# Patient Record
Sex: Female | Born: 1945
Health system: Southern US, Community
[De-identification: ages and names within clinical notes are randomized; demographics above are authoritative.]

## PROBLEM LIST (undated history)

## (undated) DIAGNOSIS — R011 Cardiac murmur, unspecified: Secondary | ICD-10-CM

## (undated) DIAGNOSIS — I739 Peripheral vascular disease, unspecified: Secondary | ICD-10-CM

## (undated) DIAGNOSIS — R32 Unspecified urinary incontinence: Secondary | ICD-10-CM

## (undated) DIAGNOSIS — D013 Carcinoma in situ of anus and anal canal: Secondary | ICD-10-CM

## (undated) DIAGNOSIS — J449 Chronic obstructive pulmonary disease, unspecified: Secondary | ICD-10-CM

## (undated) DIAGNOSIS — K529 Noninfective gastroenteritis and colitis, unspecified: Secondary | ICD-10-CM

## (undated) DIAGNOSIS — H47012 Ischemic optic neuropathy, left eye: Secondary | ICD-10-CM

## (undated) DIAGNOSIS — K222 Esophageal obstruction: Secondary | ICD-10-CM

## (undated) DIAGNOSIS — I700 Atherosclerosis of aorta: Secondary | ICD-10-CM

## (undated) DIAGNOSIS — M1990 Unspecified osteoarthritis, unspecified site: Secondary | ICD-10-CM

## (undated) DIAGNOSIS — K5792 Diverticulitis of intestine, part unspecified, without perforation or abscess without bleeding: Secondary | ICD-10-CM

## (undated) DIAGNOSIS — I10 Essential (primary) hypertension: Secondary | ICD-10-CM

## (undated) DIAGNOSIS — K5790 Diverticulosis of intestine, part unspecified, without perforation or abscess without bleeding: Secondary | ICD-10-CM

## (undated) DIAGNOSIS — R739 Hyperglycemia, unspecified: Secondary | ICD-10-CM

## (undated) DIAGNOSIS — K649 Unspecified hemorrhoids: Secondary | ICD-10-CM

## (undated) DIAGNOSIS — J189 Pneumonia, unspecified organism: Secondary | ICD-10-CM

## (undated) HISTORY — DX: Cardiac murmur, unspecified: R01.1

## (undated) HISTORY — DX: Noninfective gastroenteritis and colitis, unspecified: K52.9

## (undated) HISTORY — DX: Esophageal obstruction: K22.2

## (undated) HISTORY — DX: Unspecified urinary incontinence: R32

## (undated) HISTORY — DX: Diverticulitis of intestine, part unspecified, without perforation or abscess without bleeding: K57.92

## (undated) HISTORY — DX: Hyperglycemia, unspecified: R73.9

## (undated) HISTORY — DX: Diverticulosis of intestine, part unspecified, without perforation or abscess without bleeding: K57.90

## (undated) HISTORY — DX: Chronic obstructive pulmonary disease, unspecified: J44.9

## (undated) HISTORY — PX: ABDOMINAL HYSTERECTOMY: SHX81

## (undated) HISTORY — DX: Carcinoma in situ of anus and anal canal: D01.3

## (undated) HISTORY — DX: Peripheral vascular disease, unspecified: I73.9

## (undated) HISTORY — DX: Unspecified osteoarthritis, unspecified site: M19.90

## (undated) HISTORY — DX: Atherosclerosis of aorta: I70.0

---
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
## 2000-08-23 ENCOUNTER — Emergency Department (HOSPITAL_COMMUNITY): Admission: EM | Admit: 2000-08-23 | Discharge: 2000-08-23 | Payer: Self-pay | Admitting: Emergency Medicine

## 2005-12-14 ENCOUNTER — Ambulatory Visit: Payer: Self-pay | Admitting: Oncology

## 2006-01-29 ENCOUNTER — Ambulatory Visit: Payer: Self-pay | Admitting: Oncology

## 2006-02-03 ENCOUNTER — Ambulatory Visit (HOSPITAL_COMMUNITY): Admission: RE | Admit: 2006-02-03 | Discharge: 2006-02-03 | Payer: Self-pay | Admitting: Oncology

## 2006-02-03 IMAGING — CR DG CHEST 2V
2 series · 2 of 2 positions shown · non-contrast
Comparison: none

CLINICAL DATA: Elevated blood count.
 PA AND LATERAL CHEST ? 2 VIEW:

[view not recorded (1 of 2)]
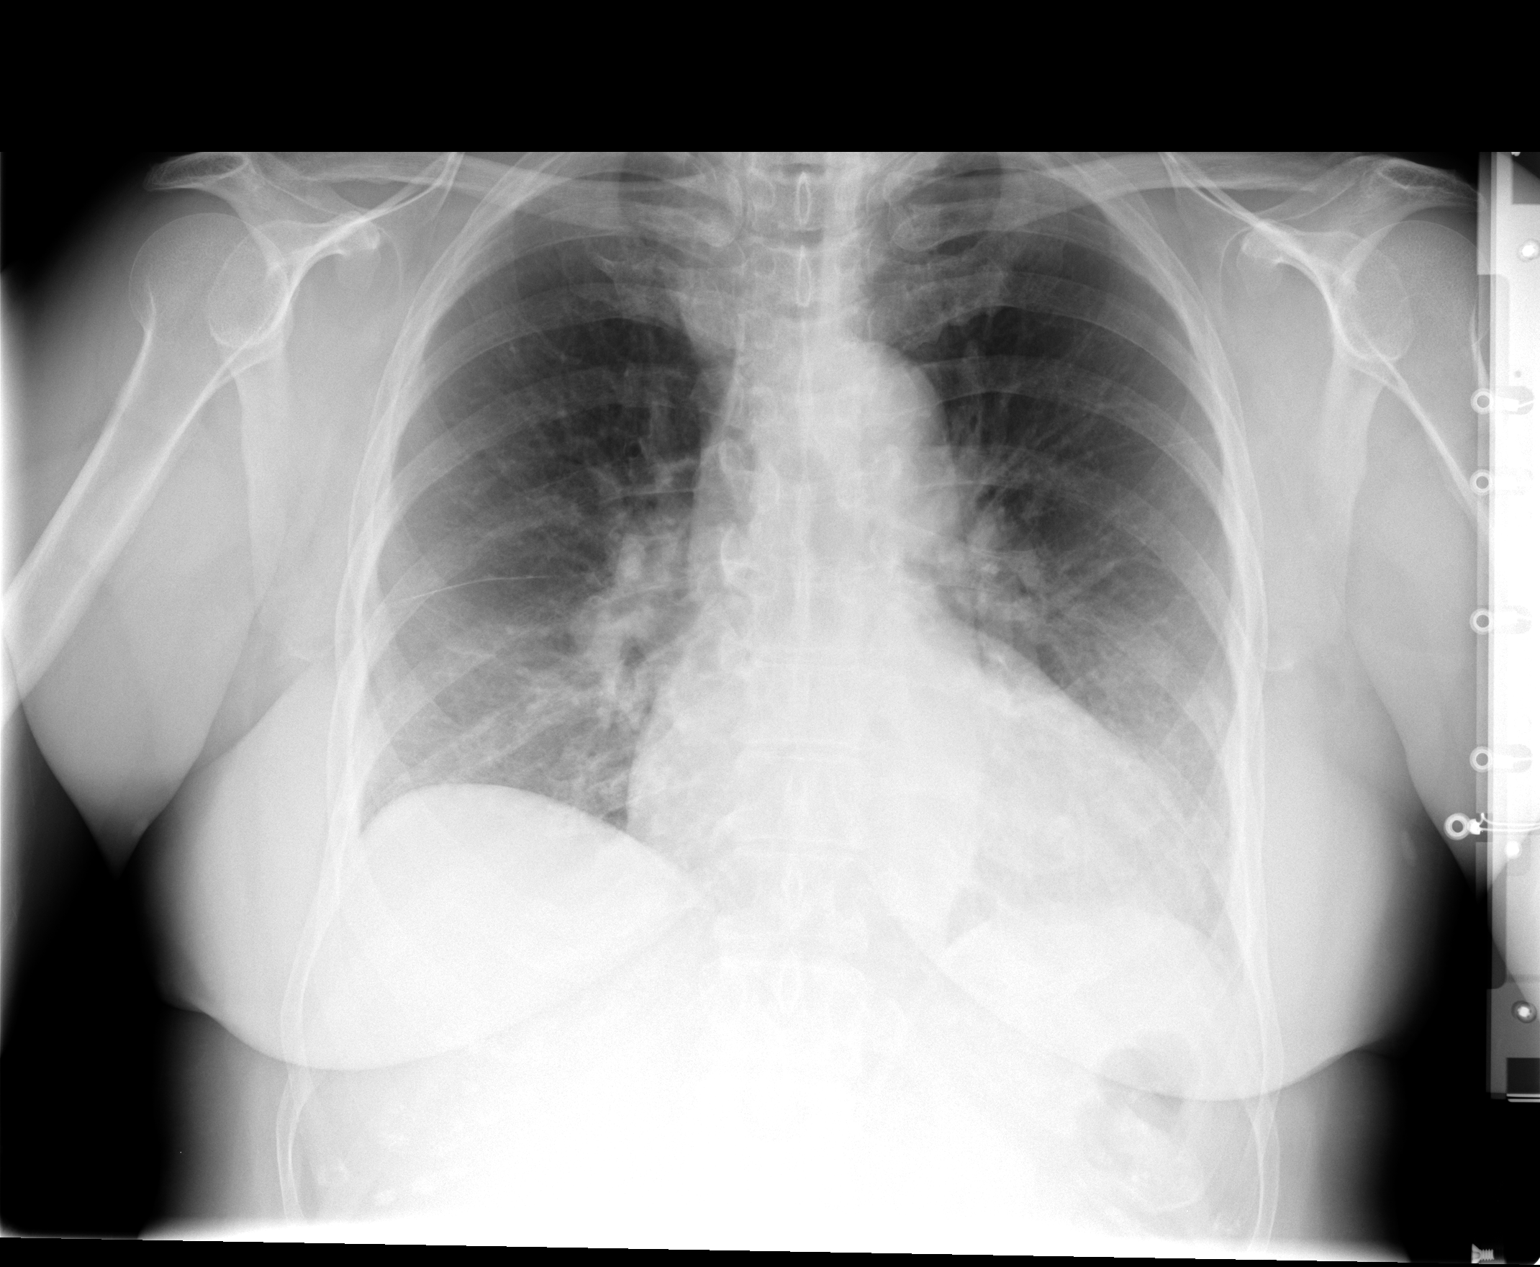

[view not recorded (2 of 2)]
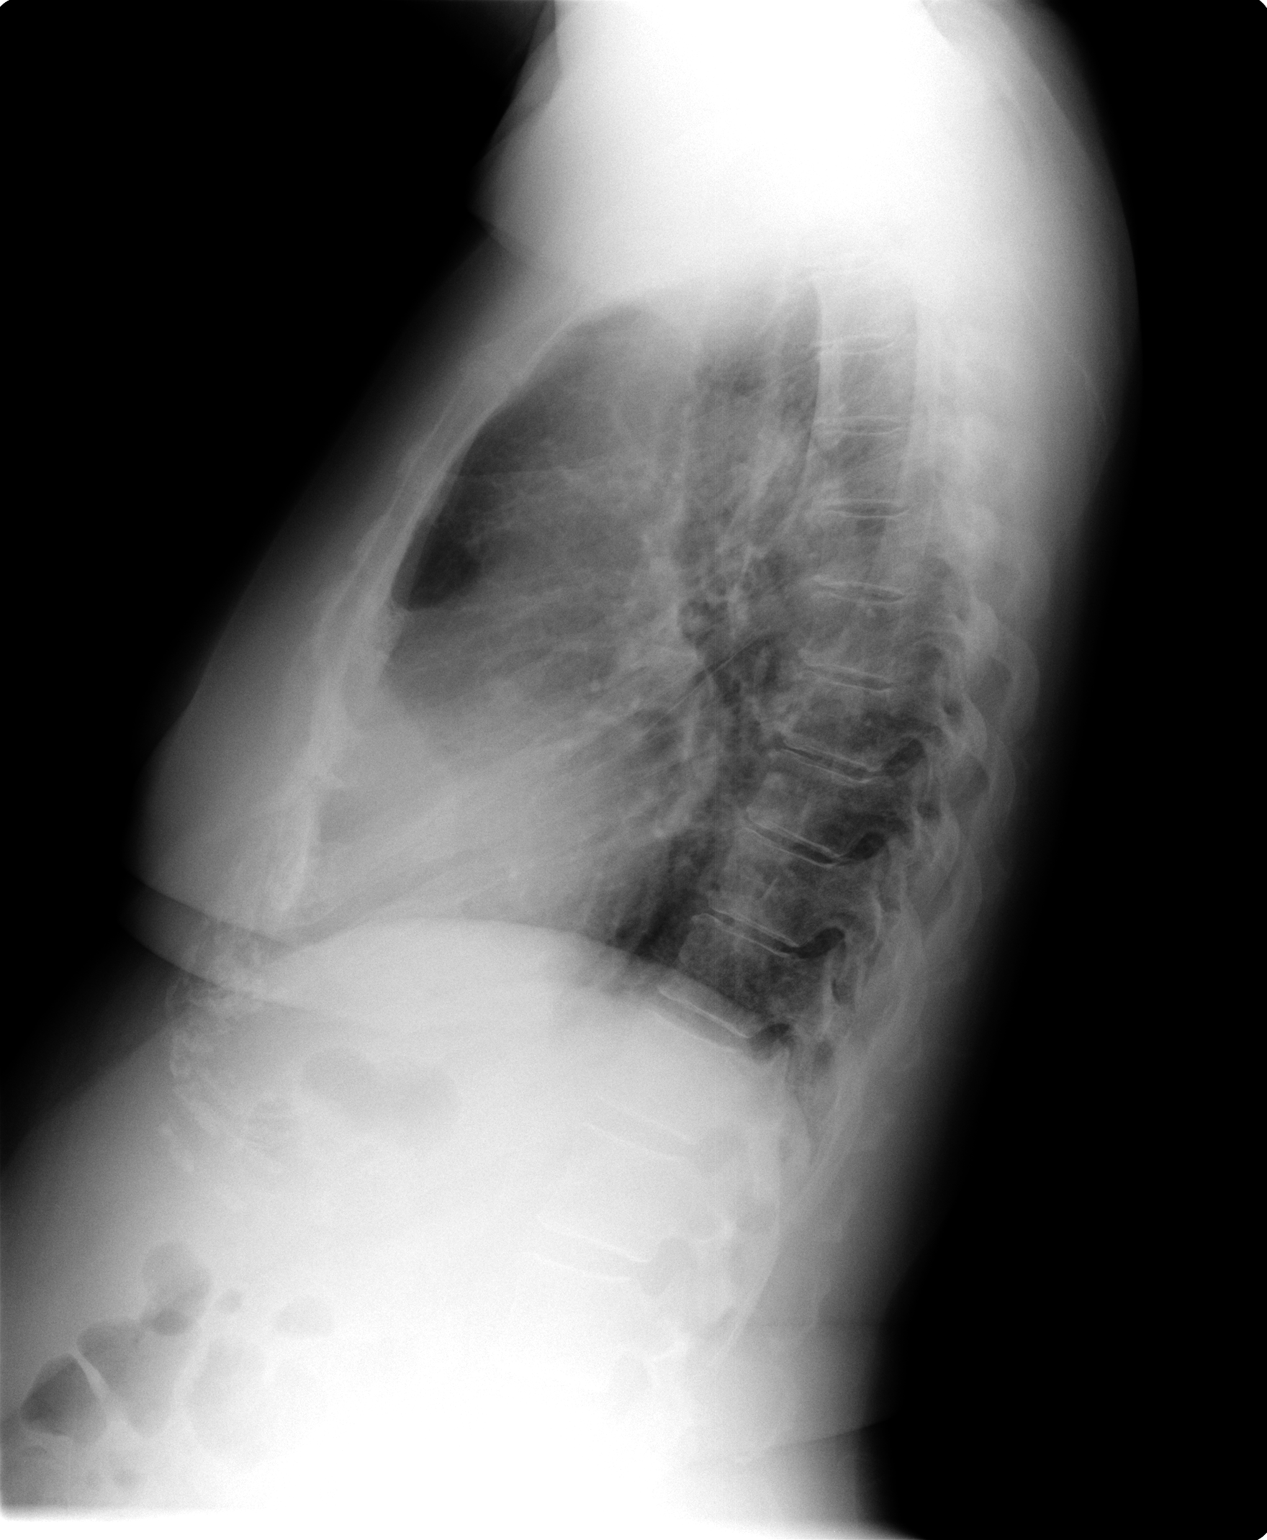

[2 of 2 positions shown; findings below may reference images not displayed]

FINDINGS: There is cardiomegaly with tortuosity of the thoracic aorta.  Pulmonary vascularity is at the upper limits of normal.  There is some accentuation of the interstitial markings at both lung bases.  No discrete consolidative infiltrates or effusions.  I suspect this is some mild chronic interstitial lung disease.  No significant bony abnormality.
IMPRESSION: Cardiomegaly with probable mild chronic interstitial disease at the lung bases.

## 2006-02-03 IMAGING — US US ABDOMEN COMPLETE
1 series · 14 of 25 positions shown · non-contrast
Comparison: none

CLINICAL DATA: Elevated white count.  
 ABDOMEN ULTRASOUND:
TECHNIQUE: Complete abdominal ultrasound examination was performed including evaluation of the liver, gallbladder, bile ducts, pancreas, kidneys, spleen, IVC, and abdominal aorta.

[Series 1: unknown · 0.33mm/px · 14 of 95 slices shown]
[im 1/95]
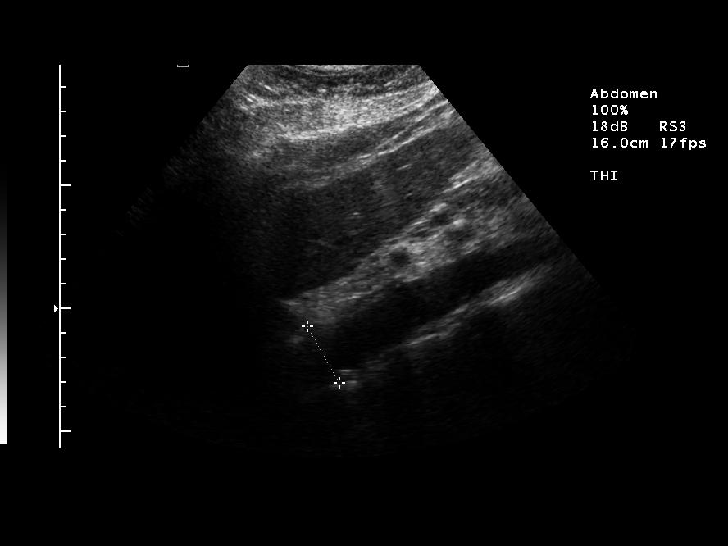
[im 8/95]
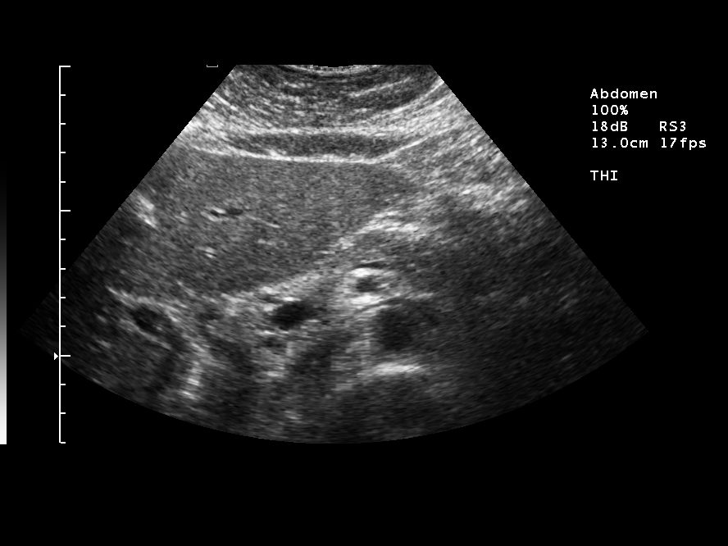
[im 16/95]
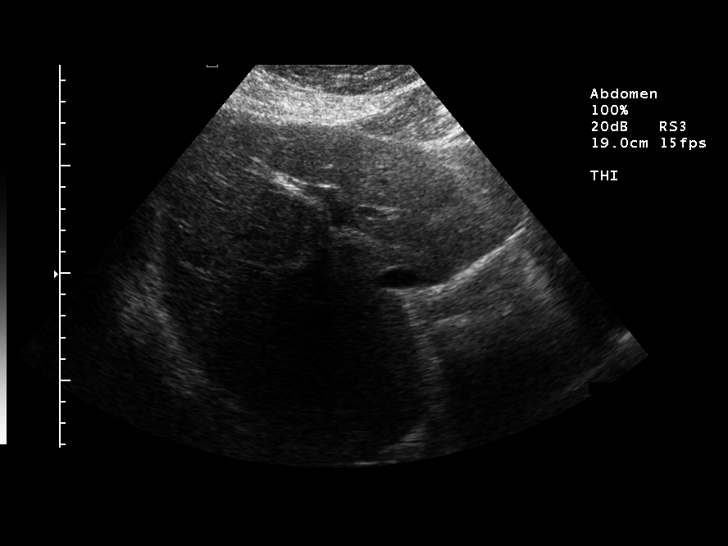
[im 24/95]
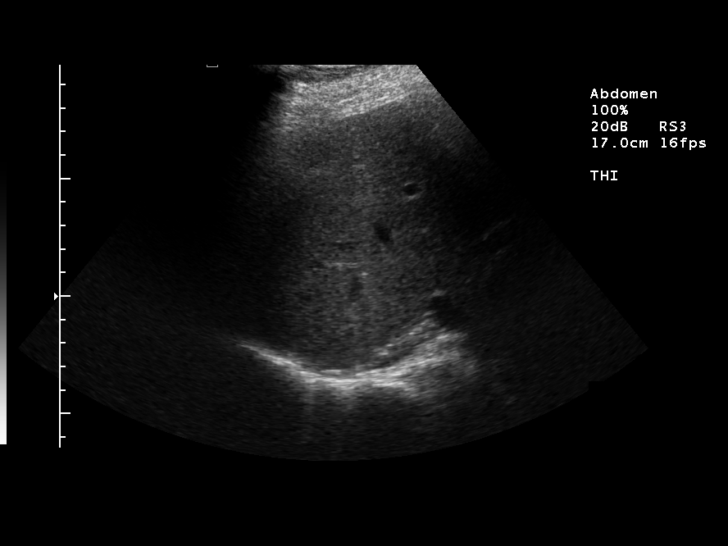
[im 32/95]
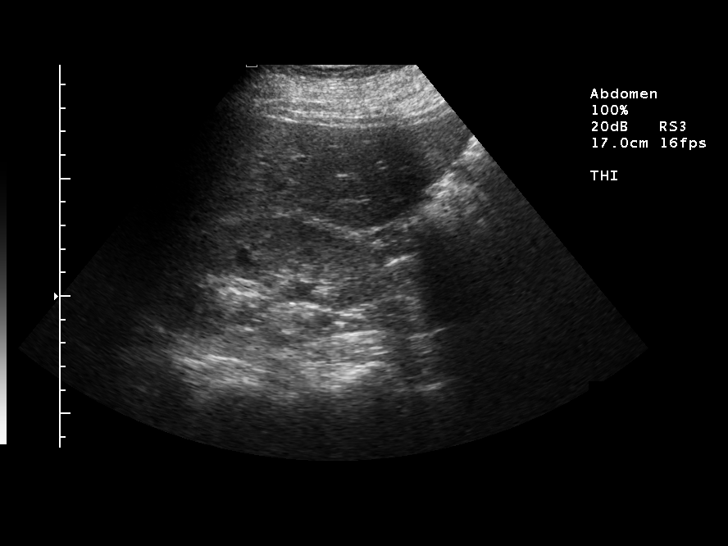
[im 36/95]
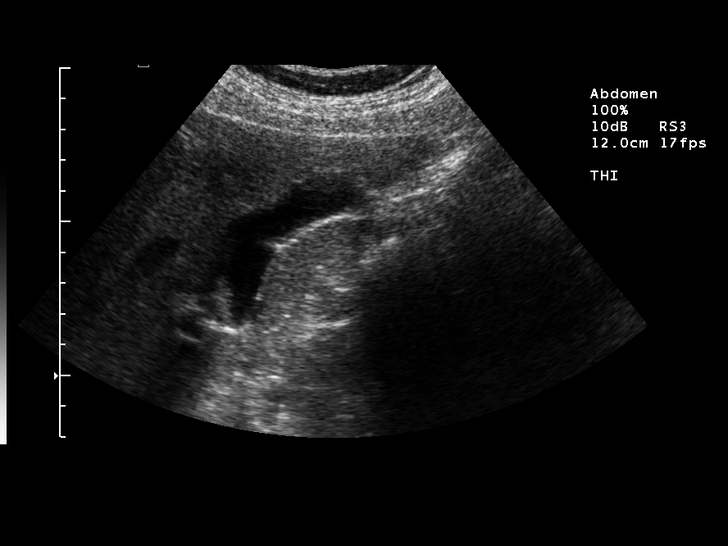
[im 44/95]
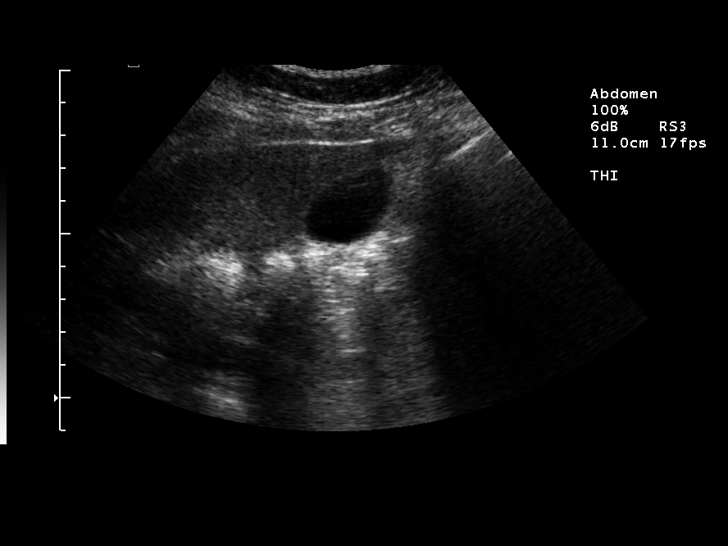
[im 51/95]
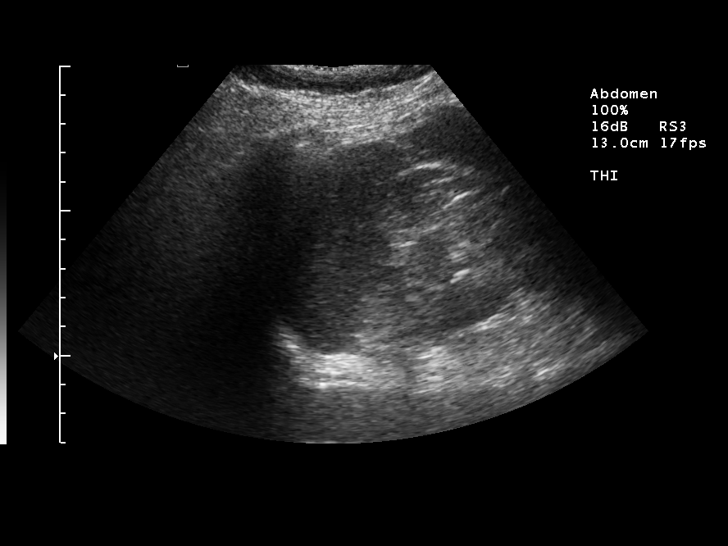
[im 59/95]
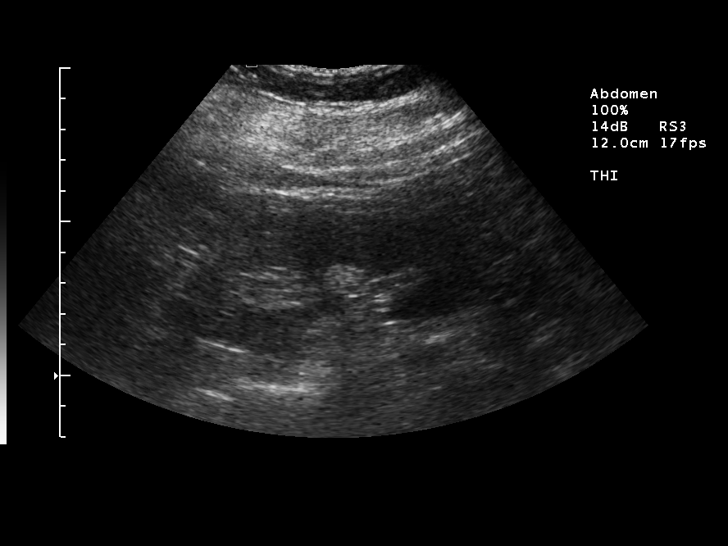
[im 63/95]
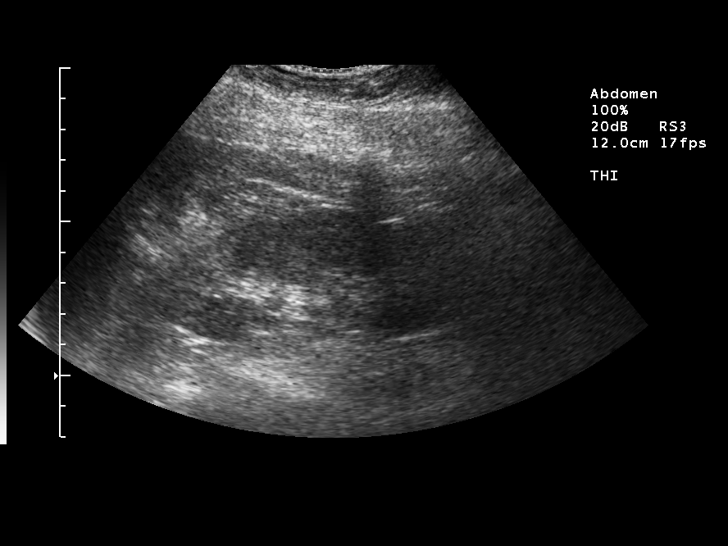
[im 71/95]
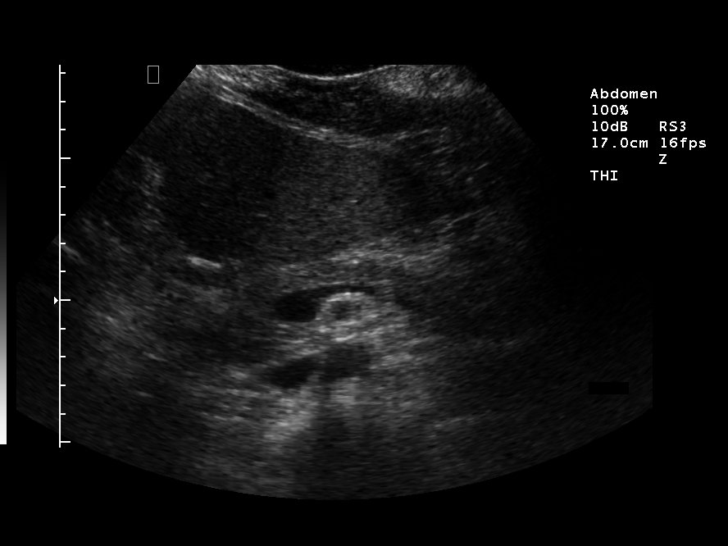
[im 79/95]
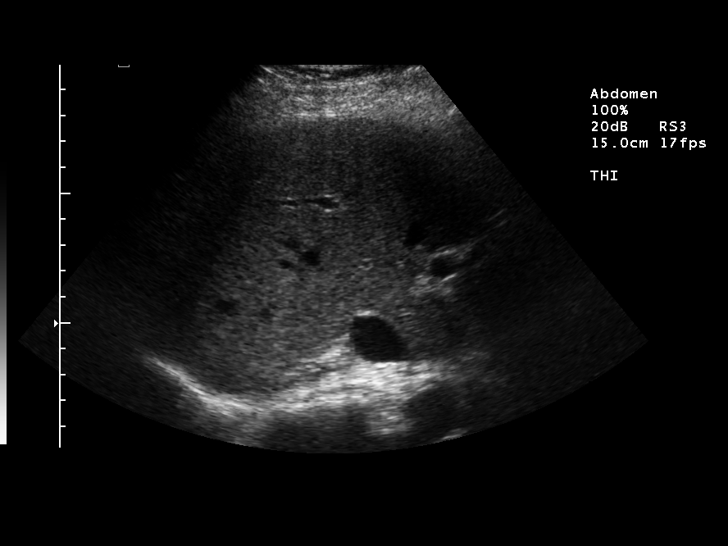
[im 87/95]
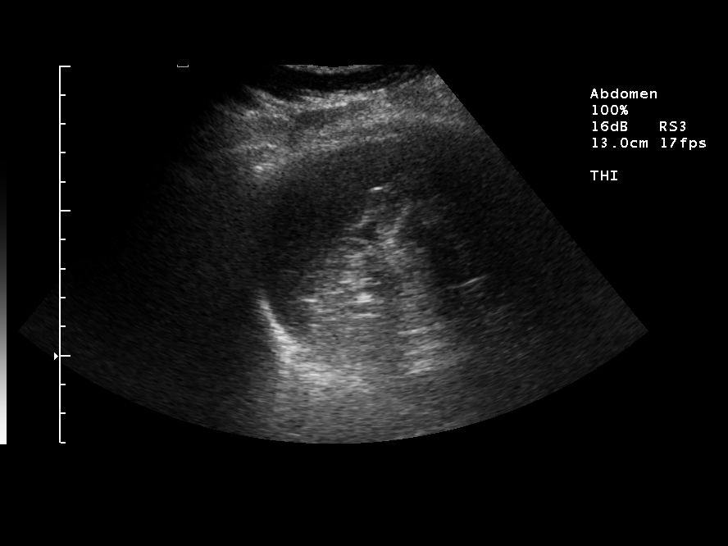
[im 95/95]
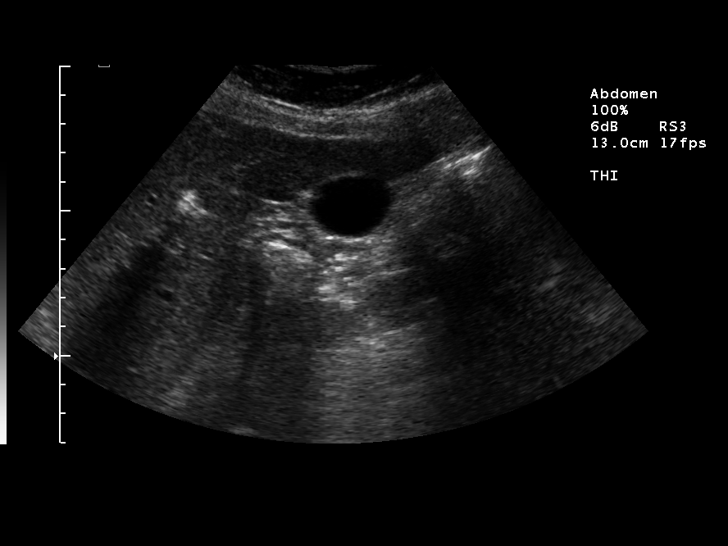

[14 of 25 positions shown; findings below may reference images not displayed]

FINDINGS: There is no evidence of gallstones or biliary ductal dilatation.  The liver is within normal limits in echogenicity, and no focal liver lesions are seen.  The visualized portions of the IVC and pancreas are unremarkable.
 There is no evidence of splenomegaly.  The kidneys are unremarkable, and there is no evidence of hydronephrosis.  The abdominal aorta is non-dilated.
IMPRESSION: Negative abdominal ultrasound.

## 2006-04-29 ENCOUNTER — Ambulatory Visit: Payer: Self-pay | Admitting: Oncology

## 2008-05-10 ENCOUNTER — Encounter: Admission: RE | Admit: 2008-05-10 | Discharge: 2008-05-10 | Payer: Self-pay | Admitting: Orthopedic Surgery

## 2011-01-18 ENCOUNTER — Encounter (HOSPITAL_COMMUNITY): Payer: Self-pay | Admitting: Oncology

## 2011-01-18 ENCOUNTER — Encounter: Payer: Self-pay | Admitting: Orthopedic Surgery

## 2012-04-25 ENCOUNTER — Emergency Department (HOSPITAL_COMMUNITY)
Admission: EM | Admit: 2012-04-25 | Discharge: 2012-04-25 | Disposition: A | Discharge status: Home / Self Care | Payer: Medicare Other | Source: Home / Self Care | Attending: Emergency Medicine | Admitting: Emergency Medicine

## 2012-04-25 ENCOUNTER — Encounter (HOSPITAL_COMMUNITY): Payer: Self-pay

## 2012-04-25 DIAGNOSIS — F17200 Nicotine dependence, unspecified, uncomplicated: Secondary | ICD-10-CM

## 2012-04-25 DIAGNOSIS — J40 Bronchitis, not specified as acute or chronic: Secondary | ICD-10-CM

## 2012-04-25 DIAGNOSIS — Z760 Encounter for issue of repeat prescription: Secondary | ICD-10-CM

## 2012-04-25 HISTORY — DX: Essential (primary) hypertension: I10

## 2012-04-25 MED ORDER — LOSARTAN POTASSIUM-HCTZ 100-12.5 MG PO TABS: 1.0000 | ORAL_TABLET | Freq: Every day | ORAL | Status: DC

## 2012-04-25 MED ORDER — FLUTICASONE PROPIONATE 50 MCG/ACT NA SUSP: 2.0000 | Freq: Every day | NASAL | Status: DC

## 2012-04-25 MED ORDER — DEXAMETHASONE 4 MG PO TABS: 16.0000 mg | ORAL_TABLET | Freq: Every day | ORAL | Status: AC

## 2012-04-25 MED ORDER — ALBUTEROL SULFATE HFA 108 (90 BASE) MCG/ACT IN AERS: 1.0000 | INHALATION_SPRAY | Freq: Four times a day (QID) | RESPIRATORY_TRACT | Status: DC | PRN

## 2012-04-25 MED ORDER — AZITHROMYCIN 250 MG PO TABS: 250.0000 mg | ORAL_TABLET | Freq: Every day | ORAL | Status: AC

## 2012-04-25 MED ORDER — DEXAMETHASONE 4 MG PO TABS: 4.0000 mg | ORAL_TABLET | Freq: Every day | ORAL | Status: DC

## 2012-04-25 MED ORDER — NIFEDIPINE ER OSMOTIC RELEASE 90 MG PO TB24: 90.0000 mg | ORAL_TABLET | Freq: Every day | ORAL | Status: DC

## 2012-04-25 MED ORDER — DEXAMETHASONE 4 MG PO TABS: 8.0000 mg | ORAL_TABLET | Freq: Every day | ORAL | Status: DC

## 2012-04-25 NOTE — ED Provider Notes (Signed)
History     CSN: [id number]  Arrival date & time 04/25/12  1033   First MD Initiated Contact with Patient 04/25/12 1117      Chief Complaint  Patient presents with  . URI    (Consider location/radiation/quality/duration/timing/severity/associated sxs/prior treatment) HPI Comments: Patient with rhinorrhea, nasal congestion, nonproductive cough, wheezing at night for the past week. Patient reports headaches from coughing. States she is unable asleep at night secondary to all the coughing. No other headaches, fevers, sinus pain/pressure, purulent nasal discharge, ear pain, nausea, vomiting, chest pain, DOE, abdominal pain. No aggravating or alleviating factors. She's been taking Mucinex DM and Robitussin without relief. Patient is a long-term smoker.  ROS as noted in HPI. All other ROS negative.   Patient is a 66 y.o. female presenting with cough. The history is provided by the patient. No language interpreter was used.  Cough This is a new problem. The current episode started more than 2 days ago. The problem occurs constantly. The problem has not changed since onset.The cough is non-productive. There has been no fever. Associated symptoms include headaches, rhinorrhea and wheezing. Pertinent negatives include no chest pain, no chills, no sweats, no weight loss, no sore throat, no myalgias and no shortness of breath. She has tried decongestants for the symptoms. The treatment provided mild relief. She is a smoker. Her past medical history does not include bronchitis, pneumonia, COPD, emphysema or asthma.    Past Medical History  Diagnosis Date  . Hypertension     Past Surgical History  Procedure Date  . Abdominal hysterectomy     History reviewed. No pertinent family history.  History  Substance Use Topics  . Smoking status: Current Everyday Smoker -- 1.5 packs/day  . Smokeless tobacco: Not on file  . Alcohol Use: Yes    OB History    Grav Para Term Preterm Abortions TAB  SAB Ect Mult Living                  Review of Systems  Constitutional: Negative for chills and weight loss.  HENT: Positive for rhinorrhea. Negative for sore throat.   Respiratory: Positive for cough and wheezing. Negative for shortness of breath.   Cardiovascular: Negative for chest pain.  Musculoskeletal: Negative for myalgias.  Neurological: Positive for headaches.    Allergies  Review of patient's allergies indicates no known allergies.  Home Medications   Current Outpatient Rx  Name Route Sig Dispense Refill  . DM-GUAIFENESIN ER 30-600 MG PO TB12 Oral Take 1 tablet by mouth every 12 (twelve) hours.    . ALBUTEROL SULFATE HFA 108 (90 BASE) MCG/ACT IN AERS Inhalation Inhale 1-2 puffs into the lungs every 6 (six) hours as needed for wheezing. 1 Inhaler 0  . AZITHROMYCIN 250 MG PO TABS Oral Take 1 tablet (250 mg total) by mouth daily. 2 tabs po on day one, then one tablet po once daily on days 2-5. 6 tablet 0  . DEXAMETHASONE 4 MG PO TABS Oral Take 4 tablets (16 mg total) by mouth daily. 4 tablets po at once on day 1 and 4 tabs po at once on day 2 8 tablet 0  . FLUTICASONE PROPIONATE 50 MCG/ACT NA SUSP Nasal Place 2 sprays into the nose daily. 16 g 2  . LOSARTAN POTASSIUM-HCTZ 100-12.5 MG PO TABS Oral Take 1 tablet by mouth daily. 30 tablet 0  . NIFEDIPINE ER OSMOTIC 90 MG PO TB24 Oral Take 1 tablet (90 mg total) by mouth daily. 30 tablet 0  BP 148/73  Pulse 72  Temp(Src) 98.7 F (37.1 C) (Oral)  Resp 16  SpO2 97%  Physical Exam  Nursing note and vitals reviewed. Constitutional: She is oriented to person, place, and time. She appears well-developed and well-nourished.  HENT:  Head: Normocephalic and atraumatic.  Right Ear: Tympanic membrane and ear canal normal.  Left Ear: Tympanic membrane and ear canal normal.  Nose: Mucosal edema and rhinorrhea present. No epistaxis.  Mouth/Throat: Uvula is midline and mucous membranes are normal. Posterior oropharyngeal erythema  present. No oropharyngeal exudate.       (-) frontal, maxillary sinus tenderness  Eyes: Conjunctivae and EOM are normal.  Neck: Normal range of motion. Neck supple.  Cardiovascular: Normal rate, regular rhythm, normal heart sounds and intact distal pulses.   Pulmonary/Chest: Effort normal. No respiratory distress. She exhibits no tenderness.       Scattered Rhonchi that clear with coughing  Abdominal: Soft. Bowel sounds are normal. She exhibits no distension.  Musculoskeletal: Normal range of motion.  Lymphadenopathy:    She has no cervical adenopathy.  Neurological: She is alert and oriented to person, place, and time.  Skin: Skin is warm and dry. No rash noted.  Psychiatric: She has a normal mood and affect. Her behavior is normal. Judgment and thought content normal.    ED Course  Procedures (including critical care time)  Labs Reviewed - No data to display No results found.   1. Bronchitis   2. Tobacco dependence   3. Medication refill       MDM  Patient has rhonchi upper lobes. Afebrile here, no history of fever. Deferring x-ray. Suspect bronchitis, especially with a long history smoking. Sending home with antibiotics, bronchodilators, short course of steroids. Referring her primary care resources. Patient also requesting a medication refill of the Hyzaar and Procardia XL. She is about to run out in several days. Blood pressure acceptable here. Will give her a month's worth. Counseled patient about the importance of smoking cessation. We'll refer her to smoking cessation specialist. Patient agrees with plan.     Luiz Blare, MD 04/25/12 1440

## 2012-04-25 NOTE — ED Notes (Addendum)
Cough, congestion, HA for past week

## 2012-04-25 NOTE — Discharge Instructions (Signed)
Take two puffs from your albuterol inhaler every 4 hours. Finish the steroids unless your doctor tells you to stop. Finish the antibiotics, even if you feel better. You may take tylenol 1 gram up to 4 times a day as needed for pain. This with 600 mg of motrin is an effective combination for pain and fever. Make sure you drink extra fluids. Return if you get worse, have a fever >100.4, or any other concerns.   Go to www.goodrx.com to look up your medications. This will give you a list of where you can find your prescriptions at the most affordable prices.

## 2012-08-15 ENCOUNTER — Encounter: Payer: Self-pay | Admitting: Internal Medicine

## 2012-08-30 ENCOUNTER — Other Ambulatory Visit: Payer: Self-pay | Admitting: Family Medicine

## 2012-09-02 ENCOUNTER — Encounter: Payer: Self-pay | Admitting: Internal Medicine

## 2012-09-02 DIAGNOSIS — I10 Essential (primary) hypertension: Secondary | ICD-10-CM | POA: Insufficient documentation

## 2012-09-02 DIAGNOSIS — Z0001 Encounter for general adult medical examination with abnormal findings: Secondary | ICD-10-CM | POA: Insufficient documentation

## 2012-09-02 DIAGNOSIS — I739 Peripheral vascular disease, unspecified: Secondary | ICD-10-CM | POA: Insufficient documentation

## 2012-09-08 ENCOUNTER — Ambulatory Visit (INDEPENDENT_AMBULATORY_CARE_PROVIDER_SITE_OTHER): Payer: Medicare Other | Admitting: Internal Medicine

## 2012-09-08 ENCOUNTER — Other Ambulatory Visit (INDEPENDENT_AMBULATORY_CARE_PROVIDER_SITE_OTHER): Payer: Medicare Other

## 2012-09-08 ENCOUNTER — Encounter: Payer: Self-pay | Admitting: Internal Medicine

## 2012-09-08 VITALS — BP 138/64 | HR 78 | Temp 98.4°F | Ht 62.0 in | Wt 178.8 lb

## 2012-09-08 DIAGNOSIS — I10 Essential (primary) hypertension: Secondary | ICD-10-CM

## 2012-09-08 DIAGNOSIS — G4700 Insomnia, unspecified: Secondary | ICD-10-CM

## 2012-09-08 DIAGNOSIS — E785 Hyperlipidemia, unspecified: Secondary | ICD-10-CM | POA: Insufficient documentation

## 2012-09-08 DIAGNOSIS — Z79899 Other long term (current) drug therapy: Secondary | ICD-10-CM

## 2012-09-08 DIAGNOSIS — I359 Nonrheumatic aortic valve disorder, unspecified: Secondary | ICD-10-CM

## 2012-09-08 DIAGNOSIS — F17200 Nicotine dependence, unspecified, uncomplicated: Secondary | ICD-10-CM

## 2012-09-08 DIAGNOSIS — M1990 Unspecified osteoarthritis, unspecified site: Secondary | ICD-10-CM | POA: Insufficient documentation

## 2012-09-08 DIAGNOSIS — R0989 Other specified symptoms and signs involving the circulatory and respiratory systems: Secondary | ICD-10-CM | POA: Insufficient documentation

## 2012-09-08 DIAGNOSIS — F17210 Nicotine dependence, cigarettes, uncomplicated: Secondary | ICD-10-CM | POA: Insufficient documentation

## 2012-09-08 DIAGNOSIS — I358 Other nonrheumatic aortic valve disorders: Secondary | ICD-10-CM | POA: Insufficient documentation

## 2012-09-08 DIAGNOSIS — Z0000 Encounter for general adult medical examination without abnormal findings: Secondary | ICD-10-CM

## 2012-09-08 DIAGNOSIS — Z136 Encounter for screening for cardiovascular disorders: Secondary | ICD-10-CM

## 2012-09-08 DIAGNOSIS — M169 Osteoarthritis of hip, unspecified: Secondary | ICD-10-CM | POA: Insufficient documentation

## 2012-09-08 DIAGNOSIS — I739 Peripheral vascular disease, unspecified: Secondary | ICD-10-CM

## 2012-09-08 LAB — CBC WITH DIFFERENTIAL/PLATELET
Basophils Absolute: 0.1 10*3/uL (ref 0.0–0.1)
Basophils Relative: 0.6 % (ref 0.0–3.0)
Eosinophils Absolute: 0.4 10*3/uL (ref 0.0–0.7)
Eosinophils Relative: 2.7 % (ref 0.0–5.0)
HCT: 39 % (ref 36.0–46.0)
Hemoglobin: 12.2 g/dL (ref 12.0–15.0)
Lymphocytes Relative: 18.3 % (ref 12.0–46.0)
Lymphs Abs: 2.4 10*3/uL (ref 0.7–4.0)
MCHC: 31.2 g/dL (ref 30.0–36.0)
MCV: 75.5 fl — ABNORMAL LOW (ref 78.0–100.0)
Monocytes Absolute: 0.7 10*3/uL (ref 0.1–1.0)
Monocytes Relative: 4.9 % (ref 3.0–12.0)
Neutro Abs: 9.8 10*3/uL — ABNORMAL HIGH (ref 1.4–7.7)
Neutrophils Relative %: 73.5 % (ref 43.0–77.0)
Platelets: 472 10*3/uL — ABNORMAL HIGH (ref 150.0–400.0)
RBC: 5.16 Mil/uL — ABNORMAL HIGH (ref 3.87–5.11)
RDW: 17.4 % — ABNORMAL HIGH (ref 11.5–14.6)
WBC: 13.4 10*3/uL — ABNORMAL HIGH (ref 4.5–10.5)

## 2012-09-08 LAB — BASIC METABOLIC PANEL
BUN: 26 mg/dL — ABNORMAL HIGH (ref 6–23)
CO2: 25 mEq/L (ref 19–32)
Calcium: 9.5 mg/dL (ref 8.4–10.5)
Chloride: 106 mEq/L (ref 96–112)
Creatinine, Ser: 1.3 mg/dL — ABNORMAL HIGH (ref 0.4–1.2)
GFR: 54.72 mL/min — ABNORMAL LOW (ref >60.00)
Glucose, Bld: 97 mg/dL (ref 70–99)
Potassium: 4.1 mEq/L (ref 3.5–5.1)
Sodium: 141 mEq/L (ref 135–145)

## 2012-09-08 LAB — URINALYSIS, ROUTINE W REFLEX MICROSCOPIC
Bilirubin Urine: NEGATIVE
Hgb urine dipstick: NEGATIVE
Ketones, ur: NEGATIVE
Leukocytes, UA: NEGATIVE
Nitrite: NEGATIVE
Specific Gravity, Urine: 1.02 (ref 1.000–1.030)
Urine Glucose: NEGATIVE
Urobilinogen, UA: 0.2 (ref 0.0–1.0)
pH: 6 (ref 5.0–8.0)

## 2012-09-08 LAB — LIPID PANEL
Cholesterol: 175 mg/dL (ref 0–200)
HDL: 38.8 mg/dL — ABNORMAL LOW (ref >39.00)
LDL Cholesterol: 108 mg/dL — ABNORMAL HIGH (ref 0–99)
Total CHOL/HDL Ratio: 5
Triglycerides: 143 mg/dL (ref 0.0–149.0)
VLDL: 28.6 mg/dL (ref 0.0–40.0)

## 2012-09-08 LAB — HEPATIC FUNCTION PANEL
ALT: 15 U/L (ref 0–35)
AST: 15 U/L (ref 0–37)
Albumin: 3.9 g/dL (ref 3.5–5.2)
Alkaline Phosphatase: 71 U/L (ref 39–117)
Bilirubin, Direct: 0.1 mg/dL (ref 0.0–0.3)
Total Bilirubin: 0.4 mg/dL (ref 0.3–1.2)
Total Protein: 8 g/dL (ref 6.0–8.3)

## 2012-09-08 LAB — TSH: TSH: 0.99 u[IU]/mL (ref 0.35–5.50)

## 2012-09-08 MED ORDER — LOSARTAN POTASSIUM-HCTZ 50-12.5 MG PO TABS: 1.0000 | ORAL_TABLET | Freq: Every day | ORAL | Status: DC

## 2012-09-08 MED ORDER — NIFEDIPINE ER OSMOTIC RELEASE 90 MG PO TB24: 90.0000 mg | ORAL_TABLET | Freq: Every day | ORAL | Status: DC

## 2012-09-08 MED ORDER — VARENICLINE TARTRATE 0.5 MG X 11 & 1 MG X 42 PO MISC: ORAL | Status: AC

## 2012-09-08 MED ORDER — VARENICLINE TARTRATE 0.5 MG X 11 & 1 MG X 42 PO MISC: ORAL | Status: DC

## 2012-09-08 MED ORDER — VARENICLINE TARTRATE 1 MG PO TABS: 1.0000 mg | ORAL_TABLET | Freq: Two times a day (BID) | ORAL | Status: AC

## 2012-09-08 MED ORDER — ASPIRIN 81 MG PO TBEC: 81.0000 mg | DELAYED_RELEASE_TABLET | Freq: Every day | ORAL | Status: AC

## 2012-09-08 MED ORDER — TRAZODONE HCL 50 MG PO TABS: 25.0000 mg | ORAL_TABLET | Freq: Every evening | ORAL | Status: DC | PRN

## 2012-09-08 NOTE — Assessment & Plan Note (Addendum)
Suspect dry rales by exam, no symptoms, and prior cxr with bibas scarring ., will hold on repeat cxr today

## 2012-09-08 NOTE — Patient Instructions (Addendum)
Please start Aspirin at 81 mg - 1 per day, COATED only Take all new medications as prescribed - the chantix and sleep medication Continue all other medications as before, and they were refilled today Please have the pharmacy call with any refills you may need. Please remember to followup with your GYN for the yearly pap smear and/or mammogram Please return if you change your mind about the flu and other shots Please go to LAB in the Basement for the blood and/or urine tests to be done today You will be contacted by phone if any changes need to be made immediately.  Otherwise, you will receive a letter about your results with an explanation. You will be contacted regarding the referral for: Leg artery ultrasound to check on your leg circulation, as well as the Echocardiogram (heart ultrasound to check on the heart murmur) - these tests have NO pain Please keep your appointments with your specialists as you have planned - your colonoscopy with Dr Marina Goodell soon Please return in 6 months, or sooner if needed

## 2012-09-08 NOTE — Assessment & Plan Note (Signed)
Last doppers 2009, with ? Pain - for f/u dopplers today,

## 2012-09-08 NOTE — Assessment & Plan Note (Signed)
Ok for chantix  

## 2012-09-08 NOTE — Assessment & Plan Note (Signed)

## 2012-09-08 NOTE — Assessment & Plan Note (Signed)
Sounds like tx with statin about 4 yrs ago, then she stopped - for lipids today, goal ldl < 70

## 2012-09-09 ENCOUNTER — Encounter: Payer: Self-pay | Admitting: Internal Medicine

## 2012-09-09 ENCOUNTER — Other Ambulatory Visit: Payer: Self-pay | Admitting: Internal Medicine

## 2012-09-09 DIAGNOSIS — R0689 Other abnormalities of breathing: Secondary | ICD-10-CM

## 2012-09-10 NOTE — Progress Notes (Signed)
Subjective:    Patient ID: Elaine Turner, female    DOB: [birth year], 66 y.o.   MRN: [id number]  HPI  Here for wellness and f/u;  Overall doing ok;  Pt denies CP, worsening SOB, DOE, wheezing, orthopnea, PND, worsening LE edema, palpitations, dizziness or syncope.  Pt denies neurological change such as new Headache, facial or extremity weakness.  Pt denies polydipsia, polyuria, or low sugar symptoms. Pt states overall good compliance with treatment and medications, good tolerability, and trying to follow lower cholesterol diet.  Pt denies worsening depressive symptoms, suicidal ideation or panic. No fever, wt loss, night sweats, loss of appetite, or other constitutional symptoms.  Pt states good ability with ADL's, low fall risk, home safety reviewed and adequate, no significant changes in hearing or vision, and occasionally active with exercise.  Has mild swelling LE's better in the AM left > right.  Cont's to smoke, wants to quit.  Has ongoing insomnia, hard to get to sleep.  Has known heart murmur, now well characterized in the past.   Past Medical History  Diagnosis Date  . Hypertension   . PVD (peripheral vascular disease)   . Arthritis   . Heart murmur   . Urine incontinence    Past Surgical History  Procedure Date  . Abdominal hysterectomy     reports that she has been smoking.  She has never used smokeless tobacco. She reports that she drinks alcohol. She reports that she does not use illicit drugs. family history is not on file. Family history is unknown by patient. Allergies  Allergen Reactions  . Ace Inhibitors    Current Outpatient Prescriptions on File Prior to Visit  Medication Sig Dispense Refill  . NIFEdipine (PROCARDIA XL) 90 MG 24 hr tablet Take 1 tablet (90 mg total) by mouth daily.  90 tablet  3  . dextromethorphan-guaiFENesin (MUCINEX DM) 30-600 MG per 12 hr tablet Take 1 tablet by mouth every 12 (twelve) hours.      . traZODone (DESYREL) 50 MG tablet Take 0.5-1 tablets  (25-50 mg total) by mouth at bedtime as needed for sleep.  90 tablet  1   Review of Systems Review of Systems  Constitutional: Negative for diaphoresis, activity change, appetite change and unexpected weight change.  HENT: Negative for hearing loss, ear pain, facial swelling, mouth sores and neck stiffness.   Eyes: Negative for pain, redness and visual disturbance.  Respiratory: Negative for shortness of breath and wheezing.   Cardiovascular: Negative for chest pain and palpitations.  Gastrointestinal: Negative for diarrhea, blood in stool, abdominal distention and rectal pain.  Genitourinary: Negative for hematuria, flank pain and decreased urine volume.  Musculoskeletal: Negative for myalgias and joint swelling.  Skin: Negative for color change and wound.  Neurological: Negative for syncope and numbness.  Hematological: Negative for adenopathy.  Psychiatric/Behavioral: Negative for hallucinations, self-injury, decreased concentration and agitation.      Objective:   Physical Exam BP 138/64  Pulse 78  Temp 98.4 F (36.9 C) (Oral)  Ht 5\' 2"  (1.575 m)  Wt 178 lb 12 oz (81.08 kg)  BMI 32.69 kg/m2  SpO2 95% Physical Exam  VS noted Constitutional: Pt is oriented to person, place, and time. Appears well-developed and well-nourished.  HENT:  Head: Normocephalic and atraumatic.  Right Ear: External ear normal.  Left Ear: External ear normal.  Nose: Nose normal.  Mouth/Throat: Oropharynx is clear and moist.  Eyes: Conjunctivae and EOM are normal. Pupils are equal, round, and reactive to light.  Neck:  Normal range of motion. Neck supple. No JVD present. No tracheal deviation present.  Cardiovascular: Normal rate, regular rhythm, normal heart sounds and intact distal pulses.  with gr 2/6 sys Murmur LUSB Pulmonary/Chest: Effort normal and breath sounds normal.  Abdominal: Soft. Bowel sounds are normal. There is no tenderness.  Musculoskeletal: Normal range of motion. Exhibits no edema.    Lymphadenopathy:  Has no cervical adenopathy.  Neurological: Pt is alert and oriented to person, place, and time. Pt has normal reflexes. No cranial nerve deficit.  Skin: Skin is warm and dry. No rash noted.  Psychiatric:  Has  normal mood and affect. Behavior is normal.     Assessment & Plan:

## 2012-09-10 NOTE — Assessment & Plan Note (Signed)
For echo to further eval, ECG reviewed as per emr

## 2012-09-10 NOTE — Assessment & Plan Note (Signed)
Does not want ambien, ok for trazodone qhs prn

## 2012-09-10 NOTE — Assessment & Plan Note (Signed)
stable overall by hx and exam, most recent data reviewed with pt, and pt to continue medical treatment as before BP Readings from Last 3 Encounters:  09/08/12 138/64  04/25/12 148/73

## 2012-09-12 ENCOUNTER — Ambulatory Visit (INDEPENDENT_AMBULATORY_CARE_PROVIDER_SITE_OTHER)
Admission: RE | Admit: 2012-09-12 | Discharge: 2012-09-12 | Disposition: A | Discharge status: Home / Self Care | Payer: Medicare Other | Source: Ambulatory Visit | Attending: Internal Medicine | Admitting: Internal Medicine

## 2012-09-12 ENCOUNTER — Encounter: Payer: Self-pay | Admitting: Internal Medicine

## 2012-09-12 DIAGNOSIS — R0689 Other abnormalities of breathing: Secondary | ICD-10-CM

## 2012-09-12 DIAGNOSIS — R0989 Other specified symptoms and signs involving the circulatory and respiratory systems: Secondary | ICD-10-CM

## 2012-09-12 IMAGING — CR DG CHEST 2V
2 series · 2 of 2 positions shown · non-contrast
Comparison: Prior chest x-ray [DATE]

CLINICAL DATA: Bibasilar rales, elevated white blood cell count

CHEST - 2 VIEW

[view not recorded (1 of 2)]
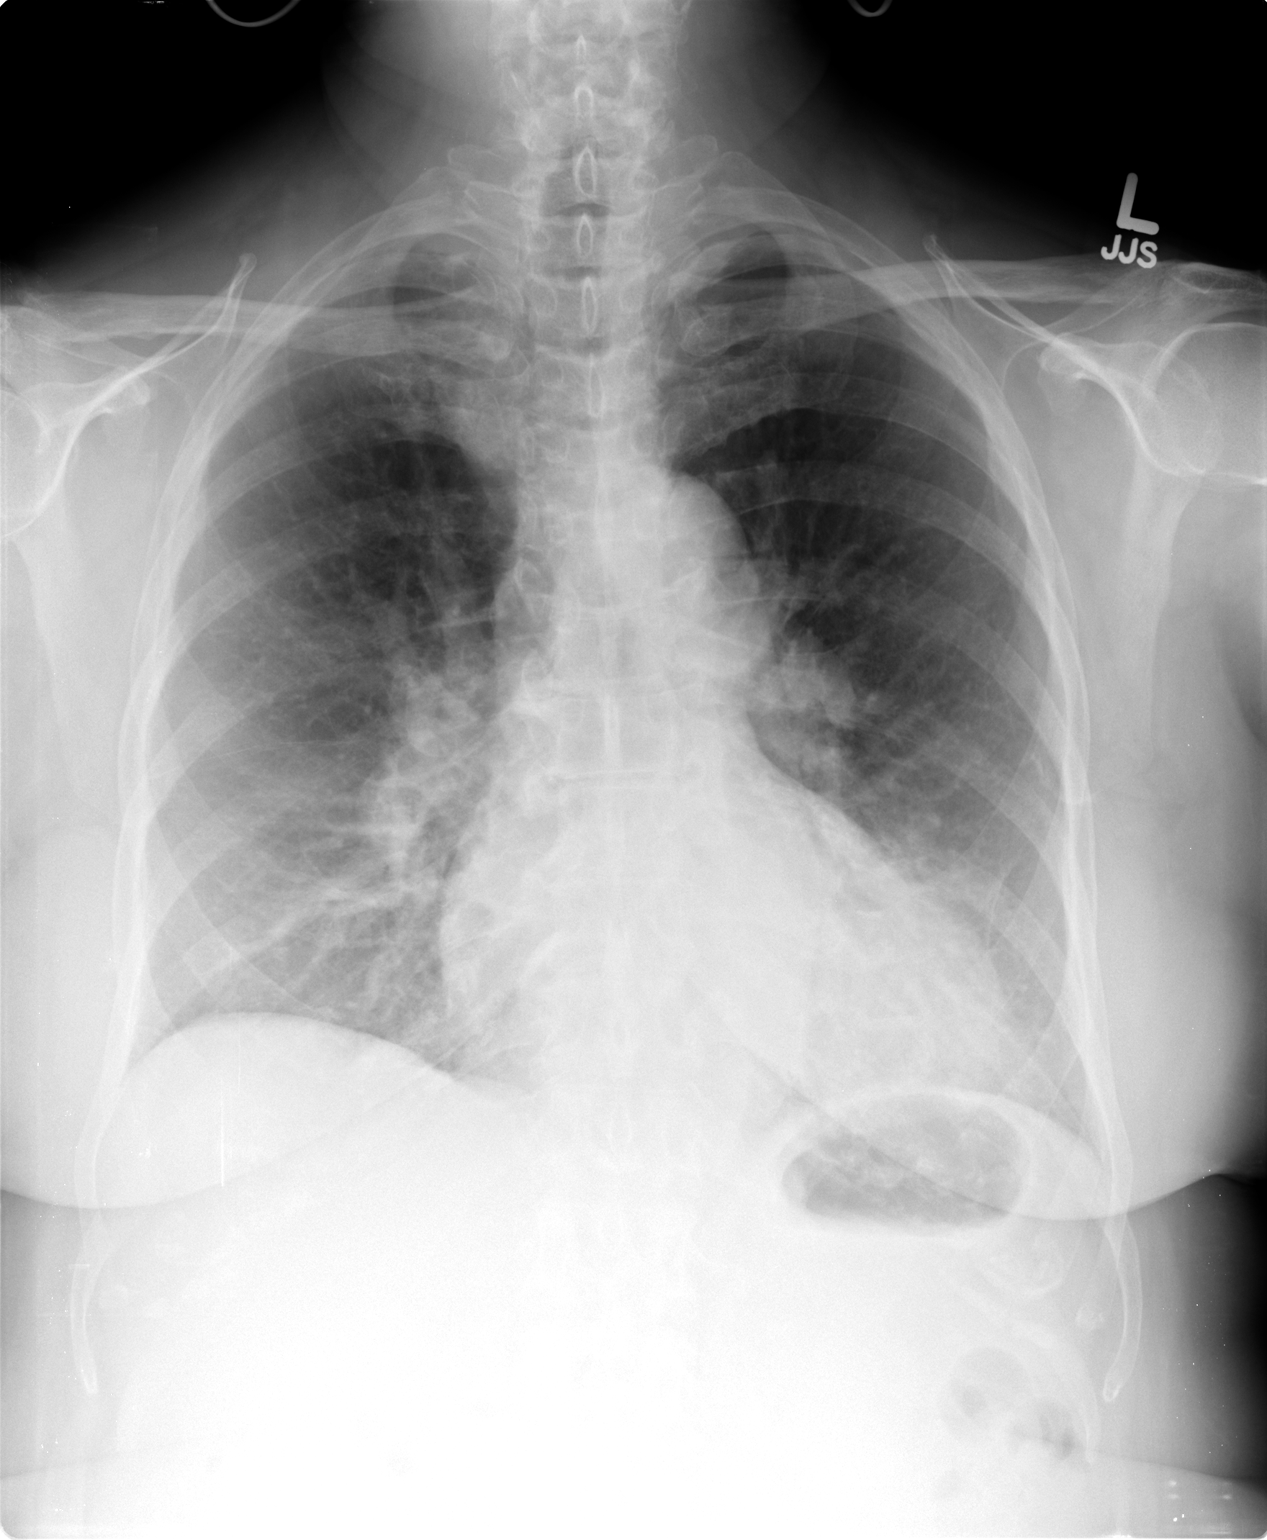

[view not recorded (2 of 2)]
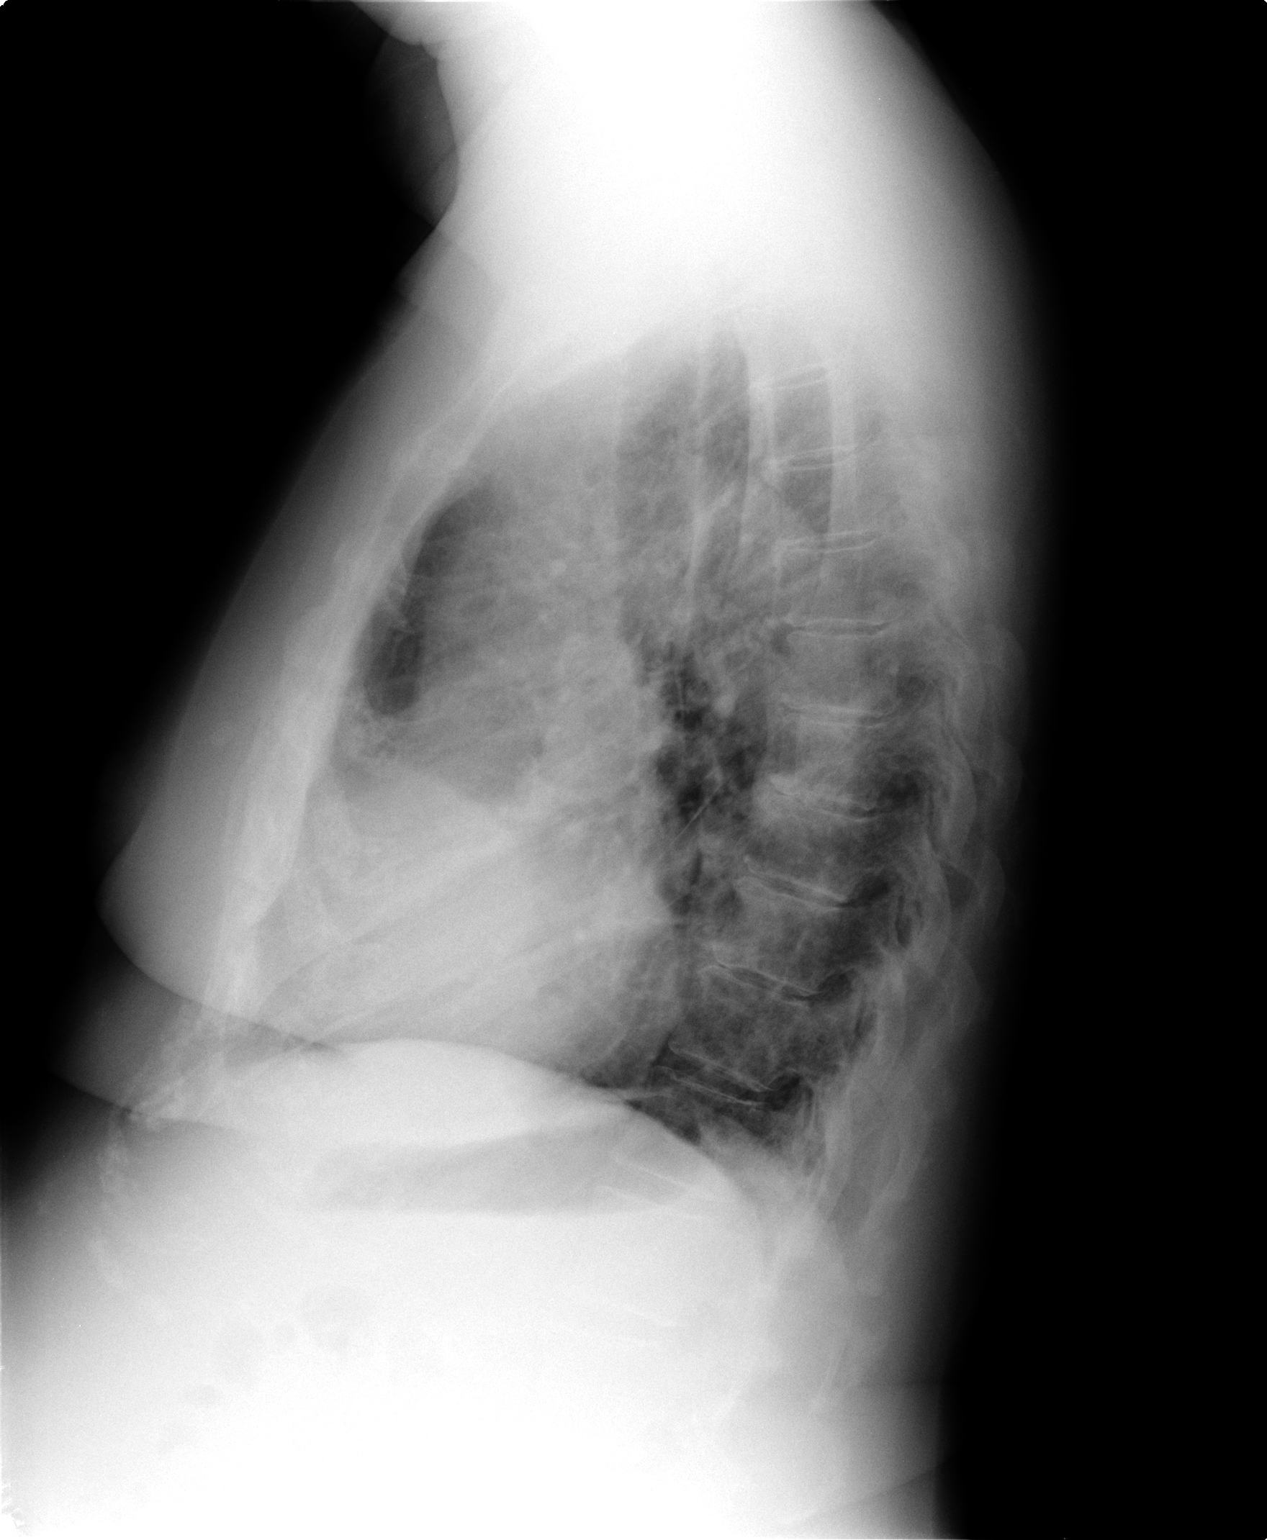

[2 of 2 positions shown; findings below may reference images not displayed]

FINDINGS: Enlargement of the main and central pulmonary arteries
and generalized pulmonary vascular congestion.  Negative for overt
pulmonary edema.  Cardiomegaly appears similar to prior.
Coarsening and prominence of the bibasilar interstitial markings is
similar to slightly progressed compared to [TK].  No acute osseous
finding.  Multilevel degenerative disc disease of the thoracic
spine.
IMPRESSION: 1.  No acute cardiopulmonary disease.
2.  Coarsening and prominence of the bibasilar interstitial
markings is similar to slightly progressed compared to [DATE].
3.  Stable mild cardiomegaly and pulmonary vascular congestion
without overt edema.
4.  Mild enlargement of the central and main pulmonary arteries as
can be seen with underlying pulmonary arterial hypertension.

## 2012-09-19 ENCOUNTER — Encounter: Payer: Self-pay | Admitting: Internal Medicine

## 2012-09-19 ENCOUNTER — Ambulatory Visit (INDEPENDENT_AMBULATORY_CARE_PROVIDER_SITE_OTHER): Payer: Medicare Other | Admitting: Internal Medicine

## 2012-09-19 VITALS — BP 124/60 | HR 72 | Ht 61.0 in | Wt 178.2 lb

## 2012-09-19 DIAGNOSIS — R141 Gas pain: Secondary | ICD-10-CM

## 2012-09-19 DIAGNOSIS — R143 Flatulence: Secondary | ICD-10-CM

## 2012-09-19 DIAGNOSIS — K648 Other hemorrhoids: Secondary | ICD-10-CM

## 2012-09-19 DIAGNOSIS — K625 Hemorrhage of anus and rectum: Secondary | ICD-10-CM

## 2012-09-19 MED ORDER — MOVIPREP 100 G PO SOLR: 1.0000 | Freq: Once | ORAL | Status: DC

## 2012-09-19 NOTE — Progress Notes (Signed)
HISTORY OF PRESENT ILLNESS:  Elaine Turner is a 66 y.o. female with the below listed medical history who presents today regarding increased intestinal gas and rectal bleeding. She tells me that she was seen in this office in years ago and has had remote colonoscopy. Unable to obtain such records at this time. She reports a several month history of intermittent rectal bleeding with the passage of gas. No significant rectal discomfort. No difficulties with bowel habits. She cannot identify any exacerbating or relieving factors with regards to gas. She denies abdominal pain or weight loss. Her bleeding is often noticed after sitting for long periods of time. No family history of colon cancer. Her chronic medical problems are stable  REVIEW OF SYSTEMS:  All non-GI ROS negative except for arthritis, heart murmur, muscle cramps, shortness of breath, ankle edema, urinary leakage  Past Medical History  Diagnosis Date  . Hypertension   . PVD (peripheral vascular disease)   . Arthritis   . Heart murmur   . Urine incontinence     Past Surgical History  Procedure Date  . Abdominal hysterectomy     Social History Elaine Turner  reports that she has been smoking.  She has never used smokeless tobacco. She reports that she drinks alcohol. She reports that she does not use illicit drugs.  family history is not on file. Family history is unknown by patient.  Allergies  Allergen Reactions  . Ace Inhibitors        PHYSICAL EXAMINATION: Vital signs: BP 124/60  Pulse 72  Ht 5\' 1"  (1.549 m)  Wt 178 lb 3.2 oz (80.831 kg)  BMI 33.67 kg/m2  Constitutional: generally well-appearing, no acute distress Psychiatric: alert and oriented x3, cooperative Eyes: extraocular movements intact, anicteric, conjunctiva pink Mouth: oral pharynx moist, no lesions Neck: supple no lymphadenopathy Cardiovascular: heart regular rate and rhythm, 2/6 systolic murmur Lungs: clear to auscultation bilaterally except for  dry crackles at the base Abdomen: soft, obese,nontender, nondistended, no obvious ascites, no peritoneal signs, normal bowel sounds, no organomegaly Rectal:prolapsed hemorrhoid which is inflamed Extremities: 1+ lower extremity edema bilaterally Skin: no lesions on visible extremities Neuro: No focal deficits.   ASSESSMENT:  #1. Rectal bleeding #2. Prolapsed hemorrhoid #3. Increased intestinal gas   PLAN:  #1. Colonoscopy to evaluate rectal bleeding and provide up to date neoplasia screening.The nature of the procedure, as well as the risks, benefits, and alternatives were carefully and thoroughly reviewed with the patient. Ample time for discussion and questions allowed. The patient understood, was satisfied, and agreed to proceed.  #2. Movi prep prescribed #3. Empiric trial of probiotic Align. Samples given #4. Literature on intestinal gas provided #5. Ongoing general medical care with Dr. Jonny Ruiz

## 2012-09-19 NOTE — Patient Instructions (Addendum)
You have been given a separate informational sheet regarding your tobacco use, the importance of quitting and local resources to help you quit.  You have been scheduled for a colonoscopy with propofol. Please follow written instructions given to you at your visit today.  Please pick up your prep kit at the pharmacy within the next 1-3 days. If you use inhalers (even only as needed), please bring them with you on the day of your procedure.  We have given you samples of Align. This puts good bacteria back into your colon. You should take 1 capsule by mouth once daily. If this works well for you, it can be purchased over the counter.  We have also given you some information on gas

## 2012-09-20 ENCOUNTER — Encounter (INDEPENDENT_AMBULATORY_CARE_PROVIDER_SITE_OTHER): Payer: Medicare Other

## 2012-09-20 DIAGNOSIS — I70219 Atherosclerosis of native arteries of extremities with intermittent claudication, unspecified extremity: Secondary | ICD-10-CM

## 2012-09-20 DIAGNOSIS — I739 Peripheral vascular disease, unspecified: Secondary | ICD-10-CM

## 2012-09-21 ENCOUNTER — Encounter: Payer: Self-pay | Admitting: Internal Medicine

## 2012-09-21 ENCOUNTER — Other Ambulatory Visit: Payer: Self-pay | Admitting: Cardiology

## 2012-09-21 ENCOUNTER — Ambulatory Visit (HOSPITAL_COMMUNITY): Payer: Medicare Other | Attending: Cardiology | Admitting: Radiology

## 2012-09-21 DIAGNOSIS — I739 Peripheral vascular disease, unspecified: Secondary | ICD-10-CM

## 2012-09-21 DIAGNOSIS — F17200 Nicotine dependence, unspecified, uncomplicated: Secondary | ICD-10-CM | POA: Insufficient documentation

## 2012-09-21 DIAGNOSIS — I10 Essential (primary) hypertension: Secondary | ICD-10-CM | POA: Insufficient documentation

## 2012-09-21 DIAGNOSIS — I358 Other nonrheumatic aortic valve disorders: Secondary | ICD-10-CM

## 2012-09-21 DIAGNOSIS — R011 Cardiac murmur, unspecified: Secondary | ICD-10-CM

## 2012-09-21 NOTE — Progress Notes (Signed)
Echocardiogram performed.  

## 2012-09-23 ENCOUNTER — Other Ambulatory Visit: Payer: Self-pay | Admitting: Internal Medicine

## 2012-09-23 ENCOUNTER — Encounter: Payer: Self-pay | Admitting: Internal Medicine

## 2012-09-23 DIAGNOSIS — I739 Peripheral vascular disease, unspecified: Secondary | ICD-10-CM

## 2012-10-13 ENCOUNTER — Encounter: Payer: Self-pay | Admitting: Vascular Surgery

## 2012-10-14 ENCOUNTER — Encounter: Payer: Self-pay | Admitting: Vascular Surgery

## 2012-10-14 ENCOUNTER — Ambulatory Visit (INDEPENDENT_AMBULATORY_CARE_PROVIDER_SITE_OTHER): Payer: Medicare Other | Admitting: Vascular Surgery

## 2012-10-14 VITALS — BP 129/70 | HR 72 | Resp 16 | Ht 61.0 in | Wt 180.0 lb

## 2012-10-14 DIAGNOSIS — I70219 Atherosclerosis of native arteries of extremities with intermittent claudication, unspecified extremity: Secondary | ICD-10-CM | POA: Insufficient documentation

## 2012-10-14 NOTE — Progress Notes (Signed)
[facility] [place]  Referred by:  Corwin Levins, MD [address] [facility] [address] [place], [place] [address]  Reason for referral: B foot pain (R>L)  History of Present Illness  Elaine Turner is a 66 y.o. (1946/12/22) female who presents with chief complaint: B leg pain.  Onset of symptom occurred years ago, without any obvious trigger.  Pain is described as cramping right foot, severity 3-6/10, and associated with ambulation <50 yards.  Patient has attempted to treat this pain with rest.  The patient has no rest pain symptoms also and no leg wounds/ulcers.  The patient does not work currently and notes her activity level has decreased since retiring.  Atherosclerotic risk factors include: HTN, smoking.  Past Medical History  Diagnosis Date  . Hypertension   . PVD (peripheral vascular disease)   . Arthritis   . Heart murmur   . Urine incontinence     Past Surgical History  Procedure Date  . Abdominal hysterectomy     History   Social History  . Marital Status: Married    Spouse Name: N/A    Number of Children: N/A  . Years of Education: 12   Occupational History  . retired    Social History Main Topics  . Smoking status: Current Every Day Smoker -- 1.0 packs/day for 20 years  . Smokeless tobacco: Never Used   Comment: pt states she is going to try chantix  . Alcohol Use: 0.6 oz/week    1 Glasses of wine per week  . Drug Use: No  . Sexually Active: Not on file   Other Topics Concern  . Not on file   Social History Narrative  . No narrative on file    History reviewed. No pertinent family history.  Current Outpatient Prescriptions on File Prior to Visit  Medication Sig Dispense Refill  . aspirin 81 MG EC tablet Take 1 tablet (81 mg total) by mouth daily. Swallow whole.  30 tablet  12  . losartan-hydrochlorothiazide (HYZAAR) 50-12.5 MG per tablet Take 1 tablet by mouth daily.  90 tablet  3  . NIFEdipine (PROCARDIA XL) 90 MG 24 hr  tablet Take 1 tablet (90 mg total) by mouth daily.  90 tablet  3  . dextromethorphan-guaiFENesin (MUCINEX DM) 30-600 MG per 12 hr tablet Take 1 tablet by mouth every 12 (twelve) hours.      Marland Kitchen MOVIPREP 100 G SOLR Take 1 kit (100 g total) by mouth once.  1 kit  0  . traZODone (DESYREL) 50 MG tablet Take 0.5-1 tablets (25-50 mg total) by mouth at bedtime as needed for sleep.  90 tablet  1  . varenicline (CHANTIX CONTINUING MONTH PAK) 1 MG tablet Take 1 tablet (1 mg total) by mouth 2 (two) times daily.  60 tablet  1  . varenicline (CHANTIX STARTING MONTH PAK) 0.5 MG X 11 & 1 MG X 42 tablet Take one 0.5 mg tablet by mouth once daily for 3 days, then increase to one 0.5 mg tablet twice daily for 4 days, then increase to one 1 mg tablet twice daily.  53 tablet  0    Allergies  Allergen Reactions  . Ace Inhibitors      REVIEW OF SYSTEMS:  (Positives checked otherwise negative)  CARDIOVASCULAR:  [ ]  chest pain, [ ]  chest pressure, [ ]  palpitations, [ ]  shortness of breath when laying flat, [ ]  shortness of breath with exertion,   [ ]   pain in feet when walking, [ ]  pain in feet when laying flat, [ ]  history of blood clot in veins (DVT), [ ]  history of phlebitis, [ ]  swelling in legs, [ ]  varicose veins  PULMONARY:  [ ]  productive cough, [ ]  asthma, [ ]  wheezing  NEUROLOGIC:  [ ]  weakness in arms or legs, [ ]  numbness in arms or legs, [ ]  difficulty speaking or slurred speech, [ ]  temporary loss of vision in one eye, [ ]  dizziness  HEMATOLOGIC:  [ ]  bleeding problems, [ ]  problems with blood clotting too easily  MUSCULOSKEL:  [ ]  joint pain, [ ]  joint swelling  GASTROINTEST:  [ ]   Vomiting blood, [ ]   Blood in stool     GENITOURINARY:  [ ]   Burning with urination, [ ]   Blood in urine  PSYCHIATRIC:  [ ]  history of major depression  INTEGUMENTARY:  [ ]  rashes, [ ]  ulcers  CONSTITUTIONAL:  [ ]  fever, [ ]  chills  Physical Examination  Filed Vitals:   10/14/12 0850  BP: 129/70  Pulse: 72    Resp: 16  Height: 5\' 1"  (1.549 m)  Weight: 180 lb (81.647 kg)  SpO2: 100%   Body mass index is 34.01 kg/(m^2).  General: A&O x 3, WD ,obese, odor of cigarettes throughout  Head: [place]/AT  Ear/Nose/Throat: Hearing grossly intact, nares w/o erythema or drainage, oropharynx w/o Erythema/Exudate  Eyes: PERRLA, EOMI  Neck: Supple, no nuchal rigidity, no palpable LAD  Pulmonary: Sym exp, good air movt, CTAB, no rales, rhonchi, & wheezing  Cardiac: RRR, Nl S1, S2, no Murmurs, rubs or gallops  Vascular: Vessel Right Left  Radial Palpable Palpable  Ulnar Palpable Palpable  Brachial Palpable Palpable  Carotid Palpable, without bruit Palpable, without bruit  Aorta Not palpable due to pannus N/A  Femoral Palpable Palpable  Popliteal Not palpable Not palpable  PT Not Palpable Not Palpable  DP Not Palpable Not Palpable   Gastrointestinal: soft, NTND, -G/R, - HSM, - masses, - CVAT B  Musculoskeletal: M/S 5/5 throughout , Extremities without ischemic changes except  BLE with chronic skin changes c/w PAD  Neurologic: CN 2-12 intact , Pain and light touch intact in extremities , Motor exam as listed above  Psychiatric: Judgment intact, Mood & affect appropriatefor pt's clinical situation  Dermatologic: See M/S exam for extremity exam, no rashes otherwise noted  Lymph : No Cervical, Axillary, or Inguinal lymphadenopathy   Non-Invasive Vascular Imaging  Outside ABI (Date: 09/20/12)  RLE: 0.55, TBI 0.26  LLE: 0.85, TBI 0.56   Outside Studies/Documentation 4 pages of outside documents were reviewed including: outpatient chart and outside ABI.  Medical Decision Making  Elaine Turner is a 66 y.o. female who presents with: B intermittent claudication.   Based on my discussion with the patient, I don't think she has lifestyle limiting claudication at this point, so we agreed to trial 3 months of maximal medical management prior to considering  Angiography.  I discussed with the  patient the natural history of intermittent claudication: 75% of patients have stable or improved symptoms in a year an only 2% require amputation. Eventually 20% may require intervention in a year.  I discussed in depth with the patient the nature of atherosclerosis, and emphasized the importance of maximal medical management including strict control of blood pressure, blood glucose, and lipid levels, antiplatelet agent, obtaining regular exercise, and cessation of smoking.    I emphasized to the pt the importance of smoking cessation.  The patient  is aware that without maximal medical management the underlying atherosclerotic disease process will progress, limiting the benefit of any interventions.  I discussed in depth with the patient a walking plan and how to execute such.  The patient is not interested in starting Pletal.  The patient will follow up in 3 months for repeat ABI.  Thank you for allowing [place] to participate in this patient's care.  Leonides Sake, MD [facility] [place] Office: [phone] Pager: [phone]  10/14/2012, 9:15 AM

## 2012-10-20 ENCOUNTER — Encounter: Payer: Medicare Other | Admitting: Internal Medicine

## 2013-01-13 ENCOUNTER — Ambulatory Visit: Payer: Medicare Other | Admitting: Vascular Surgery

## 2013-01-27 ENCOUNTER — Ambulatory Visit: Payer: Medicare Other | Admitting: Vascular Surgery

## 2013-03-02 ENCOUNTER — Encounter: Payer: Self-pay | Admitting: Vascular Surgery

## 2013-03-03 ENCOUNTER — Ambulatory Visit: Payer: Medicare Other | Admitting: Vascular Surgery

## 2013-03-08 ENCOUNTER — Encounter: Payer: Self-pay | Admitting: Vascular Surgery

## 2013-03-09 ENCOUNTER — Encounter (INDEPENDENT_AMBULATORY_CARE_PROVIDER_SITE_OTHER): Payer: Medicare Other | Admitting: *Deleted

## 2013-03-09 ENCOUNTER — Ambulatory Visit (INDEPENDENT_AMBULATORY_CARE_PROVIDER_SITE_OTHER): Payer: Medicare Other | Admitting: Vascular Surgery

## 2013-03-09 ENCOUNTER — Encounter: Payer: Self-pay | Admitting: Vascular Surgery

## 2013-03-09 VITALS — BP 148/75 | HR 68 | Ht 61.0 in | Wt 177.5 lb

## 2013-03-09 DIAGNOSIS — I739 Peripheral vascular disease, unspecified: Secondary | ICD-10-CM

## 2013-03-09 DIAGNOSIS — I70219 Atherosclerosis of native arteries of extremities with intermittent claudication, unspecified extremity: Secondary | ICD-10-CM

## 2013-03-09 NOTE — Progress Notes (Signed)
[facility] [place]  Established Intermittent Claudication  History of Present Illness  Elaine Turner is a 67 y.o. (Apr 17, 1946) female who presents with chief complaint: intermittent claudication.  The patient's symptoms have not progressed.  The patient's symptoms are: intermittent claudication in right leg.   The patient's treatment regimen currently included: maximal medical management and walking plan.  The patient does not think her sx are lifestyle altering.  Past Medical History, Past Surgical History, Social History, Family History, Medications, Allergies, and Review of Systems are unchanged from previous evaluation on 10/14/13.  Physical Examination  Filed Vitals:   03/09/13 1103  BP: 148/75  Pulse: 68  Height: 5\' 1"  (1.549 m)  Weight: 177 lb 8 oz (80.513 kg)  SpO2: 98%   Body mass index is 33.56 kg/(m^2).  General: A&O x 3, WD, obese  Pulmonary: Sym exp, good air movt, CTAB, no rales, rhonchi, & wheezing  Cardiac: RRR, Nl S1, S2, no Murmurs, rubs or gallops  Vascular: Vessel Right Left  Radial Palpable Palpable  Ulnar Palpable Palpable  Brachial Palpable Palpable  Carotid Palpable, without bruit Palpable, without bruit  Aorta Not palpable N/A  Femoral Palpable Palpable  Popliteal Not palpable Not palpable  PT Faintly Palpable Palpable  DP Not Palpable Palpable   Musculoskeletal: M/S 5/5 throughout , Extremities without ischemic changes , hair loss in both lower legs  Neurologic: Pain and light touch intact in extremities , Motor exam as listed above  Non-Invasive Vascular Imaging ABI (Date: 03/09/13)  RLE: 0.62, PT and DP: monophasic  LLE: 0.84, PT and DP: triphasic  Medical Decision Making  Elaine Turner is a 67 y.o. female who presents with: R leg intermittent claudication without evidence of critical limb ischemia.  Based on the patient's vascular studies and examination, I have offered the patient: continued surveillance with  ABI q6 months.  I discussed in depth with the patient the nature of atherosclerosis, and emphasized the importance of maximal medical management including strict control of blood pressure, blood glucose, and lipid levels, antiplatelet agents, obtaining regular exercise, and cessation of smoking.  The patient is aware that without maximal medical management the underlying atherosclerotic disease process will progress, limiting the benefit of any interventions.  I reiterated the importance of smoking cessation to the patient.  Thank you for allowing [place] to participate in this patient's care.  Leonides Sake, MD [facility] [place] Office: [phone] Pager: [phone]  03/09/2013, 12:42 PM

## 2013-03-09 NOTE — Addendum Note (Signed)
Addended by: Dannielle Karvonen on: 03/09/2013 04:16 PM   Modules accepted: Orders

## 2013-03-14 ENCOUNTER — Telehealth: Payer: Self-pay

## 2013-03-14 NOTE — Telephone Encounter (Signed)
Pt called to check the status of handicap placard requested 03/17. Please advise.

## 2013-03-14 NOTE — Telephone Encounter (Signed)
Called the patient informed to pickup placard at the front desk.

## 2013-03-14 NOTE — Telephone Encounter (Signed)
First I have heard about this  Form done to robin

## 2013-03-14 NOTE — Telephone Encounter (Signed)
Called the patient no answer and no vm to leave a message. 

## 2013-05-01 ENCOUNTER — Emergency Department (HOSPITAL_COMMUNITY): Payer: Medicare Other

## 2013-05-01 ENCOUNTER — Encounter (HOSPITAL_COMMUNITY): Payer: Self-pay | Admitting: Emergency Medicine

## 2013-05-01 ENCOUNTER — Inpatient Hospital Stay (HOSPITAL_COMMUNITY)
Admission: EM | Admit: 2013-05-01 | Discharge: 2013-05-04 | DRG: 871 | Disposition: A | Discharge status: Home / Self Care | Payer: Medicare Other | Attending: Internal Medicine | Admitting: Internal Medicine

## 2013-05-01 DIAGNOSIS — R739 Hyperglycemia, unspecified: Secondary | ICD-10-CM | POA: Diagnosis present

## 2013-05-01 DIAGNOSIS — E876 Hypokalemia: Secondary | ICD-10-CM | POA: Diagnosis present

## 2013-05-01 DIAGNOSIS — I70219 Atherosclerosis of native arteries of extremities with intermittent claudication, unspecified extremity: Secondary | ICD-10-CM

## 2013-05-01 DIAGNOSIS — I358 Other nonrheumatic aortic valve disorders: Secondary | ICD-10-CM

## 2013-05-01 DIAGNOSIS — G4700 Insomnia, unspecified: Secondary | ICD-10-CM

## 2013-05-01 DIAGNOSIS — K649 Unspecified hemorrhoids: Secondary | ICD-10-CM | POA: Diagnosis present

## 2013-05-01 DIAGNOSIS — Z0000 Encounter for general adult medical examination without abnormal findings: Secondary | ICD-10-CM

## 2013-05-01 DIAGNOSIS — R7309 Other abnormal glucose: Secondary | ICD-10-CM | POA: Diagnosis present

## 2013-05-01 DIAGNOSIS — E119 Type 2 diabetes mellitus without complications: Secondary | ICD-10-CM

## 2013-05-01 DIAGNOSIS — R6520 Severe sepsis without septic shock: Secondary | ICD-10-CM | POA: Diagnosis present

## 2013-05-01 DIAGNOSIS — I10 Essential (primary) hypertension: Secondary | ICD-10-CM | POA: Diagnosis present

## 2013-05-01 DIAGNOSIS — M129 Arthropathy, unspecified: Secondary | ICD-10-CM | POA: Diagnosis present

## 2013-05-01 DIAGNOSIS — E871 Hypo-osmolality and hyponatremia: Secondary | ICD-10-CM | POA: Diagnosis present

## 2013-05-01 DIAGNOSIS — K5900 Constipation, unspecified: Secondary | ICD-10-CM

## 2013-05-01 DIAGNOSIS — I129 Hypertensive chronic kidney disease with stage 1 through stage 4 chronic kidney disease, or unspecified chronic kidney disease: Secondary | ICD-10-CM | POA: Diagnosis present

## 2013-05-01 DIAGNOSIS — R0989 Other specified symptoms and signs involving the circulatory and respiratory systems: Secondary | ICD-10-CM

## 2013-05-01 DIAGNOSIS — M1990 Unspecified osteoarthritis, unspecified site: Secondary | ICD-10-CM

## 2013-05-01 DIAGNOSIS — E861 Hypovolemia: Secondary | ICD-10-CM | POA: Diagnosis present

## 2013-05-01 DIAGNOSIS — E785 Hyperlipidemia, unspecified: Secondary | ICD-10-CM

## 2013-05-01 DIAGNOSIS — F17200 Nicotine dependence, unspecified, uncomplicated: Secondary | ICD-10-CM

## 2013-05-01 DIAGNOSIS — J189 Pneumonia, unspecified organism: Secondary | ICD-10-CM | POA: Diagnosis present

## 2013-05-01 DIAGNOSIS — A419 Sepsis, unspecified organism: Principal | ICD-10-CM | POA: Diagnosis present

## 2013-05-01 DIAGNOSIS — Z79899 Other long term (current) drug therapy: Secondary | ICD-10-CM

## 2013-05-01 DIAGNOSIS — N182 Chronic kidney disease, stage 2 (mild): Secondary | ICD-10-CM | POA: Diagnosis present

## 2013-05-01 DIAGNOSIS — I739 Peripheral vascular disease, unspecified: Secondary | ICD-10-CM

## 2013-05-01 DIAGNOSIS — N179 Acute kidney failure, unspecified: Secondary | ICD-10-CM | POA: Diagnosis present

## 2013-05-01 HISTORY — DX: Pneumonia, unspecified organism: J18.9

## 2013-05-01 HISTORY — DX: Hyperglycemia, unspecified: R73.9

## 2013-05-01 LAB — COMPREHENSIVE METABOLIC PANEL
ALT: 17 U/L (ref 0–35)
AST: 19 U/L (ref 0–37)
Albumin: 3.1 g/dL — ABNORMAL LOW (ref 3.5–5.2)
Alkaline Phosphatase: 65 U/L (ref 39–117)
BUN: 22 mg/dL (ref 6–23)
CO2: 21 mEq/L (ref 19–32)
Calcium: 8.9 mg/dL (ref 8.4–10.5)
Chloride: 92 mEq/L — ABNORMAL LOW (ref 96–112)
Creatinine, Ser: 1.61 mg/dL — ABNORMAL HIGH (ref 0.50–1.10)
GFR calc Af Amer: 37 mL/min — ABNORMAL LOW (ref >90)
GFR calc non Af Amer: 32 mL/min — ABNORMAL LOW (ref >90)
Glucose, Bld: 182 mg/dL — ABNORMAL HIGH (ref 70–99)
Potassium: 3 mEq/L — ABNORMAL LOW (ref 3.5–5.1)
Sodium: 131 mEq/L — ABNORMAL LOW (ref 135–145)
Total Bilirubin: 0.3 mg/dL (ref 0.3–1.2)
Total Protein: 8 g/dL (ref 6.0–8.3)

## 2013-05-01 LAB — CBC WITH DIFFERENTIAL/PLATELET
Basophils Absolute: 0 10*3/uL (ref 0.0–0.1)
Basophils Relative: 0 % (ref 0–1)
Eosinophils Absolute: 0 10*3/uL (ref 0.0–0.7)
Eosinophils Relative: 0 % (ref 0–5)
HCT: 31.3 % — ABNORMAL LOW (ref 36.0–46.0)
Hemoglobin: 10.6 g/dL — ABNORMAL LOW (ref 12.0–15.0)
Lymphocytes Relative: 7 % — ABNORMAL LOW (ref 12–46)
Lymphs Abs: 1.5 10*3/uL (ref 0.7–4.0)
MCH: 24.1 pg — ABNORMAL LOW (ref 26.0–34.0)
MCHC: 33.9 g/dL (ref 30.0–36.0)
MCV: 71.3 fL — ABNORMAL LOW (ref 78.0–100.0)
Monocytes Absolute: 1 10*3/uL (ref 0.1–1.0)
Monocytes Relative: 5 % (ref 3–12)
Neutro Abs: 18.4 10*3/uL — ABNORMAL HIGH (ref 1.7–7.7)
Neutrophils Relative %: 88 % — ABNORMAL HIGH (ref 43–77)
Platelets: 442 10*3/uL — ABNORMAL HIGH (ref 150–400)
RBC: 4.39 MIL/uL (ref 3.87–5.11)
RDW: 15 % (ref 11.5–15.5)
WBC: 20.8 10*3/uL — ABNORMAL HIGH (ref 4.0–10.5)

## 2013-05-01 LAB — URINALYSIS, ROUTINE W REFLEX MICROSCOPIC
Bilirubin Urine: NEGATIVE
Glucose, UA: NEGATIVE mg/dL
Ketones, ur: NEGATIVE mg/dL
Leukocytes, UA: NEGATIVE
Nitrite: NEGATIVE
Protein, ur: 100 mg/dL — AB
Specific Gravity, Urine: 1.02 (ref 1.005–1.030)
Urobilinogen, UA: 1 mg/dL (ref 0.0–1.0)
pH: 5.5 (ref 5.0–8.0)

## 2013-05-01 LAB — HEMOGLOBIN A1C
Hgb A1c MFr Bld: 5.8 % — ABNORMAL HIGH (ref <5.7)
Mean Plasma Glucose: 120 mg/dL — ABNORMAL HIGH (ref <117)

## 2013-05-01 LAB — HIV ANTIBODY (ROUTINE TESTING W REFLEX): HIV: NONREACTIVE

## 2013-05-01 LAB — EXPECTORATED SPUTUM ASSESSMENT W GRAM STAIN, RFLX TO RESP C

## 2013-05-01 LAB — STREP PNEUMONIAE URINARY ANTIGEN: Strep Pneumo Urinary Antigen: NEGATIVE

## 2013-05-01 LAB — URINE MICROSCOPIC-ADD ON

## 2013-05-01 LAB — MAGNESIUM: Magnesium: 2.1 mg/dL (ref 1.5–2.5)

## 2013-05-01 IMAGING — CR DG ABDOMEN ACUTE W/ 1V CHEST
3 series · 3 of 3 positions shown · non-contrast
Comparison: [DATE] and earlier.

CLINICAL DATA: 67-year-old female wheezing cough constipation.

ACUTE ABDOMEN SERIES (ABDOMEN 2 VIEW & CHEST 1 VIEW)

[w chest pa]
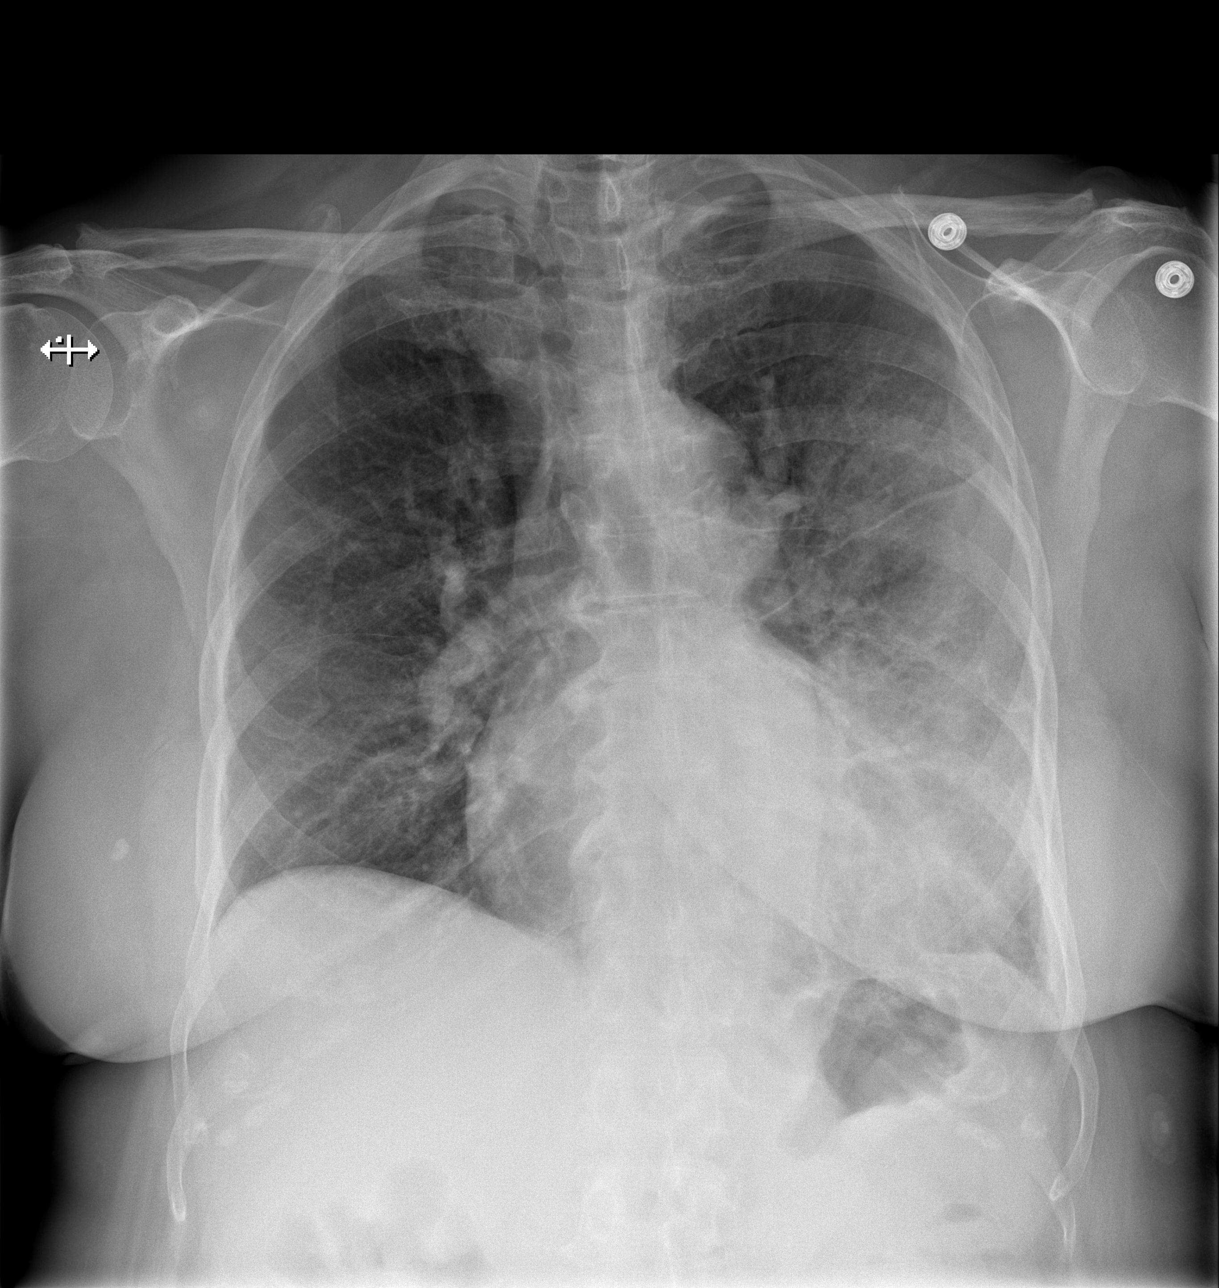

[w abdomen upright]
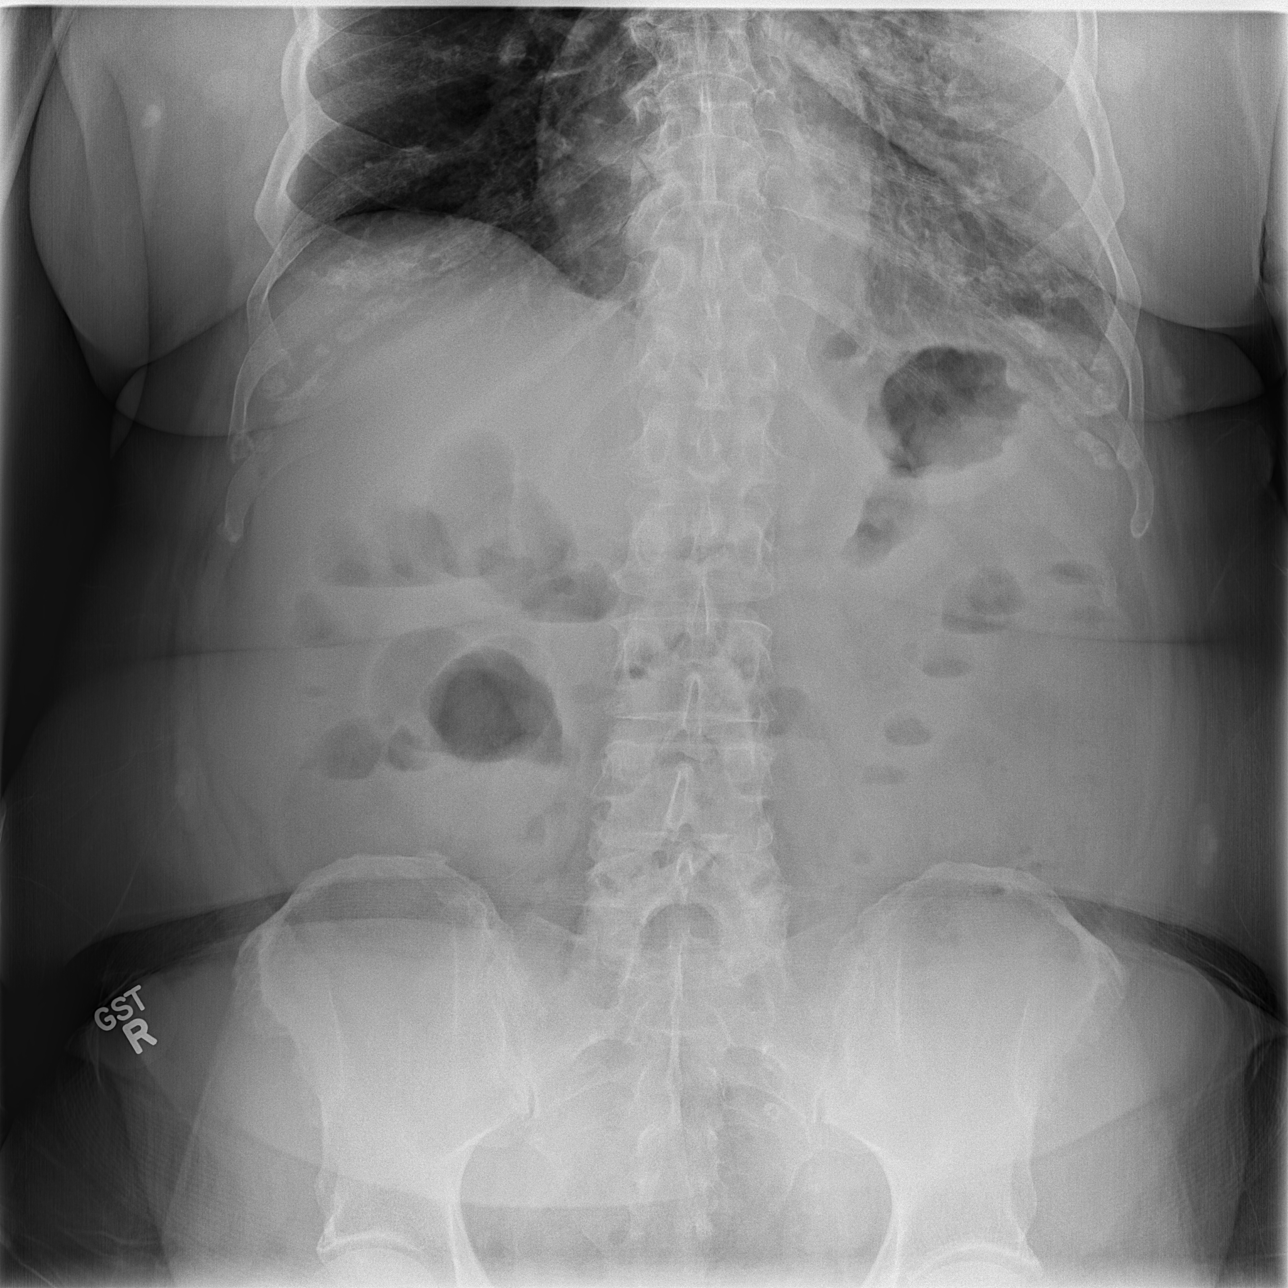

[t abdomen supine]
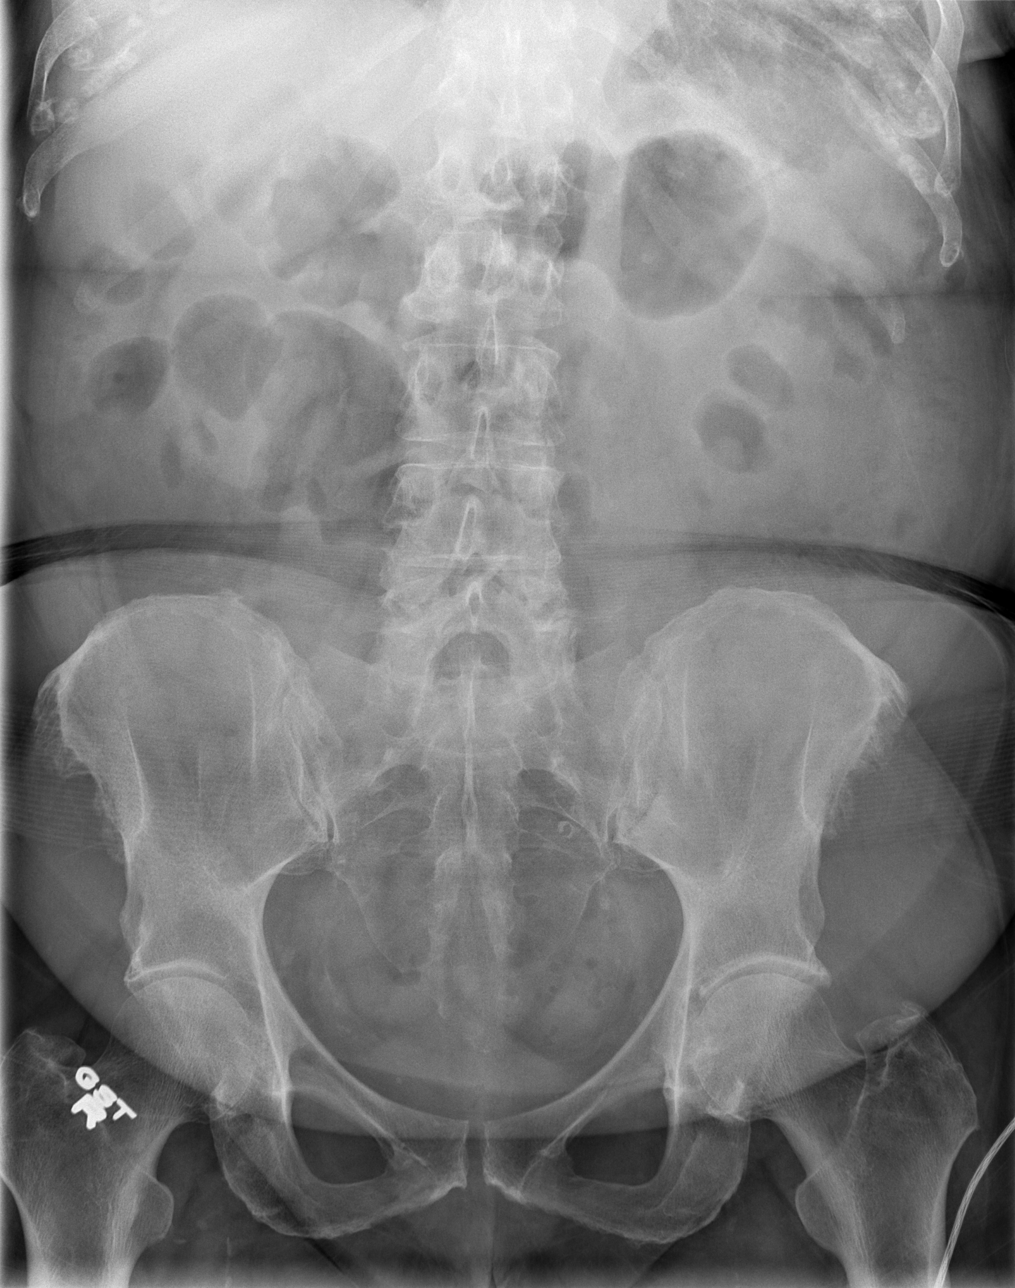

[3 of 3 positions shown; findings below may reference images not displayed]

FINDINGS: Confluent airspace disease throughout much of the left
lung.  Likely this is multilobar.

No pneumothorax or pneumoperitoneum.  Stable cardiac size and
mediastinal contours.  Right lung is stable.

Nonobstructed bowel gas pattern.  Evidence of fluid levels in the
colon.  Abdominal and pelvic visceral contours are within normal
limits.  Vascular calcifications. No acute osseous abnormality
identified.
IMPRESSION: 1.  Confluent left lung airspace disease compatible with multilobar
pneumonia.  Post-treatment radiographs recommended to document
resolution.
2. Nonobstructed bowel gas pattern, no free air.

## 2013-05-01 MED ORDER — DEXTROSE 5 % IV SOLN
1.0000 g | INTRAVENOUS | Status: DC
  Administered 2013-05-01 – 2013-05-04 (×4): 1 g via INTRAVENOUS
  Filled 2013-05-01 (×5): qty 10

## 2013-05-01 MED ORDER — ASPIRIN EC 81 MG PO TBEC
81.0000 mg | DELAYED_RELEASE_TABLET | Freq: Every day | ORAL | Status: DC
  Administered 2013-05-01 – 2013-05-04 (×4): 81 mg via ORAL
  Filled 2013-05-01 (×4): qty 1

## 2013-05-01 MED ORDER — ACETAMINOPHEN 325 MG PO TABS
650.0000 mg | ORAL_TABLET | Freq: Once | ORAL | Status: AC
  Administered 2013-05-01: 650 mg via ORAL
  Filled 2013-05-01: qty 2

## 2013-05-01 MED ORDER — DEXTROSE 5 % IV SOLN: 2.0000 g | Freq: Once | INTRAVENOUS | Status: DC

## 2013-05-01 MED ORDER — HEPARIN SODIUM (PORCINE) 5000 UNIT/ML IJ SOLN
5000.0000 [IU] | Freq: Three times a day (TID) | INTRAMUSCULAR | Status: DC
  Administered 2013-05-01 – 2013-05-03 (×5): 5000 [IU] via SUBCUTANEOUS
  Filled 2013-05-01 (×10): qty 1

## 2013-05-01 MED ORDER — MILK AND MOLASSES ENEMA
Freq: Once | RECTAL | Status: DC
  Filled 2013-05-01: qty 250

## 2013-05-01 MED ORDER — AZITHROMYCIN 500 MG PO TABS
500.0000 mg | ORAL_TABLET | ORAL | Status: DC
  Administered 2013-05-02 – 2013-05-04 (×3): 500 mg via ORAL
  Filled 2013-05-01 (×3): qty 1

## 2013-05-01 MED ORDER — POTASSIUM CHLORIDE IN NACL 20-0.9 MEQ/L-% IV SOLN
INTRAVENOUS | Status: DC
  Administered 2013-05-01 – 2013-05-03 (×4): via INTRAVENOUS
  Filled 2013-05-01 (×5): qty 1000

## 2013-05-01 MED ORDER — POTASSIUM CHLORIDE CRYS ER 20 MEQ PO TBCR
40.0000 meq | EXTENDED_RELEASE_TABLET | Freq: Two times a day (BID) | ORAL | Status: DC
  Administered 2013-05-01 – 2013-05-02 (×3): 40 meq via ORAL
  Filled 2013-05-01 (×4): qty 2

## 2013-05-01 MED ORDER — TRAZODONE HCL 50 MG PO TABS
50.0000 mg | ORAL_TABLET | Freq: Every day | ORAL | Status: DC
  Administered 2013-05-01 – 2013-05-03 (×3): 50 mg via ORAL
  Filled 2013-05-01 (×4): qty 1

## 2013-05-01 MED ORDER — SODIUM CHLORIDE 0.9 % IV BOLUS (SEPSIS)
500.0000 mL | Freq: Once | INTRAVENOUS | Status: AC
  Administered 2013-05-01: 500 mL via INTRAVENOUS

## 2013-05-01 MED ORDER — POTASSIUM CHLORIDE CRYS ER 20 MEQ PO TBCR
40.0000 meq | EXTENDED_RELEASE_TABLET | Freq: Once | ORAL | Status: AC
  Administered 2013-05-01: 40 meq via ORAL
  Filled 2013-05-01: qty 2

## 2013-05-01 MED ORDER — SODIUM CHLORIDE 0.9 % IV SOLN
INTRAVENOUS | Status: DC
  Administered 2013-05-01: 12:00:00 via INTRAVENOUS

## 2013-05-01 MED ORDER — NIFEDIPINE ER OSMOTIC RELEASE 90 MG PO TB24
90.0000 mg | ORAL_TABLET | Freq: Every day | ORAL | Status: DC
  Administered 2013-05-01 – 2013-05-04 (×4): 90 mg via ORAL
  Filled 2013-05-01 (×4): qty 1

## 2013-05-01 MED ORDER — ZOLPIDEM TARTRATE 5 MG PO TABS
5.0000 mg | ORAL_TABLET | Freq: Once | ORAL | Status: AC
  Administered 2013-05-01: 5 mg via ORAL
  Filled 2013-05-01: qty 1

## 2013-05-01 MED ORDER — DEXTROSE 5 % IV SOLN
500.0000 mg | Freq: Once | INTRAVENOUS | Status: AC
  Administered 2013-05-01: 500 mg via INTRAVENOUS
  Filled 2013-05-01: qty 500

## 2013-05-01 NOTE — Care Management Note (Signed)
    Page 1 of 1   05/04/2013     3:37:39 PM   CARE MANAGEMENT NOTE 05/04/2013  Patient:  Elaine Turner, Elaine Turner   Account Number:  [id number]  Date Initiated:  05/01/2013  Documentation initiated by:  Letha Cape  Subjective/Objective Assessment:   dx pna  admit- lives alone. pta indep.     Action/Plan:   pt eval-= no pt needs   Anticipated DC Date:  05/04/2013   Anticipated DC Plan:  HOME/SELF CARE      DC Planning Services  CM consult      Choice offered to / List presented to:             Status of service:  Completed, signed off Medicare Important Message given?   (If response is "NO", the following Medicare IM given date fields will be blank) Date Medicare IM given:   Date Additional Medicare IM given:    Discharge Disposition:  HOME/SELF CARE  Per UR Regulation:  Reviewed for med. necessity/level of care/duration of stay  If discussed at Long Length of Stay Meetings, dates discussed:    Comments:  05/04/13 15:36 Letha Cape RN, BSN [phone] patient for dc today, no needs anticipated.   05/01/13 16:03 Letha Cape RN, BSN [phone] patient lives alone, pta indep.  Patient has medicaiton coverage and her daughter will be transporting her home at time of discharge.  NCM will continue to follow for dc needs.

## 2013-05-01 NOTE — ED Notes (Signed)
Pt. States "I haven't been able to eat or drink or sleep for four days". Abdomen distended, no bowel sounds heard, denies pain. Pt. Also reports not being able to urinate or have bowel movement x4 days.

## 2013-05-01 NOTE — Progress Notes (Signed)
The patient is a 67 year old woman with a history significant for hypertension and peripheral vascular disease, who was admitted early this morning for community acquired pneumonia and sepsis. She was briefly seen and examined. Her chart, vital signs, laboratory studies were reviewed. We'll continue antibiotics as ordered. We'll decrease IV fluids a little as the patient's blood pressures have improved. Continue to supplement potassium chloride. We'll check a magnesium level to rule out deficiency.

## 2013-05-01 NOTE — ED Notes (Signed)
PT. REPORTS INSOMNIA , CONSTIPATION AND DRY MOUTH / POOR APPETITE FOR SEVERAL DAYS .

## 2013-05-01 NOTE — H&P (Signed)
[facility] History and Physical  Elaine Turner ZOX:[id number] DOB: 06-08-1946 DOA: 05/01/2013  Referring physician: ED PCP: Elaine Barre, MD  Specialists: None  Chief Complaint: Insomnia  HPI: Elaine Turner is a 67 y.o. female who presents with complaints including insomnia, constipation, as well as fever and chills, poor appetite, mild cough not very productive.  Symptoms are moderate she states, ongoing, constant, nothing seems to make them better nor worse.  In the ED patient was found to have fever of 102.3, WBC 20.8k, borderline tachycardia and borderline tachypnea, and CXR  Review of Systems: No SOB, CP, DOE, Dysuria 12 systems reviewed and otherwise negative.  Past Medical History  Diagnosis Date  . Hypertension   . PVD (peripheral vascular disease)   . Arthritis   . Heart murmur   . Urine incontinence    Past Surgical History  Procedure Laterality Date  . Abdominal hysterectomy     Social History:  reports that she has been smoking.  She has never used smokeless tobacco. She reports that she drinks about 0.6 ounces of alcohol per week. She reports that she does not use illicit drugs. No recent travel, no sick contacts.  Allergies  Allergen Reactions  . Ace Inhibitors Cough    No family history on file. No one in family has been sick the patient states.  Prior to Admission medications   Medication Sig Start Date End Date Taking? Authorizing Provider  aspirin 81 MG EC tablet Take 1 tablet (81 mg total) by mouth daily. Swallow whole. 09/08/12 09/08/13 Yes Corwin Levins, MD  losartan-hydrochlorothiazide (HYZAAR) 50-12.5 MG per tablet Take 1 tablet by mouth daily.   Yes Historical Provider, MD  NIFEdipine (PROCARDIA XL) 90 MG 24 hr tablet Take 1 tablet (90 mg total) by mouth daily. 09/08/12 09/08/13 Yes Corwin Levins, MD  traZODone (DESYREL) 50 MG tablet Take 50 mg by mouth at bedtime.   Yes Historical Provider, MD   Physical Exam: Filed Vitals:   05/01/13 0210 05/01/13  0324 05/01/13 0420  BP: 154/65 126/52 147/66  Pulse: 117 103 96  Temp: 102.3 F (39.1 C) 101.1 F (38.4 C) 100.7 F (38.2 C)  TempSrc: Oral Oral   Resp:  30 24  SpO2: 94% 93% 96%    General:  NAD, resting comfortably in bed Eyes: PEERLA EOMI ENT: mucous membranes moist Neck: supple w/o JVD Cardiovascular: RRR w/o MRG Respiratory: Decreased breathsounds in left Abdomen: soft, nt, nd, bs+ Skin: no rash nor lesion Musculoskeletal: MAE, full ROM all 4 extremities Psychiatric: normal tone and affect Neurologic: AAOx3, grossly non-focal  Labs on Admission:  Basic Metabolic Panel:  Recent Labs Lab 05/01/13 0229  NA 131*  K 3.0*  CL 92*  CO2 21  GLUCOSE 182*  BUN 22  CREATININE 1.61*  CALCIUM 8.9   Liver Function Tests:  Recent Labs Lab 05/01/13 0229  AST 19  ALT 17  ALKPHOS 65  BILITOT 0.3  PROT 8.0  ALBUMIN 3.1*   No results found for this basename: LIPASE, AMYLASE,  in the last 168 hours No results found for this basename: AMMONIA,  in the last 168 hours CBC:  Recent Labs Lab 05/01/13 0229  WBC 20.8*  NEUTROABS 18.4*  HGB 10.6*  HCT 31.3*  MCV 71.3*  PLT 442*   Cardiac Enzymes: No results found for this basename: CKTOTAL, CKMB, CKMBINDEX, TROPONINI,  in the last 168 hours  BNP (last 3 results) No results found for this basename: PROBNP,  in the last 8760  hours CBG: No results found for this basename: GLUCAP,  in the last 168 hours  Radiological Exams on Admission: Dg Abd Acute W/chest  05/01/2013  *RADIOLOGY REPORT*  Clinical Data: 67 year old female wheezing cough constipation.  ACUTE ABDOMEN SERIES (ABDOMEN 2 VIEW & CHEST 1 VIEW)  Comparison: 09/12/2012 and earlier.  Findings: Confluent airspace disease throughout much of the left lung.  Likely this is multilobar.  No pneumothorax or pneumoperitoneum.  Stable cardiac size and mediastinal contours.  Right lung is stable.  Nonobstructed bowel gas pattern.  Evidence of fluid levels in the colon.   Abdominal and pelvic visceral contours are within normal limits.  Vascular calcifications. No acute osseous abnormality identified.  IMPRESSION: 1.  Confluent left lung airspace disease compatible with multilobar pneumonia.  Post-treatment radiographs recommended to document resolution. 2. Nonobstructed bowel gas pattern, no free air.   Original Report Authenticated By: Erskine Speed, M.D.     EKG: Independently reviewed.  Assessment/Plan Principal Problem:   CAP (community acquired pneumonia) Active Problems:   Sepsis   1. CAP with sepsis - on PNA pathway, rocephin and azithromycin, patient with more of a "walking pneumonia" like presentation, so suspect something more like mycoplasma as the organism.  Admitting to tele for monitoring. 2. Renal insufficiency - Creatinine of 1.6, baseline appears to be 1.3, likely pre-renal, holding ARB-diuretic, and giving fluids.    Code Status: Full Code (must indicate code status--if unknown or must be presumed, indicate so) Family Communication: Spoke with family at bedside (indicate person spoken with, if applicable, with phone number if by telephone) Disposition Plan: Admit to inpatient (indicate anticipated LOS)  Time spent: 70 min  GARDNER, JARED M. [facility] Pager [phone]  If 7PM-7AM, please contact night-coverage [email or web address] Password [email or web address] 05/01/2013, 5:05 AM

## 2013-05-01 NOTE — ED Provider Notes (Signed)
History     CSN: [id number]  Arrival date & time 05/01/13  0205   First MD Initiated Contact with Patient 05/01/13 0320      Chief Complaint  Patient presents with  . Insomnia  . Constipation   HPI Elaine Turner is a 67 y.o. female presenting with multiple complaints including insomnia, constipation for a week, dry mouth, poor appetite and has been drinking quite as much fluid. Patient has a mild cough is not very productive. Denies any fevers, chills, chest pain. Denies any dysuria or frequency.  Symptoms are moderate to severe, ongoing, constant, no other alleviating or exacerbating factors   Past Medical History  Diagnosis Date  . Hypertension   . PVD (peripheral vascular disease)   . Arthritis   . Heart murmur   . Urine incontinence     Past Surgical History  Procedure Laterality Date  . Abdominal hysterectomy      No family history on file.  History  Substance Use Topics  . Smoking status: Current Every Day Smoker -- 1.00 packs/day for 20 years  . Smokeless tobacco: Never Used  . Alcohol Use: 0.6 oz/week    1 Glasses of wine per week    OB History   Grav Para Term Preterm Abortions TAB SAB Ect Mult Living                  Review of Systems At least 10pt or greater review of systems completed and are negative except where specified in the HPI.  Allergies  Ace inhibitors  Home Medications   Current Outpatient Rx  Name  Route  Sig  Dispense  Refill  . aspirin 81 MG EC tablet   Oral   Take 1 tablet (81 mg total) by mouth daily. Swallow whole.   30 tablet   12   . dextromethorphan-guaiFENesin (MUCINEX DM) 30-600 MG per 12 hr tablet   Oral   Take 1 tablet by mouth every 12 (twelve) hours.         . fluticasone (FLONASE) 50 MCG/ACT nasal spray               . losartan-hydrochlorothiazide (HYZAAR) 50-12.5 MG per tablet   Oral   Take 1 tablet by mouth daily.   90 tablet   3   . MOVIPREP 100 G SOLR   Oral   Take 1 kit (100 g total) by mouth  once.   1 kit   0     Dispense as written.   Marland Kitchen NIFEdipine (PROCARDIA XL) 90 MG 24 hr tablet   Oral   Take 1 tablet (90 mg total) by mouth daily.   90 tablet   3   . EXPIRED: traZODone (DESYREL) 50 MG tablet   Oral   Take 0.5-1 tablets (25-50 mg total) by mouth at bedtime as needed for sleep.   90 tablet   1     BP 126/52  Pulse 103  Temp(Src) 101.1 F (38.4 C) (Oral)  Resp 30  SpO2 93%  Physical Exam  Nursing notes reviewed.  Electronic medical record reviewed. VITAL SIGNS:   Filed Vitals:   05/01/13 0210 05/01/13 0324 05/01/13 0420  BP: 154/65 126/52 147/66  Pulse: 117 103 96  Temp: 102.3 F (39.1 C) 101.1 F (38.4 C) 100.7 F (38.2 C)  TempSrc: Oral Oral   Resp:  30 24  SpO2: 94% 93% 96%   CONSTITUTIONAL: Awake, oriented, appears non-toxic HENT: Atraumatic, normocephalic, oral mucosa pink and moist, airway  patent. Nares patent without drainage. External ears normal. EYES: Conjunctiva clear, EOMI, PERRLA NECK: Trachea midline, non-tender, supple CARDIOVASCULAR: Normal heart rate, Normal rhythm, No murmurs, rubs, gallops PULMONARY/CHEST: Decreased in the left, base, end-expiratory wheezing. Asymmetrical breath sounds. Non-tender. ABDOMINAL: Non-distended, hypertympanic, soft, non-tender - no rebound or guarding.  BS normal. NEUROLOGIC: Non-focal, moving all four extremities, no gross sensory or motor deficits. EXTREMITIES: No clubbing, cyanosis, or edema SKIN: Warm, Dry, No erythema, No rash  ED Course  Procedures (including critical care time)  Labs Reviewed  CBC WITH DIFFERENTIAL - Abnormal; Notable for the following:    WBC 20.8 (*)    Hemoglobin 10.6 (*)    HCT 31.3 (*)    MCV 71.3 (*)    MCH 24.1 (*)    Platelets 442 (*)    Neutrophils Relative 88 (*)    Neutro Abs 18.4 (*)    Lymphocytes Relative 7 (*)    All other components within normal limits  COMPREHENSIVE METABOLIC PANEL - Abnormal; Notable for the following:    Sodium 131 (*)     Potassium 3.0 (*)    Chloride 92 (*)    Glucose, Bld 182 (*)    Creatinine, Ser 1.61 (*)    Albumin 3.1 (*)    GFR calc non Af Amer 32 (*)    GFR calc Af Amer 37 (*)    All other components within normal limits  URINALYSIS, ROUTINE W REFLEX MICROSCOPIC - Abnormal; Notable for the following:    APPearance TURBID (*)    Hgb urine dipstick MODERATE (*)    Protein, ur 100 (*)    All other components within normal limits  URINE MICROSCOPIC-ADD ON - Abnormal; Notable for the following:    Squamous Epithelial / LPF MANY (*)    Bacteria, UA FEW (*)    Casts HYALINE CASTS (*)    All other components within normal limits   Dg Abd Acute W/chest  05/01/2013  *RADIOLOGY REPORT*  Clinical Data: 67 year old female wheezing cough constipation.  ACUTE ABDOMEN SERIES (ABDOMEN 2 VIEW & CHEST 1 VIEW)  Comparison: 09/12/2012 and earlier.  Findings: Confluent airspace disease throughout much of the left lung.  Likely this is multilobar.  No pneumothorax or pneumoperitoneum.  Stable cardiac size and mediastinal contours.  Right lung is stable.  Nonobstructed bowel gas pattern.  Evidence of fluid levels in the colon.  Abdominal and pelvic visceral contours are within normal limits.  Vascular calcifications. No acute osseous abnormality identified.  IMPRESSION: 1.  Confluent left lung airspace disease compatible with multilobar pneumonia.  Post-treatment radiographs recommended to document resolution. 2. Nonobstructed bowel gas pattern, no free air.   Original Report Authenticated By: Erskine Speed, M.D.      1. CAP (community acquired pneumonia)   2. Constipation   3. Diabetes   4. Sepsis       MDM  Elaine Turner is a 67 y.o. female arrives with multiple complaints, initially she is febrile, tachycardic and mildly tachypneic on my exam, she has decreased breath sounds in the left base.  X-ray shows a left-sided multilobar pneumonia, she has not had exposure to prior antibiotics will be started on treatment for  community-acquired pneumonia. Based on her past medical history patient will be admitted to telemetry for further observation and treatment. Blood cultures have been obtained.  Pt meets SIRS criteria and "sepsis" diagnoses, but has maintained BP.  Pt admitted stable.         Jones Skene, MD 05/01/13 [phone]

## 2013-05-02 DIAGNOSIS — E876 Hypokalemia: Secondary | ICD-10-CM

## 2013-05-02 DIAGNOSIS — N179 Acute kidney failure, unspecified: Secondary | ICD-10-CM

## 2013-05-02 DIAGNOSIS — J189 Pneumonia, unspecified organism: Secondary | ICD-10-CM

## 2013-05-02 DIAGNOSIS — K649 Unspecified hemorrhoids: Secondary | ICD-10-CM

## 2013-05-02 DIAGNOSIS — E871 Hypo-osmolality and hyponatremia: Secondary | ICD-10-CM

## 2013-05-02 LAB — CBC
HCT: 29.8 % — ABNORMAL LOW (ref 36.0–46.0)
Hemoglobin: 10 g/dL — ABNORMAL LOW (ref 12.0–15.0)
MCH: 23.5 pg — ABNORMAL LOW (ref 26.0–34.0)
MCHC: 33.6 g/dL (ref 30.0–36.0)
MCV: 70 fL — ABNORMAL LOW (ref 78.0–100.0)
Platelets: 459 10*3/uL — ABNORMAL HIGH (ref 150–400)
RBC: 4.26 MIL/uL (ref 3.87–5.11)
RDW: 15.4 % (ref 11.5–15.5)
WBC: 15.6 10*3/uL — ABNORMAL HIGH (ref 4.0–10.5)

## 2013-05-02 LAB — BASIC METABOLIC PANEL
BUN: 17 mg/dL (ref 6–23)
CO2: 20 mEq/L (ref 19–32)
Calcium: 8.5 mg/dL (ref 8.4–10.5)
Chloride: 99 mEq/L (ref 96–112)
Creatinine, Ser: 1.23 mg/dL — ABNORMAL HIGH (ref 0.50–1.10)
GFR calc Af Amer: 51 mL/min — ABNORMAL LOW (ref >90)
GFR calc non Af Amer: 44 mL/min — ABNORMAL LOW (ref >90)
Glucose, Bld: 127 mg/dL — ABNORMAL HIGH (ref 70–99)
Potassium: 4.2 mEq/L (ref 3.5–5.1)
Sodium: 132 mEq/L — ABNORMAL LOW (ref 135–145)

## 2013-05-02 LAB — LEGIONELLA ANTIGEN, URINE: Legionella Antigen, Urine: NEGATIVE

## 2013-05-02 MED ORDER — LEVALBUTEROL HCL 0.63 MG/3ML IN NEBU
0.6300 mg | INHALATION_SOLUTION | Freq: Four times a day (QID) | RESPIRATORY_TRACT | Status: DC | PRN
  Filled 2013-05-02: qty 3

## 2013-05-02 MED ORDER — HYDROCORTISONE ACETATE 25 MG RE SUPP
25.0000 mg | Freq: Two times a day (BID) | RECTAL | Status: AC
  Administered 2013-05-02: 25 mg via RECTAL
  Filled 2013-05-02 (×3): qty 1

## 2013-05-02 MED ORDER — BENZONATATE 100 MG PO CAPS
100.0000 mg | ORAL_CAPSULE | Freq: Three times a day (TID) | ORAL | Status: DC | PRN
  Filled 2013-05-02: qty 1

## 2013-05-02 MED ORDER — POTASSIUM CHLORIDE CRYS ER 20 MEQ PO TBCR
20.0000 meq | EXTENDED_RELEASE_TABLET | Freq: Every day | ORAL | Status: DC
  Administered 2013-05-03 – 2013-05-04 (×2): 20 meq via ORAL
  Filled 2013-05-02 (×3): qty 1

## 2013-05-02 NOTE — Progress Notes (Addendum)
Subjective: The patient was seen earlier. She says that she is feeling better. Later, the nursing staff reported some hemorrhoidal bleeding.  Objective: Vital signs in last 24 hours: Filed Vitals:   05/01/13 1635 05/01/13 2226 05/02/13 0654 05/02/13 1423  BP: 143/57 145/52 120/68 137/55  Pulse: 107 111 104 104  Temp: 100 F (37.8 C) 100.5 F (38.1 C) 99.3 F (37.4 C) 98.8 F (37.1 C)  TempSrc: Oral Oral Oral Oral  Resp: 22 28 28 20   Height:      Weight:      SpO2: 98% 92% 94% 96%    Intake/Output Summary (Last 24 hours) at 05/02/13 1759 Last data filed at 05/01/13 1800  Gross per 24 hour  Intake  928.5 ml  Output      0 ml  Net  928.5 ml    Weight change:   Physical exam: General: Pleasant [name] who is sitting up in bed, in no acute distress. She looks less ill today. Lungs: Few left-sided crackles. Breathing is nonlabored. Heart: S1, S2, with borderline tachycardia. Abdomen: Positive bowel sounds, soft, nontender, nondistended. Extremities: Trace of pedal edema bilaterally. Climb neurologic: She is alert and oriented x2. Cranial nerves II through XII are grossly intact.  Lab Results: Basic Metabolic Panel:  Recent Labs  40/98/11 0229 05/02/13 0545  NA 131* 132*  K 3.0* 4.2  CL 92* 99  CO2 21 20  GLUCOSE 182* 127*  BUN 22 17  CREATININE 1.61* 1.23*  CALCIUM 8.9 8.5  MG 2.1  --    Liver Function Tests:  Recent Labs  05/01/13 0229  AST 19  ALT 17  ALKPHOS 65  BILITOT 0.3  PROT 8.0  ALBUMIN 3.1*   No results found for this basename: LIPASE, AMYLASE,  in the last 72 hours No results found for this basename: AMMONIA,  in the last 72 hours CBC:  Recent Labs  05/01/13 0229 05/02/13 0545  WBC 20.8* 15.6*  NEUTROABS 18.4*  --   HGB 10.6* 10.0*  HCT 31.3* 29.8*  MCV 71.3* 70.0*  PLT 442* 459*   Cardiac Enzymes: No results found for this basename: CKTOTAL, CKMB, CKMBINDEX, TROPONINI,  in the last 72 hours BNP: No  results found for this basename: PROBNP,  in the last 72 hours D-Dimer: No results found for this basename: DDIMER,  in the last 72 hours CBG: No results found for this basename: GLUCAP,  in the last 72 hours Hemoglobin A1C:  Recent Labs  05/01/13 0830  HGBA1C 5.8*   Fasting Lipid Panel: No results found for this basename: CHOL, HDL, LDLCALC, TRIG, CHOLHDL, LDLDIRECT,  in the last 72 hours Thyroid Function Tests: No results found for this basename: TSH, T4TOTAL, FREET4, T3FREE, THYROIDAB,  in the last 72 hours Anemia Panel: No results found for this basename: VITAMINB12, FOLATE, FERRITIN, TIBC, IRON, RETICCTPCT,  in the last 72 hours Coagulation: No results found for this basename: LABPROT, INR,  in the last 72 hours Urine Drug Screen: Drugs of Abuse  No results found for this basename: labopia, cocainscrnur, labbenz, amphetmu, thcu, labbarb    Alcohol Level: No results found for this basename: ETH,  in the last 72 hours Urinalysis:  Recent Labs  05/01/13 0311  COLORURINE YELLOW  LABSPEC 1.020  PHURINE 5.5  GLUCOSEU NEGATIVE  HGBUR MODERATE*  BILIRUBINUR NEGATIVE  KETONESUR NEGATIVE  PROTEINUR 100*  UROBILINOGEN 1.0  NITRITE NEGATIVE  LEUKOCYTESUR NEGATIVE   Misc. Labs:   Micro: Recent Results (from the past 240  hour(s))  CULTURE, BLOOD (ROUTINE X 2)     Status: None   Collection Time    05/01/13  4:50 AM      Result Value Range Status   Specimen Description BLOOD LEFT ARM   Final   Special Requests BOTTLES DRAWN AEROBIC AND ANAEROBIC 10CC EA   Final   Culture  Setup Time 05/01/2013 08:46   Final   Culture     Final   Value:        BLOOD CULTURE RECEIVED NO GROWTH TO DATE CULTURE WILL BE HELD FOR 5 DAYS BEFORE ISSUING A FINAL NEGATIVE REPORT   Report Status PENDING   Incomplete  CULTURE, BLOOD (ROUTINE X 2)     Status: None   Collection Time    05/01/13  4:55 AM      Result Value Range Status   Specimen Description BLOOD LEFT ARM   Final   Special  Requests BOTTLES DRAWN AEROBIC AND ANAEROBIC 10CC EA   Final   Culture  Setup Time 05/01/2013 08:45   Final   Culture     Final   Value:        BLOOD CULTURE RECEIVED NO GROWTH TO DATE CULTURE WILL BE HELD FOR 5 DAYS BEFORE ISSUING A FINAL NEGATIVE REPORT   Report Status PENDING   Incomplete  CULTURE, EXPECTORATED SPUTUM-ASSESSMENT     Status: None   Collection Time    05/01/13  9:23 AM      Result Value Range Status   Specimen Description SPUTUM   Final   Special Requests NONE   Final   Sputum evaluation     Final   Value: MICROSCOPIC FINDINGS SUGGEST THAT THIS SPECIMEN IS NOT REPRESENTATIVE OF LOWER RESPIRATORY SECRETIONS. PLEASE RECOLLECT.     CALLED TO Mackie Pai 05/01/13 1050 BY K SCHULTZ   Report Status 05/01/2013 FINAL   Final    Studies/Results: Dg Abd Acute W/chest  05/01/2013  *RADIOLOGY REPORT*  Clinical Data: 67 year old female wheezing cough constipation.  ACUTE ABDOMEN SERIES (ABDOMEN 2 VIEW & CHEST 1 VIEW)  Comparison: 09/12/2012 and earlier.  Findings: Confluent airspace disease throughout much of the left lung.  Likely this is multilobar.  No pneumothorax or pneumoperitoneum.  Stable cardiac size and mediastinal contours.  Right lung is stable.  Nonobstructed bowel gas pattern.  Evidence of fluid levels in the colon.  Abdominal and pelvic visceral contours are within normal limits.  Vascular calcifications. No acute osseous abnormality identified.  IMPRESSION: 1.  Confluent left lung airspace disease compatible with multilobar pneumonia.  Post-treatment radiographs recommended to document resolution. 2. Nonobstructed bowel gas pattern, no free air.   Original Report Authenticated By: Erskine Speed, M.D.     Medications:  Scheduled: . aspirin EC  81 mg Oral Daily  . azithromycin  500 mg Oral Q24H  . cefTRIAXone (ROCEPHIN)  IV  1 g Intravenous Q24H  . heparin  5,000 Units Subcutaneous Q8H  . NIFEdipine  90 mg Oral Daily  . potassium chloride  40 mEq Oral BID  . traZODone   50 mg Oral QHS   Continuous: . 0.9 % NaCl with KCl 20 mEq / L 70 mL/hr at 05/02/13 1641   PRN:  Assessment: Principal Problem:   CAP (community acquired pneumonia) Active Problems:   Sepsis   Hypertension   Hypokalemia   Hyponatremia   CKD (chronic kidney disease), stage II   Hyperglycemia   Hemorrhoids  1. Community-acquired pneumonia with sepsis. She appears to be improving  clinically. She is less febrile. Her white blood cell count is trending downward. She will need at least a couple more days of hospitalization.  Hyperglycemia. Query history of diabetes. Her hemoglobin A1c is 5.8. We'll continue to monitor.   Hypertension. Will continue Procardia. Losartan/HCTZ is being held because of dehydration and acute renal insufficiency.  Acute renal failure superimposed on stage III to chronic kidney disease. Her creatinine is improving with hydration.  Hyponatremia, likely secondary to hypovolemia. Continue IV fluid hydration.  Hypokalemia. Repleted/supplemented. Magnesium level is within normal limits.  Reported hemorrhoidal bleeding. We'll at [facility].  Plan:  1. Continue antibiotics and supportive treatment.  2. Add Tessalon Perles as needed for cough and as needed bronchodilators.. 3. Restart losartan HCTZ if her blood pressure becomes uncontrolled. 4. Anusol-HC and stool softener. If there is evidence of significant bleeding, we'll discontinue subcutaneous heparin.   LOS: 1 day   [place] 05/02/2013, 5:59 PM

## 2013-05-02 NOTE — Progress Notes (Signed)
Pt has had frequent small semiformed stools today (5 times that I know of), and has used large amounts of toilet paper each time, with blood smears on the toilet paper from her hemorrhoids.

## 2013-05-03 LAB — BASIC METABOLIC PANEL
BUN: 11 mg/dL (ref 6–23)
CO2: 20 mEq/L (ref 19–32)
Calcium: 8.6 mg/dL (ref 8.4–10.5)
Chloride: 103 mEq/L (ref 96–112)
Creatinine, Ser: 0.9 mg/dL (ref 0.50–1.10)
GFR calc Af Amer: 74 mL/min — ABNORMAL LOW (ref >90)
GFR calc non Af Amer: 64 mL/min — ABNORMAL LOW (ref >90)
Glucose, Bld: 108 mg/dL — ABNORMAL HIGH (ref 70–99)
Potassium: 4.6 mEq/L (ref 3.5–5.1)
Sodium: 133 mEq/L — ABNORMAL LOW (ref 135–145)

## 2013-05-03 LAB — CBC
HCT: 27.4 % — ABNORMAL LOW (ref 36.0–46.0)
Hemoglobin: 9.3 g/dL — ABNORMAL LOW (ref 12.0–15.0)
MCH: 23.6 pg — ABNORMAL LOW (ref 26.0–34.0)
MCHC: 33.9 g/dL (ref 30.0–36.0)
MCV: 69.5 fL — ABNORMAL LOW (ref 78.0–100.0)
Platelets: 485 K/uL — ABNORMAL HIGH (ref 150–400)
RBC: 3.94 MIL/uL (ref 3.87–5.11)
RDW: 15.5 % (ref 11.5–15.5)
WBC: 12.2 K/uL — ABNORMAL HIGH (ref 4.0–10.5)

## 2013-05-03 MED ORDER — LEVALBUTEROL HCL 0.63 MG/3ML IN NEBU
0.6300 mg | INHALATION_SOLUTION | Freq: Four times a day (QID) | RESPIRATORY_TRACT | Status: DC
  Administered 2013-05-03 – 2013-05-04 (×3): 0.63 mg via RESPIRATORY_TRACT
  Filled 2013-05-03 (×8): qty 3

## 2013-05-03 MED ORDER — ENOXAPARIN SODIUM 40 MG/0.4ML ~~LOC~~ SOLN
40.0000 mg | SUBCUTANEOUS | Status: DC
  Administered 2013-05-03: 40 mg via SUBCUTANEOUS
  Filled 2013-05-03 (×2): qty 0.4

## 2013-05-03 NOTE — Progress Notes (Signed)
Pt has had a significant amount of blood loss throughout the night. May need to d/c heparin.

## 2013-05-03 NOTE — Progress Notes (Signed)
Patient evaluated for long-term disease management services with [facility] Management Program as a benefit of her [employer]. At bedside to explain services and consents were signed. She lives alone but reports she manages fine. Has supportive daughter. Patient will receive a post discharge transition of care call and will be evaluated for monthly home visits for assessments and for education. Confirmed contact information and left [facility] Care Management packet at bedside.  Raiford Noble, MSN-Ed, RN,BSN, [facility], [phone]

## 2013-05-03 NOTE — Progress Notes (Signed)
PATIENT DETAILS Name: Elaine Turner Age: 67 y.o. Sex: female Date of Birth: [birth year] Admit Date: 05/01/2013 Admitting Physician Hillary Bow, DO BJY:[facility]GNF Jonny Ruiz, MD  Subjective: No major complaints-feels a lot better than the past few days  Assessment/Plan: Principal Problem:   CAP (community acquired pneumonia) -clinically improved, without fever, leukocytosis is downtrending -continue with empiric Rocephin and Zithromax -Blood cultures on admission-neg to date  Active Problems:   Hypertension -BP controlled with Nifedipine -resume Hyzaar when able  Acute renal failure  -resolved-2/2 PNA and pre-renal physiology -Creatinine back to normal  Hyponatremia -likely secondary to hypovolemia.  -Na much better at 133  Peripheral Vascular Disease -see's Dr Imogene Burn as outpatient-continued surveillance with ABI q6 months.  Minimal Rectal Bleeding -suspect Hemorrhoidal -c/w Annusol   Disposition: Remain inpatient-home in 1-2 days  DVT Prophylaxis: Prophylactic Lovenox   Code Status: Full code  Family Communication None at bedside  Procedures:  None  CONSULTS:  None   MEDICATIONS: Scheduled Meds: . aspirin EC  81 mg Oral Daily  . azithromycin  500 mg Oral Q24H  . cefTRIAXone (ROCEPHIN)  IV  1 g Intravenous Q24H  . hydrocortisone  25 mg Rectal BID  . NIFEdipine  90 mg Oral Daily  . potassium chloride  20 mEq Oral Daily  . traZODone  50 mg Oral QHS   Continuous Infusions:  PRN Meds:.benzonatate, levalbuterol  Antibiotics: Anti-infectives   Start     Dose/Rate Route Frequency Ordered Stop   05/02/13 1000  azithromycin (ZITHROMAX) tablet 500 mg     500 mg Oral Every 24 hours 05/01/13 0504 05/09/13 0959   05/01/13 0800  cefTRIAXone (ROCEPHIN) 1 g in dextrose 5 % 50 mL IVPB     1 g 100 mL/hr over 30 Minutes Intravenous Every 24 hours 05/01/13 0504 05/08/13 0759   05/01/13 0445  cefTRIAXone (ROCEPHIN) 2 g in dextrose 5 % 50 mL IVPB  Status:  Discontinued      2 g 100 mL/hr over 30 Minutes Intravenous  Once 05/01/13 0434 05/01/13 0643   05/01/13 0445  azithromycin (ZITHROMAX) 500 mg in dextrose 5 % 250 mL IVPB     500 mg 250 mL/hr over 60 Minutes Intravenous  Once 05/01/13 0434 05/01/13 0636       PHYSICAL EXAM: Vital signs in last 24 hours: Filed Vitals:   05/03/13 0538 05/03/13 1011 05/03/13 1013 05/03/13 1022  BP: 126/72     Pulse: 102 91  114  Temp: 98.7 F (37.1 C)     TempSrc: Oral     Resp: 20     Height:      Weight:      SpO2: 90% 96% 93% 91%    Weight change:  Filed Weights   05/01/13 0700  Weight: 79.6 kg (175 lb 7.8 oz)   Body mass index is 32.09 kg/(m^2).   Gen Exam: Awake and alert with clear speech.   Neck: Supple, No JVD.   Chest: B/L Clear.   CVS: S1 S2 Regular, no murmurs.  Abdomen: soft, BS +, non tender, non distended.  Extremities: no edema, lower extremities warm to touch. Neurologic: Non Focal.   Skin: No Rash.   Wounds: N/A.    Intake/Output from previous day:  Intake/Output Summary (Last 24 hours) at 05/03/13 1347 Last data filed at 05/03/13 0615  Gross per 24 hour  Intake 2587.5 ml  Output      0 ml  Net 2587.5 ml     LAB RESULTS: CBC  Recent  Labs Lab 05/01/13 0229 05/02/13 0545 05/03/13 0600  WBC 20.8* 15.6* 12.2*  HGB 10.6* 10.0* 9.3*  HCT 31.3* 29.8* 27.4*  PLT 442* 459* 485*  MCV 71.3* 70.0* 69.5*  MCH 24.1* 23.5* 23.6*  MCHC 33.9 33.6 33.9  RDW 15.0 15.4 15.5  LYMPHSABS 1.5  --   --   MONOABS 1.0  --   --   EOSABS 0.0  --   --   BASOSABS 0.0  --   --     Chemistries   Recent Labs Lab 05/01/13 0229 05/02/13 0545 05/03/13 0600  NA 131* 132* 133*  K 3.0* 4.2 4.6  CL 92* 99 103  CO2 21 20 20   GLUCOSE 182* 127* 108*  BUN 22 17 11   CREATININE 1.61* 1.23* 0.90  CALCIUM 8.9 8.5 8.6  MG 2.1  --   --     CBG: No results found for this basename: GLUCAP,  in the last 168 hours  GFR Estimated Creatinine Clearance: 57.6 ml/min (by C-G formula based on Cr of  0.9).  Coagulation profile No results found for this basename: INR, PROTIME,  in the last 168 hours  Cardiac Enzymes No results found for this basename: CK, CKMB, TROPONINI, MYOGLOBIN,  in the last 168 hours  No components found with this basename: POCBNP,  No results found for this basename: DDIMER,  in the last 72 hours  Recent Labs  05/01/13 0830  HGBA1C 5.8*   No results found for this basename: CHOL, HDL, LDLCALC, TRIG, CHOLHDL, LDLDIRECT,  in the last 72 hours No results found for this basename: TSH, T4TOTAL, FREET3, T3FREE, THYROIDAB,  in the last 72 hours No results found for this basename: VITAMINB12, FOLATE, FERRITIN, TIBC, IRON, RETICCTPCT,  in the last 72 hours No results found for this basename: LIPASE, AMYLASE,  in the last 72 hours  Urine Studies No results found for this basename: UACOL, UAPR, USPG, UPH, UTP, UGL, UKET, UBIL, UHGB, UNIT, UROB, ULEU, UEPI, UWBC, URBC, UBAC, CAST, CRYS, UCOM, BILUA,  in the last 72 hours  MICROBIOLOGY: Recent Results (from the past 240 hour(s))  CULTURE, BLOOD (ROUTINE X 2)     Status: None   Collection Time    05/01/13  4:50 AM      Result Value Range Status   Specimen Description BLOOD LEFT ARM   Final   Special Requests BOTTLES DRAWN AEROBIC AND ANAEROBIC 10CC EA   Final   Culture  Setup Time 05/01/2013 08:46   Final   Culture     Final   Value:        BLOOD CULTURE RECEIVED NO GROWTH TO DATE CULTURE WILL BE HELD FOR 5 DAYS BEFORE ISSUING A FINAL NEGATIVE REPORT   Report Status PENDING   Incomplete  CULTURE, BLOOD (ROUTINE X 2)     Status: None   Collection Time    05/01/13  4:55 AM      Result Value Range Status   Specimen Description BLOOD LEFT ARM   Final   Special Requests BOTTLES DRAWN AEROBIC AND ANAEROBIC 10CC EA   Final   Culture  Setup Time 05/01/2013 08:45   Final   Culture     Final   Value:        BLOOD CULTURE RECEIVED NO GROWTH TO DATE CULTURE WILL BE HELD FOR 5 DAYS BEFORE ISSUING A FINAL NEGATIVE REPORT    Report Status PENDING   Incomplete  CULTURE, EXPECTORATED SPUTUM-ASSESSMENT     Status: None  Collection Time    05/01/13  9:23 AM      Result Value Range Status   Specimen Description SPUTUM   Final   Special Requests NONE   Final   Sputum evaluation     Final   Value: MICROSCOPIC FINDINGS SUGGEST THAT THIS SPECIMEN IS NOT REPRESENTATIVE OF LOWER RESPIRATORY SECRETIONS. PLEASE RECOLLECT.     CALLED TO Mackie Pai 05/01/13 1050 BY K SCHULTZ   Report Status 05/01/2013 FINAL   Final    RADIOLOGY STUDIES/RESULTS: Dg Abd Acute W/chest  05/01/2013  *RADIOLOGY REPORT*  Clinical Data: 67 year old female wheezing cough constipation.  ACUTE ABDOMEN SERIES (ABDOMEN 2 VIEW & CHEST 1 VIEW)  Comparison: 09/12/2012 and earlier.  Findings: Confluent airspace disease throughout much of the left lung.  Likely this is multilobar.  No pneumothorax or pneumoperitoneum.  Stable cardiac size and mediastinal contours.  Right lung is stable.  Nonobstructed bowel gas pattern.  Evidence of fluid levels in the colon.  Abdominal and pelvic visceral contours are within normal limits.  Vascular calcifications. No acute osseous abnormality identified.  IMPRESSION: 1.  Confluent left lung airspace disease compatible with multilobar pneumonia.  Post-treatment radiographs recommended to document resolution. 2. Nonobstructed bowel gas pattern, no free air.   Original Report Authenticated By: Erskine Speed, M.D.     Jeoffrey Massed, MD  [facility] Pager:[phone]  If 7PM-7AM, please contact night-coverage [email or web address] Password [email or web address] 05/03/2013, 1:47 PM   LOS: 2 days

## 2013-05-03 NOTE — Evaluation (Signed)
Physical Therapy Evaluation Patient Details Name: Elaine Turner MRN: [id number] DOB: 1946-11-04 Today's Date: 05/03/2013 Time: 1478-2956 PT Time Calculation (min): 25 min  PT Assessment / Plan / Recommendation Clinical Impression    Pt admitted with PNA. Pt currently with functional limitations due to the deficits listed below (PT Problem List). Pt will benefit from skilled PT to increase their independence and safety with mobility to allow discharge home alone.      PT Assessment  Patient needs continued PT services    Follow Up Recommendations  No PT follow up    Does the patient have the potential to tolerate intense rehabilitation      Barriers to Discharge        Equipment Recommendations  None recommended by PT    Recommendations for Other Services     Frequency Min 3X/week    Precautions / Restrictions     Pertinent Vitals/Pain See flow sheet.      Mobility  Transfers Transfers: Sit to Stand;Stand to Sit;Stand Pivot Transfers Sit to Stand: 5: Supervision;With upper extremity assist;With armrests;From chair/3-in-1 Stand to Sit: 5: Supervision;With armrests;To chair/3-in-1 Stand Pivot Transfers: 5: Supervision;With armrests Details for Transfer Assistance: supervision for lines/tubes Ambulation/Gait Ambulation/Gait Assistance: 5: Supervision Ambulation Distance (Feet): 30 Feet Assistive device: None Ambulation/Gait Assistance Details: Pt became fatigued quickly and dyspneic.  Stopped to rest against wall and then pt incontinent of urine and had to return to room. Pt had used BSC x 3 prior to amb. Gait Pattern: Step-through pattern;Decreased stride length    Exercises     PT Diagnosis: Difficulty walking  PT Problem List: Decreased activity tolerance;Decreased mobility PT Treatment Interventions: DME instruction;Gait training;Patient/family education;Functional mobility training;Therapeutic activities;Therapeutic exercise   PT Goals Acute Rehab PT Goals PT  Goal Formulation: With patient Time For Goal Achievement: 05/10/13 Potential to Achieve Goals: Good Pt will go Sit to Stand: Independently PT Goal: Sit to Stand - Progress: Goal set today Pt will go Stand to Sit: Independently PT Goal: Stand to Sit - Progress: Goal set today Pt will Ambulate: 51 - 150 feet;Independently PT Goal: Ambulate - Progress: Goal set today  Visit Information  Last PT Received On: 05/03/13 Assistance Needed: +1    Subjective Data  Subjective: Pt states she is very independent. Patient Stated Goal: Return home   Prior Functioning  Home Living Lives With: Alone Type of Home: House Home Layout: One level Bathroom Shower/Tub: Tub/shower unit Home Adaptive Equipment: None Prior Function Level of Independence: Independent Able to Take Stairs?: Yes Vocation: Retired Musician: No difficulties    Copywriter, advertising Arousal/Alertness: Awake/alert Behavior During Therapy: WFL for tasks assessed/performed Overall Cognitive Status: Within Functional Limits for tasks assessed    Extremity/Trunk Assessment Right Lower Extremity Assessment RLE ROM/Strength/Tone: [facility] for tasks assessed Left Lower Extremity Assessment LLE ROM/Strength/Tone: WFL for tasks assessed   Balance Balance Balance Assessed: Yes Static Standing Balance Static Standing - Balance Support: No upper extremity supported;During functional activity Static Standing - Level of Assistance: 5: Stand by assistance  End of Session PT - End of Session Activity Tolerance: Patient limited by fatigue Patient left: in chair;with call bell/phone within reach Nurse Communication: Mobility status  GP     [facility] 05/03/2013, 10:47 AM  [facility] PT [phone]

## 2013-05-04 DIAGNOSIS — I70219 Atherosclerosis of native arteries of extremities with intermittent claudication, unspecified extremity: Secondary | ICD-10-CM

## 2013-05-04 LAB — HEMOGLOBIN AND HEMATOCRIT, BLOOD
HCT: 27.5 % — ABNORMAL LOW (ref 36.0–46.0)
Hemoglobin: 9.4 g/dL — ABNORMAL LOW (ref 12.0–15.0)

## 2013-05-04 MED ORDER — POTASSIUM CHLORIDE CRYS ER 20 MEQ PO TBCR: 20.0000 meq | EXTENDED_RELEASE_TABLET | Freq: Every day | ORAL | Status: DC

## 2013-05-04 MED ORDER — ALBUTEROL SULFATE HFA 108 (90 BASE) MCG/ACT IN AERS
1.0000 | INHALATION_SPRAY | RESPIRATORY_TRACT | Status: DC | PRN
  Filled 2013-05-04: qty 6.7

## 2013-05-04 MED ORDER — LEVOFLOXACIN 750 MG PO TABS: 750.0000 mg | ORAL_TABLET | Freq: Every day | ORAL | Status: DC

## 2013-05-04 MED ORDER — ALBUTEROL SULFATE HFA 108 (90 BASE) MCG/ACT IN AERS: 1.0000 | INHALATION_SPRAY | RESPIRATORY_TRACT | Status: DC | PRN

## 2013-05-04 MED ORDER — BENZONATATE 100 MG PO CAPS: 100.0000 mg | ORAL_CAPSULE | Freq: Three times a day (TID) | ORAL | Status: DC | PRN

## 2013-05-04 MED ORDER — HYDROCORTISONE ACETATE 25 MG RE SUPP: 25.0000 mg | Freq: Two times a day (BID) | RECTAL | Status: DC

## 2013-05-04 NOTE — Discharge Summary (Signed)
PATIENT DETAILS Name: Elaine Turner Age: 67 y.o. Sex: female Date of Birth: 1946-10-18 MRN: [id number]. Admit Date: 05/01/2013 Admitting Physician: Hillary Bow, DO [id number] Jonny Ruiz, MD  Recommendations for Outpatient Follow-up:  1. Repeat Chest Xray in 6-8 weeks to document resolution of the consolidation 2. Needs GI follow up-have scheduled appointment with [facility] on 05/08/13-see below  PRIMARY DISCHARGE DIAGNOSIS:  Principal Problem:   CAP (community acquired pneumonia) Active Problems:   Hypertension   Sepsis   Hypokalemia   Hyponatremia   CKD (chronic kidney disease), stage II   Hyperglycemia   Hemorrhoids   Acute renal failure      PAST MEDICAL HISTORY: Past Medical History  Diagnosis Date  . Hypertension   . PVD (peripheral vascular disease)   . Arthritis   . Heart murmur   . Urine incontinence   . Pneumonia 05/01/2013    DISCHARGE MEDICATIONS:   Medication List    TAKE these medications       albuterol 108 (90 BASE) MCG/ACT inhaler  Commonly known as:  PROVENTIL HFA;VENTOLIN HFA  Inhale 1-2 puffs into the lungs every 4 (four) hours as needed for wheezing or shortness of breath.     aspirin 81 MG EC tablet  Take 1 tablet (81 mg total) by mouth daily. Swallow whole.     benzonatate 100 MG capsule  Commonly known as:  TESSALON  Take 1 capsule (100 mg total) by mouth 3 (three) times daily as needed for cough.     hydrocortisone 25 MG suppository  Commonly known as:  ANUSOL-HC  Place 1 suppository (25 mg total) rectally 2 (two) times daily.     levofloxacin 750 MG tablet  Commonly known as:  LEVAQUIN  Take 1 tablet (750 mg total) by mouth daily.     losartan-hydrochlorothiazide 50-12.5 MG per tablet  Commonly known as:  HYZAAR  Take 1 tablet by mouth daily.     NIFEdipine 90 MG 24 hr tablet  Commonly known as:  PROCARDIA XL  Take 1 tablet (90 mg total) by mouth daily.     potassium chloride SA 20 MEQ tablet  Commonly known as:   K-DUR,KLOR-CON  Take 1 tablet (20 mEq total) by mouth daily.     traZODone 50 MG tablet  Commonly known as:  DESYREL  Take 50 mg by mouth at bedtime.        ALLERGIES:   Allergies  Allergen Reactions  . Ace Inhibitors Cough    BRIEF HPI:  See H&P, Labs, Consult and Test reports for all details in brief, patient was admitted for fever and chills.In the ED patient was found to have fever of 102.3, WBC 20.8k, borderline tachycardia and borderline tachypnea, and CXR suggestive of PNA  CONSULTATIONS:   None  PERTINENT RADIOLOGIC STUDIES: Dg Abd Acute W/chest  05/01/2013  *RADIOLOGY REPORT*  Clinical Data: 68 year old female wheezing cough constipation.  ACUTE ABDOMEN SERIES (ABDOMEN 2 VIEW & CHEST 1 VIEW)  Comparison: 09/12/2012 and earlier.  Findings: Confluent airspace disease throughout much of the left lung.  Likely this is multilobar.  No pneumothorax or pneumoperitoneum.  Stable cardiac size and mediastinal contours.  Right lung is stable.  Nonobstructed bowel gas pattern.  Evidence of fluid levels in the colon.  Abdominal and pelvic visceral contours are within normal limits.  Vascular calcifications. No acute osseous abnormality identified.  IMPRESSION: 1.  Confluent left lung airspace disease compatible with multilobar pneumonia.  Post-treatment radiographs recommended to document resolution. 2. Nonobstructed bowel gas pattern,  no free air.   Original Report Authenticated By: Erskine Speed, M.D.      PERTINENT LAB RESULTS: CBC:  Recent Labs  05/02/13 0545 05/03/13 0600 05/04/13 0430  WBC 15.6* 12.2*  --   HGB 10.0* 9.3* 9.4*  HCT 29.8* 27.4* 27.5*  PLT 459* 485*  --    CMET CMP     Component Value Date/Time   NA 133* 05/03/2013 0600   K 4.6 05/03/2013 0600   CL 103 05/03/2013 0600   CO2 20 05/03/2013 0600   GLUCOSE 108* 05/03/2013 0600   BUN 11 05/03/2013 0600   CREATININE 0.90 05/03/2013 0600   CALCIUM 8.6 05/03/2013 0600   PROT 8.0 05/01/2013 0229   ALBUMIN 3.1* 05/01/2013 0229    AST 19 05/01/2013 0229   ALT 17 05/01/2013 0229   ALKPHOS 65 05/01/2013 0229   BILITOT 0.3 05/01/2013 0229   GFRNONAA 64* 05/03/2013 0600   GFRAA 74* 05/03/2013 0600    GFR Estimated Creatinine Clearance: 57.6 ml/min (by C-G formula based on Cr of 0.9). No results found for this basename: LIPASE, AMYLASE,  in the last 72 hours No results found for this basename: CKTOTAL, CKMB, CKMBINDEX, TROPONINI,  in the last 72 hours No components found with this basename: POCBNP,  No results found for this basename: DDIMER,  in the last 72 hours No results found for this basename: HGBA1C,  in the last 72 hours No results found for this basename: CHOL, HDL, LDLCALC, TRIG, CHOLHDL, LDLDIRECT,  in the last 72 hours No results found for this basename: TSH, T4TOTAL, FREET3, T3FREE, THYROIDAB,  in the last 72 hours No results found for this basename: VITAMINB12, FOLATE, FERRITIN, TIBC, IRON, RETICCTPCT,  in the last 72 hours Coags: No results found for this basename: PT, INR,  in the last 72 hours Microbiology: Recent Results (from the past 240 hour(s))  CULTURE, BLOOD (ROUTINE X 2)     Status: None   Collection Time    05/01/13  4:50 AM      Result Value Range Status   Specimen Description BLOOD LEFT ARM   Final   Special Requests BOTTLES DRAWN AEROBIC AND ANAEROBIC 10CC EA   Final   Culture  Setup Time 05/01/2013 08:46   Final   Culture     Final   Value:        BLOOD CULTURE RECEIVED NO GROWTH TO DATE CULTURE WILL BE HELD FOR 5 DAYS BEFORE ISSUING A FINAL NEGATIVE REPORT   Report Status PENDING   Incomplete  CULTURE, BLOOD (ROUTINE X 2)     Status: None   Collection Time    05/01/13  4:55 AM      Result Value Range Status   Specimen Description BLOOD LEFT ARM   Final   Special Requests BOTTLES DRAWN AEROBIC AND ANAEROBIC 10CC EA   Final   Culture  Setup Time 05/01/2013 08:45   Final   Culture     Final   Value:        BLOOD CULTURE RECEIVED NO GROWTH TO DATE CULTURE WILL BE HELD FOR 5 DAYS BEFORE  ISSUING A FINAL NEGATIVE REPORT   Report Status PENDING   Incomplete  CULTURE, EXPECTORATED SPUTUM-ASSESSMENT     Status: None   Collection Time    05/01/13  9:23 AM      Result Value Range Status   Specimen Description SPUTUM   Final   Special Requests NONE   Final   Sputum evaluation  Final   Value: MICROSCOPIC FINDINGS SUGGEST THAT THIS SPECIMEN IS NOT REPRESENTATIVE OF LOWER RESPIRATORY SECRETIONS. PLEASE RECOLLECT.     CALLED TO J WESSELINK,RN 05/01/13 1050 BY K SCHULTZ   Report Status 05/01/2013 FINAL   Final     BRIEF HOSPITAL COURSE:   Principal Problem: CAP (community acquired pneumonia) -patient was admitted, started on empiric Rocephin and Zithromax. She slowly made clinical improvement. Her fever has resolved, her leukocytosis has resolved. She is feeling much better and is anxious to be discharged today.Her O2 saturation in room air is around 94 percent, she is ambulating in the room without any difficulty. Blood cultures drawn on admission are negative to date. We will transition her to Levaquin on discharge to complete a total of 7 days of antibiotic therapy. -she will require a repeat CXR in 5-8 weeks-will defer this to her PCP.  Active Problems: Hypertension  -BP controlled with Nifedipine  -resume Hyzaar on discharge -monitor K periodically  Acute renal failure  -resolved-2/2 PNA and pre-renal physiology  -Creatinine back to normal   Hyponatremia  -likely secondary to hypovolemia.  -Na much better at 133   Peripheral Vascular Disease  -see's Dr Imogene Burn as outpatient-continued surveillance with ABI q6 months   Minimal Rectal Bleeding  -suspect Hemorrhoidal  -c/w Annusol on discharge -she was scheduled to undergo a colonoscopy by Dr Marina Goodell last year-but never got it done, I have scheduled a follow up appointment with [facility] on 5/12.Her Rectal bleeding is now just minimal-Hb is stable.Feel that further work can be done in the outpatient setting.  TODAY-DAY  OF DISCHARGE:  Subjective:   Rogena Deupree today has no headache,no chest abdominal pain,no new weakness tingling or numbness, feels much better wants to go home today.   Objective:   Blood pressure 122/67, pulse 90, temperature 98.2 F (36.8 C), temperature source Oral, resp. rate 20, height 5\' 2"  (1.575 m), weight 79.6 kg (175 lb 7.8 oz), SpO2 94.00%.  Intake/Output Summary (Last 24 hours) at 05/04/13 1036 Last data filed at 05/03/13 1300  Gross per 24 hour  Intake    240 ml  Output      0 ml  Net    240 ml   Filed Weights   05/01/13 0700  Weight: 79.6 kg (175 lb 7.8 oz)    Exam Awake Alert, Oriented *3, No new F.N deficits, Normal affect [place].AT,PERRAL Supple Neck,No JVD, No cervical lymphadenopathy appriciated.  Symmetrical Chest wall movement, Good air movement bilaterally, CTAB RRR,No Gallops,Rubs or new Murmurs, No Parasternal Heave +ve B.Sounds, Abd Soft, Non tender, No organomegaly appriciated, No rebound -guarding or rigidity. No Cyanosis, Clubbing or edema, No new Rash or bruise  DISCHARGE CONDITION: Stable  DISPOSITION: Home  DISCHARGE INSTRUCTIONS:    Activity:  As tolerated   Diet recommendation: Heart Healthy diet   Discharge Orders   Future Appointments Provider Department Dept Phone   05/08/2013 10:30 AM Amy Oswald Hillock, PA-C [facility] [phone]   09/15/2013 11:00 AM [facility] [phone]   09/15/2013 11:30 AM Fransisco Hertz, MD [facility] [phone]   Future Orders Complete By Expires     Call MD for:  difficulty breathing, headache or visual disturbances  As directed     Call MD for:  temperature >100.4  As directed     Diet - low sodium heart healthy  As directed     Increase activity slowly  As directed  Follow-up Information   Follow up with Oliver Barre, MD. Schedule an appointment as soon as possible for a visit in 1 week.    Contact information:   [address] Tomasa Blase [place] [address] [phone]       Follow up with Mike Gip, PA-C On 05/08/2013. (appointment at 10:30 am.)    Contact information:   [address] [place] [address] [phone]      Total Time spent on discharge equals 45 minutes.  SignedJeoffrey Massed 05/04/2013 10:36 AM

## 2013-05-04 NOTE — Progress Notes (Signed)
NURSING PROGRESS NOTE  Elaine Turner [id number] Discharge Data: 05/04/2013 1:20 PM Attending Provider: Maretta Bees, MD [id number] Elaine Ruiz, MD     [name] to be D/C'd Home per MD order.    All IV's discontinued with no bleeding noted.  All belongings returned to patient for patient to take home.   Last Vital Signs:  Blood pressure 122/67, pulse 90, temperature 98.2 F (36.8 C), temperature source Oral, resp. rate 20, height 5\' 2"  (1.575 m), weight 79.6 kg (175 lb 7.8 oz), SpO2 94.00%.  Discharge Medication List   Medication List    TAKE these medications       albuterol 108 (90 BASE) MCG/ACT inhaler  Commonly known as:  PROVENTIL HFA;VENTOLIN HFA  Inhale 1-2 puffs into the lungs every 4 (four) hours as needed for wheezing or shortness of breath.     aspirin 81 MG EC tablet  Take 1 tablet (81 mg total) by mouth daily. Swallow whole.     benzonatate 100 MG capsule  Commonly known as:  TESSALON  Take 1 capsule (100 mg total) by mouth 3 (three) times daily as needed for cough.     hydrocortisone 25 MG suppository  Commonly known as:  ANUSOL-HC  Place 1 suppository (25 mg total) rectally 2 (two) times daily.     levofloxacin 750 MG tablet  Commonly known as:  LEVAQUIN  Take 1 tablet (750 mg total) by mouth daily.     losartan-hydrochlorothiazide 50-12.5 MG per tablet  Commonly known as:  HYZAAR  Take 1 tablet by mouth daily.     NIFEdipine 90 MG 24 hr tablet  Commonly known as:  PROCARDIA XL  Take 1 tablet (90 mg total) by mouth daily.     potassium chloride SA 20 MEQ tablet  Commonly known as:  K-DUR,KLOR-CON  Take 1 tablet (20 mEq total) by mouth daily.     traZODone 50 MG tablet  Commonly known as:  DESYREL  Take 50 mg by mouth at bedtime.        Madelin Rear RN, MSN, [employer]

## 2013-05-07 LAB — CULTURE, BLOOD (ROUTINE X 2)
Culture: NO GROWTH
Culture: NO GROWTH

## 2013-05-08 ENCOUNTER — Encounter: Payer: Self-pay | Admitting: Internal Medicine

## 2013-05-08 ENCOUNTER — Encounter: Payer: Self-pay | Admitting: Physician Assistant

## 2013-05-08 ENCOUNTER — Other Ambulatory Visit (INDEPENDENT_AMBULATORY_CARE_PROVIDER_SITE_OTHER): Payer: Medicare Other

## 2013-05-08 ENCOUNTER — Ambulatory Visit (INDEPENDENT_AMBULATORY_CARE_PROVIDER_SITE_OTHER): Payer: Medicare Other | Admitting: Physician Assistant

## 2013-05-08 ENCOUNTER — Ambulatory Visit (INDEPENDENT_AMBULATORY_CARE_PROVIDER_SITE_OTHER): Payer: Medicare Other | Admitting: Internal Medicine

## 2013-05-08 VITALS — BP 122/58 | HR 80 | Temp 98.4°F | Ht 62.0 in | Wt 167.0 lb

## 2013-05-08 VITALS — BP 102/50 | HR 80 | Ht 62.0 in | Wt 167.0 lb

## 2013-05-08 DIAGNOSIS — G4700 Insomnia, unspecified: Secondary | ICD-10-CM

## 2013-05-08 DIAGNOSIS — D649 Anemia, unspecified: Secondary | ICD-10-CM

## 2013-05-08 DIAGNOSIS — I10 Essential (primary) hypertension: Secondary | ICD-10-CM

## 2013-05-08 DIAGNOSIS — K625 Hemorrhage of anus and rectum: Secondary | ICD-10-CM

## 2013-05-08 DIAGNOSIS — F17200 Nicotine dependence, unspecified, uncomplicated: Secondary | ICD-10-CM

## 2013-05-08 DIAGNOSIS — K644 Residual hemorrhoidal skin tags: Secondary | ICD-10-CM

## 2013-05-08 DIAGNOSIS — R197 Diarrhea, unspecified: Secondary | ICD-10-CM

## 2013-05-08 DIAGNOSIS — J189 Pneumonia, unspecified organism: Secondary | ICD-10-CM

## 2013-05-08 DIAGNOSIS — E876 Hypokalemia: Secondary | ICD-10-CM

## 2013-05-08 LAB — CBC WITH DIFFERENTIAL/PLATELET
Basophils Absolute: 0.1 10*3/uL (ref 0.0–0.1)
Basophils Relative: 0.4 % (ref 0.0–3.0)
Eosinophils Absolute: 0.3 10*3/uL (ref 0.0–0.7)
Eosinophils Relative: 2.4 % (ref 0.0–5.0)
HCT: 28.8 % — ABNORMAL LOW (ref 36.0–46.0)
Hemoglobin: 9.4 g/dL — ABNORMAL LOW (ref 12.0–15.0)
Lymphocytes Relative: 12.9 % (ref 12.0–46.0)
Lymphs Abs: 1.8 10*3/uL (ref 0.7–4.0)
MCHC: 32.7 g/dL (ref 30.0–36.0)
MCV: 73.1 fl — ABNORMAL LOW (ref 78.0–100.0)
Monocytes Absolute: 0.8 10*3/uL (ref 0.1–1.0)
Monocytes Relative: 5.6 % (ref 3.0–12.0)
Neutro Abs: 11.2 10*3/uL — ABNORMAL HIGH (ref 1.4–7.7)
Neutrophils Relative %: 78.7 % — ABNORMAL HIGH (ref 43.0–77.0)
Platelets: 928 10*3/uL — ABNORMAL HIGH (ref 150.0–400.0)
RBC: 3.94 Mil/uL (ref 3.87–5.11)
RDW: 16.3 % — ABNORMAL HIGH (ref 11.5–14.6)
WBC: 14.3 10*3/uL — ABNORMAL HIGH (ref 4.5–10.5)

## 2013-05-08 MED ORDER — NYSTATIN 100000 UNIT/ML MT SUSP: OROMUCOSAL | Status: DC

## 2013-05-08 MED ORDER — POTASSIUM CHLORIDE ER 10 MEQ PO TBCR: 20.0000 meq | EXTENDED_RELEASE_TABLET | Freq: Every day | ORAL | Status: DC

## 2013-05-08 MED ORDER — TEMAZEPAM 15 MG PO CAPS: ORAL_CAPSULE | ORAL | Status: DC

## 2013-05-08 NOTE — Patient Instructions (Addendum)
Please go to the basement level to have your labs drawn and a stool study. We have given you a sample of the colonoscopy prep. You can get Preperation H at the pharmacy or [employer].  Use a small amount 3 times daily in the rectal area for hemorrhoids.

## 2013-05-08 NOTE — Assessment & Plan Note (Signed)
Urged to quit, high risk for lung cancer

## 2013-05-08 NOTE — Assessment & Plan Note (Signed)
stable overall by history and exam, recent data reviewed with pt, and pt to continue medical treatment as before,  to f/u any worsening symptoms or concerns BP Readings from Last 3 Encounters:  05/08/13 122/58  05/08/13 102/50  05/04/13 146/71

## 2013-05-08 NOTE — Patient Instructions (Signed)
OK to finish the antibiotic as you mentioned today Please stop smoking OK to stop the trazodone Please take all new medication as prescribed - the temazepam at bedtime as needed Please keep your appointments with your specialists as you have planned - the colonscopy Please return in 1 week, or sooner if needed, as we will want to re-check.

## 2013-05-08 NOTE — Assessment & Plan Note (Signed)
Clinically improving, with mild persistent rales on exam, finishing antibx, long hx of smoking, will ask to return in 1 wk for f/u cxr and exam

## 2013-05-08 NOTE — Assessment & Plan Note (Addendum)
Ok to chagne K to [employer] con 10 pills, to take 2 per day, prob f/u labs next wk as well, given the recent mild anemia and leukocytosis

## 2013-05-08 NOTE — Progress Notes (Signed)
Agree with initial assessment and plans as outlined 

## 2013-05-08 NOTE — Progress Notes (Signed)
Subjective:    Patient ID: Elaine Turner, female    DOB: 1946/06/12, 67 y.o.   MRN: [id number]  HPI Elaine Turner is a pleasant 67 year old American female known to Dr. Marina Goodell. She had been in the office  September of 2013 and was scheduled for colonoscopy at that time which she did not pursue. She has history of hypertension, chronic kidney disease, peripheral vascular disease, hyperlipidemia, and had a recent admission 53 through 05/04/2013 with pneumonia and sepsis. She took her last dose of Levaquin yesterday and says that she is improving from the pneumonia standpoint.  Apparently during that hospitalization she was noted to have minimal rectal bleeding and is asked to followup here. Patient states that he started having loose stools when she was admitted to the hospital and that has continued through today. She's having one to 2 loose bowel movements per day and says that she seeing small amounts of bright red blood mixed in with her bowel movements. She denies any abdominal pain or cramping and again says she had had no changes in her bowel habits prior to this recent admission Hemoglobin when checked to the hospital on 05/04/2013 was 9.4 hematocrit of 27.5 hemoglobin 7 months ago was 12.2, hematocrit of 39 . She does take a baby aspirin daily is not on any other blood thinners.    Review of Systems  Constitutional: Positive for activity change and appetite change.  HENT: Positive for mouth sores.   Eyes: Negative.   Cardiovascular: Negative.   Gastrointestinal: Positive for diarrhea and blood in stool.  Endocrine: Negative.   Genitourinary: Negative.   Musculoskeletal: Negative.   Skin: Negative.   Allergic/Immunologic: Negative.   Neurological: Negative.   Hematological: Negative.   Psychiatric/Behavioral: Negative.    Outpatient Prescriptions Prior to Visit  Medication Sig Dispense Refill  . albuterol (PROVENTIL HFA;VENTOLIN HFA) 108 (90 BASE) MCG/ACT inhaler Inhale 1-2 puffs into the  lungs every 4 (four) hours as needed for wheezing or shortness of breath.  6.7 g  0  . aspirin 81 MG EC tablet Take 1 tablet (81 mg total) by mouth daily. Swallow whole.  30 tablet  12  . hydrocortisone (ANUSOL-HC) 25 MG suppository Place 1 suppository (25 mg total) rectally 2 (two) times daily.  12 suppository  0  . levofloxacin (LEVAQUIN) 750 MG tablet Take 1 tablet (750 mg total) by mouth daily.  4 tablet  0  . losartan-hydrochlorothiazide (HYZAAR) 50-12.5 MG per tablet Take 1 tablet by mouth daily.      Marland Kitchen NIFEdipine (PROCARDIA XL) 90 MG 24 hr tablet Take 1 tablet (90 mg total) by mouth daily.  90 tablet  3  . potassium chloride SA (K-DUR,KLOR-CON) 20 MEQ tablet Take 1 tablet (20 mEq total) by mouth daily.  30 tablet  0  . traZODone (DESYREL) 50 MG tablet Take 50 mg by mouth at bedtime.      . benzonatate (TESSALON) 100 MG capsule Take 1 capsule (100 mg total) by mouth 3 (three) times daily as needed for cough.  20 capsule  0   No facility-administered medications prior to visit.   Allergies  Allergen Reactions  . Ace Inhibitors Cough       Patient Active Problem List   Diagnosis Date Noted  . Hemorrhoids 05/02/2013  . Acute renal failure 05/02/2013  . CAP (community acquired pneumonia) 05/01/2013  . Sepsis 05/01/2013  . Hypokalemia 05/01/2013  . Hyponatremia 05/01/2013  . CKD (chronic kidney disease), stage II 05/01/2013  . Hyperglycemia 05/01/2013  .  Atherosclerosis of native arteries of the extremities with intermittent claudication 10/14/2012  . Osteoarthritis 09/08/2012  . Smoker 09/08/2012  . Insomnia 09/08/2012  . Hyperlipidemia 09/08/2012  . Heart murmur, aortic 09/08/2012  . Bibasilar crackles 09/08/2012  . Preventative health care 09/02/2012  . Hypertension   . PVD (peripheral vascular disease)    History  Substance Use Topics  . Smoking status: Current Every Day Smoker -- 1.00 packs/day for 20 years    Types: Cigarettes  . Smokeless tobacco: Never Used  .  Alcohol Use: 0.6 oz/week    1 Glasses of wine per week     Comment: occassional   family history is not on file. Family history is unknown by patient.  Objective:   Physical Exam well-developed older African American female in no acute distress, pleasant blood pressure 102/50 pulse 80 height 5 foot 2 weight 167. HEENT; nontraumatic normocephalic EOMI PERRLA sclera anicteric, Neck; supple no JVD, Cardiovascular ;regular rate and rhythm with S1-S2 she has a systolic murmur, Pulmonary; some decreased breath sounds bilateral bases, Abdomen; soft nontender nondistended bowel sounds are active there is no palpable mass or hepatosplenomegaly, Rectal ;exam she has a large friable external hemorrhoid which is nontender but oozing some blood on digital exam stool is loose mucoid and has a small amount of fresh heme as well,Ext;no clubbing cyanosis or edema skin warm and dry, Psych; mood and affect normal and appropriate        Assessment & Plan:  #39 67 year old female with change in bowel habits with mild diarrhea and hematochezia x1 week, her bleeding may be hemorrhoidal as she has an obvious large friable external hemorrhoid however cannot rule out other occult colon lesion. Change in bowel habits may be antibiotic related diarrhea, since persisting will rule out C. Difficile.  #2 external hemorrhoid #3 recent community acquired pneumonia #4 chronic kidney disease #5 hypertension #6 hyperlipidemia #7 peripheral vascular disease #8 probable mild oral thrush  Plan; will check CBC today Check stool for C. difficile by PCR Mycostatin oral suspension 5 cc 4 times daily x7 days Anusol-HC cream 3-4 times daily over the next 2 weeks for external hemorrhoidal symptoms  Schedule for colonoscopy with Dr. Garner Gavel was discussed in detail with the patient and she is agreeable to proceed. She was concerned last time that she could not afford the prep and was given a sample prep today.

## 2013-05-08 NOTE — Progress Notes (Signed)
Subjective:    Patient ID: Elaine Turner, female    DOB: 1946/03/29, 67 y.o.   MRN: [id number]  HPI  Here to f/u left CAP multilobar, with sepsis, now much improved stamina, and since d/c no fever, ST, cough, sob, wheezing.  Has some left post pleuritic chest pain.   Finished antibx today, saw GI earlier.  CBC already back with elev WBC, stable Hgb. Long time smoker since 67yo.  Also trazodone not working for sleep. Did not tolerate ambien very well before as well.  Wants to quit smoking, plans to try, but without chantix as did not help before  Asks for change of klorcon 20 as the pills are too large.  Pt denies new neurological symptoms such as new headache, or facial or extremity weakness or numbness   Pt denies polydipsia, polyuria. Past Medical History  Diagnosis Date  . Hypertension   . PVD (peripheral vascular disease)   . Arthritis   . Heart murmur   . Urine incontinence   . Pneumonia 05/01/2013  . Chronic diarrhea   . Hyperglycemia 05/01/2013   Past Surgical History  Procedure Laterality Date  . Abdominal hysterectomy      reports that she has been smoking Cigarettes.  She has a 20 pack-year smoking history. She has never used smokeless tobacco. She reports that she drinks about 0.6 ounces of alcohol per week. She reports that she does not use illicit drugs. family history is not on file. Allergies  Allergen Reactions  . Ace Inhibitors Cough   Current Outpatient Prescriptions on File Prior to Visit  Medication Sig Dispense Refill  . albuterol (PROVENTIL HFA;VENTOLIN HFA) 108 (90 BASE) MCG/ACT inhaler Inhale 1-2 puffs into the lungs every 4 (four) hours as needed for wheezing or shortness of breath.  6.7 g  0  . aspirin 81 MG EC tablet Take 1 tablet (81 mg total) by mouth daily. Swallow whole.  30 tablet  12  . benzonatate (TESSALON) 100 MG capsule Take 1 capsule (100 mg total) by mouth 3 (three) times daily as needed for cough.  20 capsule  0  . hydrocortisone (ANUSOL-HC) 25 MG  suppository Place 1 suppository (25 mg total) rectally 2 (two) times daily.  12 suppository  0  . levofloxacin (LEVAQUIN) 750 MG tablet Take 1 tablet (750 mg total) by mouth daily.  4 tablet  0  . losartan-hydrochlorothiazide (HYZAAR) 50-12.5 MG per tablet Take 1 tablet by mouth daily.      Marland Kitchen NIFEdipine (PROCARDIA XL) 90 MG 24 hr tablet Take 1 tablet (90 mg total) by mouth daily.  90 tablet  3  . nystatin (MYCOSTATIN) 100000 UNIT/ML suspension Take 5 cc, swish and spit 4 times daily for 7 days.  140 mL  0   No current facility-administered medications on file prior to visit.   Review of Systems  Constitutional: Negative for unexpected weight change, or unusual diaphoresis  HENT: Negative for tinnitus.   Eyes: Negative for photophobia and visual disturbance.  Respiratory: Negative for choking and stridor.   Gastrointestinal: Negative for vomiting and blood in stool.  Genitourinary: Negative for hematuria and decreased urine volume.  Musculoskeletal: Negative for acute joint swelling Skin: Negative for color change and wound.  Neurological: Negative for tremors and numbness other than noted  Psychiatric/Behavioral: Negative for decreased concentration or  hyperactivity.       Objective:   Physical Exam BP 122/58  Pulse 80  Temp(Src) 98.4 F (36.9 C) (Oral)  Ht 5\' 2"  (1.575 m)  Wt 167 lb (75.751 kg)  BMI 30.54 kg/m2  SpO2 93% VS noted,  Constitutional: Pt appears well-developed and well-nourished.  HENT: Head: NCAT.  Right Ear: External ear normal.  Left Ear: External ear normal.  Eyes: Conjunctivae and EOM are normal. Pupils are equal, round, and reactive to light.  Neck: Normal range of motion. Neck supple.  Cardiovascular: Normal rate and regular rhythm.   Pulmonary/Chest: Effort normal and breath sounds normal. except for few left basilar rales Abd:  Soft, NT, non-distended, + BS Neurological: Pt is alert. Not confused  Skin: Skin is warm. No erythema.  Psychiatric: Pt  behavior is normal. Thought content normal.     Assessment & Plan:

## 2013-05-08 NOTE — Assessment & Plan Note (Signed)
Ok to change to temazepam,  to f/u any worsening symptoms or concerns

## 2013-05-09 ENCOUNTER — Other Ambulatory Visit: Payer: Medicare Other

## 2013-05-09 DIAGNOSIS — R197 Diarrhea, unspecified: Secondary | ICD-10-CM

## 2013-05-09 DIAGNOSIS — D649 Anemia, unspecified: Secondary | ICD-10-CM

## 2013-05-09 DIAGNOSIS — K644 Residual hemorrhoidal skin tags: Secondary | ICD-10-CM

## 2013-05-09 DIAGNOSIS — K625 Hemorrhage of anus and rectum: Secondary | ICD-10-CM

## 2013-05-10 LAB — CLOSTRIDIUM DIFFICILE BY PCR: Toxigenic C. Difficile by PCR: NOT DETECTED

## 2013-05-11 ENCOUNTER — Telehealth: Payer: Self-pay | Admitting: *Deleted

## 2013-05-11 NOTE — Telephone Encounter (Signed)
Left msg on triage md was suppose to rx something to help her sleep. Called [facility] to verify tem[facility]epam rx was fax over. Per sylvia they didn't received. Gave order verbally. Notified pt [facility]...lmb

## 2013-05-12 ENCOUNTER — Encounter: Payer: Self-pay | Admitting: Internal Medicine

## 2013-05-15 ENCOUNTER — Encounter: Payer: Self-pay | Admitting: Internal Medicine

## 2013-05-15 ENCOUNTER — Telehealth: Payer: Self-pay

## 2013-05-15 ENCOUNTER — Other Ambulatory Visit (INDEPENDENT_AMBULATORY_CARE_PROVIDER_SITE_OTHER): Payer: Medicare Other

## 2013-05-15 ENCOUNTER — Ambulatory Visit (INDEPENDENT_AMBULATORY_CARE_PROVIDER_SITE_OTHER): Payer: Medicare Other | Admitting: Internal Medicine

## 2013-05-15 VITALS — BP 130/56 | HR 66 | Temp 98.2°F | Wt 168.0 lb

## 2013-05-15 DIAGNOSIS — J189 Pneumonia, unspecified organism: Secondary | ICD-10-CM

## 2013-05-15 DIAGNOSIS — D649 Anemia, unspecified: Secondary | ICD-10-CM | POA: Insufficient documentation

## 2013-05-15 DIAGNOSIS — N182 Chronic kidney disease, stage 2 (mild): Secondary | ICD-10-CM

## 2013-05-15 DIAGNOSIS — I129 Hypertensive chronic kidney disease with stage 1 through stage 4 chronic kidney disease, or unspecified chronic kidney disease: Secondary | ICD-10-CM

## 2013-05-15 DIAGNOSIS — R7309 Other abnormal glucose: Secondary | ICD-10-CM

## 2013-05-15 DIAGNOSIS — R739 Hyperglycemia, unspecified: Secondary | ICD-10-CM

## 2013-05-15 DIAGNOSIS — I10 Essential (primary) hypertension: Secondary | ICD-10-CM

## 2013-05-15 DIAGNOSIS — E876 Hypokalemia: Secondary | ICD-10-CM

## 2013-05-15 LAB — CBC WITH DIFFERENTIAL/PLATELET
Basophils Absolute: 0.1 10*3/uL (ref 0.0–0.1)
Basophils Relative: 0.4 % (ref 0.0–3.0)
Eosinophils Absolute: 0.3 10*3/uL (ref 0.0–0.7)
Eosinophils Relative: 1.7 % (ref 0.0–5.0)
HCT: 30.1 % — ABNORMAL LOW (ref 36.0–46.0)
Hemoglobin: 9.7 g/dL — ABNORMAL LOW (ref 12.0–15.0)
Lymphocytes Relative: 13.7 % (ref 12.0–46.0)
Lymphs Abs: 2.2 10*3/uL (ref 0.7–4.0)
MCHC: 32.3 g/dL (ref 30.0–36.0)
MCV: 73.6 fl — ABNORMAL LOW (ref 78.0–100.0)
Monocytes Absolute: 0.7 10*3/uL (ref 0.1–1.0)
Monocytes Relative: 4.3 % (ref 3.0–12.0)
Neutro Abs: 12.9 10*3/uL — ABNORMAL HIGH (ref 1.4–7.7)
Neutrophils Relative %: 79.9 % — ABNORMAL HIGH (ref 43.0–77.0)
Platelets: 689 10*3/uL — ABNORMAL HIGH (ref 150.0–400.0)
RBC: 4.09 Mil/uL (ref 3.87–5.11)
RDW: 16.9 % — ABNORMAL HIGH (ref 11.5–14.6)
WBC: 16.1 10*3/uL — ABNORMAL HIGH (ref 4.5–10.5)

## 2013-05-15 LAB — BASIC METABOLIC PANEL
BUN: 19 mg/dL (ref 6–23)
CO2: 28 mEq/L (ref 19–32)
Calcium: 9.3 mg/dL (ref 8.4–10.5)
Chloride: 102 mEq/L (ref 96–112)
Creatinine, Ser: 1.3 mg/dL — ABNORMAL HIGH (ref 0.4–1.2)
GFR: 50.4 mL/min — ABNORMAL LOW (ref >60.00)
Glucose, Bld: 90 mg/dL (ref 70–99)
Potassium: 4 mEq/L (ref 3.5–5.1)
Sodium: 139 mEq/L (ref 135–145)

## 2013-05-15 NOTE — Telephone Encounter (Signed)
Message copied by Noreene Larsson on Mon May 15, 2013  1:48 PM ------      Message from: Corwin Levins      Created: Mon May 15, 2013 12:45 PM       Letter sent, cont same tx except:            The test results show that your current treatment is OK, except there is still elevated white blood cells, and platelets in addition to the anemia that seems overall about the same.  It is not clear to me that this is all related to your recent illness.  We should have you repeat the test in 1 wk, but if not improved, we should consider a Hematology referral.    There is no other need for change of treatment or further evaluation based on these results which are normal or stable.  Please continue the same plan for further evaluation and treatment as discussed at your office visit.             Robin to inform pt, and ask pt to return for Lab only in 1 wk - cbc - 285.9 ------

## 2013-05-15 NOTE — Assessment & Plan Note (Signed)
With recent low K - for f/u bmet today, asked to reduce po fluids, only drink if thirsty, cont current meds

## 2013-05-15 NOTE — Telephone Encounter (Signed)
Pt notified and will return in 1 week

## 2013-05-15 NOTE — Progress Notes (Signed)
Subjective:    Patient ID: Elaine Turner, female    DOB: [birth year], 67 y.o.   MRN: [id number]  HPI  Here to f/u, could not tolerate the chantix due to n/v only with taking that particular med;  Has been having increase ankle edema since last wk, states good med compliance but drinks LOTS of fluids.  Pt denies fever, cough,wt loss, night sweats, loss of appetite, or other constitutional symptoms.  Has been sleeping in recliner for 5 yrs b/c she cannot sleep in the bedroom since her husband died.  Temazepam working well for sleep.  Pt denies new neurological symptoms such as new headache, or facial or extremity weakness or numbness   Pt denies polydipsia, polyuria. Tolerating K pills ok Past Medical History  Diagnosis Date  . Hypertension   . PVD (peripheral vascular disease)   . Arthritis   . Heart murmur   . Urine incontinence   . Pneumonia 05/01/2013  . Chronic diarrhea   . Hyperglycemia 05/01/2013   Past Surgical History  Procedure Laterality Date  . Abdominal hysterectomy      reports that she has been smoking Cigarettes.  She has a 20 pack-year smoking history. She has never used smokeless tobacco. She reports that she drinks about 0.6 ounces of alcohol per week. She reports that she does not use illicit drugs. family history is not on file. Allergies  Allergen Reactions  . Ace Inhibitors Cough  . Chantix (Varenicline) Nausea And Vomiting   Current Outpatient Prescriptions on File Prior to Visit  Medication Sig Dispense Refill  . albuterol (PROVENTIL HFA;VENTOLIN HFA) 108 (90 BASE) MCG/ACT inhaler Inhale 1-2 puffs into the lungs every 4 (four) hours as needed for wheezing or shortness of breath.  6.7 g  0  . aspirin 81 MG EC tablet Take 1 tablet (81 mg total) by mouth daily. Swallow whole.  30 tablet  12  . benzonatate (TESSALON) 100 MG capsule Take 1 capsule (100 mg total) by mouth 3 (three) times daily as needed for cough.  20 capsule  0  . hydrocortisone (ANUSOL-HC) 25 MG  suppository Place 1 suppository (25 mg total) rectally 2 (two) times daily.  12 suppository  0  . levofloxacin (LEVAQUIN) 750 MG tablet Take 1 tablet (750 mg total) by mouth daily.  4 tablet  0  . losartan-hydrochlorothiazide (HYZAAR) 50-12.5 MG per tablet Take 1 tablet by mouth daily.      Marland Kitchen NIFEdipine (PROCARDIA XL) 90 MG 24 hr tablet Take 1 tablet (90 mg total) by mouth daily.  90 tablet  3  . nystatin (MYCOSTATIN) 100000 UNIT/ML suspension Take 5 cc, swish and spit 4 times daily for 7 days.  140 mL  0  . potassium chloride (KLOR-CON 10) 10 MEQ tablet Take 2 tablets (20 mEq total) by mouth daily.  180 tablet  3  . temazepam (RESTORIL) 15 MG capsule 1-2 tabs by mouth at bedtime as needed for sleep  60 capsule  2   No current facility-administered medications on file prior to visit.   Review of Systems  Constitutional: Negative for unexpected weight change, or unusual diaphoresis  HENT: Negative for tinnitus.   Eyes: Negative for photophobia and visual disturbance.  Respiratory: Negative for choking and stridor.   Gastrointestinal: Negative for vomiting and blood in stool.  Genitourinary: Negative for hematuria and decreased urine volume.  Musculoskeletal: Negative for acute joint swelling Skin: Negative for color change and wound.  Neurological: Negative for tremors and numbness other than noted  Psychiatric/Behavioral: Negative for decreased concentration or  hyperactivity.       Objective:   Physical Exam BP 130/56  Pulse 66  Temp(Src) 98.2 F (36.8 C) (Oral)  Wt 168 lb (76.204 kg)  BMI 30.72 kg/m2  SpO2 98% VS noted,  Constitutional: Pt appears well-developed and well-nourished.  HENT: Head: NCAT.  Right Ear: External ear normal.  Left Ear: External ear normal.  Eyes: Conjunctivae and EOM are normal. Pupils are equal, round, and reactive to light.  Neck: Normal range of motion. Neck supple.  Cardiovascular: Normal rate and regular rhythm.   Pulmonary/Chest: Effort normal  and breath sounds normal except for few persistent left basilar rales - ? Dry rales  Abd:  Soft, NT, non-distended, + BS Neurological: Pt is alert. Not confused , motor intact Skin: Skin is warm. No erythema. 1+ ankle edema Psychiatric: Pt behavior is normal. Thought content normal.  Mild nervous    Assessment & Plan:

## 2013-05-15 NOTE — Assessment & Plan Note (Signed)
stable overall by history and exam, recent data reviewed with pt, and pt to continue medical treatment as before,  to f/u any worsening symptoms or concerns BP Readings from Last 3 Encounters:  05/15/13 130/56  05/08/13 122/58  05/08/13 102/50

## 2013-05-15 NOTE — Patient Instructions (Addendum)
OK to stop the Chantix Please stop smoking Please do not drink more fluids unless you are thirsty Please continue all other medications as before Please go to the LAB in the Basement (turn left off the elevator) for the tests to be done today You will be contacted by phone if any changes need to be made immediately.  Otherwise, you will receive a letter about your results with an explanation Please remember to sign up for My Chart if you have not done so, as this will be important to you in the future with finding out test results, communicating by private email, and scheduling acute appointments online when needed.

## 2013-05-15 NOTE — Assessment & Plan Note (Signed)
Cont current med and hct in the hyzaar,  to f/u any worsening symptoms or concerns

## 2013-05-15 NOTE — Assessment & Plan Note (Signed)
Clinically resolved,  to f/u any worsening symptoms or concerns 

## 2013-05-15 NOTE — Assessment & Plan Note (Signed)
Asympt, stable overall by history and exam, recent data reviewed with pt, and pt to continue medical treatment as before,  to f/u any worsening symptoms or concerns Lab Results  Component Value Date   HGBA1C 5.8* 05/01/2013

## 2013-05-15 NOTE — Assessment & Plan Note (Signed)
If worse, could be related to edema, for f/u cbc today

## 2013-05-17 ENCOUNTER — Encounter: Payer: Self-pay | Admitting: Internal Medicine

## 2013-05-17 ENCOUNTER — Ambulatory Visit (AMBULATORY_SURGERY_CENTER): Payer: Medicare Other | Admitting: Internal Medicine

## 2013-05-17 VITALS — BP 149/61 | HR 67 | Temp 97.6°F | Resp 16 | Ht 62.0 in | Wt 167.0 lb

## 2013-05-17 DIAGNOSIS — K625 Hemorrhage of anus and rectum: Secondary | ICD-10-CM

## 2013-05-17 DIAGNOSIS — D649 Anemia, unspecified: Secondary | ICD-10-CM

## 2013-05-17 DIAGNOSIS — K644 Residual hemorrhoidal skin tags: Secondary | ICD-10-CM

## 2013-05-17 DIAGNOSIS — K5730 Diverticulosis of large intestine without perforation or abscess without bleeding: Secondary | ICD-10-CM

## 2013-05-17 DIAGNOSIS — R197 Diarrhea, unspecified: Secondary | ICD-10-CM

## 2013-05-17 MED ORDER — SODIUM CHLORIDE 0.9 % IV SOLN: 500.0000 mL | INTRAVENOUS | Status: DC

## 2013-05-17 NOTE — Progress Notes (Signed)
Patient did not experience any of the following events: a burn prior to discharge; a fall within the facility; wrong site/side/patient/procedure/implant event; or a hospital transfer or hospital admission upon discharge from the facility. (G8907) Patient did not have preoperative order for IV antibiotic SSI prophylaxis. (G8918)  

## 2013-05-17 NOTE — Patient Instructions (Addendum)
Severe diverticulosis.  See handout for information.  YOU HAD AN ENDOSCOPIC PROCEDURE TODAY AT THE [facility]: Refer to the procedure report that was given to you for any specific questions about what was found during the examination.  If the procedure report does not answer your questions, please call your gastroenterologist to clarify.  If you requested that your care partner not be given the details of your procedure findings, then the procedure report has been included in a sealed envelope for you to review at your convenience later.  YOU SHOULD EXPECT: Some feelings of bloating in the abdomen. Passage of more gas than usual.  Walking can help get rid of the air that was put into your GI tract during the procedure and reduce the bloating. If you had a lower endoscopy (such as a colonoscopy or flexible sigmoidoscopy) you may notice spotting of blood in your stool or on the toilet paper. If you underwent a bowel prep for your procedure, then you may not have a normal bowel movement for a few days.  DIET: Your first meal following the procedure should be a light meal and then it is ok to progress to your normal diet.  A half-sandwich or bowl of soup is an example of a good first meal.  Heavy or fried foods are harder to digest and may make you feel nauseous or bloated.  Likewise meals heavy in dairy and vegetables can cause extra gas to form and this can also increase the bloating.  Drink plenty of fluids but you should avoid alcoholic beverages for 24 hours.  ACTIVITY: Your care partner should take you home directly after the procedure.  You should plan to take it easy, moving slowly for the rest of the day.  You can resume normal activity the day after the procedure however you should NOT DRIVE or use heavy machinery for 24 hours (because of the sedation medicines used during the test).    SYMPTOMS TO REPORT IMMEDIATELY: A gastroenterologist can be reached at any hour.  During normal  business hours, 8:30 AM to 5:00 PM Monday through Friday, call [phone].  After hours and on weekends, please call the GI answering service at [phone] who will take a message and have the physician on call contact you.   Following lower endoscopy (colonoscopy or flexible sigmoidoscopy):  Excessive amounts of blood in the stool  Significant tenderness or worsening of abdominal pains  Swelling of the abdomen that is new, acute  Fever of 100F or higher   FOLLOW UP: If any biopsies were taken you will be contacted by phone or by letter within the next 1-3 weeks.  Call your gastroenterologist if you have not heard about the biopsies in 3 weeks.  Our staff will call the home number listed on your records the next business day following your procedure to check on you and address any questions or concerns that you may have at that time regarding the information given to you following your procedure. This is a courtesy call and so if there is no answer at the home number and we have not heard from you through the emergency physician on call, we will assume that you have returned to your regular daily activities without incident.  SIGNATURES/CONFIDENTIALITY: You and/or your care partner have signed paperwork which will be entered into your electronic medical record.  These signatures attest to the fact that that the information above on your After Visit Summary has been reviewed and is  understood.  Full responsibility of the confidentiality of this discharge information lies with you and/or your care-partner. 

## 2013-05-17 NOTE — Progress Notes (Signed)
Stable to RR 

## 2013-05-17 NOTE — Op Note (Signed)
[facility] [address] [place], [address]   COLONOSCOPY PROCEDURE REPORT  PATIENT: Elaine, Turner  MR#: [id number] BIRTHDATE: 03/19/1946 , 67  yrs. old GENDER: Female ENDOSCOPIST: Roxy Cedar, MD REFERRED BY:.  Self / Office PROCEDURE DATE:  05/17/2013 PROCEDURE:   Colonoscopy, diagnostic ASA CLASS:   Class II INDICATIONS:Anemia, non-specific and hematochezia.   Prior colonoscopy 1999 (no polyps) MEDICATIONS: MAC sedation, administered by CRNA and propofol (Diprivan) 200mg  IV  DESCRIPTION OF PROCEDURE:   After the risks benefits and alternatives of the procedure were thoroughly explained, informed consent was obtained.  A digital rectal exam revealed external hemorrhoids.   The LB [id number]  endoscope was introduced through the anus and advanced to the cecum, which was identified by both the appendix and ileocecal valve. No adverse events experienced.   The quality of the prep was excellent, using MoviPrep  The instrument was then slowly withdrawn as the colon was fully examined.      COLON FINDINGS: Severe diverticulosis was noted throughout the entire examined colon.   The colon was otherwise normal.  There was no inflammation, polyps or cancers .  Retroflexed views revealed internal hemorrhoids. The time to cecum=2 minutes 10 seconds. Withdrawal time=7 minutes 37 seconds.  The scope was withdrawn and the procedure completed.  COMPLICATIONS: There were no complications.  ENDOSCOPIC IMPRESSION: 1.   Severe diverticulosis was noted throughout the entire examined colon 2.   The colon was otherwise normal  RECOMMENDATIONS: 1. Return to the care of your primary provider.  GI follow up as needed   eSigned:  Roxy Cedar, MD 05/17/2013 12:16 PM   cc: Corwin Levins, MD and The Patient

## 2013-05-18 ENCOUNTER — Telehealth: Payer: Self-pay | Admitting: *Deleted

## 2013-05-18 NOTE — Telephone Encounter (Signed)
  Follow up Call-  Call back number 05/17/2013  Post procedure Call Back phone  # [phone]  Permission to leave phone message Yes     Patient questions:  Do you have a fever, pain , or abdominal swelling? no Pain Score  0 *  Have you tolerated food without any problems? yes  Have you been able to return to your normal activities? yes  Do you have any questions about your discharge instructions: Diet   no Medications  no Follow up visit  no  Do you have questions or concerns about your Care? no  Actions: * If pain score is 4 or above: No action needed, pain <4.

## 2013-08-16 ENCOUNTER — Ambulatory Visit: Payer: Medicare Other | Admitting: Internal Medicine

## 2013-08-16 DIAGNOSIS — Z0289 Encounter for other administrative examinations: Secondary | ICD-10-CM

## 2013-09-14 ENCOUNTER — Encounter: Payer: Self-pay | Admitting: Vascular Surgery

## 2013-09-15 ENCOUNTER — Ambulatory Visit: Payer: Medicare Other | Admitting: Vascular Surgery

## 2013-09-26 ENCOUNTER — Other Ambulatory Visit: Payer: Self-pay | Admitting: Internal Medicine

## 2014-02-09 ENCOUNTER — Encounter: Payer: Self-pay | Admitting: Internal Medicine

## 2014-02-09 ENCOUNTER — Encounter: Payer: Self-pay | Admitting: Family Medicine

## 2014-02-09 ENCOUNTER — Ambulatory Visit (INDEPENDENT_AMBULATORY_CARE_PROVIDER_SITE_OTHER): Payer: Medicare Other | Admitting: Family Medicine

## 2014-02-09 ENCOUNTER — Ambulatory Visit: Payer: Medicare Other | Admitting: Internal Medicine

## 2014-02-09 ENCOUNTER — Telehealth: Payer: Self-pay

## 2014-02-09 ENCOUNTER — Ambulatory Visit (INDEPENDENT_AMBULATORY_CARE_PROVIDER_SITE_OTHER): Payer: Medicare Other | Admitting: Internal Medicine

## 2014-02-09 ENCOUNTER — Ambulatory Visit (INDEPENDENT_AMBULATORY_CARE_PROVIDER_SITE_OTHER)
Admission: RE | Admit: 2014-02-09 | Discharge: 2014-02-09 | Disposition: A | Discharge status: Home / Self Care | Payer: Medicare Other | Source: Ambulatory Visit | Attending: Family Medicine | Admitting: Family Medicine

## 2014-02-09 VITALS — BP 142/60 | HR 81 | Temp 97.3°F | Resp 16 | Wt 179.8 lb

## 2014-02-09 VITALS — BP 142/60 | HR 81 | Temp 97.3°F | Ht 62.0 in | Wt 179.5 lb

## 2014-02-09 DIAGNOSIS — M25569 Pain in unspecified knee: Secondary | ICD-10-CM

## 2014-02-09 DIAGNOSIS — R918 Other nonspecific abnormal finding of lung field: Secondary | ICD-10-CM

## 2014-02-09 DIAGNOSIS — M25562 Pain in left knee: Secondary | ICD-10-CM

## 2014-02-09 DIAGNOSIS — R9389 Abnormal findings on diagnostic imaging of other specified body structures: Secondary | ICD-10-CM | POA: Insufficient documentation

## 2014-02-09 DIAGNOSIS — M1710 Unilateral primary osteoarthritis, unspecified knee: Secondary | ICD-10-CM | POA: Insufficient documentation

## 2014-02-09 DIAGNOSIS — R062 Wheezing: Secondary | ICD-10-CM

## 2014-02-09 DIAGNOSIS — J209 Acute bronchitis, unspecified: Secondary | ICD-10-CM

## 2014-02-09 IMAGING — CR DG CHEST 2V
2 series · 2 of 2 positions shown · non-contrast
Comparison: [DATE]

CLINICAL DATA: Cough, shortness of breath

EXAM:
CHEST  2 VIEW

[view not recorded (1 of 2)]
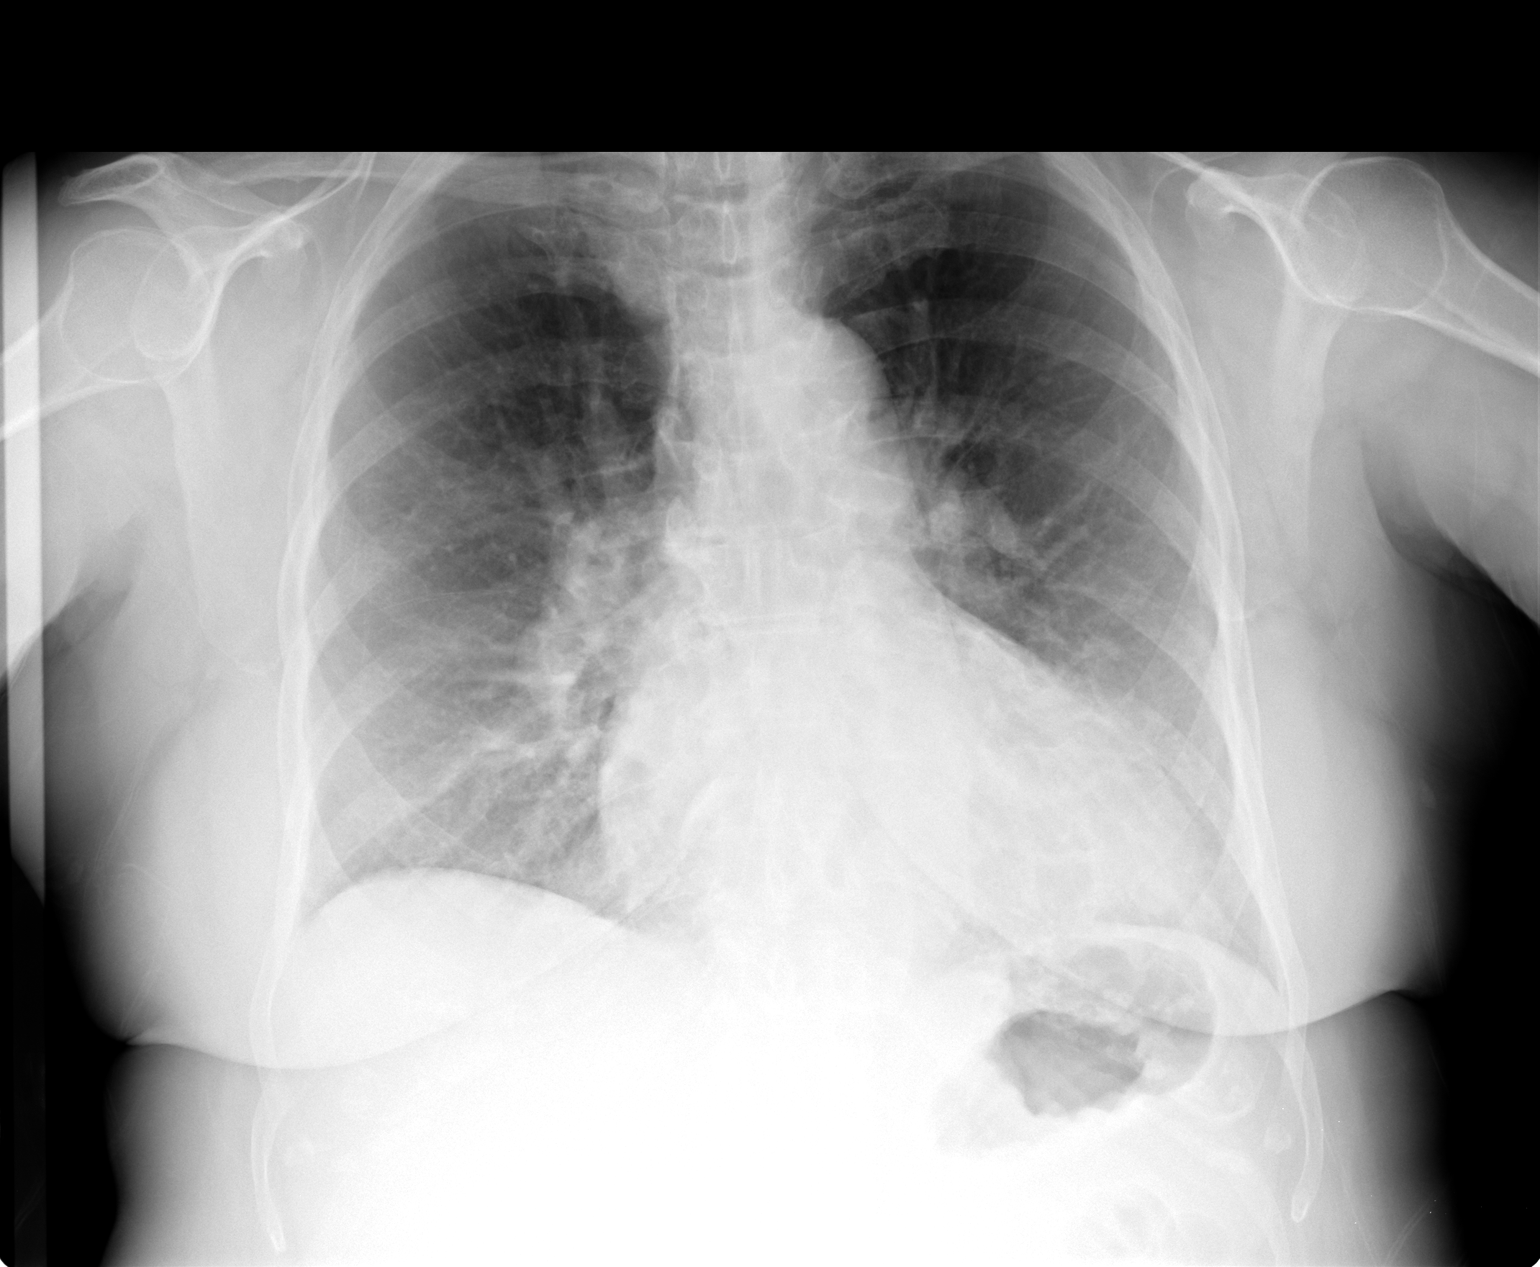

[view not recorded (2 of 2)]
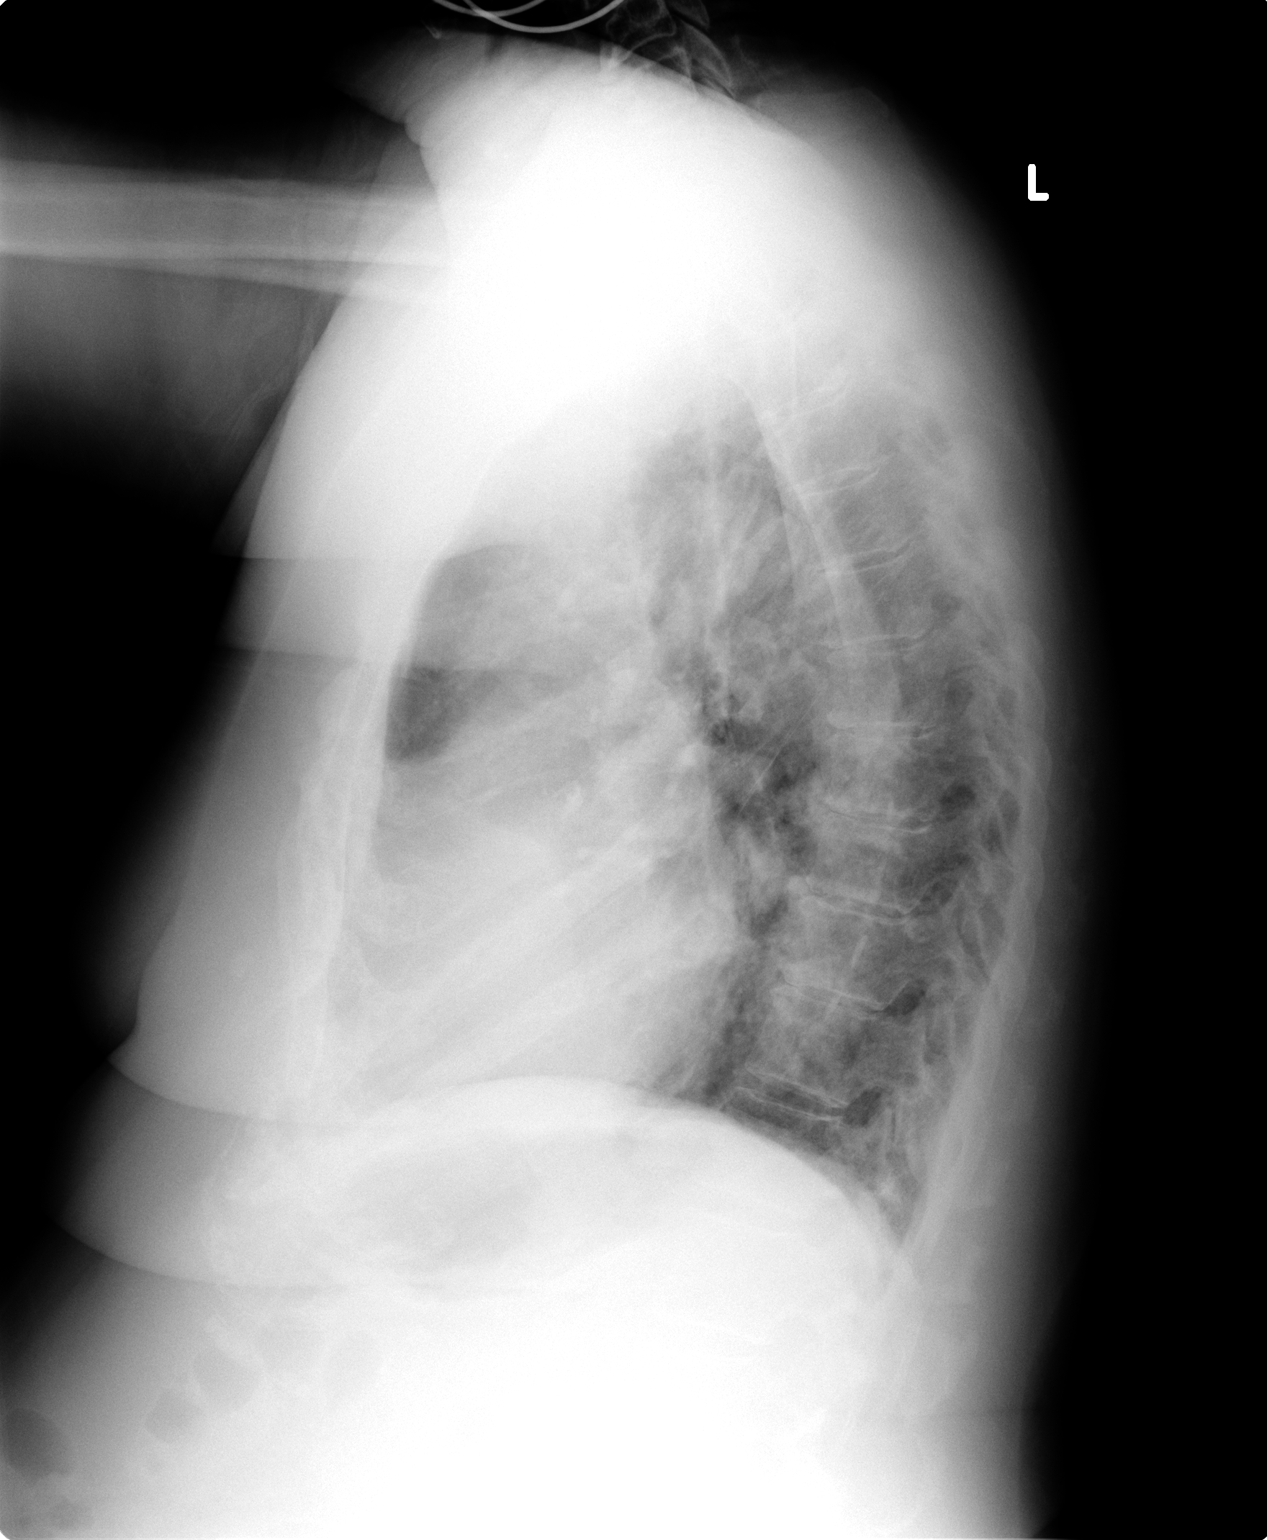

[2 of 2 positions shown; findings below may reference images not displayed]

FINDINGS: Lungs are essentially clear. Prior left upper lobe opacity has
resolved. No pleural effusion or pneumothorax.

The heart is normal in size.

Degenerative changes of the visualized thoracolumbar spine.
IMPRESSION: No evidence of acute cardiopulmonary disease.

## 2014-02-09 IMAGING — CR DG KNEE STANDING AP BILAT
1 series · 1 of 1 positions shown · non-contrast
Comparison: None.

CLINICAL DATA: Left knee pain for 3 weeks

EXAM:
BILATERAL KNEES STANDING - 1 VIEW

[view not recorded]
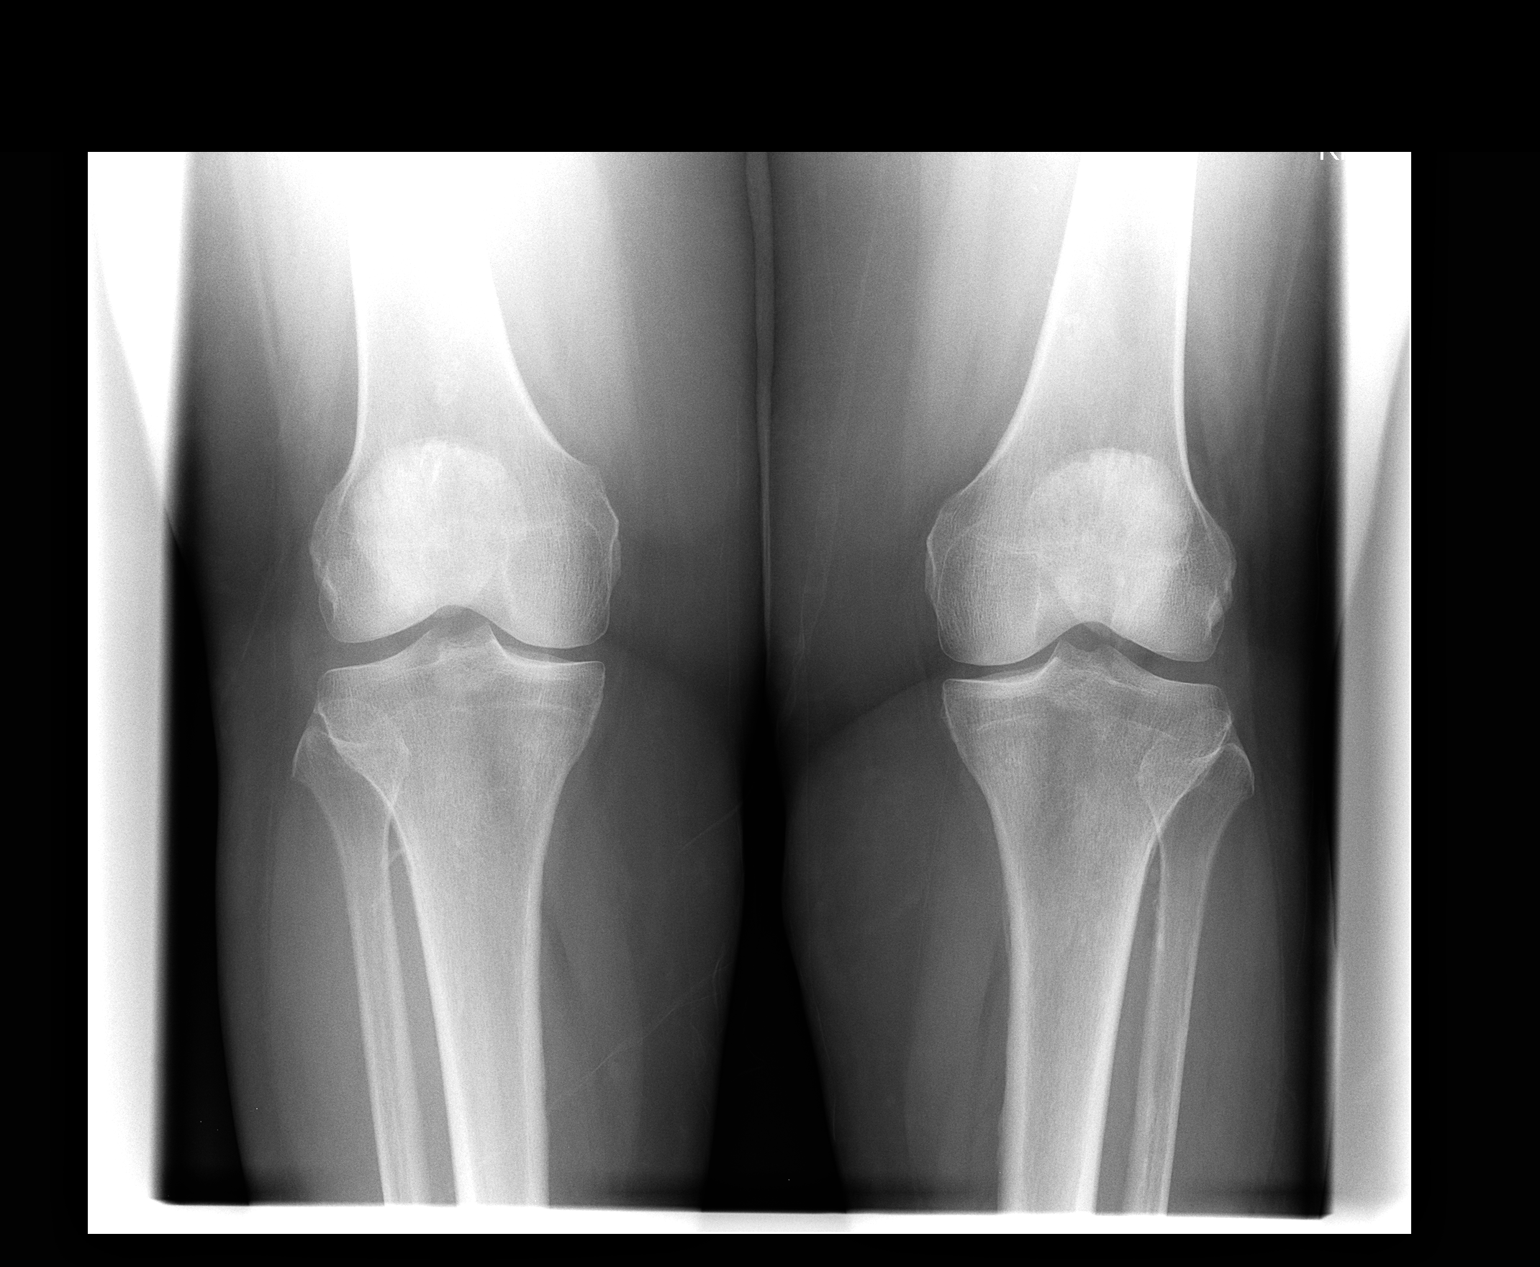

[1 of 1 positions shown; findings below may reference images not displayed]

FINDINGS: Single frontal view bilateral knee submitted. No acute fracture or
subluxation. Mild narrowing of medial joint compartment bilaterally.
IMPRESSION: No acute fracture or subluxation. Mild narrowing of medial joint
compartment bilaterally.

## 2014-02-09 IMAGING — CR DG KNEE COMPLETE 4+V*L*
4 series · 4 of 4 positions shown · non-contrast
Comparison: Frontal view bilateral knee same day

CLINICAL DATA: Left knee pain for 3 weeks

EXAM:
LEFT KNEE - COMPLETE 4+ VIEW

[view not recorded (1 of 4)]
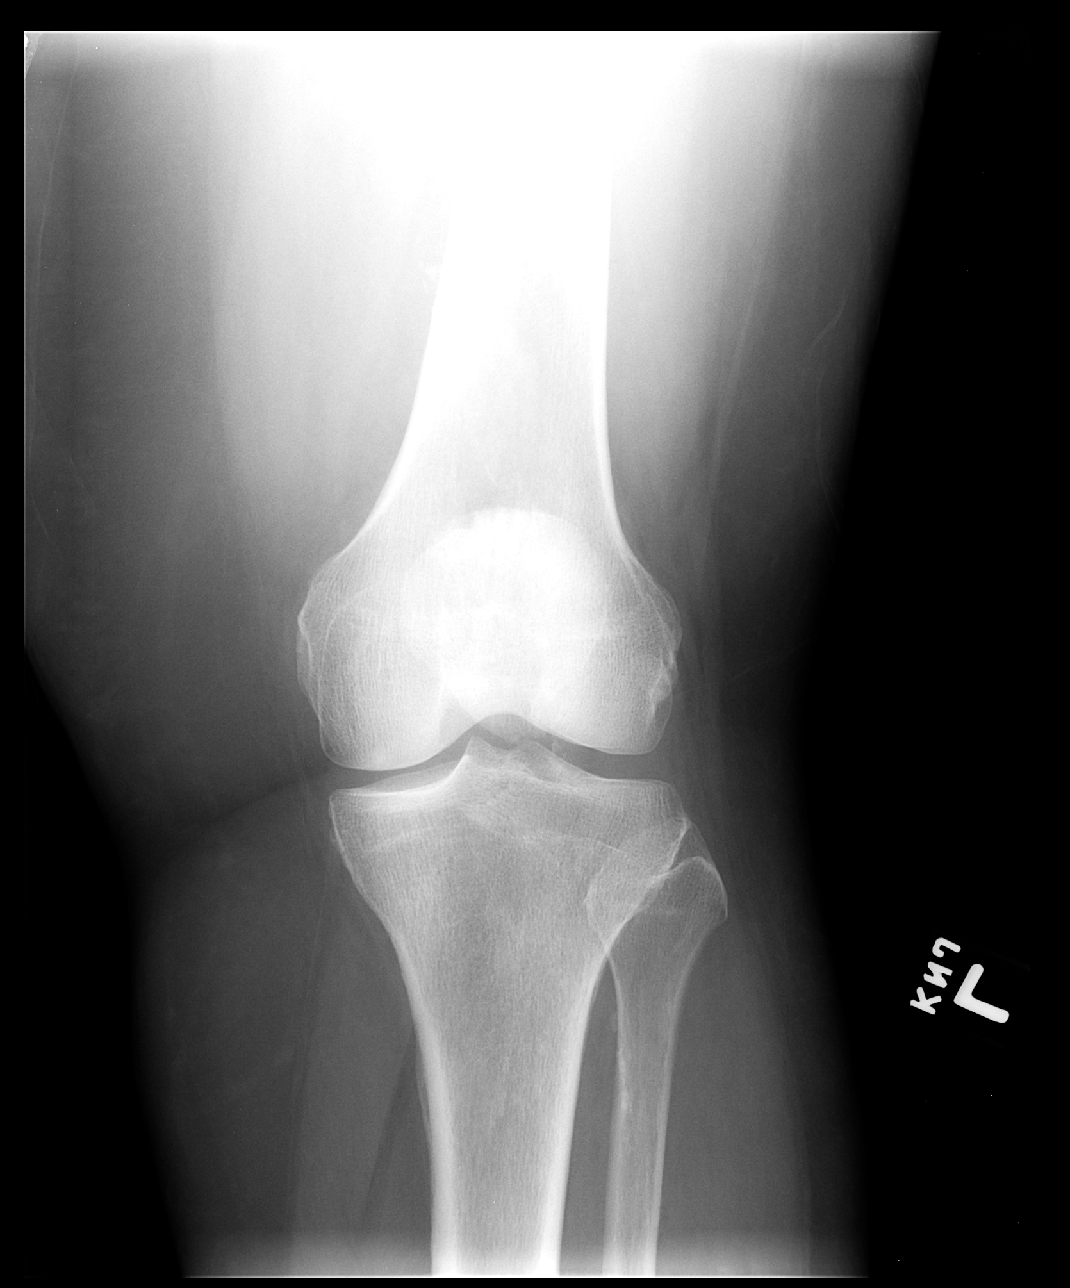

[view not recorded (2 of 4)]
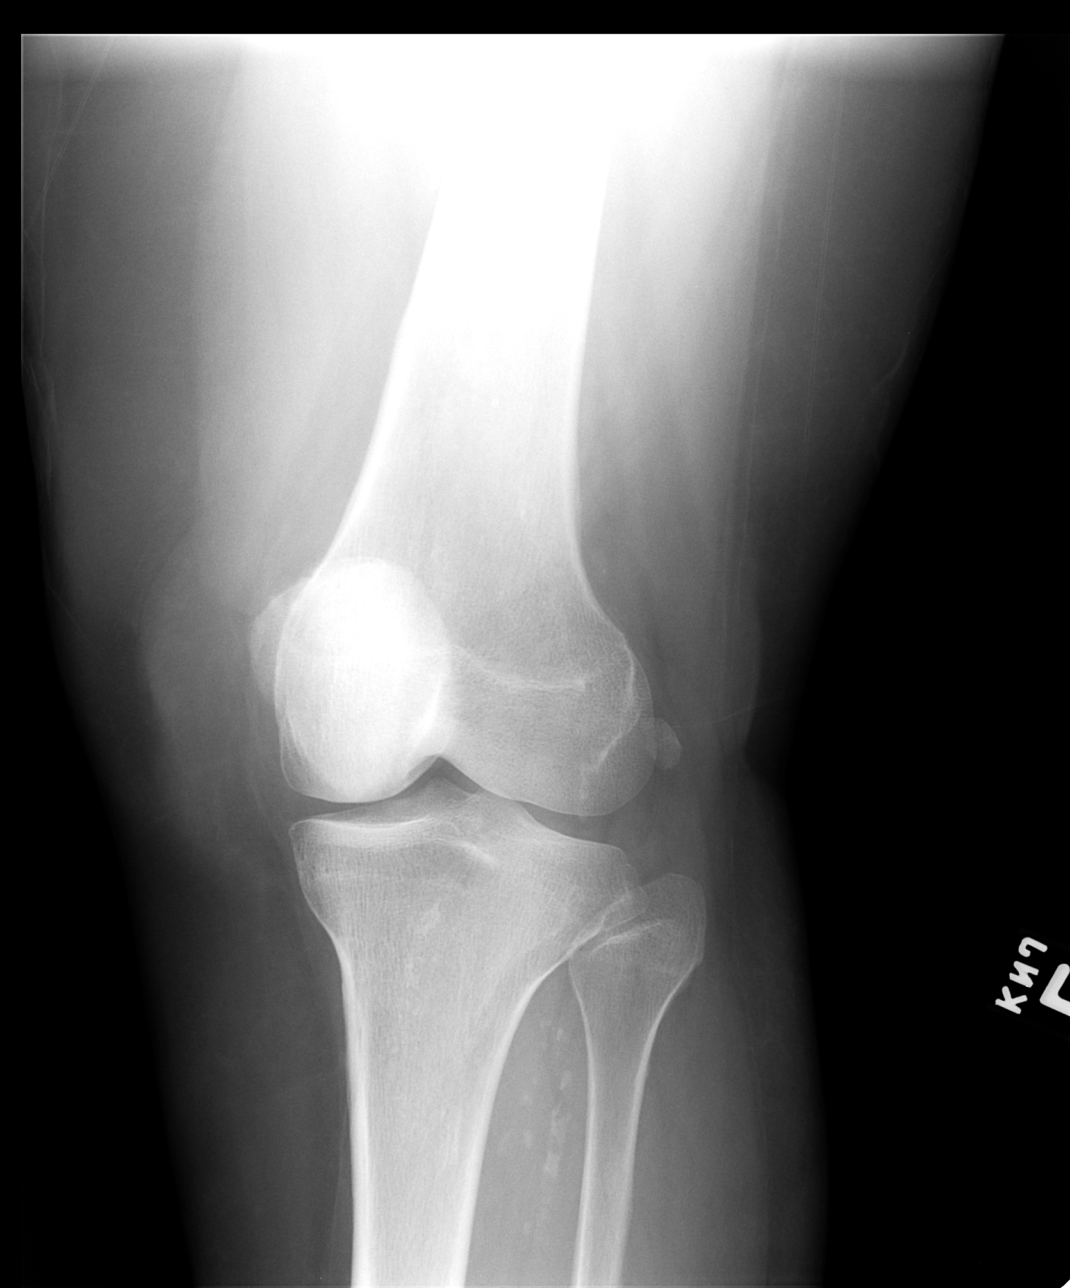

[view not recorded (3 of 4)]
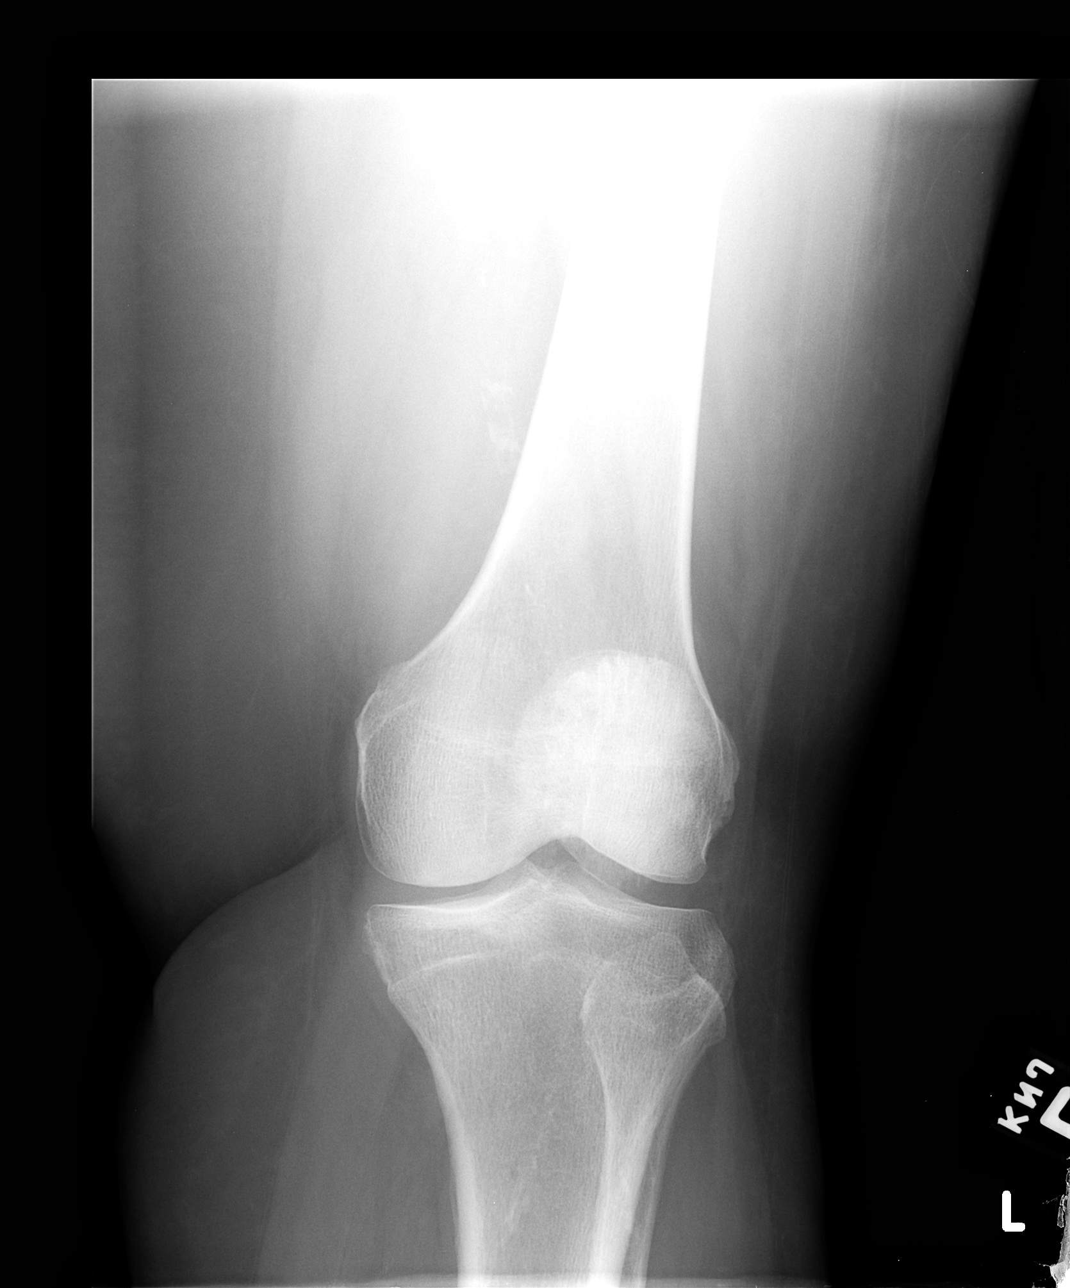

[view not recorded (4 of 4)]
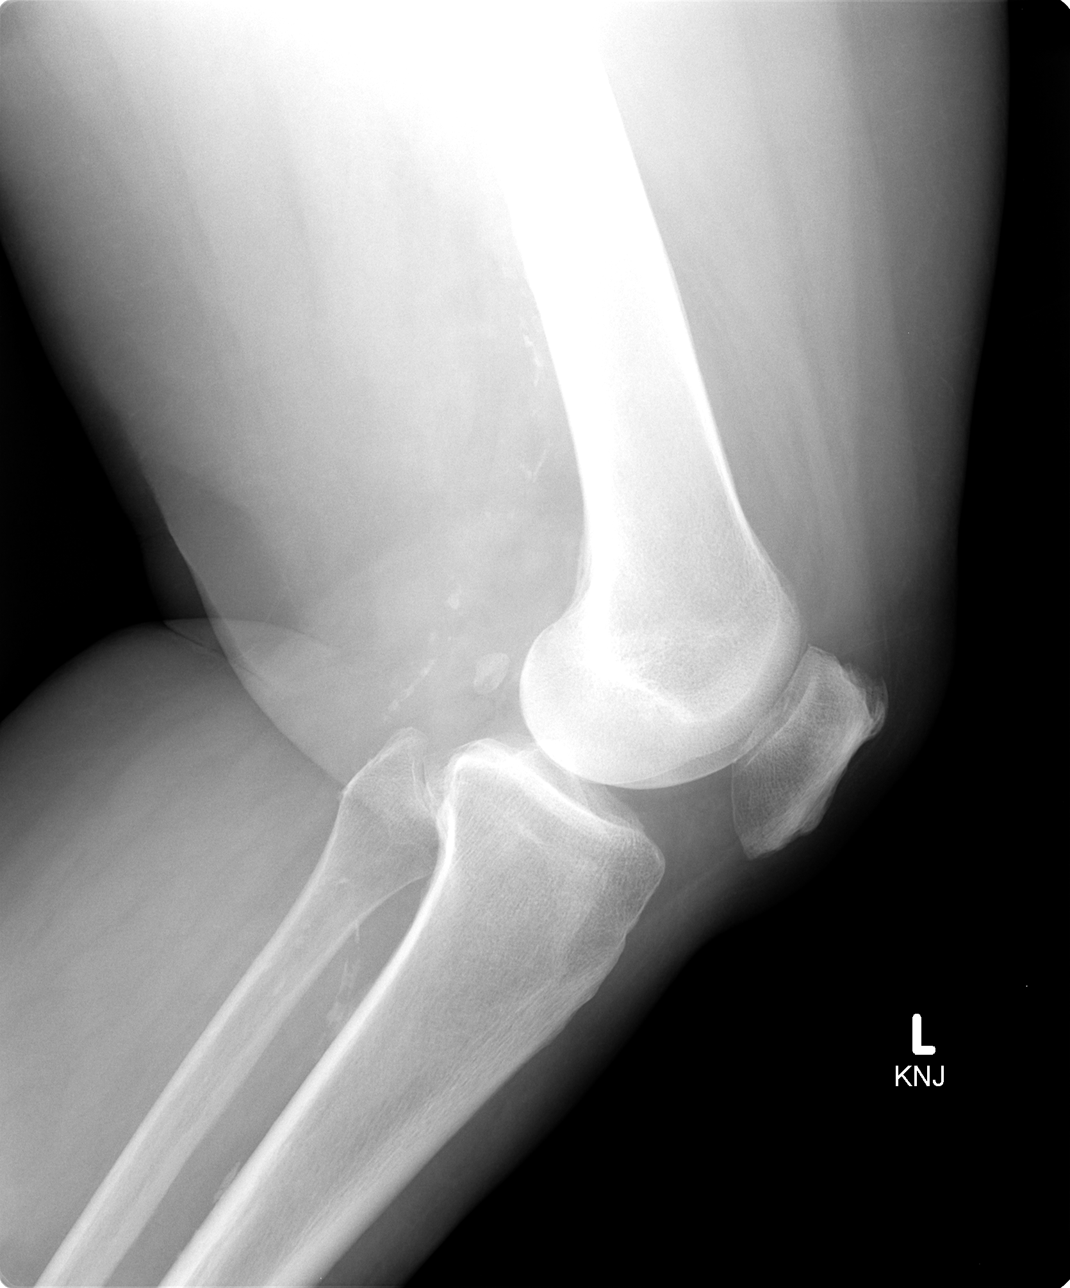

[4 of 4 positions shown; findings below may reference images not displayed]

FINDINGS: Four views of the left knee submitted. No acute fracture or
subluxation. Mild narrowing of medial joint compartment. Narrowing
of patellofemoral joint space. No joint effusion. Atherosclerotic
calcifications of femoral and popliteal artery.
IMPRESSION: No acute fracture or subluxation.  Minimal degenerative changes.

## 2014-02-09 MED ORDER — ALBUTEROL SULFATE HFA 108 (90 BASE) MCG/ACT IN AERS: 1.0000 | INHALATION_SPRAY | RESPIRATORY_TRACT | Status: DC | PRN

## 2014-02-09 MED ORDER — MELOXICAM 15 MG PO TABS: 15.0000 mg | ORAL_TABLET | Freq: Every day | ORAL | Status: DC

## 2014-02-09 MED ORDER — HYDROCODONE-HOMATROPINE 5-1.5 MG/5ML PO SYRP: 5.0000 mL | ORAL_SOLUTION | Freq: Four times a day (QID) | ORAL | Status: DC | PRN

## 2014-02-09 MED ORDER — LEVOFLOXACIN 250 MG PO TABS: 250.0000 mg | ORAL_TABLET | Freq: Every day | ORAL | Status: DC

## 2014-02-09 MED ORDER — PREDNISONE 10 MG PO TABS: ORAL_TABLET | ORAL | Status: DC

## 2014-02-09 NOTE — Progress Notes (Signed)
  [facility] [address], [place] [address] Phone: [phone] Subjective:    I'm seeing this patient by the request  of:  Cathlean Cower, MD   CC: left knee epain  HQP:[id number] Elaine Turner is a 68 y.o. female coming in with complaint of left knee pain for multiple months duration. Patient is no room for any true injury. Patient states more of insidious onset that seems to give her more of a medial knee pain. Denies any significant radiation down the legs any numbness. Patient states the pain has been so bad that it she stopped using stairs. Patient does have a past medical history significant for arthritis. Patient does not have any imaging of the knee previously. Patient denies any nighttime awakening but states that the severity is 7/10 it is more of a dull aching sensation he can make her knee feels unstable from time to time.    Past medical history, social, surgical and family history all reviewed in electronic medical record.   Review of Systems: No headache, visual changes, nausea, vomiting, diarrhea, constipation, dizziness, abdominal pain, skin rash, fevers, chills, night sweats, weight loss, swollen lymph nodes, body aches, joint swelling, muscle aches, chest pain, shortness of breath, mood changes.   Objective Blood pressure 142/60, pulse 81, temperature 97.3 F (36.3 C), temperature source Oral, resp. rate 16, weight 179 lb 12.8 oz (81.557 kg), SpO2 93.00%.  General: No apparent distress alert and oriented x3 mood and affect normal, dressed appropriately.  HEENT: Pupils equal, extraocular movements intact  Respiratory: Patient's speak in full sentences and does not appear short of breath  Cardiovascular: No lower extremity edema, non tender, no erythema  Skin: Warm dry intact with no signs of infection or rash on extremities or on axial skeleton.  Abdomen: Soft nontender  Neuro: Cranial nerves II through XII are intact, neurovascularly  intact in all extremities with 2+ DTRs and 2+ pulses.  Lymph: No lymphadenopathy of posterior or anterior cervical chain or axillae bilaterally.  Gait mild antalgic gait.  MSK:  Non tender with full range of motion and good stability and symmetric strength and tone of shoulders, elbows, wrist, hip and ankles bilaterally.  Knee: Left Inspection shows patient does have osteoarthritic changes Palpation does have severe tenderness of the medial joint line ROM decrease last 5 of extension as well as flexion. Ligaments with solid consistent endpoints including ACL, PCL, LCL, MCL. Negative Mcmurray's, Apley's, and Thessalonian tests. Very painful patellar compression. Patellar glide with severe crepitus. Patellar and quadriceps tendons unremarkable. Hamstring and quadriceps strength is normal.  Contralateral knee does have some mild discomfort to palpation over the joint line with some mild osteoarthritic changes as well. Full range of motion. Neurovascularly intact distally    Impression and Recommendations:     This case required medical decision making of moderate complexity.

## 2014-02-09 NOTE — Progress Notes (Signed)
Subjective:    Patient ID: Elaine Turner, female    DOB: Feb 14, 1946, 68 y.o.   MRN: [id number]  HPI  Here with acute onset mild to mod 2-3 days ST, HA, general weakness and malaise, with prod cough greenish sputum, but Pt denies chest pain, increased sob or doe, wheezing, orthopnea, PND, increased LE swelling, palpitations, dizziness or syncope, except for mild wheeze/sob since last PM, asks for inhaler refill as she is out.  Also with left knee popping, crackling, mild swellin, pain worse in the past wk, asks for referral.   Pt denies polydipsia, polyuria. Pt denies new neurological symptoms such as new headache, or facial or extremity weakness or numbness.  Sept 2013 echo with normal EF, gr 1 diast dysfxn, LVH and RAA. Has hx of CAP. Past Medical History  Diagnosis Date  . Hypertension   . PVD (peripheral vascular disease)   . Arthritis   . Heart murmur   . Urine incontinence   . Pneumonia 05/01/2013  . Chronic diarrhea   . Hyperglycemia 05/01/2013   Past Surgical History  Procedure Laterality Date  . Abdominal hysterectomy      reports that she has been smoking Cigarettes.  She has a 20 pack-year smoking history. She has never used smokeless tobacco. She reports that she drinks about 0.6 ounces of alcohol per week. She reports that she does not use illicit drugs. family history is not on file. Allergies  Allergen Reactions  . Ace Inhibitors Cough  . Chantix [Varenicline] Nausea And Vomiting   Current Outpatient Prescriptions on File Prior to Visit  Medication Sig Dispense Refill  . albuterol (PROVENTIL HFA;VENTOLIN HFA) 108 (90 BASE) MCG/ACT inhaler Inhale 1-2 puffs into the lungs every 4 (four) hours as needed for wheezing or shortness of breath.  6.7 g  0  . hydrocortisone (ANUSOL-HC) 25 MG suppository Place 1 suppository (25 mg total) rectally 2 (two) times daily.  12 suppository  0  . losartan-hydrochlorothiazide (HYZAAR) 50-12.5 MG per tablet Take 1 tablet by mouth daily.        Marland Kitchen losartan-hydrochlorothiazide (HYZAAR) 50-12.5 MG per tablet TAKE 1 TABLET BY MOUTH DAILY.  90 tablet  3  . NIFEdipine (PROCARDIA XL/ADALAT-CC) 90 MG 24 hr tablet TAKE 1 TABLET (90 MG TOTAL) BY MOUTH DAILY.  90 tablet  3  . nystatin (MYCOSTATIN) 100000 UNIT/ML suspension Take 5 cc, swish and spit 4 times daily for 7 days.  140 mL  0  . potassium chloride (K-DUR,KLOR-CON) 10 MEQ tablet       . potassium chloride (KLOR-CON 10) 10 MEQ tablet Take 2 tablets (20 mEq total) by mouth daily.  180 tablet  3  . temazepam (RESTORIL) 15 MG capsule 1-2 tabs by mouth at bedtime as needed for sleep  60 capsule  2   No current facility-administered medications on file prior to visit.   Review of Systems  Constitutional: Negative for unexpected weight change, or unusual diaphoresis  HENT: Negative for tinnitus.   Eyes: Negative for photophobia and visual disturbance.  Respiratory: Negative for choking and stridor.   Gastrointestinal: Negative for vomiting and blood in stool.  Genitourinary: Negative for hematuria and decreased urine volume.  Musculoskeletal: Negative for acute joint swelling Skin: Negative for color change and wound.  Neurological: Negative for tremors and numbness other than noted  Psychiatric/Behavioral: Negative for decreased concentration or  hyperactivity.       Objective:   Physical Exam BP 142/60  Pulse 81  Temp(Src) 97.3 F (36.3 C) (  Oral)  Ht 5\' 2"  (1.575 m)  Wt 179 lb 8 oz (81.421 kg)  BMI 32.82 kg/m2  SpO2 93% VS noted, mild ill Constitutional: Pt appears well-developed and well-nourished.  HENT: Head: NCAT.  Right Ear: External ear normal.  Left Ear: External ear normal.  Eyes: Conjunctivae and EOM are normal. Pupils are equal, round, and reactive to light.  Neck: Normal range of motion. Neck supple.  Cardiovascular: Normal rate and regular rhythm.   Pulmonary/Chest: Effort normal and breath sounds ,mild decreased, few wheeze bilat but also frank bilat course  rales c/w prior exam and abnormal cxr sept 2013.  Left knee with crepitus, somewhat decr ROM, NT, neg lachmans Neurological: Pt is alert. Not confused  Skin: Skin is warm. No erythema. No LE edema Psychiatric: Pt behavior is normal. Thought content normal.     Assessment & Plan:

## 2014-02-09 NOTE — Assessment & Plan Note (Signed)
prob DJD flare, ok for sport med referral now

## 2014-02-09 NOTE — Telephone Encounter (Signed)
The patient is need of a call to explain her medications.

## 2014-02-09 NOTE — Telephone Encounter (Signed)
Called the patient again, but no answer and no voice mail to leave a message.

## 2014-02-09 NOTE — Telephone Encounter (Signed)
Called the patient line was busy will try again.

## 2014-02-09 NOTE — Assessment & Plan Note (Signed)
Patient does have severe arthritis of the knees bilaterally likely left greater than right. We will get x-rays for further evaluation. We discussed different treatment options, prognosis and rehabilitation exercises today in great detail. Patient has decided to do more of a conservative approach and hold on any intervention such as a intra-articular injection. Patient was given home exercise, was given a brace to wear a regular basis. We discussed icing protocol in over-the-counter medicines they can be beneficial. Patient will try these interventions and come back in 3-4 weeks. At that time it continuing to have pain we will do an injection. He should also likely be a candidate for viscous supplementation.

## 2014-02-09 NOTE — Patient Instructions (Addendum)
Very nice to meet you Get xrays downstairs Vitamin D 2000 IU daily.  Glucosamine 1500mg  daily.  Meloxicam if you need it.  ICe 20 minutes 2 times a day Wear brace with activity Exercises 3 times a week.  Come back in 3-4 weeks to check in.

## 2014-02-09 NOTE — Assessment & Plan Note (Signed)
stable overall by history and exam, recent data reviewed with pt, and pt to continue medical treatment as before,  to f/u any worsening symptoms or concerns  

## 2014-02-09 NOTE — Patient Instructions (Addendum)
Please take all new medication as prescribed - the antibiotic, prednisone (both sent to your pharmacy), and the cough medicine (given in hardcopy prescription today)  Please continue all other medications as before, and refills have been done if requested - your inhaler was sent to the pharmacy  You have an appt with Dr Tamala Julian for your left knee after your visit today with me  Please go to the XRAY Department in the Basement (go straight as you get off the elevator) for the x-ray testing  You will be contacted by phone if any changes need to be made immediately.  Otherwise, you will receive a letter about your results with an explanation, but please check with MyChart first.  Please return in May 2015, or sooner if needed

## 2014-02-09 NOTE — Progress Notes (Signed)
Pre-visit discussion using our clinic review tool. No additional management support is needed unless otherwise documented below in the visit note.  

## 2014-02-09 NOTE — Telephone Encounter (Signed)
Called back no answer and no vm to leave a message.

## 2014-02-09 NOTE — Assessment & Plan Note (Signed)
Mild to mod, for antibx course,  to f/u any worsening symptoms or concerns 

## 2014-02-09 NOTE — Assessment & Plan Note (Addendum)
With some evidence for progressive bilat LL fibrosis on last cxr 2013, for f/u cxr today, pt asympt prior to acute symtpoms for cough, sob though uses inhaler infrequently; no prior hx of pulm htn, no LE edema

## 2014-02-09 NOTE — Assessment & Plan Note (Signed)
/  Mild to mod, for predpac asd,  to f/u any worsening symptoms or concerns 

## 2014-02-14 NOTE — Telephone Encounter (Signed)
Patient called no answer and no voice mail to leave a message.

## 2014-02-23 ENCOUNTER — Telehealth: Payer: Self-pay | Admitting: *Deleted

## 2014-02-23 NOTE — Telephone Encounter (Signed)
I really dont have much to offer, except to say take otc mucinex bid prn  We should avoid decongestant such as sudafed b/c of her hx of HTN

## 2014-02-23 NOTE — Telephone Encounter (Signed)
Finally reached patient and relayed MD's recommendations.

## 2014-02-23 NOTE — Telephone Encounter (Signed)
Attempted to call and notify patient again, no answer, no answering machine.

## 2014-02-23 NOTE — Telephone Encounter (Signed)
Attempted to phone patient & relay MD recommendations, no answer.

## 2014-02-23 NOTE — Telephone Encounter (Signed)
Patient phoned triage line stating she was still having problems with congestion and requesting PCP to phone something in.  Last OV with PCP 02/09/14.  CB# [phone]

## 2014-03-08 ENCOUNTER — Other Ambulatory Visit: Payer: Self-pay | Admitting: Family Medicine

## 2014-03-12 ENCOUNTER — Telehealth: Payer: Self-pay | Admitting: *Deleted

## 2014-03-12 MED ORDER — MELOXICAM 15 MG PO TABS: 15.0000 mg | ORAL_TABLET | Freq: Every day | ORAL | Status: DC

## 2014-03-12 NOTE — Telephone Encounter (Signed)
Pt called requesting a refill on Meloxicam.  Please advise

## 2014-03-12 NOTE — Telephone Encounter (Signed)
Pt made aware

## 2014-03-12 NOTE — Telephone Encounter (Signed)
Please call and tell them I refilled med.

## 2014-04-13 ENCOUNTER — Encounter: Payer: Self-pay | Admitting: Internal Medicine

## 2014-04-13 ENCOUNTER — Telehealth: Payer: Self-pay | Admitting: Internal Medicine

## 2014-04-13 ENCOUNTER — Other Ambulatory Visit (INDEPENDENT_AMBULATORY_CARE_PROVIDER_SITE_OTHER): Payer: Medicare Other

## 2014-04-13 ENCOUNTER — Ambulatory Visit (INDEPENDENT_AMBULATORY_CARE_PROVIDER_SITE_OTHER): Payer: Medicare Other | Admitting: Internal Medicine

## 2014-04-13 VITALS — BP 146/64 | HR 84 | Wt 177.5 lb

## 2014-04-13 DIAGNOSIS — J069 Acute upper respiratory infection, unspecified: Secondary | ICD-10-CM | POA: Insufficient documentation

## 2014-04-13 DIAGNOSIS — R7302 Impaired glucose tolerance (oral): Secondary | ICD-10-CM | POA: Insufficient documentation

## 2014-04-13 DIAGNOSIS — E785 Hyperlipidemia, unspecified: Secondary | ICD-10-CM

## 2014-04-13 DIAGNOSIS — Z0000 Encounter for general adult medical examination without abnormal findings: Secondary | ICD-10-CM

## 2014-04-13 DIAGNOSIS — I10 Essential (primary) hypertension: Secondary | ICD-10-CM

## 2014-04-13 DIAGNOSIS — R7309 Other abnormal glucose: Secondary | ICD-10-CM

## 2014-04-13 DIAGNOSIS — Z23 Encounter for immunization: Secondary | ICD-10-CM

## 2014-04-13 LAB — URINALYSIS, ROUTINE W REFLEX MICROSCOPIC
Bilirubin Urine: NEGATIVE
Hgb urine dipstick: NEGATIVE
Ketones, ur: NEGATIVE
Leukocytes, UA: NEGATIVE
Nitrite: NEGATIVE
RBC / HPF: NONE SEEN (ref 0–?)
Specific Gravity, Urine: 1.015 (ref 1.000–1.030)
Total Protein, Urine: NEGATIVE
Urine Glucose: NEGATIVE
Urobilinogen, UA: 0.2 (ref 0.0–1.0)
pH: 6 (ref 5.0–8.0)

## 2014-04-13 LAB — TSH: TSH: 0.77 u[IU]/mL (ref 0.35–5.50)

## 2014-04-13 LAB — CBC WITH DIFFERENTIAL/PLATELET
Basophils Absolute: 0.1 10*3/uL (ref 0.0–0.1)
Basophils Relative: 0.4 % (ref 0.0–3.0)
Eosinophils Absolute: 0.5 10*3/uL (ref 0.0–0.7)
Eosinophils Relative: 3.6 % (ref 0.0–5.0)
HCT: 42 % (ref 36.0–46.0)
Hemoglobin: 13.3 g/dL (ref 12.0–15.0)
Lymphocytes Relative: 11 % — ABNORMAL LOW (ref 12.0–46.0)
Lymphs Abs: 1.5 10*3/uL (ref 0.7–4.0)
MCHC: 31.8 g/dL (ref 30.0–36.0)
MCV: 74.6 fl — ABNORMAL LOW (ref 78.0–100.0)
Monocytes Absolute: 0.7 10*3/uL (ref 0.1–1.0)
Monocytes Relative: 5.3 % (ref 3.0–12.0)
Neutro Abs: 10.6 10*3/uL — ABNORMAL HIGH (ref 1.4–7.7)
Neutrophils Relative %: 79.7 % — ABNORMAL HIGH (ref 43.0–77.0)
Platelets: 490 10*3/uL — ABNORMAL HIGH (ref 150.0–400.0)
RBC: 5.63 Mil/uL — ABNORMAL HIGH (ref 3.87–5.11)
RDW: 17.5 % — ABNORMAL HIGH (ref 11.5–14.6)
WBC: 13.3 10*3/uL — ABNORMAL HIGH (ref 4.5–10.5)

## 2014-04-13 LAB — BASIC METABOLIC PANEL
BUN: 30 mg/dL — ABNORMAL HIGH (ref 6–23)
CO2: 26 mEq/L (ref 19–32)
Calcium: 9.4 mg/dL (ref 8.4–10.5)
Chloride: 101 mEq/L (ref 96–112)
Creatinine, Ser: 1.4 mg/dL — ABNORMAL HIGH (ref 0.4–1.2)
GFR: 47.4 mL/min — ABNORMAL LOW (ref >60.00)
Glucose, Bld: 97 mg/dL (ref 70–99)
Potassium: 4.3 mEq/L (ref 3.5–5.1)
Sodium: 138 mEq/L (ref 135–145)

## 2014-04-13 LAB — LIPID PANEL
Cholesterol: 166 mg/dL (ref 0–200)
HDL: 41.4 mg/dL (ref >39.00)
LDL Cholesterol: 97 mg/dL (ref 0–99)
Total CHOL/HDL Ratio: 4
Triglycerides: 137 mg/dL (ref 0.0–149.0)
VLDL: 27.4 mg/dL (ref 0.0–40.0)

## 2014-04-13 LAB — HEPATIC FUNCTION PANEL
ALT: 16 U/L (ref 0–35)
AST: 17 U/L (ref 0–37)
Albumin: 3.9 g/dL (ref 3.5–5.2)
Alkaline Phosphatase: 74 U/L (ref 39–117)
Bilirubin, Direct: 0.1 mg/dL (ref 0.0–0.3)
Total Bilirubin: 0.5 mg/dL (ref 0.3–1.2)
Total Protein: 8.1 g/dL (ref 6.0–8.3)

## 2014-04-13 LAB — HEMOGLOBIN A1C: Hgb A1c MFr Bld: 5.9 % (ref 4.6–6.5)

## 2014-04-13 MED ORDER — BENZONATATE 100 MG PO CAPS: ORAL_CAPSULE | ORAL | Status: DC

## 2014-04-13 MED ORDER — AZITHROMYCIN 250 MG PO TABS: ORAL_TABLET | ORAL | Status: DC

## 2014-04-13 MED ORDER — HYDROCODONE-HOMATROPINE 5-1.5 MG/5ML PO SYRP: 5.0000 mL | ORAL_SOLUTION | Freq: Four times a day (QID) | ORAL | Status: DC | PRN

## 2014-04-13 NOTE — Assessment & Plan Note (Signed)
Mild to mod, for antibx course,  to f/u any worsening symptoms or concerns 

## 2014-04-13 NOTE — Assessment & Plan Note (Signed)

## 2014-04-13 NOTE — Progress Notes (Signed)
Subjective:    Patient ID: Elaine Turner, female    DOB: 06-May-1946, 68 y.o.   MRN: [id number]  HPI  Here for wellness and f/u;  Overall doing ok;  Pt denies CP, worsening SOB, DOE, wheezing, orthopnea, PND, worsening LE edema, palpitations, dizziness or syncope.  Pt denies neurological change such as new headache, facial or extremity weakness.  Pt denies polydipsia, polyuria, or low sugar symptoms. Pt states overall good compliance with treatment and medications, good tolerability, and has been trying to follow lower cholesterol diet.  Pt denies worsening depressive symptoms, suicidal ideation or panic. No fever, night sweats, wt loss, loss of appetite, or other constitutional symptoms.  Pt states good ability with ADL's, has low fall risk, home safety reviewed and adequate, no other significant changes in hearing or vision, and only occasionally active with exercise.  Has incidentally URi symtpoms -  Here with 2-3 days acute onset fever, facial pain, pressure, headache, general weakness and malaise, and greenish d/c, with mild ST and cough. Past Medical History  Diagnosis Date  . Hypertension   . PVD (peripheral vascular disease)   . Arthritis   . Heart murmur   . Urine incontinence   . Pneumonia 05/01/2013  . Chronic diarrhea   . Hyperglycemia 05/01/2013   Past Surgical History  Procedure Laterality Date  . Abdominal hysterectomy      reports that she has been smoking Cigarettes.  She has a 20 pack-year smoking history. She has never used smokeless tobacco. She reports that she drinks about .6 ounces of alcohol per week. She reports that she does not use illicit drugs. family history is not on file. Allergies  Allergen Reactions  . Ace Inhibitors Cough  . Chantix [Varenicline] Nausea And Vomiting   Current Outpatient Prescriptions on File Prior to Visit  Medication Sig Dispense Refill  . albuterol (PROVENTIL HFA;VENTOLIN HFA) 108 (90 BASE) MCG/ACT inhaler Inhale 1-2 puffs into the  lungs every 4 (four) hours as needed for wheezing or shortness of breath.  6.7 g  2  . hydrocortisone (ANUSOL-HC) 25 MG suppository Place 1 suppository (25 mg total) rectally 2 (two) times daily.  12 suppository  0  . losartan-hydrochlorothiazide (HYZAAR) 50-12.5 MG per tablet Take 1 tablet by mouth daily.      . meloxicam (MOBIC) 15 MG tablet Take 1 tablet (15 mg total) by mouth daily.  90 tablet  0  . NIFEdipine (PROCARDIA XL/ADALAT-CC) 90 MG 24 hr tablet TAKE 1 TABLET (90 MG TOTAL) BY MOUTH DAILY.  90 tablet  3  . nystatin (MYCOSTATIN) 100000 UNIT/ML suspension Take 5 cc, swish and spit 4 times daily for 7 days.  140 mL  0  . potassium chloride (K-DUR,KLOR-CON) 10 MEQ tablet       . potassium chloride (KLOR-CON 10) 10 MEQ tablet Take 2 tablets (20 mEq total) by mouth daily.  180 tablet  3  . temazepam (RESTORIL) 15 MG capsule 1-2 tabs by mouth at bedtime as needed for sleep  60 capsule  2   No current facility-administered medications on file prior to visit.    Review of Systems Constitutional: Negative for diaphoresis, activity change, appetite change or unexpected weight change.  HENT: Negative for hearing loss, ear pain, facial swelling, mouth sores and neck stiffness.   Eyes: Negative for pain, redness and visual disturbance.  Respiratory: Negative for shortness of breath and wheezing.   Cardiovascular: Negative for chest pain and palpitations.  Gastrointestinal: Negative for diarrhea, blood in stool, abdominal  distention or other pain Genitourinary: Negative for hematuria, flank pain or change in urine volume.  Musculoskeletal: Negative for myalgias and joint swelling.  Skin: Negative for color change and wound.  Neurological: Negative for syncope and numbness. other than noted Hematological: Negative for adenopathy.  Psychiatric/Behavioral: Negative for hallucinations, self-injury, decreased concentration and agitation.      Objective:   Physical Exam BP 146/64  Pulse 84  Wt  177 lb 8 oz (80.513 kg) VS noted,  Constitutional: Pt is oriented to person, place, and time. Appears well-developed and well-nourished.  Head: Normocephalic and atraumatic.  Right Ear: External ear normal.  Left Ear: External ear normal.  Nose: Nose normal.  Mouth/Throat: Oropharynx is clear and moist.  Bilat tm's with mild erythema.  Max sinus areas mild tender.  Pharynx with mild erythema, no exudate Eyes: Conjunctivae and EOM are normal. Pupils are equal, round, and reactive to light.  Neck: Normal range of motion. Neck supple. No JVD present. No tracheal deviation present.  Cardiovascular: Normal rate, regular rhythm, normal heart sounds and intact distal pulses.   Pulmonary/Chest: Effort normal and breath sounds normal.  Abdominal: Soft. Bowel sounds are normal. There is no tenderness. No HSM  Musculoskeletal: Normal range of motion. Exhibits no edema.  Lymphadenopathy:  Has no cervical adenopathy.  Neurological: Pt is alert and oriented to person, place, and time. Pt has normal reflexes. No cranial nerve deficit.  Skin: Skin is warm and dry. No rash noted.  Psychiatric:  Has  normal mood and affect. Behavior is normal.     Assessment & Plan:

## 2014-04-13 NOTE — Progress Notes (Signed)
Pre visit review using our clinic review tool, if applicable. No additional management support is needed unless otherwise documented below in the visit note. 

## 2014-04-13 NOTE — Telephone Encounter (Signed)
Pt went to pick up the cough syrup.  It would cost $50.  She wants something cheaper. Cell phone is [phone]

## 2014-04-13 NOTE — Patient Instructions (Addendum)
You had the new prevnar pneumonia shot today  Please take all new medication as prescribed - the antibiotic, and cough medicine  You can also take OTC zyrtec for allergies  Please continue all other medications as before, and refills have been done if requested. Please have the pharmacy call with any other refills you may need.  Please continue your efforts at being more active, low cholesterol diet, and weight control. You are otherwise up to date with prevention measures today.  Please keep your appointments with your specialists as you may have planned  Please go to the LAB in the Basement (turn left off the elevator) for the tests to be done today  You will be contacted by phone if any changes need to be made immediately.  Otherwise, you will receive a letter about your results with an explanation, but please check with MyChart first.  Please return in 6 months, or sooner if needed

## 2014-04-13 NOTE — Assessment & Plan Note (Signed)
Also for a1c 

## 2014-04-13 NOTE — Telephone Encounter (Signed)
Patient notified

## 2014-04-13 NOTE — Telephone Encounter (Signed)
For tessalon perle asd - done erx

## 2014-06-17 ENCOUNTER — Other Ambulatory Visit: Payer: Self-pay | Admitting: Family Medicine

## 2014-10-17 ENCOUNTER — Ambulatory Visit: Payer: Medicare Other | Admitting: Internal Medicine

## 2014-10-17 DIAGNOSIS — Z0289 Encounter for other administrative examinations: Secondary | ICD-10-CM

## 2014-11-05 ENCOUNTER — Other Ambulatory Visit: Payer: Self-pay | Admitting: Internal Medicine

## 2015-05-04 ENCOUNTER — Other Ambulatory Visit: Payer: Self-pay | Admitting: Internal Medicine

## 2015-05-15 ENCOUNTER — Other Ambulatory Visit: Payer: Self-pay | Admitting: Internal Medicine

## 2015-05-16 ENCOUNTER — Encounter: Payer: Self-pay | Admitting: Internal Medicine

## 2015-05-16 ENCOUNTER — Telehealth: Payer: Self-pay | Admitting: Internal Medicine

## 2015-05-16 ENCOUNTER — Other Ambulatory Visit (INDEPENDENT_AMBULATORY_CARE_PROVIDER_SITE_OTHER): Payer: Medicare Other

## 2015-05-16 ENCOUNTER — Ambulatory Visit (INDEPENDENT_AMBULATORY_CARE_PROVIDER_SITE_OTHER): Payer: Medicare Other | Admitting: Internal Medicine

## 2015-05-16 VITALS — BP 138/62 | HR 82 | Temp 98.3°F | Resp 16 | Wt 175.0 lb

## 2015-05-16 DIAGNOSIS — Z720 Tobacco use: Secondary | ICD-10-CM

## 2015-05-16 DIAGNOSIS — K5900 Constipation, unspecified: Secondary | ICD-10-CM

## 2015-05-16 DIAGNOSIS — F17200 Nicotine dependence, unspecified, uncomplicated: Secondary | ICD-10-CM

## 2015-05-16 DIAGNOSIS — J209 Acute bronchitis, unspecified: Secondary | ICD-10-CM

## 2015-05-16 DIAGNOSIS — K648 Other hemorrhoids: Secondary | ICD-10-CM | POA: Diagnosis not present

## 2015-05-16 DIAGNOSIS — K644 Residual hemorrhoidal skin tags: Secondary | ICD-10-CM

## 2015-05-16 LAB — CBC WITH DIFFERENTIAL/PLATELET
Basophils Absolute: 0 10*3/uL (ref 0.0–0.1)
Basophils Relative: 0.3 % (ref 0.0–3.0)
Eosinophils Absolute: 0.4 10*3/uL (ref 0.0–0.7)
Eosinophils Relative: 3.1 % (ref 0.0–5.0)
HCT: 38.1 % (ref 36.0–46.0)
Hemoglobin: 12.3 g/dL (ref 12.0–15.0)
Lymphocytes Relative: 10.9 % — ABNORMAL LOW (ref 12.0–46.0)
Lymphs Abs: 1.4 10*3/uL (ref 0.7–4.0)
MCHC: 32.3 g/dL (ref 30.0–36.0)
MCV: 73.5 fl — ABNORMAL LOW (ref 78.0–100.0)
Monocytes Absolute: 0.8 10*3/uL (ref 0.1–1.0)
Monocytes Relative: 5.8 % (ref 3.0–12.0)
Neutro Abs: 10.4 10*3/uL — ABNORMAL HIGH (ref 1.4–7.7)
Neutrophils Relative %: 79.9 % — ABNORMAL HIGH (ref 43.0–77.0)
Platelets: 435 10*3/uL — ABNORMAL HIGH (ref 150.0–400.0)
RBC: 5.19 Mil/uL — ABNORMAL HIGH (ref 3.87–5.11)
RDW: 16.9 % — ABNORMAL HIGH (ref 11.5–15.5)
WBC: 13 10*3/uL — ABNORMAL HIGH (ref 4.0–10.5)

## 2015-05-16 LAB — TSH: TSH: 0.52 u[IU]/mL (ref 0.35–4.50)

## 2015-05-16 MED ORDER — AZITHROMYCIN 250 MG PO TABS: ORAL_TABLET | ORAL | Status: DC

## 2015-05-16 MED ORDER — HYDROCORTISONE ACE-PRAMOXINE 1-1 % RE FOAM: 1.0000 | Freq: Two times a day (BID) | RECTAL | Status: DC

## 2015-05-16 MED ORDER — HYDROCODONE-HOMATROPINE 5-1.5 MG/5ML PO SYRP: 5.0000 mL | ORAL_SOLUTION | Freq: Four times a day (QID) | ORAL | Status: DC | PRN

## 2015-05-16 NOTE — Progress Notes (Signed)
   Subjective:    Patient ID: Elaine Turner, female    DOB: 1946-09-22, 69 y.o.   MRN: [id number]  HPI She has several concerns.  Since 05/14/15 she's had a cough which disturbs sleep. It is productive of white sputum. Over-the-counter medications including Mucinex have been of moderate benefit. There are no associated upper respiratory tract infection symptoms. She has smoked one pack a day since age of 64. She has a history of pneumonia. The last x-ray on record was February 2015 which revealed no active disease. She also has a history of asthma with bronchitis.  She has hemorrhoids in the context of chronic constipation. She must use Dulcolax to have a bowel movement twice a week. She has bright red blood and associated flatus. She describes some anorexia and decreased oral intake. Yesterday she had one taco the entire day.  She had a colonoscopy 05/17/13 which revealed severe diverticulosis.   Review of Systems  Frontal headache, facial pain , nasal purulence, dental pain, sore throat , otic pain or otic discharge denied. No fever , chills or sweats.Extrinsic symptoms of itchy, watery eyes, sneezing, or angioedema are denied. There is no significant cough, sputum production, wheezing,or  paroxysmal nocturnal dyspnea. Unexplained weight loss, abdominal pain, significant dyspepsia, dysphagia, melena, or persistently small caliber stools are denied.     Objective:   Physical Exam  Pertinent or positive findings include:  She has wax in the left greater than the right ear. There is erythema of the nasal mucosa.  She has extremely poor dentition with scattered caries.  Breath sounds are decreased.  She has a grade 1 systolic murmur at the right base.  Left carotid bruit is suggested.  Abdomen is protuberant.  She has giant external hemorrhoids. There is an area of severe erythema without active bleeding noted. Pedal pulses are decreased.  General appearance :adequately nourished; in no  distress. Eyes: No conjunctival inflammation or scleral icterus is present. Oral exam:  Lips and gums are healthy appearing.There is no oropharyngeal erythema or exudate noted.  Heart:  Normal rate and regular rhythm. S1 and S2 normal without gallop, click, rub or other extra sounds   Lungs:Chest clear to auscultation; no wheezes, rhonchi,rales ,or rubs present.No increased work of breathing.  Abdomen: bowel sounds normal, soft and non-tender without masses, organomegaly or hernias noted.  No guarding or rebound.  Vascular : all pulses equal ; no bruits present. Skin:Warm & dry.  Intact without suspicious lesions or rashes ; no tenting  Lymphatic: No lymphadenopathy is noted about the head, neck, axilla.  Neuro: Strength, tone  normal.       Assessment & Plan:  #1 acute bronchitis without bronchospasm  #2 giant hemorrhoids which will require surgical intervention  #3 constipation; TSH should be evaluated to rule out hypothyroidism as a component.  #4 smoker; risks discussed See orders

## 2015-05-16 NOTE — Progress Notes (Signed)
Pre visit review using our clinic review tool, if applicable. No additional management support is needed unless otherwise documented below in the visit note. 

## 2015-05-16 NOTE — Patient Instructions (Signed)
  Your next office appointment will be determined based upon review of your pending labs. Those instructions will be transmitted to you by mail for your records. Critical results will be called.  Followup as needed for any active or acute issue. Please report any significant change in your symptoms.  Cleanse rectal area with lather of mild shampoo in shower  as discussed. After bowel movement,use tissue to cleanse the bulk of stool & finish up with TUCKS  or Baby Wipes.  Sitz baths followed by the  Medication 2 to 3 times a day to shrink the hemorrhoids. Stay well hydrated and avoid popcorn and some other materials which might aggravate hemorrhoids.  The Surgery referral will be scheduled and you'll be notified of the time.Please call the Referral Co-Ordinator @ [phone] if you have not been notified of appointment time within 7-10 days.Please think about quitting smoking. Review the risks we discussed. Please call 1-800-QUIT-NOW [phone]) for free smoking cessation counseling.

## 2015-05-16 NOTE — Telephone Encounter (Signed)
Called and left message explaining dr hoppers office visit notes again, advised pt to call back if this did not answer her questions

## 2015-05-16 NOTE — Telephone Encounter (Signed)
Patient did not understand instructions Dr. Linna Darner gave her today for hemorrhoids.  She is requesting a call back in regards.

## 2015-05-17 ENCOUNTER — Telehealth: Payer: Self-pay | Admitting: Internal Medicine

## 2015-05-17 ENCOUNTER — Other Ambulatory Visit: Payer: Self-pay

## 2015-05-17 DIAGNOSIS — K644 Residual hemorrhoidal skin tags: Secondary | ICD-10-CM

## 2015-05-17 MED ORDER — HYDROCORTISONE 2.5 % RE CREA: 1.0000 "application " | TOPICAL_CREAM | Freq: Two times a day (BID) | RECTAL | Status: DC

## 2015-05-17 MED ORDER — HYDROCORTISONE ACE-PRAMOXINE 1-1 % RE FOAM: 1.0000 | Freq: Two times a day (BID) | RECTAL | Status: DC

## 2015-05-17 MED ORDER — PHENAZOPYRIDINE HCL 95 MG PO TABS: 95.0000 mg | ORAL_TABLET | Freq: Three times a day (TID) | ORAL | Status: DC | PRN

## 2015-05-17 NOTE — Telephone Encounter (Signed)
Patient did not receive the prescription for hydrocortisone-pramoxine (PROCTOFOAM [facility]) rectal foam [[id number]] and she stated she was also supposed to get a medication for her frequent urniation. Pharmacy is [facility] on [address].

## 2015-05-17 NOTE — Telephone Encounter (Signed)
Please advise, thanks.

## 2015-05-17 NOTE — Telephone Encounter (Signed)
Patient states that this med was received by [facility].  But insurance does not cover.  Patient can not afford.  She is requesting something else to be sent in.

## 2015-05-17 NOTE — Telephone Encounter (Signed)
[facility] called back asking to change to proctosol cream, dr hopper gave verbal ok, and i have also sent pyridium rx

## 2015-05-17 NOTE — Telephone Encounter (Signed)
What is covered substitute for Proctofoam HC; whatever is covered please order.She has severe hemorrhoids Generic Pyridium tid prn #15

## 2015-05-20 ENCOUNTER — Telehealth: Payer: Self-pay | Admitting: Internal Medicine

## 2015-05-20 NOTE — Telephone Encounter (Signed)
Left message advising patient of dr hoppers note---asked patient to call back if she wants urology referral sent

## 2015-05-20 NOTE — Telephone Encounter (Signed)
i tried calling patient, phone just rings and rings, no way to leave message---if patient calls back , i just need to know if she is ok with referal to urology

## 2015-05-20 NOTE — Telephone Encounter (Signed)
Patient called back. Please call her

## 2015-05-20 NOTE — Telephone Encounter (Signed)
Patient would like something prescribed for frequent urination.  States Dr. Linna Darner prescribed her something for pain for urination.  Patient states she does not need this.

## 2015-05-20 NOTE — Telephone Encounter (Signed)
The medication is very expensive; If she is willing;I recommend Urology assessment for optimal , least expensive therapy

## 2015-05-20 NOTE — Telephone Encounter (Signed)
Please advise 

## 2015-05-29 ENCOUNTER — Ambulatory Visit (INDEPENDENT_AMBULATORY_CARE_PROVIDER_SITE_OTHER): Payer: Medicare Other | Admitting: Internal Medicine

## 2015-05-29 ENCOUNTER — Ambulatory Visit (INDEPENDENT_AMBULATORY_CARE_PROVIDER_SITE_OTHER)
Admission: RE | Admit: 2015-05-29 | Discharge: 2015-05-29 | Disposition: A | Discharge status: Home / Self Care | Payer: Medicare Other | Source: Ambulatory Visit | Attending: Internal Medicine | Admitting: Internal Medicine

## 2015-05-29 ENCOUNTER — Encounter: Payer: Self-pay | Admitting: Internal Medicine

## 2015-05-29 VITALS — BP 142/74 | HR 79 | Temp 98.1°F | Resp 14 | Wt 172.0 lb

## 2015-05-29 DIAGNOSIS — F17200 Nicotine dependence, unspecified, uncomplicated: Secondary | ICD-10-CM

## 2015-05-29 DIAGNOSIS — J209 Acute bronchitis, unspecified: Secondary | ICD-10-CM | POA: Diagnosis not present

## 2015-05-29 DIAGNOSIS — Z720 Tobacco use: Secondary | ICD-10-CM

## 2015-05-29 DIAGNOSIS — R05 Cough: Secondary | ICD-10-CM | POA: Diagnosis not present

## 2015-05-29 IMAGING — CR DG CHEST 2V
2 series · 2 of 2 positions shown · non-contrast
Comparison: [DATE]

CLINICAL DATA: Cough, chills for 2 weeks, smoker

EXAM:
CHEST  2 VIEW

[view not recorded (1 of 2)]
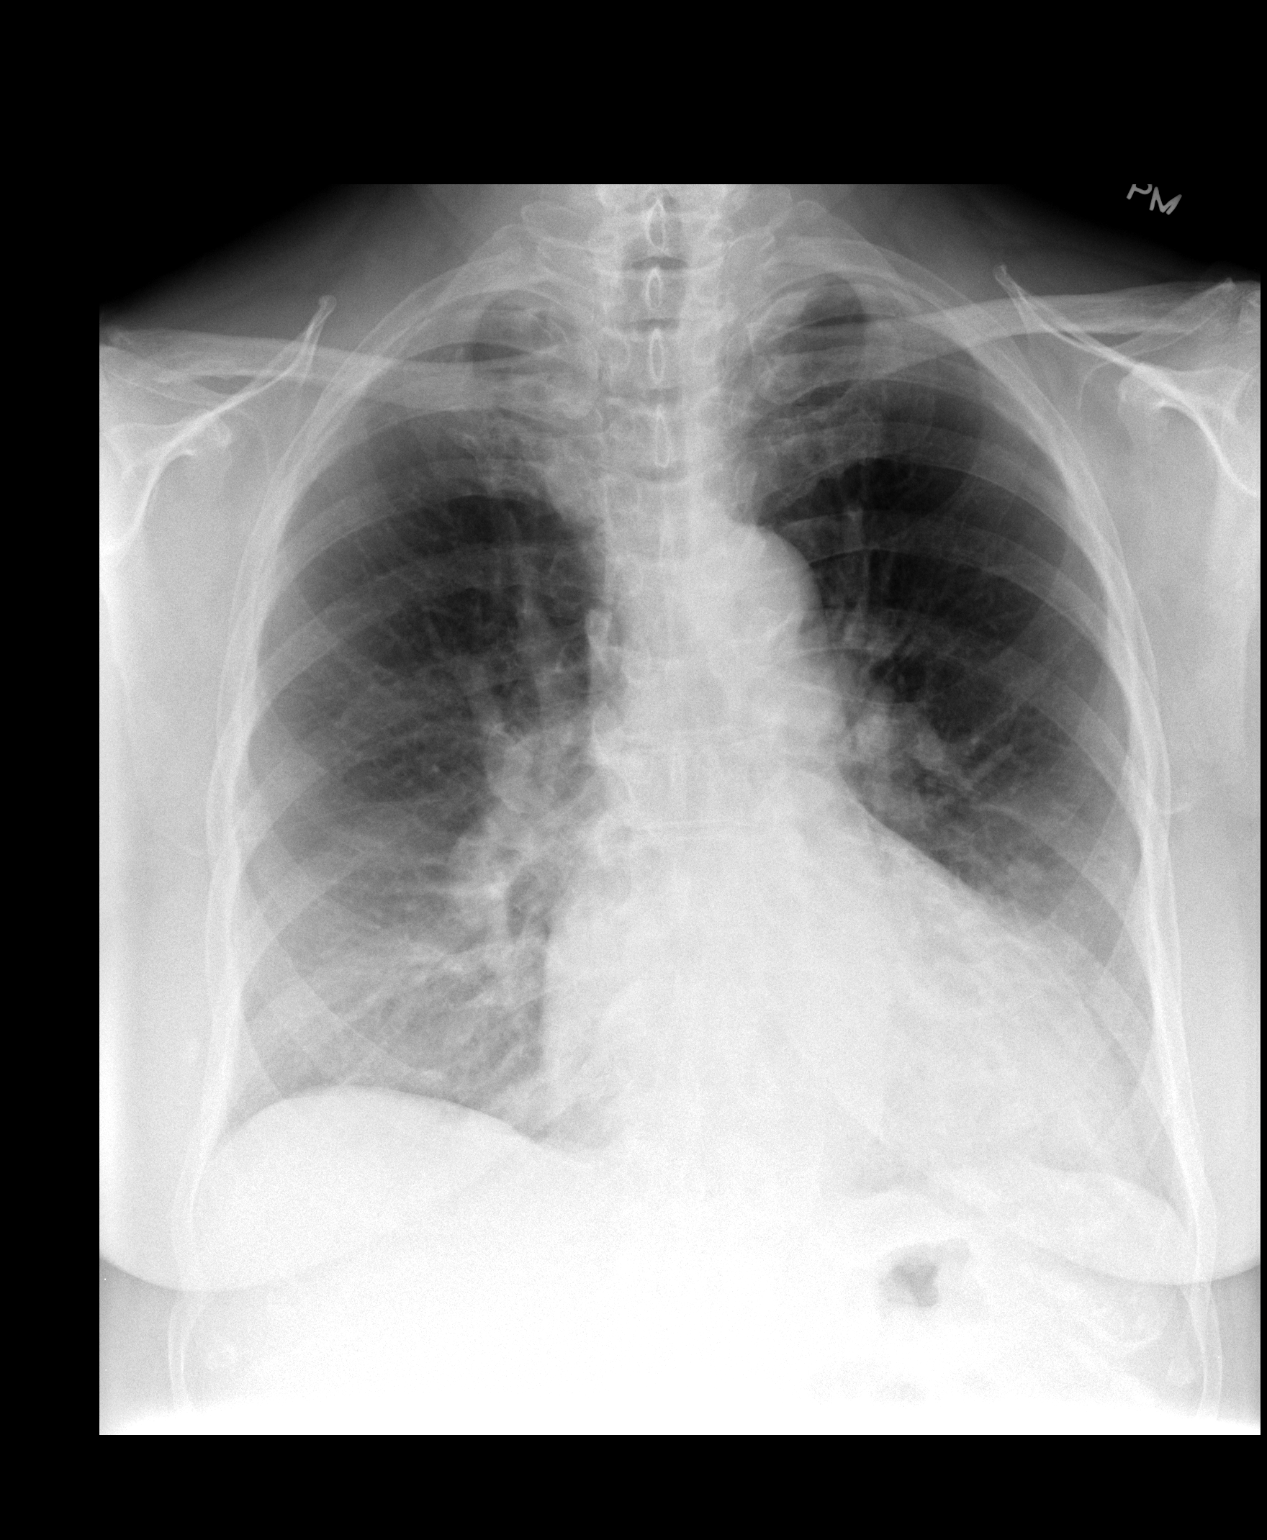

[view not recorded (2 of 2)]
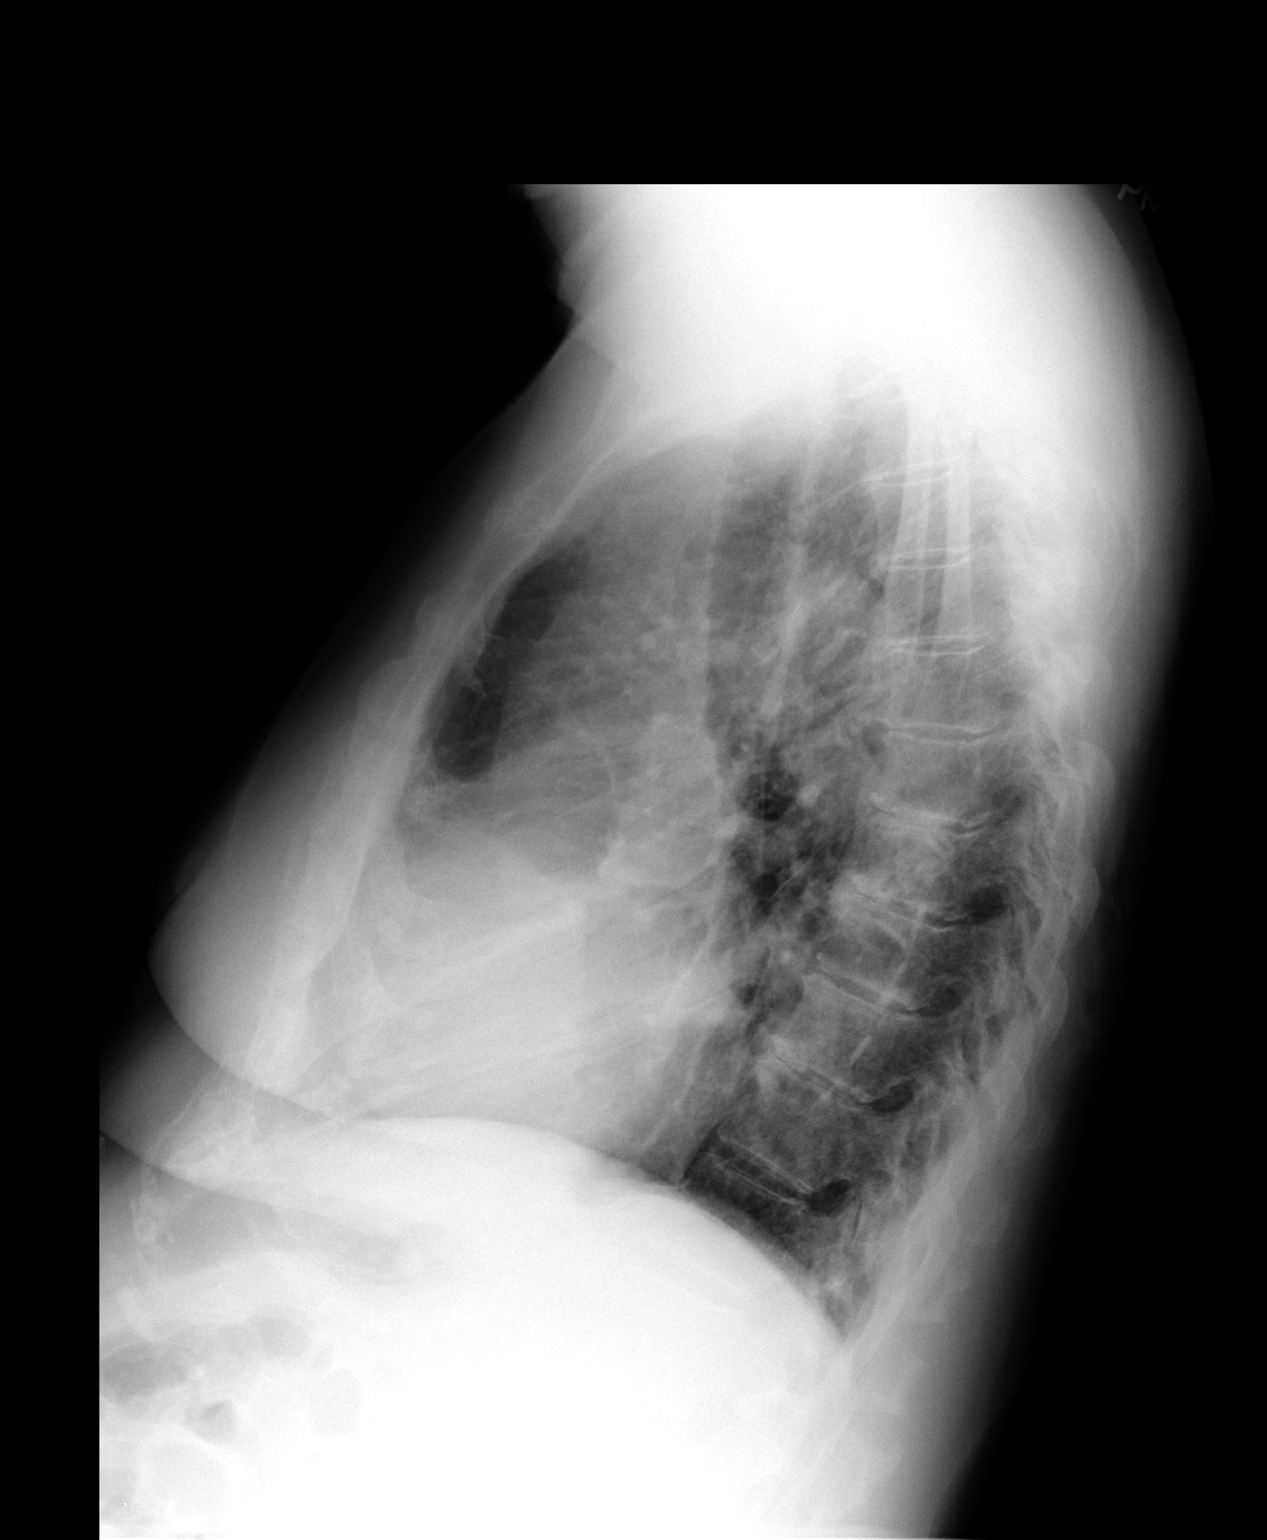

[2 of 2 positions shown; findings below may reference images not displayed]

FINDINGS: Cardiomediastinal silhouette is stable. No acute infiltrate or
pleural effusion. No pulmonary edema. Mild degenerative changes
thoracic spine again noted. There is nodular density in lingula.
This may represent nipple shadow. Repeat frontal view of the chest
with nipple markers is recommended to exclude a lung nodule.
IMPRESSION: No acute infiltrate or pleural effusion. No pulmonary edema. Mild
degenerative changes thoracic spine again noted. There is nodular
density in lingula. This may represent nipple shadow. Repeat frontal
view of the chest with nipple markers is recommended to exclude a
lung nodule.

## 2015-05-29 MED ORDER — PREDNISONE 20 MG PO TABS: 20.0000 mg | ORAL_TABLET | Freq: Two times a day (BID) | ORAL | Status: DC

## 2015-05-29 MED ORDER — AMOXICILLIN 500 MG PO CAPS: 500.0000 mg | ORAL_CAPSULE | Freq: Three times a day (TID) | ORAL | Status: DC

## 2015-05-29 NOTE — Progress Notes (Signed)
   Subjective:    Patient ID: Elaine Turner, female    DOB: [birth year], 69 y.o.   MRN: [id number]  HPI She has taken the cough syrup as well as azithromycin but continues to cough. She localizes it to the upper chest/base of the neck. She says she coughs and feels secretions moving up and down. She's had some chills and lightheadedness.  She continues to smoke 1 pack per day.  She denies extrinsic symptoms or upper respiratory tract symptoms.  Review of Systems Extrinsic symptoms of itchy, watery eyes, sneezing, or angioedema are denied. There is no wheezing or  paroxysmal nocturnal dyspnea. Frontal headache, facial pain , nasal purulence, dental pain, sore throat , otic pain or otic discharge denied. No fever or sweats.    Objective:   Physical Exam  Pertinent or positive findings include: She appears much younger than her age.  She has arcus senilis.  She exhibits a dry cough.  She has a grade 1 systolic murmur at the base.  She does have suggestion of dry rales surprisingly in the upper lobes. Her nails are markedly deformed and short.  General appearance:Adequately nourished; no acute distress or increased work of breathing is present.   Lymphatic: No  lymphadenopathy about the head, neck, or axilla . Eyes: No conjunctival inflammation or lid edema is present. There is no scleral icterus. Ears:  External ear exam shows no significant lesions or deformities.  Otoscopic examination reveals clear canals, tympanic membranes are intact bilaterally without bulging, retraction, inflammation or discharge. Nose:  External nasal examination shows no deformity or inflammation. Nasal mucosa are pink and moist without lesions or exudates No septal dislocation or deviation.No obstruction to airflow.  Oral exam: Dental hygiene is good; lips and gums are healthy appearing.There is no oropharyngeal erythema or exudate . Neck:  No deformities, thyromegaly, masses, or tenderness noted.   Heart:   Normal rate and regular rhythm. S1 and S2 normal without gallop,  click, rub or other extra sounds.  Extremities:  No cyanosis, edema, or clubbing  noted  Skin: Warm & dry w/o tenting No significant lesions or rash.       Assessment & Plan:  #1 bronchitis  #2 smoker  Plan: See orders and recommendations

## 2015-05-29 NOTE — Patient Instructions (Addendum)
  Your next office appointment will be determined based upon review of your pending xrays  Those written interpretation of the lab results and instructions will be transmitted to you by mail for your records.  Critical results will be called.   Followup as needed for any active or acute issue. Please report any significant change in your symptoms.  Please think about quitting smoking. Review the risks we discussed. Please call 1-800-QUIT-NOW [phone]) for free smoking cessation counseling.

## 2015-05-29 NOTE — Progress Notes (Signed)
Pre visit review using our clinic review tool, if applicable. No additional management support is needed unless otherwise documented below in the visit note. 

## 2015-05-31 ENCOUNTER — Other Ambulatory Visit: Payer: Self-pay | Admitting: Internal Medicine

## 2015-05-31 DIAGNOSIS — K649 Unspecified hemorrhoids: Secondary | ICD-10-CM

## 2015-05-31 DIAGNOSIS — R911 Solitary pulmonary nodule: Secondary | ICD-10-CM | POA: Insufficient documentation

## 2015-06-11 ENCOUNTER — Other Ambulatory Visit: Payer: Self-pay | Admitting: Internal Medicine

## 2015-06-12 ENCOUNTER — Ambulatory Visit: Payer: Self-pay | Admitting: General Surgery

## 2015-06-12 DIAGNOSIS — K644 Residual hemorrhoidal skin tags: Secondary | ICD-10-CM | POA: Diagnosis not present

## 2015-06-12 DIAGNOSIS — K648 Other hemorrhoids: Secondary | ICD-10-CM | POA: Diagnosis not present

## 2015-06-21 ENCOUNTER — Telehealth: Payer: Self-pay | Admitting: Internal Medicine

## 2015-06-21 NOTE — Telephone Encounter (Signed)
I will forward to DR Linna Darner, thanks

## 2015-06-21 NOTE — Telephone Encounter (Signed)
[facility] CT called and patient is cancelling her CT that Dr. Linna Darner had referred. Patient will be having surgery next week.  Patient will call them back when ready

## 2015-06-24 ENCOUNTER — Encounter (HOSPITAL_BASED_OUTPATIENT_CLINIC_OR_DEPARTMENT_OTHER): Payer: Self-pay | Admitting: *Deleted

## 2015-06-24 ENCOUNTER — Other Ambulatory Visit: Payer: Medicare Other

## 2015-06-24 NOTE — H&P (Signed)
History of Present Illness Elaine Neri E. Grandville Silos MD; 06/12/2015 9:05 AM) The patient is a 69 year old female who presents with hemorrhoids. [name] presents with a many year history of hemorrhoids. She often has bleeding after having bowel movements or just passing a lot of gas. More recently, she has started to be able to feel something on the outside of her anal area. She did not have this before. It does not seem to go in and out. Additionally, she notes a lot of bowel sounds lately and some early satiety but her weight has been stable. She takes Dulcolax daily to help with bowel movements.   Other Problems Elbert Ewings, CMA; 06/12/2015 8:44 AM) Heart murmur Hemorrhoids High blood pressure  Past Surgical History Elbert Ewings, CMA; 06/12/2015 8:44 AM) Hysterectomy (not due to cancer) - Partial  Allergies Elbert Ewings, CMA; 06/12/2015 8:45 AM) Chantix *PSYCHOTHERAPEUTIC AND NEUROLOGICAL AGENTS - MISC.* Nausea, Vomiting.  Medication History Elbert Ewings, CMA; 06/12/2015 8:46 AM) Proctosol HC (2.5% Cream, Rectal) Active. PredniSONE (20MG  Tablet, Oral) Active. NIFEdipine ER Osmotic Release (90MG  Tablet ER 24HR, Oral) Active. Losartan Potassium-HCTZ (50-12.5MG  Tablet, Oral) Active. Azithromycin (250MG  Tablet, Oral) Active. Hydrocortisone Acetate (25MG  Suppository, Rectal) Active. Medications Reconciled  Social History Elbert Ewings, [place]; 06/12/2015 8:44 AM) Alcohol use Moderate alcohol use. Caffeine use Coffee. Tobacco use Current every day smoker.  Pregnancy / Birth History Elbert Ewings, [place]; 06/12/2015 8:44 AM) Elaine Turner Age 1 Maternal age 60-20 Para 1  Review of Systems Elbert Ewings CMA; 06/12/2015 8:44 AM) General Present- Appetite Loss, Chills, Fatigue and Weight Gain. Not Present- Fever, Night Sweats and Weight Loss. Skin Not Present- Change in Wart/Mole, Dryness, Hives, Jaundice, New Lesions, Non-Healing Wounds, Rash and Ulcer. HEENT Not Present- Earache, Hearing  Loss, Hoarseness, Nose Bleed, Oral Ulcers, Ringing in the Ears, Seasonal Allergies, Sinus Pain, Sore Throat, Visual Disturbances, Wears glasses/contact lenses and Yellow Eyes. Respiratory Present- Chronic Cough. Not Present- Bloody sputum, Difficulty Breathing, Snoring and Wheezing. Breast Not Present- Breast Mass, Breast Pain, Nipple Discharge and Skin Changes. Cardiovascular Present- Rapid Heart Rate. Not Present- Chest Pain, Difficulty Breathing Lying Down, Leg Cramps, Palpitations, Shortness of Breath and Swelling of Extremities. Gastrointestinal Present- Bloating, Change in Bowel Habits, Constipation, Excessive gas, Gets full quickly at meals and Hemorrhoids. Not Present- Abdominal Pain, Bloody Stool, Chronic diarrhea, Difficulty Swallowing, Indigestion, Nausea, Rectal Pain and Vomiting. Female Genitourinary Present- Frequency and Urgency. Not Present- Nocturia, Painful Urination and Pelvic Pain. Musculoskeletal Not Present- Back Pain, Joint Pain, Joint Stiffness, Muscle Pain, Muscle Weakness and Swelling of Extremities. Neurological Present- Trouble walking. Not Present- Decreased Memory, Fainting, Headaches, Numbness, Seizures, Tingling, Tremor and Weakness. Psychiatric Present- Change in Sleep Pattern. Not Present- Anxiety, Bipolar, Depression, Fearful and Frequent crying. Endocrine Not Present- Cold Intolerance, Excessive Hunger, Hair Changes, Heat Intolerance, Hot flashes and New Diabetes. Hematology Not Present- Easy Bruising, Excessive bleeding, Gland problems, HIV and Persistent Infections.   Vitals Elbert Ewings CMA; 06/12/2015 8:47 AM) 06/12/2015 8:46 AM Weight: 170 lb Height: 62in Body Surface Area: 1.84 m Body Mass Index: 31.09 kg/m Temp.: 98.64F(Oral)  Pulse: 76 (Regular)  Resp.: 17 (Unlabored)  BP: 138/72 (Sitting, Left Arm, Standard)    Physical Exam (Yosef Krogh E. Grandville Silos MD; 06/12/2015 9:06 AM) General Mental Status-Alert. General Appearance-Consistent with  stated age. Hydration-Well hydrated. Voice-Normal.  Head and Neck Head-normocephalic, atraumatic with no lesions or palpable masses.  Eye Eyeball - Bilateral-Extraocular movements intact. Sclera/Conjunctiva - Bilateral-No scleral icterus.  Chest and Lung Exam Chest and lung exam reveals -quiet, even and easy  respiratory effort with no use of accessory muscles and on auscultation, normal breath sounds, no adventitious sounds and normal vocal resonance. Inspection Chest Wall - Normal. Back - normal.  Cardiovascular Cardiovascular examination reveals -on palpation PMI is normal in location and amplitude, no palpable S3 or S4. Normal cardiac borders., normal heart sounds, regular rate and rhythm with no murmurs, carotid auscultation reveals no bruits and normal pedal pulses bilaterally.  Abdomen Inspection Inspection of the abdomen reveals - No Hernias. Skin - Scar - no surgical scars. Palpation/Percussion Palpation and Percussion of the abdomen reveal - Soft, Non Tender, No Rebound tenderness, No Rigidity (guarding) and No hepatosplenomegaly. Auscultation Auscultation of the abdomen reveals - Bowel sounds normal.  Rectal Note: External anal exam reveals a large external hemorrhoid without thrombosis on the right side, digital rectal exam reveals posterior left-sided internal hemorrhoids. Anoscopy was then done identifying internal hemorrhoids posterior and on the left side. They were grasped with a banding forceps but were sensate so we were unable to apply rubber bands at this time. Minimal bleeding with manipulation was noted.   Neurologic Neurologic evaluation reveals -alert and oriented x 3 with no impairment of recent or remote memory. Mental Status-Normal.  Musculoskeletal Normal Exam - Left-Upper Extremity Strength Normal and Lower Extremity Strength Normal. Normal Exam - Right-Upper Extremity Strength Normal, Lower Extremity strength  normal.    Assessment & Plan Elaine Neri E. Grandville Silos MD; 06/12/2015 9:07 AM) INTERNAL AND EXTERNAL HEMORRHOIDS WITHOUT COMPLICATION (410.3  U13.1) Impression: I have offered internal and external hemorrhoidectomy. We will plan this as an outpatient surgical procedure under general anesthetic. Procedure, risks, and benefits were discussed in detail with her. She is agreeable. I also advised that she speak with her gastroenterologist, Dr. Henrene Pastor, regarding her other GI issues.

## 2015-06-25 ENCOUNTER — Encounter (HOSPITAL_BASED_OUTPATIENT_CLINIC_OR_DEPARTMENT_OTHER)
Admission: RE | Admit: 2015-06-25 | Discharge: 2015-06-25 | Disposition: A | Discharge status: Home / Self Care | Payer: Medicare Other | Source: Ambulatory Visit | Attending: General Surgery | Admitting: General Surgery

## 2015-06-25 DIAGNOSIS — Z792 Long term (current) use of antibiotics: Secondary | ICD-10-CM | POA: Diagnosis not present

## 2015-06-25 DIAGNOSIS — K6282 Dysplasia of anus: Secondary | ICD-10-CM | POA: Diagnosis not present

## 2015-06-25 DIAGNOSIS — F17200 Nicotine dependence, unspecified, uncomplicated: Secondary | ICD-10-CM | POA: Diagnosis not present

## 2015-06-25 DIAGNOSIS — N289 Disorder of kidney and ureter, unspecified: Secondary | ICD-10-CM | POA: Diagnosis not present

## 2015-06-25 DIAGNOSIS — I739 Peripheral vascular disease, unspecified: Secondary | ICD-10-CM | POA: Diagnosis not present

## 2015-06-25 DIAGNOSIS — I10 Essential (primary) hypertension: Secondary | ICD-10-CM | POA: Diagnosis not present

## 2015-06-25 DIAGNOSIS — I4589 Other specified conduction disorders: Secondary | ICD-10-CM | POA: Diagnosis not present

## 2015-06-25 DIAGNOSIS — Z79899 Other long term (current) drug therapy: Secondary | ICD-10-CM | POA: Diagnosis not present

## 2015-06-25 DIAGNOSIS — K644 Residual hemorrhoidal skin tags: Secondary | ICD-10-CM | POA: Diagnosis not present

## 2015-06-25 DIAGNOSIS — I517 Cardiomegaly: Secondary | ICD-10-CM | POA: Diagnosis not present

## 2015-06-25 DIAGNOSIS — M1990 Unspecified osteoarthritis, unspecified site: Secondary | ICD-10-CM | POA: Diagnosis not present

## 2015-06-25 DIAGNOSIS — Z7952 Long term (current) use of systemic steroids: Secondary | ICD-10-CM | POA: Diagnosis not present

## 2015-06-25 DIAGNOSIS — K648 Other hemorrhoids: Secondary | ICD-10-CM | POA: Diagnosis not present

## 2015-06-25 LAB — BASIC METABOLIC PANEL
Anion gap: 10 (ref 5–15)
BUN: 23 mg/dL — ABNORMAL HIGH (ref 6–20)
CO2: 26 mmol/L (ref 22–32)
Calcium: 9.2 mg/dL (ref 8.9–10.3)
Chloride: 103 mmol/L (ref 101–111)
Creatinine, Ser: 1.37 mg/dL — ABNORMAL HIGH (ref 0.44–1.00)
GFR calc Af Amer: 44 mL/min — ABNORMAL LOW (ref >60)
GFR calc non Af Amer: 38 mL/min — ABNORMAL LOW (ref >60)
Glucose, Bld: 100 mg/dL — ABNORMAL HIGH (ref 65–99)
Potassium: 3.4 mmol/L — ABNORMAL LOW (ref 3.5–5.1)
Sodium: 139 mmol/L (ref 135–145)

## 2015-06-26 ENCOUNTER — Encounter (HOSPITAL_BASED_OUTPATIENT_CLINIC_OR_DEPARTMENT_OTHER)
Admission: RE | Disposition: A | Discharge status: Home / Self Care | Payer: Self-pay | Source: Ambulatory Visit | Attending: General Surgery

## 2015-06-26 ENCOUNTER — Ambulatory Visit (HOSPITAL_BASED_OUTPATIENT_CLINIC_OR_DEPARTMENT_OTHER)
Admission: RE | Admit: 2015-06-26 | Discharge: 2015-06-26 | Disposition: A | Discharge status: Home / Self Care | Payer: Medicare Other | Source: Ambulatory Visit | Attending: General Surgery | Admitting: General Surgery

## 2015-06-26 ENCOUNTER — Ambulatory Visit (HOSPITAL_BASED_OUTPATIENT_CLINIC_OR_DEPARTMENT_OTHER): Payer: Medicare Other | Admitting: Certified Registered"

## 2015-06-26 ENCOUNTER — Encounter (HOSPITAL_BASED_OUTPATIENT_CLINIC_OR_DEPARTMENT_OTHER): Payer: Self-pay

## 2015-06-26 DIAGNOSIS — I4589 Other specified conduction disorders: Secondary | ICD-10-CM | POA: Insufficient documentation

## 2015-06-26 DIAGNOSIS — Z7952 Long term (current) use of systemic steroids: Secondary | ICD-10-CM | POA: Diagnosis not present

## 2015-06-26 DIAGNOSIS — I739 Peripheral vascular disease, unspecified: Secondary | ICD-10-CM | POA: Insufficient documentation

## 2015-06-26 DIAGNOSIS — K648 Other hemorrhoids: Secondary | ICD-10-CM | POA: Insufficient documentation

## 2015-06-26 DIAGNOSIS — F17200 Nicotine dependence, unspecified, uncomplicated: Secondary | ICD-10-CM | POA: Diagnosis not present

## 2015-06-26 DIAGNOSIS — K644 Residual hemorrhoidal skin tags: Secondary | ICD-10-CM | POA: Diagnosis not present

## 2015-06-26 DIAGNOSIS — K6282 Dysplasia of anus: Secondary | ICD-10-CM | POA: Diagnosis not present

## 2015-06-26 DIAGNOSIS — N289 Disorder of kidney and ureter, unspecified: Secondary | ICD-10-CM | POA: Insufficient documentation

## 2015-06-26 DIAGNOSIS — Z79899 Other long term (current) drug therapy: Secondary | ICD-10-CM | POA: Diagnosis not present

## 2015-06-26 DIAGNOSIS — Z792 Long term (current) use of antibiotics: Secondary | ICD-10-CM | POA: Diagnosis not present

## 2015-06-26 DIAGNOSIS — I10 Essential (primary) hypertension: Secondary | ICD-10-CM | POA: Diagnosis not present

## 2015-06-26 DIAGNOSIS — M1990 Unspecified osteoarthritis, unspecified site: Secondary | ICD-10-CM | POA: Diagnosis not present

## 2015-06-26 DIAGNOSIS — I517 Cardiomegaly: Secondary | ICD-10-CM | POA: Diagnosis not present

## 2015-06-26 DIAGNOSIS — D013 Carcinoma in situ of anus and anal canal: Secondary | ICD-10-CM

## 2015-06-26 HISTORY — DX: Carcinoma in situ of anus and anal canal: D01.3

## 2015-06-26 HISTORY — PX: HEMORRHOID SURGERY: SHX153

## 2015-06-26 HISTORY — DX: Unspecified hemorrhoids: K64.9

## 2015-06-26 LAB — POCT HEMOGLOBIN-HEMACUE: Hemoglobin: 12.7 g/dL (ref 12.0–15.0)

## 2015-06-26 SURGERY — HEMORRHOIDECTOMY
Anesthesia: General | Site: Rectum

## 2015-06-26 MED ORDER — SCOPOLAMINE 1 MG/3DAYS TD PT72: 1.0000 | MEDICATED_PATCH | Freq: Once | TRANSDERMAL | Status: DC | PRN

## 2015-06-26 MED ORDER — BUPIVACAINE LIPOSOME 1.3 % IJ SUSP
INTRAMUSCULAR | Status: AC
  Filled 2015-06-26: qty 20

## 2015-06-26 MED ORDER — FENTANYL CITRATE (PF) 100 MCG/2ML IJ SOLN: 25.0000 ug | INTRAMUSCULAR | Status: DC | PRN

## 2015-06-26 MED ORDER — LIDOCAINE HCL (CARDIAC) 20 MG/ML IV SOLN
INTRAVENOUS | Status: DC | PRN
  Administered 2015-06-26: 60 mg via INTRAVENOUS

## 2015-06-26 MED ORDER — PROPOFOL 10 MG/ML IV BOLUS
INTRAVENOUS | Status: DC | PRN
  Administered 2015-06-26: 100 mg via INTRAVENOUS

## 2015-06-26 MED ORDER — FENTANYL CITRATE (PF) 100 MCG/2ML IJ SOLN
50.0000 ug | INTRAMUSCULAR | Status: DC | PRN
  Administered 2015-06-26: 50 ug via INTRAVENOUS
  Administered 2015-06-26: 25 ug via INTRAVENOUS

## 2015-06-26 MED ORDER — GLYCOPYRROLATE 0.2 MG/ML IJ SOLN: 0.2000 mg | Freq: Once | INTRAMUSCULAR | Status: DC | PRN

## 2015-06-26 MED ORDER — OXYCODONE HCL 5 MG PO TABS: 5.0000 mg | ORAL_TABLET | Freq: Once | ORAL | Status: DC | PRN

## 2015-06-26 MED ORDER — ONDANSETRON HCL 4 MG/2ML IJ SOLN: 4.0000 mg | Freq: Four times a day (QID) | INTRAMUSCULAR | Status: DC | PRN

## 2015-06-26 MED ORDER — MIDAZOLAM HCL 2 MG/2ML IJ SOLN: 1.0000 mg | INTRAMUSCULAR | Status: DC | PRN

## 2015-06-26 MED ORDER — ONDANSETRON HCL 4 MG/2ML IJ SOLN
INTRAMUSCULAR | Status: DC | PRN
  Administered 2015-06-26: 4 mg via INTRAVENOUS

## 2015-06-26 MED ORDER — CEFAZOLIN SODIUM-DEXTROSE 2-3 GM-% IV SOLR
INTRAVENOUS | Status: AC
  Filled 2015-06-26: qty 50

## 2015-06-26 MED ORDER — LACTATED RINGERS IV SOLN
INTRAVENOUS | Status: DC
  Administered 2015-06-26: 12:00:00 via INTRAVENOUS

## 2015-06-26 MED ORDER — BUPIVACAINE-EPINEPHRINE (PF) 0.5% -1:200000 IJ SOLN
INTRAMUSCULAR | Status: AC
  Filled 2015-06-26: qty 30

## 2015-06-26 MED ORDER — THROMBIN 5000 UNITS EX SOLR
CUTANEOUS | Status: DC | PRN
  Administered 2015-06-26: 5000 [IU] via TOPICAL

## 2015-06-26 MED ORDER — CHLORHEXIDINE GLUCONATE 4 % EX LIQD: 1.0000 "application " | Freq: Once | CUTANEOUS | Status: DC

## 2015-06-26 MED ORDER — FENTANYL CITRATE (PF) 100 MCG/2ML IJ SOLN
INTRAMUSCULAR | Status: AC
  Filled 2015-06-26: qty 6

## 2015-06-26 MED ORDER — OXYCODONE HCL 5 MG/5ML PO SOLN: 5.0000 mg | Freq: Once | ORAL | Status: DC | PRN

## 2015-06-26 MED ORDER — DEXAMETHASONE SODIUM PHOSPHATE 4 MG/ML IJ SOLN
INTRAMUSCULAR | Status: DC | PRN
  Administered 2015-06-26: 4 mg via INTRAVENOUS

## 2015-06-26 MED ORDER — BUPIVACAINE LIPOSOME 1.3 % IJ SUSP
INTRAMUSCULAR | Status: DC | PRN
  Administered 2015-06-26: 20 mL

## 2015-06-26 MED ORDER — OXYCODONE HCL 5 MG PO TABS: 5.0000 mg | ORAL_TABLET | ORAL | Status: DC | PRN

## 2015-06-26 MED ORDER — BUPIVACAINE LIPOSOME 1.3 % IJ SUSP: 20.0000 mL | Freq: Once | INTRAMUSCULAR | Status: DC

## 2015-06-26 MED ORDER — THROMBIN 5000 UNITS EX SOLR
CUTANEOUS | Status: AC
  Filled 2015-06-26: qty 5000

## 2015-06-26 MED ORDER — CEFAZOLIN SODIUM-DEXTROSE 2-3 GM-% IV SOLR
2.0000 g | INTRAVENOUS | Status: AC
  Administered 2015-06-26: 2 g via INTRAVENOUS

## 2015-06-26 SURGICAL SUPPLY — 42 items
BLADE SURG 11 STRL SS (BLADE) IMPLANT
BLADE SURG 15 STRL LF DISP TIS (BLADE) ×1 IMPLANT
BLADE SURG 15 STRL SS (BLADE) ×1
CANISTER SUCT 1200ML W/VALVE (MISCELLANEOUS) ×2 IMPLANT
CLEANER CAUTERY TIP 5X5 PAD (MISCELLANEOUS) ×1 IMPLANT
COVER MAYO STAND STRL (DRAPES) ×2 IMPLANT
DECANTER SPIKE VIAL GLASS SM (MISCELLANEOUS) IMPLANT
DRAPE UTILITY XL STRL (DRAPES) ×2 IMPLANT
DRSG EMULSION OIL 3X3 NADH (GAUZE/BANDAGES/DRESSINGS) IMPLANT
DRSG PAD ABDOMINAL 8X10 ST (GAUZE/BANDAGES/DRESSINGS) ×2 IMPLANT
ELECT REM PT RETURN 9FT ADLT (ELECTROSURGICAL) ×2
ELECTRODE REM PT RTRN 9FT ADLT (ELECTROSURGICAL) ×1 IMPLANT
GAUZE PETROLATUM 1 X8 (GAUZE/BANDAGES/DRESSINGS) IMPLANT
GLOVE BIO SURGEON STRL SZ 6.5 (GLOVE) ×2 IMPLANT
GLOVE BIO SURGEON STRL SZ8 (GLOVE) ×2 IMPLANT
GLOVE BIOGEL PI IND STRL 7.0 (GLOVE) ×1 IMPLANT
GLOVE BIOGEL PI IND STRL 8 (GLOVE) ×1 IMPLANT
GLOVE BIOGEL PI INDICATOR 7.0 (GLOVE) ×1
GLOVE BIOGEL PI INDICATOR 8 (GLOVE) ×1
GOWN STRL REUS W/ TWL LRG LVL3 (GOWN DISPOSABLE) ×1 IMPLANT
GOWN STRL REUS W/ TWL XL LVL3 (GOWN DISPOSABLE) ×1 IMPLANT
GOWN STRL REUS W/TWL LRG LVL3 (GOWN DISPOSABLE) ×1
GOWN STRL REUS W/TWL XL LVL3 (GOWN DISPOSABLE) ×1
NEEDLE HYPO 22GX1.5 SAFETY (NEEDLE) ×2 IMPLANT
PACK BASIN DAY SURGERY FS (CUSTOM PROCEDURE TRAY) ×2 IMPLANT
PACK LITHOTOMY IV (CUSTOM PROCEDURE TRAY) ×2 IMPLANT
PAD CLEANER CAUTERY TIP 5X5 (MISCELLANEOUS) ×1
PENCIL BUTTON HOLSTER BLD 10FT (ELECTRODE) ×2 IMPLANT
SHEARS HARMONIC 9CM CVD (BLADE) ×2 IMPLANT
SPONGE GAUZE 4X4 12PLY STER LF (GAUZE/BANDAGES/DRESSINGS) ×4 IMPLANT
SPONGE HEMORRHOID 8X3CM (HEMOSTASIS) ×2 IMPLANT
SPONGE SURGIFOAM ABS GEL 100 (HEMOSTASIS) IMPLANT
SURGILUBE 2OZ TUBE FLIPTOP (MISCELLANEOUS) ×2 IMPLANT
SUT CHROMIC 3 0 SH 27 (SUTURE) ×4 IMPLANT
SYR CONTROL 10ML LL (SYRINGE) ×2 IMPLANT
TOWEL OR 17X24 6PK STRL BLUE (TOWEL DISPOSABLE) ×4 IMPLANT
TOWEL OR NON WOVEN STRL DISP B (DISPOSABLE) IMPLANT
TRAY DSU PREP LF (CUSTOM PROCEDURE TRAY) ×2 IMPLANT
TRAY PROCTOSCOPIC FIBER OPTIC (SET/KITS/TRAYS/PACK) IMPLANT
TUBE CONNECTING 20X1/4 (TUBING) ×2 IMPLANT
UNDERPAD 30X30 (UNDERPADS AND DIAPERS) ×2 IMPLANT
YANKAUER SUCT BULB TIP NO VENT (SUCTIONS) ×2 IMPLANT

## 2015-06-26 NOTE — Op Note (Signed)
06/26/2015  1:16 PM  PATIENT:  Elaine Turner  69 y.o. female  PRE-OPERATIVE DIAGNOSIS:  internal and external hemorrohids  POST-OPERATIVE DIAGNOSIS:  internal and external hemorrohids  PROCEDURE:  Procedure(s): INTERNAL HEMORRHOIDECTOMY - SINGLE COLUMN EXTERNAL HEMORRHOIDECTOMY  SURGEON:  Surgeon(s): Georganna Skeans, MD  ASSISTANTS: none   ANESTHESIA:   local and general  EBL:     BLOOD ADMINISTERED:none  DRAINS: none   SPECIMEN:  Excision  DISPOSITION OF SPECIMEN:  PATHOLOGY  COUNTS:  YES  DICTATION: .Dragon Dictation [name] presents for internal and external hemorrhoidectomy. She was identified in the preop holding area. She received intravenous antibiotics. Informed consent was obtained. She was brought In the room and general anesthesia with laryngeal mask airway was administered by the anesthesia staff. She was placed in lithotomy.Her perianal region was prepped and draped in a sterile fashion. We did time out procedure. Undiluted Exparel was injected in the perianal region for postoperative pain relief. Examination revealed a large anterior external hemorrhoid. There was also a very large internal hemorrhoid at about 7:00 position. Attention was first directed to the external hemorrhoid. The anoderm was incised with a scalpel. The harmonic scalpel was then used to remove the hemorrhoid in its entirety. Cautery was used at the base achieving good hemostasis. Next, the anoderm at the level of the internal hemorrhoid was incised. Subcutaneous tissues were dissected down the vein was lifted up. The hemorrhoid was then removed with the harmonic scalpel achieving excellent hemostasis. The base of this area was also cauterized achieving excellent hemostasis. Were no other significant enlarged hemorrhoids. Hemostasis was ensured in both sites. An endorectal dressing of Gelfoam soaked in thrombin was then placed. All counts were correct. She tolerated the procedure well without apparent  complication and was taken recovery in stable condition. PATIENT DISPOSITION:  PACU - hemodynamically stable.   Delay start of Pharmacological VTE agent (>24hrs) due to surgical blood loss or risk of bleeding:  no  Georganna Skeans, MD, MPH, FACS Pager: [phone]  6/29/20161:16 PM

## 2015-06-26 NOTE — Discharge Instructions (Signed)

## 2015-06-26 NOTE — Interval H&P Note (Signed)
History and Physical Interval Note:  06/26/2015 12:26 PM  Elaine Turner  has presented today for surgery, with the diagnosis of internal and external hemorrohids  The various methods of treatment have been discussed with the patient and family. After consideration of risks, benefits and other options for treatment, the patient has consented to  Procedure(s): INTERNAL AND EXTERNAL HEMORRHOIDECTOMY (N/A) as a surgical intervention .  The patient's history has been reviewed, patient examined, no change in status, stable for surgery.  I have reviewed the patient's chart and labs.  Questions were answered to the patient's satisfaction.     Witney Huie E

## 2015-06-26 NOTE — Transfer of Care (Signed)
Immediate Anesthesia Transfer of Care Note  Patient: Elaine Turner  Procedure(s) Performed: Procedure(s): INTERNAL AND EXTERNAL HEMORRHOIDECTOMY (N/A)  Patient Location: PACU  Anesthesia Type:General  Level of Consciousness: awake, alert , oriented and patient cooperative  Airway & Oxygen Therapy: Patient Spontanous Breathing and Patient connected to face mask oxygen  Post-op Assessment: Report given to RN and Post -op Vital signs reviewed and stable  Post vital signs: Reviewed and stable  Last Vitals:  Filed Vitals:   06/26/15 1138  BP: 161/60  Pulse: 75  Temp: 36.6 C  Resp: 20    Complications: No apparent anesthesia complications

## 2015-06-26 NOTE — Anesthesia Postprocedure Evaluation (Signed)
Anesthesia Post Note  Patient: Elaine Turner  Procedure(s) Performed: Procedure(s) (LRB): INTERNAL AND EXTERNAL HEMORRHOIDECTOMY (N/A)  Anesthesia type: General  Patient location: PACU  Post pain: Pain level controlled and Adequate analgesia  Post assessment: Post-op Vital signs reviewed, Patient's Cardiovascular Status Stable, Respiratory Function Stable, Patent Airway and Pain level controlled  Last Vitals:  Filed Vitals:   06/26/15 1400  BP: 145/56  Pulse: 64  Temp:   Resp: 21    Post vital signs: Reviewed and stable  Level of consciousness: awake, alert  and oriented  Complications: No apparent anesthesia complications

## 2015-06-26 NOTE — Anesthesia Preprocedure Evaluation (Signed)
Anesthesia Evaluation  Patient identified by MRN, date of birth, ID band Patient awake    Reviewed: Allergy & Precautions, NPO status , Patient's Chart, lab work & pertinent test results  Airway Mallampati: II   Neck ROM: full    Dental   Pulmonary Current Smoker,  breath sounds clear to auscultation        Cardiovascular hypertension, + Peripheral Vascular Disease Rhythm:regular Rate:Normal     Neuro/Psych    GI/Hepatic   Endo/Other    Renal/GU Renal InsufficiencyRenal disease     Musculoskeletal  (+) Arthritis -,   Abdominal   Peds  Hematology   Anesthesia Other Findings   Reproductive/Obstetrics                             Anesthesia Physical Anesthesia Plan  ASA: III  Anesthesia Plan: General   Post-op Pain Management:    Induction: Intravenous  Airway Management Planned: LMA  Additional Equipment:   Intra-op Plan:   Post-operative Plan:   Informed Consent: I have reviewed the patients History and Physical, chart, labs and discussed the procedure including the risks, benefits and alternatives for the proposed anesthesia with the patient or authorized representative who has indicated his/her understanding and acceptance.     Plan Discussed with: CRNA, Anesthesiologist and Surgeon  Anesthesia Plan Comments:         Anesthesia Quick Evaluation

## 2015-06-26 NOTE — Anesthesia Procedure Notes (Signed)
Procedure Name: LMA Insertion Date/Time: 06/26/2015 12:43 PM Performed by: Donaldo Teegarden D Pre-anesthesia Checklist: Patient identified, Emergency Drugs available, Suction available and Patient being monitored Patient Re-evaluated:Patient Re-evaluated prior to inductionOxygen Delivery Method: Circle System Utilized Preoxygenation: Pre-oxygenation with 100% oxygen Intubation Type: IV induction Ventilation: Mask ventilation without difficulty LMA: LMA inserted LMA Size: 4.0 Number of attempts: 1 Airway Equipment and Method: Bite block Placement Confirmation: positive ETCO2 Tube secured with: Tape Dental Injury: Teeth and Oropharynx as per pre-operative assessment

## 2015-06-27 ENCOUNTER — Encounter (HOSPITAL_BASED_OUTPATIENT_CLINIC_OR_DEPARTMENT_OTHER): Payer: Self-pay | Admitting: General Surgery

## 2015-07-08 ENCOUNTER — Ambulatory Visit: Payer: Medicare Other | Admitting: Internal Medicine

## 2015-10-03 ENCOUNTER — Other Ambulatory Visit: Payer: Self-pay | Admitting: Internal Medicine

## 2015-10-03 NOTE — Progress Notes (Signed)
I have copied the x-ray report to be mailed to her with a request that she contact us to schedule the CT scan.

## 2015-10-04 ENCOUNTER — Other Ambulatory Visit (INDEPENDENT_AMBULATORY_CARE_PROVIDER_SITE_OTHER): Payer: Medicare Other

## 2015-10-04 ENCOUNTER — Encounter: Payer: Self-pay | Admitting: Internal Medicine

## 2015-10-04 ENCOUNTER — Ambulatory Visit (INDEPENDENT_AMBULATORY_CARE_PROVIDER_SITE_OTHER): Payer: Medicare Other | Admitting: Internal Medicine

## 2015-10-04 VITALS — BP 142/74 | HR 73 | Temp 98.0°F | Resp 18 | Wt 170.0 lb

## 2015-10-04 DIAGNOSIS — I10 Essential (primary) hypertension: Secondary | ICD-10-CM | POA: Diagnosis not present

## 2015-10-04 DIAGNOSIS — Q279 Congenital malformation of peripheral vascular system, unspecified: Secondary | ICD-10-CM | POA: Diagnosis not present

## 2015-10-04 DIAGNOSIS — Z23 Encounter for immunization: Secondary | ICD-10-CM | POA: Diagnosis not present

## 2015-10-04 DIAGNOSIS — R911 Solitary pulmonary nodule: Secondary | ICD-10-CM

## 2015-10-04 DIAGNOSIS — M79642 Pain in left hand: Secondary | ICD-10-CM

## 2015-10-04 LAB — CBC WITH DIFFERENTIAL/PLATELET
Basophils Absolute: 0.1 10*3/uL (ref 0.0–0.1)
Basophils Relative: 0.4 % (ref 0.0–3.0)
Eosinophils Absolute: 0.4 10*3/uL (ref 0.0–0.7)
Eosinophils Relative: 3 % (ref 0.0–5.0)
HCT: 40.1 % (ref 36.0–46.0)
Hemoglobin: 12.8 g/dL (ref 12.0–15.0)
Lymphocytes Relative: 16.2 % (ref 12.0–46.0)
Lymphs Abs: 2 10*3/uL (ref 0.7–4.0)
MCHC: 31.8 g/dL (ref 30.0–36.0)
MCV: 75.9 fl — ABNORMAL LOW (ref 78.0–100.0)
Monocytes Absolute: 0.8 10*3/uL (ref 0.1–1.0)
Monocytes Relative: 6.1 % (ref 3.0–12.0)
Neutro Abs: 9.3 10*3/uL — ABNORMAL HIGH (ref 1.4–7.7)
Neutrophils Relative %: 74.3 % (ref 43.0–77.0)
Platelets: 458 10*3/uL — ABNORMAL HIGH (ref 150.0–400.0)
RBC: 5.28 Mil/uL — ABNORMAL HIGH (ref 3.87–5.11)
RDW: 17.2 % — ABNORMAL HIGH (ref 11.5–15.5)
WBC: 12.6 10*3/uL — ABNORMAL HIGH (ref 4.0–10.5)

## 2015-10-04 LAB — BASIC METABOLIC PANEL
BUN: 25 mg/dL — ABNORMAL HIGH (ref 6–23)
CO2: 27 mEq/L (ref 19–32)
Calcium: 9.4 mg/dL (ref 8.4–10.5)
Chloride: 102 mEq/L (ref 96–112)
Creatinine, Ser: 1.35 mg/dL — ABNORMAL HIGH (ref 0.40–1.20)
GFR: 49.62 mL/min — ABNORMAL LOW (ref >60.00)
Glucose, Bld: 98 mg/dL (ref 70–99)
Potassium: 3.7 mEq/L (ref 3.5–5.1)
Sodium: 138 mEq/L (ref 135–145)

## 2015-10-04 MED ORDER — NIFEDIPINE ER OSMOTIC RELEASE 90 MG PO TB24: ORAL_TABLET | ORAL | Status: DC

## 2015-10-04 MED ORDER — LOSARTAN POTASSIUM-HCTZ 50-12.5 MG PO TABS: 1.0000 | ORAL_TABLET | Freq: Every day | ORAL | Status: DC

## 2015-10-04 MED ORDER — TRAMADOL HCL 50 MG PO TABS: 50.0000 mg | ORAL_TABLET | Freq: Three times a day (TID) | ORAL | Status: DC | PRN

## 2015-10-04 NOTE — Progress Notes (Signed)
Subjective:    Patient ID: Elaine Turner, female    DOB: 05-16-1946, 69 y.o.   MRN: [id number]  HPI She describes pain in the left wrist for 3 weeks. It's intermittent and throbbing-burning up to level VI. She's noted a bump along the ventral wrist area as well. She has pain across the base of the thumb and dorsum of the hand involving the index finger and thumb mainly. There was no trigger or injury for this  She has run out of her blood pressure medicines and has requested refill. She does not check her blood pressure at home. She eats red meat, fried foods, and salt. She does not exercise.  She continues to smoke a pack a day.   In June a nodule in the left lung was suggested on CXray.. Initial interpretation was possible calcium in the nipple but this appears much higher than the breast tissue. She has smoked from age 4 to the present up to a pack a day. She now smokes half a pack a day. She denies any active cardio pulmonary symptoms.  She never returned calls or mailed requests  to return for the CT scan of the chest. Her home phone number and address were verified. She states she does give out her cell phone number and has no recorder on her home phone.  Review of Systems She denies numbness, tingling, weakness in the hand.  She has no cough, sputum production, hemoptysis, fever, chills, sweats, weight loss.  She denies chest pain, palpitations, or edema.    Objective:   Physical Exam Pertinent or positive findings include: Very poor dentition is present with multiple caries. Breath sounds are decreased. She has a grade 1 systolic murmur. She exhibits marked trauma to the nails which are foreshortened.  At the base of the left thumb there is a 13 x 10 mm subcutaneous structure which appears to pulsate suggesting vascular etiology. There is a much smaller 6 x 6 mm lesion which also pulsates on the ventral wrist and the radial artery distribution. She has DIP osteoarthritic  changes.  General appearance :adequately nourished; in no distress.  Eyes: No conjunctival inflammation or scleral icterus is present.  Oral exam:  Lips and gums are healthy appearing.There is no oropharyngeal erythema or exudate noted.   Heart:  Normal rate and regular rhythm. S1 and S2 normal without gallop,  click, rub or other extra sounds    Lungs:Chest clear to auscultation; no wheezes, rhonchi,rales ,or rubs present.No increased work of breathing.   Abdomen: bowel sounds normal, soft and non-tender without masses, organomegaly or hernias noted.  No guarding or rebound.   Vascular : all pulses equal ; no bruits present.  Skin:Warm & dry.  Intact without suspicious lesions or rashes ; no tenting    Lymphatic: No lymphadenopathy is noted about the head, neck, axilla.   Neuro: Strength, tone  normal.     Assessment & Plan:  #1 pain at the base of the left thumb and dorsum of the hand,? tenosynovitis  #2 possible aneurysmal dilation of the arteries in the left hand ventrally and dorsally  #3 pulmonary nodule  #4 active smoker  Plan: Hand surgeon consultation will be pursued to treat possible tenosynovitis. That expert's opinion as to the nature of the possible vascular structures will be requested as well. Vascular surgery consultation may be indicated as well.  She was given a copy of the x-ray report; CT scan without contrast will be scheduled. Home address & home  telephone [phone]) were verified

## 2015-10-04 NOTE — Progress Notes (Signed)
Patient received education resource, including the self-management goal and tool. Patient verbalized understanding. 

## 2015-10-04 NOTE — Patient Instructions (Signed)
Minimal Blood Pressure Goal= AVERAGE < 140/90;  Ideal is an AVERAGE < 135/85. This AVERAGE should be calculated from @ least 5-7 BP readings taken @ different times of day on different days of week. You should not respond to isolated BP readings , but rather the AVERAGE for that week .Please bring your  blood pressure cuff to office visits to verify that it is reliable.It  can also be checked against the blood pressure device at the pharmacy. Finger or wrist cuffs are not dependable; an arm cuff is.   Your next office appointment will be determined based upon review of your pending labs  and  xrays  Those written interpretation of the lab results and instructions will be transmitted to you by mail for your records to the address listed in the chart as we verified..  Critical results will be called to your home number as verified..   Followup as needed for any active or acute issue. Please report any significant change in your symptoms.

## 2015-10-16 ENCOUNTER — Telehealth: Payer: Self-pay | Admitting: Emergency Medicine

## 2015-10-16 ENCOUNTER — Other Ambulatory Visit: Payer: Self-pay | Admitting: Internal Medicine

## 2015-10-16 ENCOUNTER — Telehealth: Payer: Self-pay

## 2015-10-16 ENCOUNTER — Ambulatory Visit (INDEPENDENT_AMBULATORY_CARE_PROVIDER_SITE_OTHER)
Admission: RE | Admit: 2015-10-16 | Discharge: 2015-10-16 | Disposition: A | Discharge status: Home / Self Care | Payer: Medicare Other | Source: Ambulatory Visit | Attending: Internal Medicine | Admitting: Internal Medicine

## 2015-10-16 DIAGNOSIS — R911 Solitary pulmonary nodule: Secondary | ICD-10-CM

## 2015-10-16 IMAGING — CT CT CHEST W/O CM
2 of 4 series · 15 of 36 positions shown, 18 images · IV contrast (Omnipaque 300)
Comparison: Chest x-ray [DATE]

CLINICAL DATA: Follow-up possible nodule seen on chest x-ray.
Smoker.

EXAM:
CT CHEST WITHOUT CONTRAST
TECHNIQUE: Multidetector CT imaging of the chest was performed following the
standard protocol without IV contrast.

[Series 2: chest routine with · axial · 0.61mm/px · z∈[-278,-48]mm · 12 of 56 slices shown, 15 images]
[im 5/56  mediastinal]
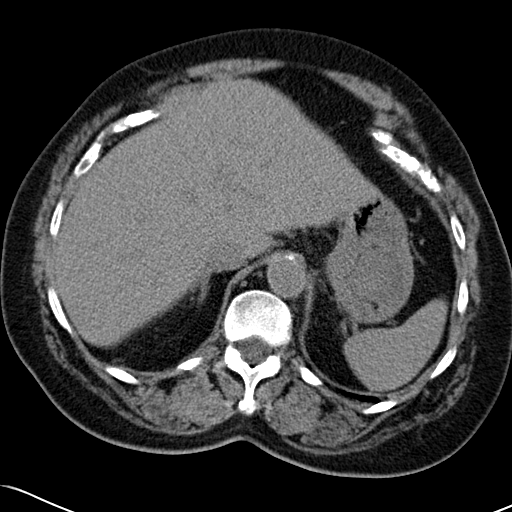
[im 5/56  lung]
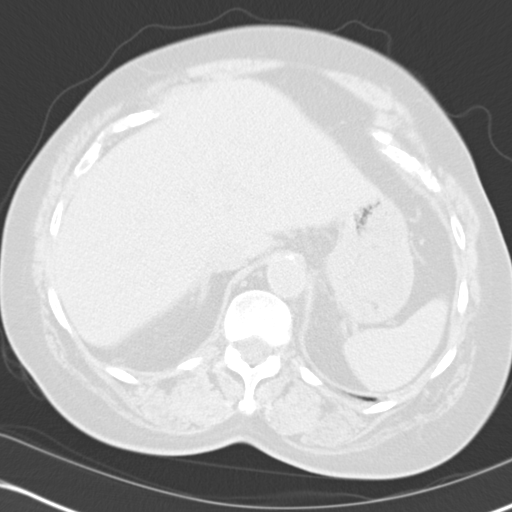
[im 9/56  lung]
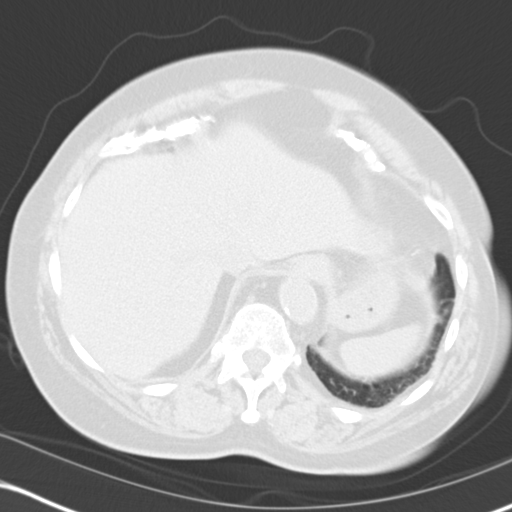
[im 13/56  lung]
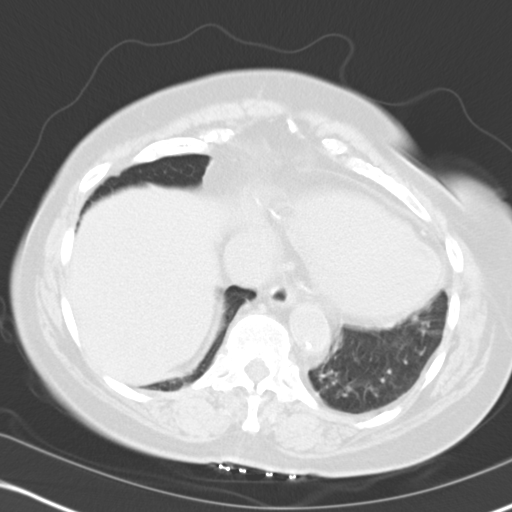
[im 17/56  lung]
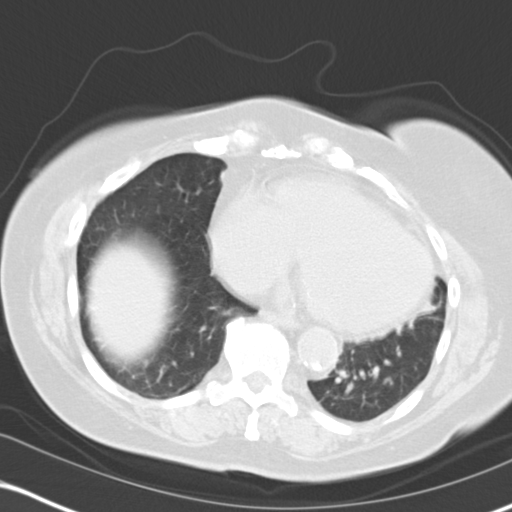
[im 22/56  mediastinal]
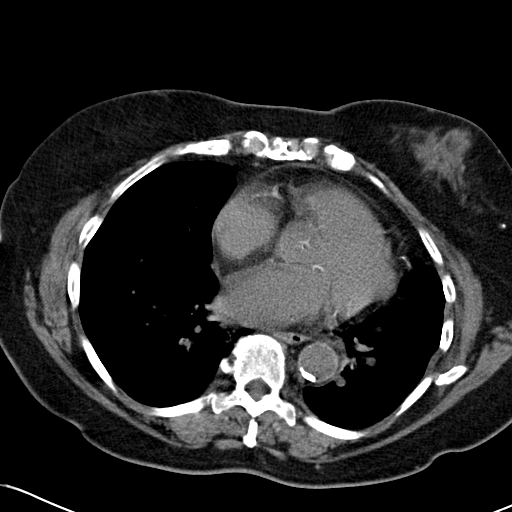
[im 22/56  lung]
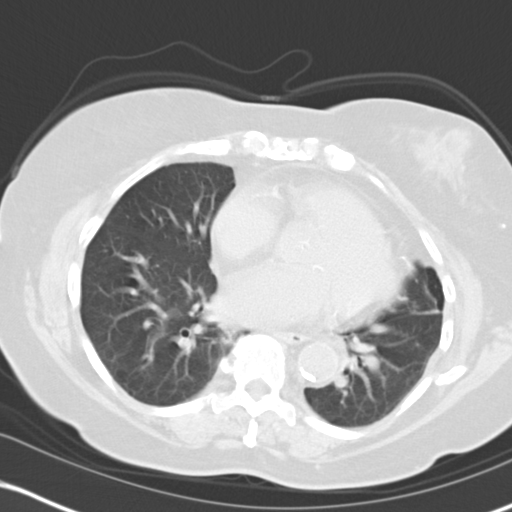
[im 26/56  lung]
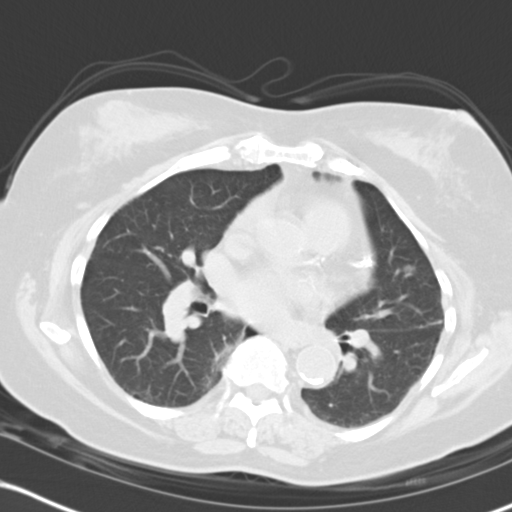
[im 30/56  lung]
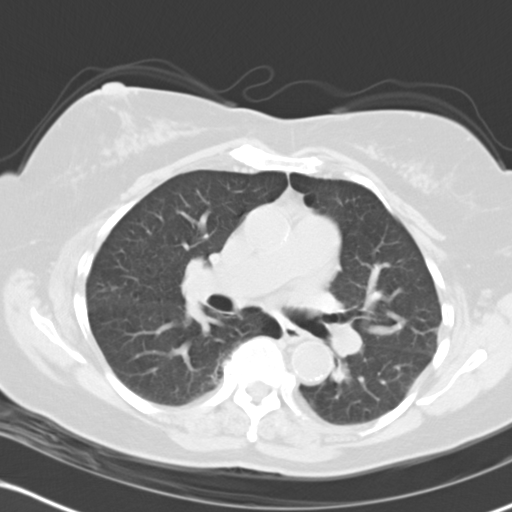
[im 34/56  lung]
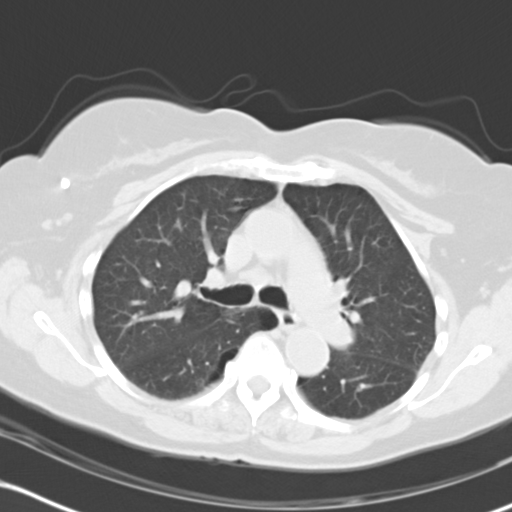
[im 39/56  mediastinal]
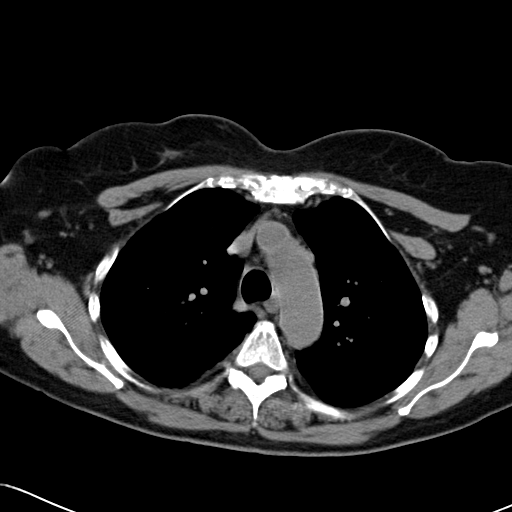
[im 39/56  lung]
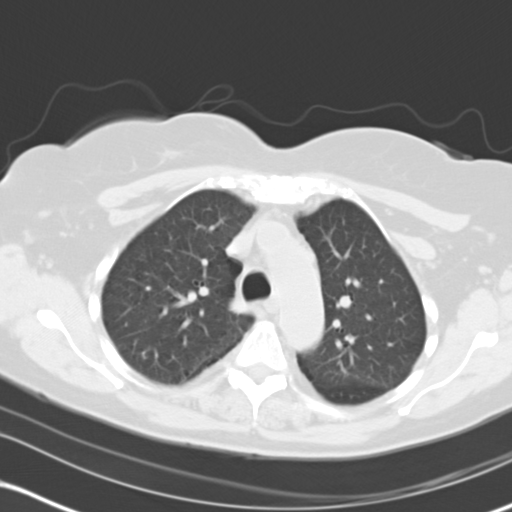
[im 43/56  lung]
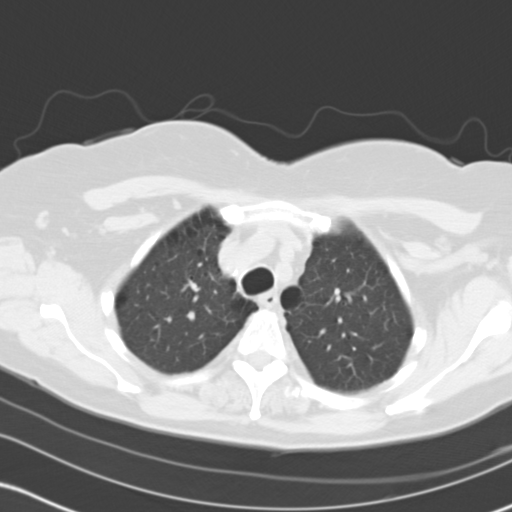
[im 47/56  lung]
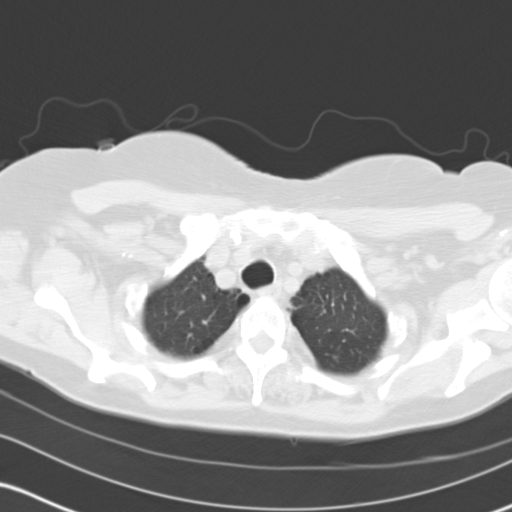
[im 51/56  lung]
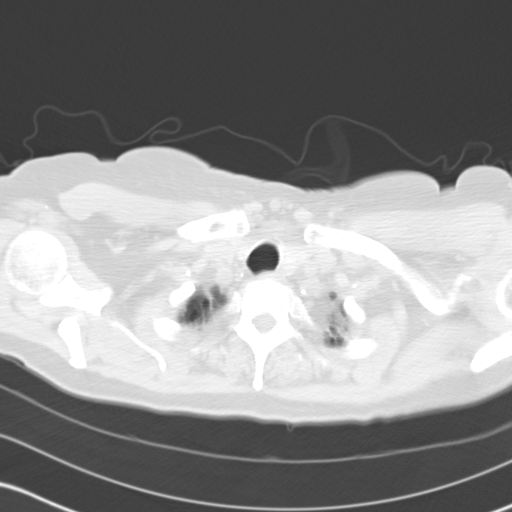

[Series 602: cor · coronal · 0.61mm/px · 3 of 98 slices shown]
[im 20/98  lung]
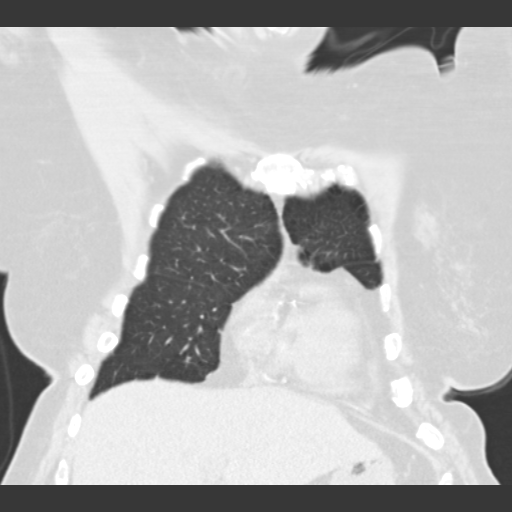
[im 39/98  lung]
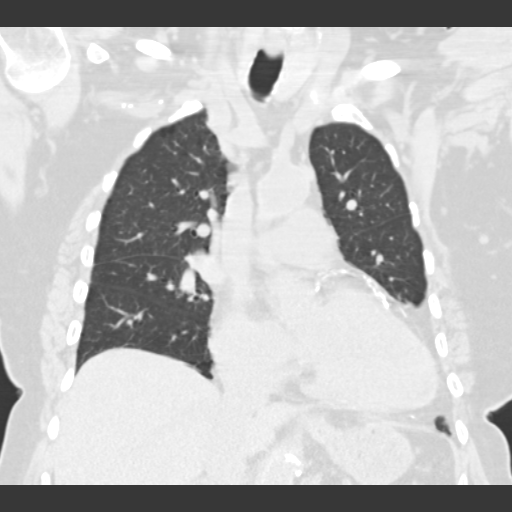
[im 59/98  lung]
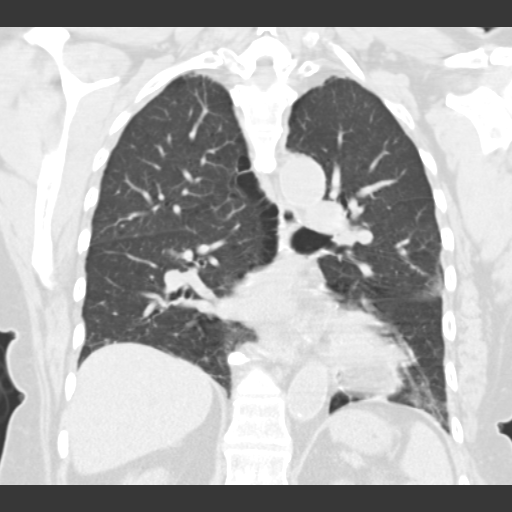

[15 of 36 positions shown; findings below may reference images not displayed]

FINDINGS: Mild paraseptal emphysema in the upper lobes bilaterally. There is a
nodule in the left lingula corresponding to the density seen on
prior chest x-ray measuring 9 mm. This is mildly lobular. No
calcifications within the nodule. No other suspicious nodule within
the lungs. No confluent airspace opacities or effusions.

Heart is mildly enlarged. Densely calcified coronary arteries
diffusely. Moderate aortic calcifications. No aneurysm. No
mediastinal, hilar, or axillary adenopathy. Chest wall soft tissues
are unremarkable. Imaging into the upper abdomen shows no acute
findings.

No acute bony abnormality.
IMPRESSION: 9 mm lingular nodule corresponding to the density on prior chest
x-ray. Cannot exclude malignancy. Consider further evaluation with
PET CT or close interval follow-up with repeat chest CT in 3-6
months.

Coronary artery disease, aortic atherosclerosis.

Cardiomegaly.

## 2015-10-16 NOTE — Telephone Encounter (Signed)
-----   Message from Hendricks Limes, MD sent at 10/16/2015  1:21 PM EDT ----- As I thought; this is not calcium in the areola or nipple but rather a pulmonary nodule. It will require follow-up. As I'm retiring in December; I shall refer you to Pulmonary to guarantee long term continuity of care. [employer]

## 2015-10-16 NOTE — Telephone Encounter (Signed)
Elaine Turner from [facility] wanted to call in the results:   9 mm lingular nodule corresponding to density of previous chest xray. Can not exclude malignancy. Consider further eval with PET CT or close follow up with Chest CT in 3-4 months.   Ordering MD is aware of the results.

## 2015-10-16 NOTE — Telephone Encounter (Signed)
Please call pt

## 2015-10-16 NOTE — Telephone Encounter (Signed)
I apologize; I thought she was Jolayne Haines who is my patient not Dr. Judi Cong.  This would be optimally evaluated by pulmonology; referral has been made.

## 2015-10-16 NOTE — Telephone Encounter (Signed)
Tried to call pt but pt did not answer and no voicemail.   Pt called back: informed that MD requested that she follow up and monitor the nodule via Pulmonary.

## 2015-10-24 ENCOUNTER — Encounter: Payer: Self-pay | Admitting: Internal Medicine

## 2015-10-24 ENCOUNTER — Ambulatory Visit (INDEPENDENT_AMBULATORY_CARE_PROVIDER_SITE_OTHER): Payer: Medicare Other | Admitting: Internal Medicine

## 2015-10-24 VITALS — BP 144/66 | HR 77 | Ht 62.0 in | Wt 173.4 lb

## 2015-10-24 DIAGNOSIS — F17210 Nicotine dependence, cigarettes, uncomplicated: Secondary | ICD-10-CM | POA: Diagnosis not present

## 2015-10-24 DIAGNOSIS — J449 Chronic obstructive pulmonary disease, unspecified: Secondary | ICD-10-CM | POA: Diagnosis not present

## 2015-10-24 DIAGNOSIS — R911 Solitary pulmonary nodule: Secondary | ICD-10-CM

## 2015-10-24 HISTORY — DX: Chronic obstructive pulmonary disease, unspecified: J44.9

## 2015-10-24 NOTE — Assessment & Plan Note (Signed)
Spirometry  10/24/2015  FEV1  1.02(60%)  ratio 74   She doesn't actually meet the criteria for COPD so certainly not too late for her to stop smoking. She would need a formal set of PFTs to sort out whether she is a candidate for lingulectomy or left upper lobectomy should this prove to be a tumor

## 2015-10-24 NOTE — Progress Notes (Signed)
   Subjective:    Patient ID: Elaine Turner, female    DOB: [birth year],    MRN: [id number]  HPI  45 yobf active smoker with bad cough in summer of 2016 > cxr > ct and referred 10/24/2015 to pulmonary clinic by Dr Linna Darner for spn    10/24/2015 1st [facility] office visit/ [name]   Chief Complaint  Patient presents with  . Pulmonary Consult    Referred by Dr. Unice Cobble for eval of pulmonary nodule. She was coughing back in June 2016 and cxr was done, then CT Chest done to f/u on ? nodule on 10/16/15. Pt denies any respiratory co's today.   Not limited by breathing from desired activities  = house work/ yard work including raking  Cough resolved  No obvious  day to day or daytime variabilty or assoc cp or chest tightness, subjective wheeze overt sinus or hb symptoms. No unusual exp hx or h/o childhood pna/ asthma or knowledge of premature birth.  Sleeping ok without nocturnal  or early am exacerbation  of respiratory  c/o's or need for noct saba. Also denies any obvious fluctuation of symptoms with weather or environmental changes or other aggravating or alleviating factors except as outlined above   Current Medications, Allergies, Complete Past Medical History, Past Surgical History, Family History, and Social History were reviewed in [employer] record.             Review of Systems  Constitutional: Negative for fever, chills and unexpected weight change.  HENT: Negative for congestion, dental problem, ear pain, nosebleeds, postnasal drip, rhinorrhea, sinus pressure, sneezing, sore throat, trouble swallowing and voice change.   Eyes: Negative for visual disturbance.  Respiratory: Negative for cough, choking and shortness of breath.   Cardiovascular: Negative for chest pain and leg swelling.  Gastrointestinal: Negative for vomiting, abdominal pain and diarrhea.  Genitourinary: Negative for difficulty urinating.  Musculoskeletal: Negative for  arthralgias.  Skin: Negative for rash.  Neurological: Negative for tremors, syncope and headaches.  Hematological: Does not bruise/bleed easily.       Objective:   Physical Exam amb bf nad   Wt Readings from Last 3 Encounters:  10/24/15 173 lb 6.4 oz (78.654 kg)  10/04/15 170 lb (77.111 kg)  06/26/15 169 lb (76.658 kg)    Vital signs reviewed   HEENT: poor dentition, turbinates, and orophanx. Nl external ear canals without cough reflex   NECK :  without JVD/Nodes/TM/ nl carotid upstrokes bilaterally   LUNGS: no acc muscle use, clear to A and P bilaterally without cough on insp or exp maneuvers   CV:  RRR  no s3 or murmur or increase in P2, no edema   ABD:  Obese/  soft and nontender with nl excursion in the supine position. No bruits or organomegaly, bowel sounds nl  MS:  warm without deformities, calf tenderness, cyanosis or clubbing  SKIN: warm and dry without lesions    NEURO:  alert, approp, no deficits      I personally reviewed images and agree with radiology impression as follows:  CT chest  10/16/15  9 mm lingular nodule corresponding to the density on prior chest x-ray. Cannot exclude malignancy. Consider further evaluation with PET CT or close interval follow-up with repeat chest CT in 3-6 months.       Assessment & Plan:

## 2015-10-24 NOTE — Patient Instructions (Signed)
The key is to stop smoking completely before smoking completely stops you!   We will call you in 3 months to arrange a follow up CT - call sooner if new cough/ pain on breathing

## 2015-10-24 NOTE — Assessment & Plan Note (Addendum)
See 05/29/15 CXR  New from 02/09/14  See CT 10/16/15: 9 mm nodule  There is a new nodule in the lingula from one half years ago that could be residual from her recent acute respiratory infection but just as easily could be a bronchogenic carcinoma. Since it is so small the PET scan has a higher level of false positivity in this setting and can't really distinguish a resolving area of inflammation from a tumor anyway. The best that therefore is to followed up carefully, with a CT scan again in 3 months and moved directly to excisional biopsy if this is showing any growth at all. In the meantime she needs to make every effort to stop smoking.  Discussed in detail all the  indications, usual  risks and alternatives  relative to the benefits with patient who agrees to proceed with conservative f/u as outlined

## 2015-10-24 NOTE — Assessment & Plan Note (Signed)

## 2015-11-05 DIAGNOSIS — K6282 Dysplasia of anus: Secondary | ICD-10-CM | POA: Diagnosis not present

## 2015-11-11 DIAGNOSIS — M1812 Unilateral primary osteoarthritis of first carpometacarpal joint, left hand: Secondary | ICD-10-CM | POA: Diagnosis not present

## 2016-01-13 ENCOUNTER — Telehealth: Payer: Self-pay

## 2016-01-13 DIAGNOSIS — I10 Essential (primary) hypertension: Secondary | ICD-10-CM

## 2016-01-13 MED ORDER — LOSARTAN POTASSIUM-HCTZ 50-12.5 MG PO TABS: 1.0000 | ORAL_TABLET | Freq: Every day | ORAL | Status: DC

## 2016-01-14 ENCOUNTER — Other Ambulatory Visit: Payer: Self-pay | Admitting: Emergency Medicine

## 2016-01-14 DIAGNOSIS — I10 Essential (primary) hypertension: Secondary | ICD-10-CM

## 2016-01-14 MED ORDER — NIFEDIPINE ER OSMOTIC RELEASE 90 MG PO TB24: ORAL_TABLET | ORAL | Status: DC

## 2016-01-14 MED ORDER — LOSARTAN POTASSIUM-HCTZ 50-12.5 MG PO TABS: 1.0000 | ORAL_TABLET | Freq: Every day | ORAL | Status: DC

## 2016-01-21 NOTE — Telephone Encounter (Signed)
error 

## 2016-01-30 IMAGING — US US RENAL
1 series · 14 of 25 positions shown · non-contrast
Comparison: Prior abdominal radiographs [DATE];

CLINICAL DATA: 71-year-old female with stage 3 chronic kidney
disease

EXAM:
RENAL / URINARY TRACT ULTRASOUND COMPLETE

[Series 1: us renal · 0.20mm/px · 14 of 28 slices shown]
[im 1/28]
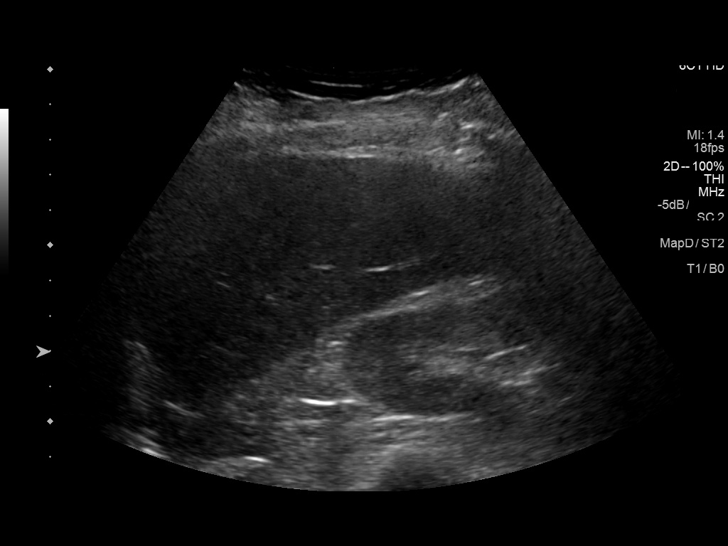
[im 3/28]
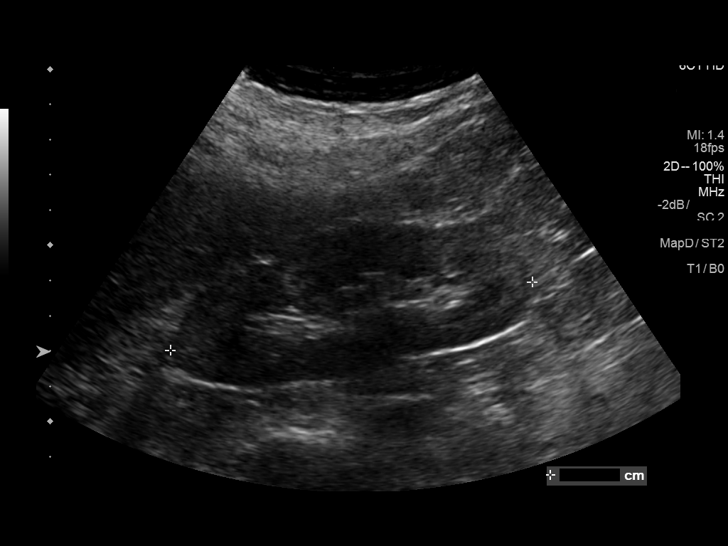
[im 5/28]
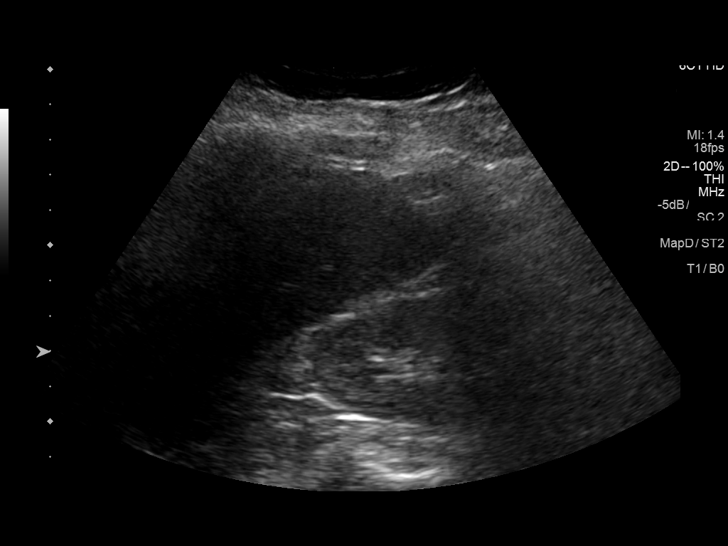
[im 7/28]
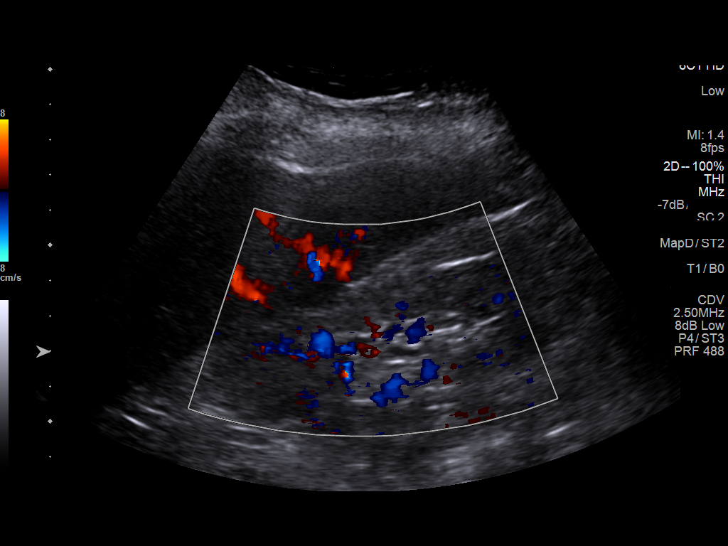
[im 10/28]
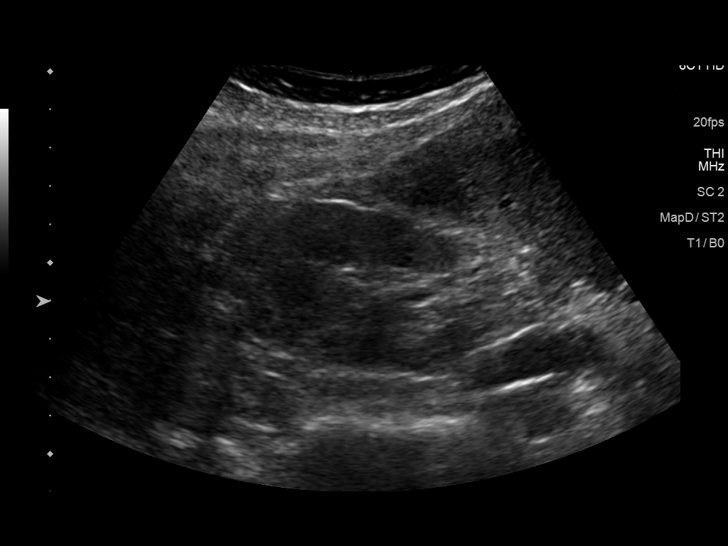
[im 11/28]
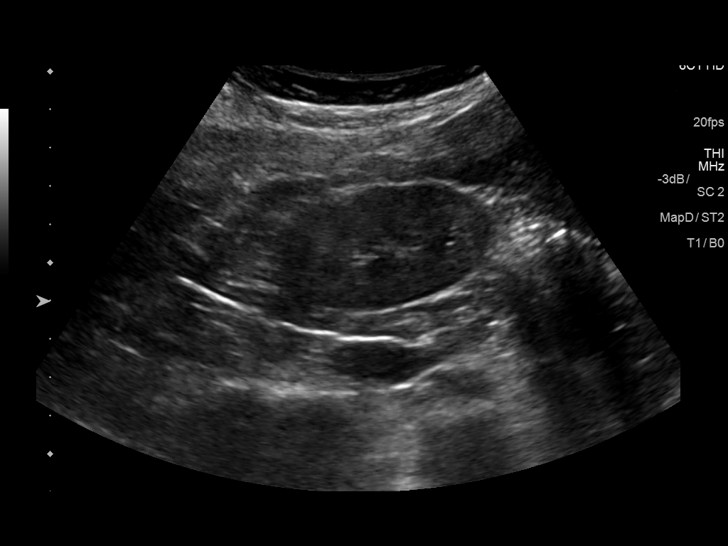
[im 13/28]
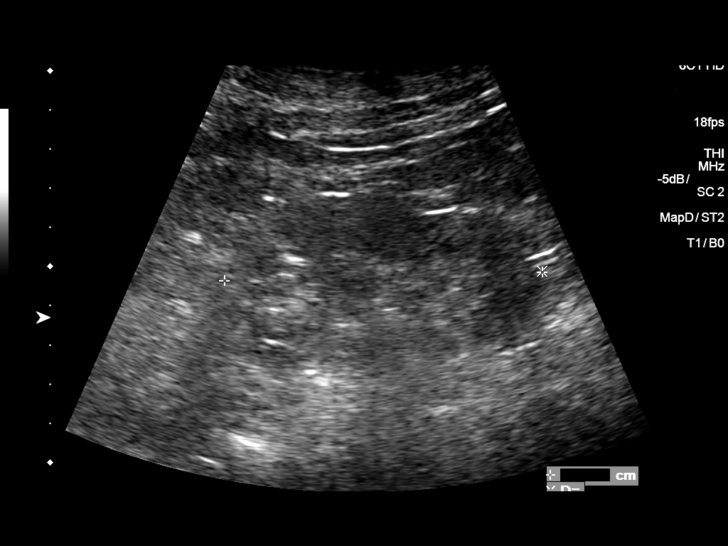
[im 15/28]
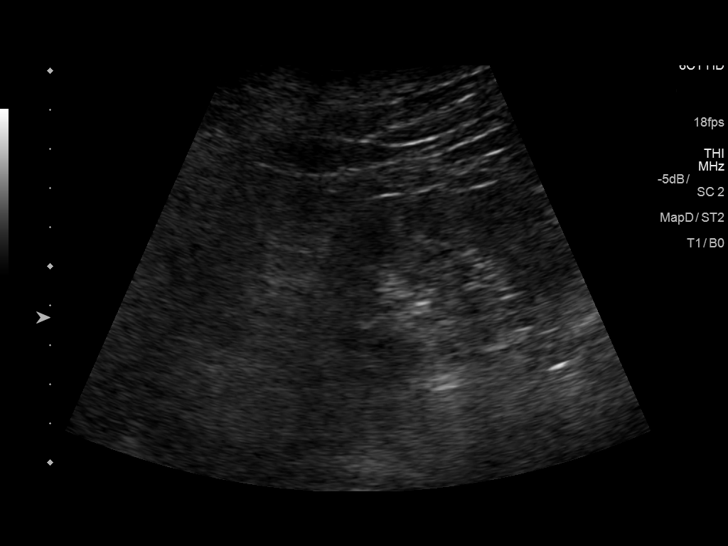
[im 17/28]
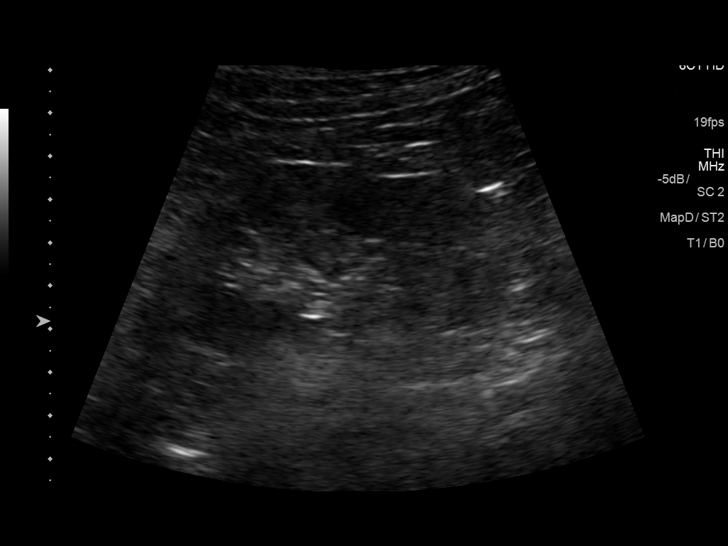
[im 19/28]
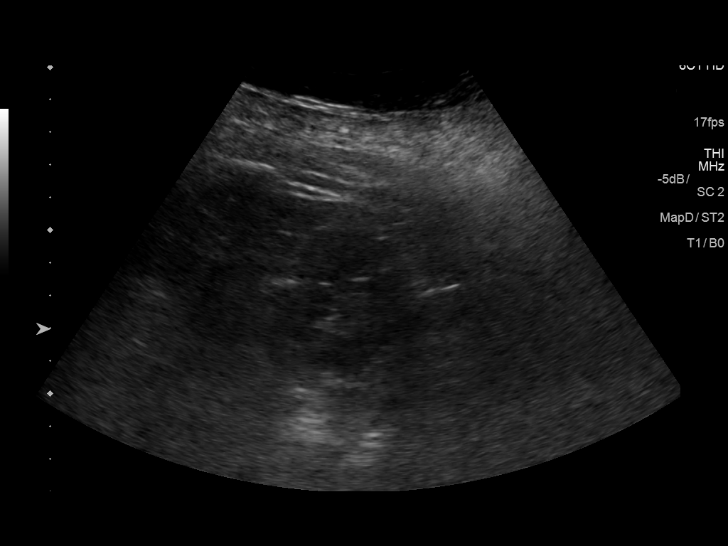
[im 21/28]
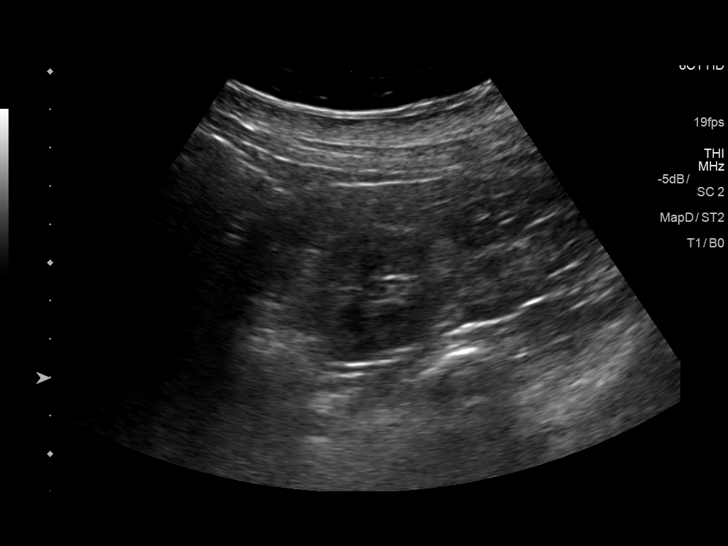
[im 23/28]
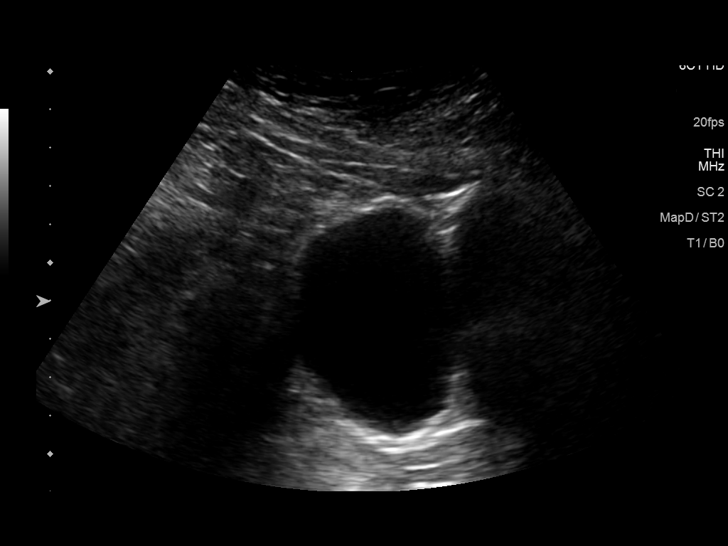
[im 25/28]
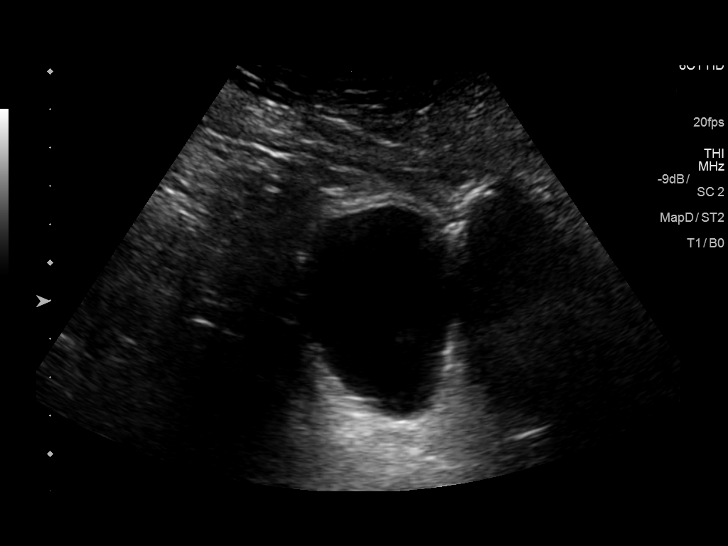
[im 28/28]
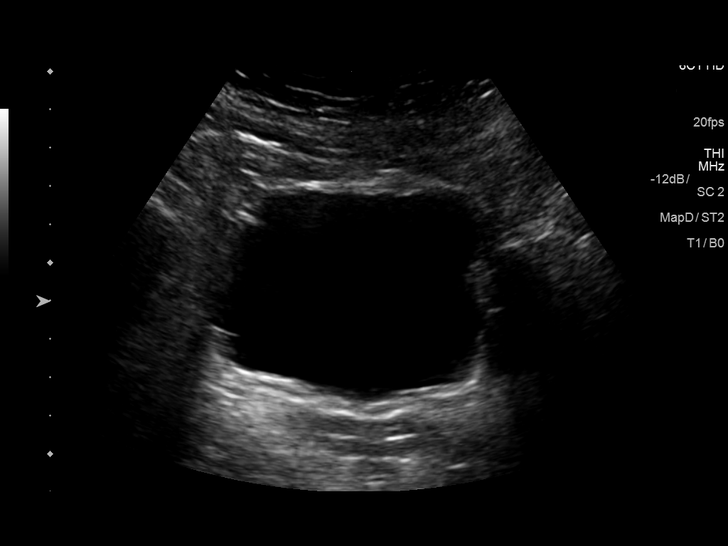

[14 of 25 positions shown; findings below may reference images not displayed]

FINDINGS: Right Kidney:

Length: 11.0 cm. Echogenicity within normal limits. No mass or
hydronephrosis visualized.

Left Kidney:

Length: 8.1 cm. Echogenicity within normal limits. No mass or
hydronephrosis visualized.

Bladder:

Appears normal for degree of bladder distention.
IMPRESSION: 1. Positive for renal size discrepancy with the left kidney being
significantly smaller than the right. This suggests the possibility
of chronic ischemia secondary to a chronic high-grade stenosis or
occlusion of the left renal artery.
2. No evidence of hydronephrosis, nephrolithiasis or other
significant abnormality.

## 2016-02-13 ENCOUNTER — Encounter: Payer: Self-pay | Admitting: Internal Medicine

## 2016-02-13 ENCOUNTER — Ambulatory Visit (INDEPENDENT_AMBULATORY_CARE_PROVIDER_SITE_OTHER): Payer: Medicare Other | Admitting: Internal Medicine

## 2016-02-13 VITALS — BP 138/68 | HR 73 | Temp 98.4°F | Resp 15 | Ht 62.0 in | Wt 172.2 lb

## 2016-02-13 DIAGNOSIS — J310 Chronic rhinitis: Secondary | ICD-10-CM

## 2016-02-13 MED ORDER — AMOXICILLIN 500 MG PO CAPS: 500.0000 mg | ORAL_CAPSULE | Freq: Three times a day (TID) | ORAL | Status: DC

## 2016-02-13 NOTE — Patient Instructions (Signed)
Plain Mucinex (NOT D) for thick secretions ;force NON dairy fluids .   Nasal cleansing as discussed with lather of mild shampoo.After 10 seconds wash off lather while  exhaling through nostrils. Make sure that all residual soap is removed to prevent irritation.  Flonase OR Nasacort AQ 1 spray in each nostril twice a day as needed. Use the "crossover" technique into opposite nostril spraying toward opposite ear @ 45 degree angle, not straight up into nostril.  Plain Allegra (NOT D )  160 daily , Loratidine 10 mg , OR Zyrtec 10 mg @ bedtime  as needed for itchy eyes & sneezing.  Fill the  prescription for antibiotic if fever, discolored nasal or chest secretions or significant pain above & below eyes appear in the next 48-72 hours.

## 2016-02-13 NOTE — Progress Notes (Signed)
Pre visit review using our clinic review tool, if applicable. No additional management support is needed unless otherwise documented below in the visit note. 

## 2016-02-13 NOTE — Progress Notes (Signed)
   Subjective:    Patient ID: Elaine Turner, female    DOB: 01/11/1946, 70 y.o.   MRN: [id number]  HPI  Her symptoms began last week with itchy, watery eyes. She's also had intermittent chills and marked rhinitis with clear drainage. She took leftover amoxicillin 500 mg one pill daily for 3 days. She's had minor cough with scant sputum.  She is decreasing her smoking and does desire to quit.  Review of Systems Frontal headache, facial pain , nasal purulence, dental pain, sore throat , otic pain or otic discharge denied. No fever or sweats.Extrinsic symptoms of  angioedema are denied. There is no significant  sputum production, wheezing,or  paroxysmal nocturnal dyspnea.    Objective:   Physical Exam She has faint arcus senilis. There is minimal erythema of the nasal mucosa. She has poor dentition with multiple missing teeth and tobacco staining. Initially there was slight irregularity to the heart rhythm; but this resolved on recheck. She had a grade 1/2 systolic murmur. Breath sounds are decreased.  General appearance:Adequately nourished; no acute distress or increased work of breathing is present.Appears younger than stated age(her birthday is today!)  Lymphatic: No  lymphadenopathy about the head, neck, or axilla . Eyes: No conjunctival inflammation or lid edema is present. There is no scleral icterus. Ears:  External ear exam shows no significant lesions or deformities.  Otoscopic examination reveals clear canals, tympanic membranes are intact bilaterally without bulging, retraction, inflammation or discharge. Nose:  External nasal examination shows no deformity or inflammation. No septal dislocation or deviation.No obstruction to airflow.  Oral exam:  lips and gums are healthy appearing.There is no oropharyngeal erythema or exudate . Neck:  No deformities, thyromegaly, masses, or tenderness noted.   Supple with full range of motion without pain.  Heart:  Normal rate . S1 and S2 normal  without gallop, click, rub or other extra sounds.  Lungs:Chest clear to auscultation; no wheezes, rhonchi,rales ,or rubs present. Extremities:  No cyanosis, edema, or clubbing  noted  Skin: Warm & dry w/o tenting.No significant lesions or rash.     Assessment & Plan:  #1 non allergic rhinitis #2 cough #3 smoker but in process of quitting See Orders & AVS

## 2016-03-25 ENCOUNTER — Ambulatory Visit (INDEPENDENT_AMBULATORY_CARE_PROVIDER_SITE_OTHER): Payer: Medicare Other | Admitting: Internal Medicine

## 2016-03-25 ENCOUNTER — Other Ambulatory Visit (INDEPENDENT_AMBULATORY_CARE_PROVIDER_SITE_OTHER): Payer: Medicare Other

## 2016-03-25 ENCOUNTER — Encounter: Payer: Self-pay | Admitting: Internal Medicine

## 2016-03-25 VITALS — BP 138/64 | HR 77 | Temp 97.7°F | Resp 20 | Wt 171.0 lb

## 2016-03-25 DIAGNOSIS — Z23 Encounter for immunization: Secondary | ICD-10-CM

## 2016-03-25 DIAGNOSIS — Z0000 Encounter for general adult medical examination without abnormal findings: Secondary | ICD-10-CM | POA: Diagnosis not present

## 2016-03-25 DIAGNOSIS — N182 Chronic kidney disease, stage 2 (mild): Secondary | ICD-10-CM | POA: Diagnosis not present

## 2016-03-25 DIAGNOSIS — I10 Essential (primary) hypertension: Secondary | ICD-10-CM

## 2016-03-25 DIAGNOSIS — R0989 Other specified symptoms and signs involving the circulatory and respiratory systems: Secondary | ICD-10-CM | POA: Diagnosis not present

## 2016-03-25 DIAGNOSIS — R911 Solitary pulmonary nodule: Secondary | ICD-10-CM

## 2016-03-25 DIAGNOSIS — R0689 Other abnormalities of breathing: Secondary | ICD-10-CM | POA: Insufficient documentation

## 2016-03-25 LAB — TSH: TSH: 0.72 u[IU]/mL (ref 0.35–4.50)

## 2016-03-25 LAB — HEPATIC FUNCTION PANEL
ALT: 10 U/L (ref 0–35)
AST: 13 U/L (ref 0–37)
Albumin: 4.5 g/dL (ref 3.5–5.2)
Alkaline Phosphatase: 80 U/L (ref 39–117)
Bilirubin, Direct: 0.1 mg/dL (ref 0.0–0.3)
Total Bilirubin: 0.3 mg/dL (ref 0.2–1.2)
Total Protein: 8.1 g/dL (ref 6.0–8.3)

## 2016-03-25 LAB — CBC WITH DIFFERENTIAL/PLATELET
Basophils Absolute: 0.1 10*3/uL (ref 0.0–0.1)
Basophils Relative: 0.6 % (ref 0.0–3.0)
Eosinophils Absolute: 0.4 10*3/uL (ref 0.0–0.7)
Eosinophils Relative: 2.9 % (ref 0.0–5.0)
HCT: 38.6 % (ref 36.0–46.0)
Hemoglobin: 12.3 g/dL (ref 12.0–15.0)
Lymphocytes Relative: 16.1 % (ref 12.0–46.0)
Lymphs Abs: 2.1 10*3/uL (ref 0.7–4.0)
MCHC: 31.9 g/dL (ref 30.0–36.0)
MCV: 76.9 fl — ABNORMAL LOW (ref 78.0–100.0)
Monocytes Absolute: 0.6 10*3/uL (ref 0.1–1.0)
Monocytes Relative: 4.4 % (ref 3.0–12.0)
Neutro Abs: 10.1 10*3/uL — ABNORMAL HIGH (ref 1.4–7.7)
Neutrophils Relative %: 76 % (ref 43.0–77.0)
Platelets: 456 10*3/uL — ABNORMAL HIGH (ref 150.0–400.0)
RBC: 5.03 Mil/uL (ref 3.87–5.11)
RDW: 17.1 % — ABNORMAL HIGH (ref 11.5–15.5)
WBC: 13.3 10*3/uL — ABNORMAL HIGH (ref 4.0–10.5)

## 2016-03-25 LAB — URINALYSIS, ROUTINE W REFLEX MICROSCOPIC
Bilirubin Urine: NEGATIVE
Ketones, ur: NEGATIVE
Leukocytes, UA: NEGATIVE
Nitrite: NEGATIVE
Specific Gravity, Urine: 1.015 (ref 1.000–1.030)
Urine Glucose: NEGATIVE
Urobilinogen, UA: 0.2 (ref 0.0–1.0)
pH: 6 (ref 5.0–8.0)

## 2016-03-25 LAB — BASIC METABOLIC PANEL
BUN: 31 mg/dL — ABNORMAL HIGH (ref 6–23)
CO2: 23 mEq/L (ref 19–32)
Calcium: 9.6 mg/dL (ref 8.4–10.5)
Chloride: 105 mEq/L (ref 96–112)
Creatinine, Ser: 1.42 mg/dL — ABNORMAL HIGH (ref 0.40–1.20)
GFR: 46.75 mL/min — ABNORMAL LOW (ref >60.00)
Glucose, Bld: 106 mg/dL — ABNORMAL HIGH (ref 70–99)
Potassium: 4 mEq/L (ref 3.5–5.1)
Sodium: 141 mEq/L (ref 135–145)

## 2016-03-25 LAB — LIPID PANEL
Cholesterol: 177 mg/dL (ref 0–200)
HDL: 49.3 mg/dL (ref >39.00)
LDL Cholesterol: 101 mg/dL — ABNORMAL HIGH (ref 0–99)
NonHDL: 128.16
Total CHOL/HDL Ratio: 4
Triglycerides: 138 mg/dL (ref 0.0–149.0)
VLDL: 27.6 mg/dL (ref 0.0–40.0)

## 2016-03-25 MED ORDER — LOSARTAN POTASSIUM 50 MG PO TABS: 50.0000 mg | ORAL_TABLET | Freq: Every day | ORAL | Status: DC

## 2016-03-25 MED ORDER — HYDROCHLOROTHIAZIDE 25 MG PO TABS: 25.0000 mg | ORAL_TABLET | Freq: Every day | ORAL | Status: DC

## 2016-03-25 MED ORDER — LOSARTAN POTASSIUM-HCTZ 50-12.5 MG PO TABS: 1.0000 | ORAL_TABLET | Freq: Every day | ORAL | Status: DC

## 2016-03-25 NOTE — Assessment & Plan Note (Addendum)
Due for f/u CT chest w/ CM , and refer back to Dr Melvyn Novas  In addition to the time spent performing CPE, I spent an additional 40 minutes face to face,in which greater than 50% of this time was spent in counseling and coordination of care for patient's acute illness as documented.

## 2016-03-25 NOTE — Assessment & Plan Note (Signed)

## 2016-03-25 NOTE — Patient Instructions (Addendum)
You had the Pneumovax shot today  OK to stop the losartan-HCT 50/12.5 mg  Please take all new medication as prescribed - the Losartan 50 mg per day (all by itself) AND the HCT (hydrochlorothiazide) 25 mg per day  Please continue all other medications as before, and refills have been done if requested.  Please have the pharmacy call with any other refills you may need.  Please continue your efforts at being more active, low cholesterol diet, and weight control.  You are otherwise up to date with prevention measures today.  Please keep your appointments with your specialists as you may have planned  You will be contacted regarding the referral for: CT scan chest, and Dr Minna Merritts  Please go to the LAB in the Basement (turn left off the elevator) for the tests to be done today  You will be contacted by phone if any changes need to be made immediately.  Otherwise, you will receive a letter about your results with an explanation, but please check with MyChart first.  Please remember to sign up for MyChart if you have not done so, as this will be important to you in the future with finding out test results, communicating by private email, and scheduling acute appointments online when needed.  Please return in 3 months, or sooner if needed

## 2016-03-25 NOTE — Progress Notes (Signed)
Subjective:    Patient ID: Elaine Turner, female    DOB: [birth year], 70 y.o.   MRN: [id number]  HPI  Here for wellness and f/u;  Overall doing ok;  Pt denies Chest pain, worsening SOB, DOE, wheezing, orthopnea, PND, palpitations, dizziness or syncope.  Pt denies neurological change such as new headache, facial or extremity weakness.  Pt denies polydipsia, polyuria, or low sugar symptoms. Pt states overall good compliance with treatment and medications, good tolerability, and has been trying to follow appropriate diet.  Pt denies worsening depressive symptoms, suicidal ideation or panic. No fever, night sweats, wt loss, loss of appetite, or other constitutional symptoms.  Pt states good ability with ADL's, has low fall risk, home safety reviewed and adequate, no other significant changes in hearing or vision, and only occasionally active with exercise.  Does have somewhat decresaed urinary volume though states she believes she drinks enough fluids.  Has mild bilat LE edema today of which she is really unaware.  Has known hx of CKD Wt Readings from Last 3 Encounters:  03/25/16 171 lb (77.565 kg)  02/13/16 172 lb 4 oz (78.132 kg)  10/24/15 173 lb 6.4 oz (78.654 kg)  Also has hx of solitary pulm nodule, seen per Dr Melvyn Novas in oct 2016, asked pt to return for repeat CT and possible PET at 3 mo but pt states she does not recall this.  Wt and appetite seem ok. Past Medical History  Diagnosis Date  . Hypertension   . PVD (peripheral vascular disease) ([place])   . Arthritis   . Heart murmur   . Urine incontinence   . Pneumonia 05/01/2013  . Chronic diarrhea   . Hyperglycemia 05/01/2013  . Hemorrhoids   . COPD  GOLD 0 / active smoker  10/24/2015   Past Surgical History  Procedure Laterality Date  . Abdominal hysterectomy    . Hemorrhoid surgery N/A 06/26/2015    Procedure: INTERNAL AND EXTERNAL HEMORRHOIDECTOMY;  Surgeon: Georganna Skeans, MD;  Location: [facility] [place];  Service: General;   Laterality: N/A;    reports that she has been smoking Cigarettes.  She has a 87 pack-year smoking history. She has never used smokeless tobacco. She reports that she drinks about 0.6 oz of alcohol per week. She reports that she does not use illicit drugs. family history is not on file. Allergies  Allergen Reactions  . Ace Inhibitors Cough   Current Outpatient Prescriptions on File Prior to Visit  Medication Sig Dispense Refill  . NIFEdipine (PROCARDIA XL/ADALAT-CC) 90 MG 24 hr tablet TAKE 1 TABLET (90 MG TOTAL) BY MOUTH DAILY. 90 tablet 0   No current facility-administered medications on file prior to visit.   Review of Systems Constitutional: Negative for increased diaphoresis, or other activity, appetite or siginficant weight change other than noted HENT: Negative for worsening hearing loss, ear pain, facial swelling, mouth sores and neck stiffness.   Eyes: Negative for other worsening pain, redness or visual disturbance.  Respiratory: Negative for choking or stridor Cardiovascular: Negative for other chest pain and palpitations.  Gastrointestinal: Negative for worsening diarrhea, blood in stool, or abdominal distention Genitourinary: Negative for hematuria, flank pain or change in urine volume.  Musculoskeletal: Negative for myalgias or other joint complaints.  Skin: Negative for other color change and wound or drainage.  Neurological: Negative for syncope and numbness. other than noted Hematological: Negative for adenopathy. or other swelling Psychiatric/Behavioral: Negative for hallucinations, SI, self-injury, decreased concentration or other worsening agitation.  Objective:   Physical Exam BP 138/64 mmHg  Pulse 77  Temp(Src) 97.7 F (36.5 C) (Oral)  Resp 20  Wt 171 lb (77.565 kg)  SpO2 93% VS noted,  Constitutional: Pt is oriented to person, place, and time. Appears well-developed and well-nourished, in no significant distress Head: Normocephalic and atraumatic    Eyes: Conjunctivae and EOM are normal. Pupils are equal, round, and reactive to light Right Ear: External ear normal.  Left Ear: External ear normal Nose: Nose normal.  Mouth/Throat: Oropharynx is clear and moist  Neck: Normal range of motion. Neck supple. No JVD present. No tracheal deviation present or significant neck LA or mass Cardiovascular: Normal rate, regular rhythm, normal heart sounds and intact distal pulses.   Pulmonary/Chest: Effort normal and breath sounds with ? bibas dry vs other rales, no wheezing  Abdominal: Soft. Bowel sounds are normal. NT. No HSM  Musculoskeletal: Normal range of motion. Exhibits trace bilat LE edema to mid legs Lymphadenopathy: Has no cervical adenopathy.  Neurological: Pt is alert and oriented to person, place, and time. Pt has normal reflexes. No cranial nerve deficit. Motor grossly intact Skin: Skin is warm and dry. No rash noted or new ulcers Psychiatric:  Has normal mood and affect. Behavior is normal.   Most recent echo 2013 -  Transthoracic Echocardiography  Patient:  Elaine Turner, Elaine Turner MR #:    [id number] Study Date: 09/21/2012 Gender:   F Age:    49 Height:   157.5cm Weight:   80.7kg BSA:    1.28m^2 Pt. Status: Room:  ATTENDING  [facility] REFERRING  Biagio Borg PERFORMING  Zacarias Pontes, [facility] SONOGRAPHER Victorio Palm, RDCS cc:  ------------------------------------------------------------ LV EF: 70%  ------------------------------------------------------------ Indications:   Murmur 785.2.  ------------------------------------------------------------ History:  PMH: PVD. Acquired from the patient and from the patient's chart. Systolic murmur. Risk factors: Current tobacco use. Hypertension. Obese. Dyslipidemia.  ------------------------------------------------------------ Study Conclusions  - Left ventricle: Technically difficult study There  is vigorous LV motion. There is no SAM of the mitral valve and no LVOT gradient. The cavity size was normal. Wall thickness was increased in a pattern of mild LVH. The estimated ejection fraction was 70%. Wall motion was normal; there were no regional wall motion abnormalities. Doppler parameters are consistent with abnormal left ventricular relaxation (grade 1 diastolic dysfunction). - Aortic valve: Sclerosis without stenosis. No significant regurgitation. - Right ventricle: The cavity size was normal. Systolic function was normal.  Lab Results  Component Value Date   WBC 12.6* 10/04/2015   HGB 12.8 10/04/2015   HCT 40.1 10/04/2015   PLT 458.0* 10/04/2015   GLUCOSE 98 10/04/2015   CHOL 166 04/13/2014   TRIG 137.0 04/13/2014   HDL 41.40 04/13/2014   LDLCALC 97 04/13/2014   ALT 16 04/13/2014   AST 17 04/13/2014   NA 138 10/04/2015   K 3.7 10/04/2015   CL 102 10/04/2015   CREATININE 1.35* 10/04/2015   BUN 25* 10/04/2015   CO2 27 10/04/2015   TSH 0.52 05/16/2015   HGBA1C 5.9 04/13/2014       Assessment & Plan:

## 2016-03-25 NOTE — Assessment & Plan Note (Signed)
With some edema, ? CKD related vs diast dysfxn, for change losHCT to losartan 50 and HCT 25 qd, for f/u labs today, f/u BP at home and next visit BP Readings from Last 3 Encounters:  03/25/16 138/64  02/13/16 138/68  10/24/15 144/66

## 2016-03-25 NOTE — Progress Notes (Signed)
Pre visit review using our clinic review tool, if applicable. No additional management support is needed unless otherwise documented below in the visit note. 

## 2016-03-25 NOTE — Assessment & Plan Note (Signed)
For f/u lab, ? Progression to stage 3? May benefit more from lasix than hct if this occurs

## 2016-03-25 NOTE — Assessment & Plan Note (Signed)
?   Dry rales, vs other, will assess with CT as well as above

## 2016-03-31 ENCOUNTER — Inpatient Hospital Stay: Admission: RE | Admit: 2016-03-31 | Payer: Medicare Other | Source: Ambulatory Visit

## 2016-04-23 ENCOUNTER — Other Ambulatory Visit: Payer: Self-pay | Admitting: *Deleted

## 2016-04-23 DIAGNOSIS — I10 Essential (primary) hypertension: Secondary | ICD-10-CM

## 2016-04-23 MED ORDER — NIFEDIPINE ER OSMOTIC RELEASE 90 MG PO TB24: ORAL_TABLET | ORAL | Status: DC

## 2016-08-19 DIAGNOSIS — H25811 Combined forms of age-related cataract, right eye: Secondary | ICD-10-CM | POA: Diagnosis not present

## 2016-08-19 DIAGNOSIS — Z961 Presence of intraocular lens: Secondary | ICD-10-CM | POA: Diagnosis not present

## 2016-08-19 DIAGNOSIS — H2511 Age-related nuclear cataract, right eye: Secondary | ICD-10-CM | POA: Diagnosis not present

## 2016-08-27 DIAGNOSIS — H268 Other specified cataract: Secondary | ICD-10-CM | POA: Diagnosis not present

## 2016-08-27 DIAGNOSIS — H25011 Cortical age-related cataract, right eye: Secondary | ICD-10-CM | POA: Diagnosis not present

## 2016-08-27 DIAGNOSIS — H2511 Age-related nuclear cataract, right eye: Secondary | ICD-10-CM | POA: Diagnosis not present

## 2017-03-19 ENCOUNTER — Other Ambulatory Visit (INDEPENDENT_AMBULATORY_CARE_PROVIDER_SITE_OTHER): Payer: Medicare Other

## 2017-03-19 ENCOUNTER — Other Ambulatory Visit: Payer: Self-pay | Admitting: Internal Medicine

## 2017-03-19 ENCOUNTER — Encounter: Payer: Self-pay | Admitting: Internal Medicine

## 2017-03-19 ENCOUNTER — Ambulatory Visit (INDEPENDENT_AMBULATORY_CARE_PROVIDER_SITE_OTHER): Payer: Medicare Other | Admitting: Internal Medicine

## 2017-03-19 VITALS — BP 145/80 | Temp 98.2°F | Ht 62.0 in | Wt 163.0 lb

## 2017-03-19 DIAGNOSIS — N182 Chronic kidney disease, stage 2 (mild): Secondary | ICD-10-CM | POA: Diagnosis not present

## 2017-03-19 DIAGNOSIS — E2839 Other primary ovarian failure: Secondary | ICD-10-CM | POA: Diagnosis not present

## 2017-03-19 DIAGNOSIS — L918 Other hypertrophic disorders of the skin: Secondary | ICD-10-CM | POA: Diagnosis not present

## 2017-03-19 DIAGNOSIS — N183 Chronic kidney disease, stage 3 unspecified: Secondary | ICD-10-CM

## 2017-03-19 DIAGNOSIS — Z0000 Encounter for general adult medical examination without abnormal findings: Secondary | ICD-10-CM | POA: Diagnosis not present

## 2017-03-19 DIAGNOSIS — I10 Essential (primary) hypertension: Secondary | ICD-10-CM | POA: Diagnosis not present

## 2017-03-19 DIAGNOSIS — Z1159 Encounter for screening for other viral diseases: Secondary | ICD-10-CM | POA: Diagnosis not present

## 2017-03-19 LAB — CBC WITH DIFFERENTIAL/PLATELET
Basophils Absolute: 0.1 10*3/uL (ref 0.0–0.1)
Basophils Relative: 0.6 % (ref 0.0–3.0)
Eosinophils Absolute: 0.4 10*3/uL (ref 0.0–0.7)
Eosinophils Relative: 3.2 % (ref 0.0–5.0)
HCT: 39 % (ref 36.0–46.0)
Hemoglobin: 12.2 g/dL (ref 12.0–15.0)
Lymphocytes Relative: 20.8 % (ref 12.0–46.0)
Lymphs Abs: 2.5 10*3/uL (ref 0.7–4.0)
MCHC: 31.3 g/dL (ref 30.0–36.0)
MCV: 76.2 fl — ABNORMAL LOW (ref 78.0–100.0)
Monocytes Absolute: 0.8 10*3/uL (ref 0.1–1.0)
Monocytes Relative: 6.6 % (ref 3.0–12.0)
Neutro Abs: 8.1 10*3/uL — ABNORMAL HIGH (ref 1.4–7.7)
Neutrophils Relative %: 68.8 % (ref 43.0–77.0)
Platelets: 425 10*3/uL — ABNORMAL HIGH (ref 150.0–400.0)
RBC: 5.11 Mil/uL (ref 3.87–5.11)
RDW: 18.5 % — ABNORMAL HIGH (ref 11.5–15.5)
WBC: 11.8 10*3/uL — ABNORMAL HIGH (ref 4.0–10.5)

## 2017-03-19 LAB — URINALYSIS, ROUTINE W REFLEX MICROSCOPIC
Bilirubin Urine: NEGATIVE
Hgb urine dipstick: NEGATIVE
Ketones, ur: NEGATIVE
Leukocytes, UA: NEGATIVE
Nitrite: NEGATIVE
Specific Gravity, Urine: >=1.03 — AB (ref 1.000–1.030)
Total Protein, Urine: 100 — AB
Urine Glucose: NEGATIVE
Urobilinogen, UA: 0.2 (ref 0.0–1.0)
pH: 5.5 (ref 5.0–8.0)

## 2017-03-19 LAB — LIPID PANEL
Cholesterol: 170 mg/dL (ref 0–200)
HDL: 48.4 mg/dL (ref >39.00)
LDL Cholesterol: 102 mg/dL — ABNORMAL HIGH (ref 0–99)
NonHDL: 121.11
Total CHOL/HDL Ratio: 4
Triglycerides: 98 mg/dL (ref 0.0–149.0)
VLDL: 19.6 mg/dL (ref 0.0–40.0)

## 2017-03-19 LAB — HEPATIC FUNCTION PANEL
ALT: 12 U/L (ref 0–35)
AST: 14 U/L (ref 0–37)
Albumin: 4 g/dL (ref 3.5–5.2)
Alkaline Phosphatase: 72 U/L (ref 39–117)
Bilirubin, Direct: 0.1 mg/dL (ref 0.0–0.3)
Total Bilirubin: 0.3 mg/dL (ref 0.2–1.2)
Total Protein: 7 g/dL (ref 6.0–8.3)

## 2017-03-19 LAB — BASIC METABOLIC PANEL
BUN: 28 mg/dL — ABNORMAL HIGH (ref 6–23)
CO2: 24 mEq/L (ref 19–32)
Calcium: 9.2 mg/dL (ref 8.4–10.5)
Chloride: 106 mEq/L (ref 96–112)
Creatinine, Ser: 1.42 mg/dL — ABNORMAL HIGH (ref 0.40–1.20)
GFR: 46.62 mL/min — ABNORMAL LOW (ref >60.00)
Glucose, Bld: 94 mg/dL (ref 70–99)
Potassium: 3.7 mEq/L (ref 3.5–5.1)
Sodium: 140 mEq/L (ref 135–145)

## 2017-03-19 LAB — TSH: TSH: 0.7 u[IU]/mL (ref 0.35–4.50)

## 2017-03-19 MED ORDER — HYDROCHLOROTHIAZIDE 25 MG PO TABS
25.0000 mg | ORAL_TABLET | Freq: Every day | ORAL | 3 refills | Status: DC
Start: 2017-03-19 — End: 2018-04-10

## 2017-03-19 MED ORDER — NIFEDIPINE ER OSMOTIC RELEASE 90 MG PO TB24: ORAL_TABLET | ORAL | 3 refills | Status: DC

## 2017-03-19 MED ORDER — LOSARTAN POTASSIUM 50 MG PO TABS: 50.0000 mg | ORAL_TABLET | Freq: Every day | ORAL | 3 refills | Status: DC

## 2017-03-19 MED ORDER — ASPIRIN 81 MG PO TBEC: 81.0000 mg | DELAYED_RELEASE_TABLET | Freq: Every day | ORAL | 12 refills | Status: DC

## 2017-03-19 NOTE — Patient Instructions (Addendum)
Please start Aspirin 81 mg per day (enteric coated - OTC)  Please continue and restart all other medications as before, and refills have been done for all 3 medications  Please have the pharmacy call with any other refills you may need.  Please continue your efforts at being more active, low cholesterol diet, and weight control.  Please schedule the bone density test before leaving today at the scheduling desk (where you check out)  You are otherwise up to date with prevention measures today.  You will be contacted regarding the referral for: dermatology  Please keep your appointments with your specialists as you may have planned  Please go to the LAB in the Basement (turn left off the elevator) for the tests to be done today  You will be contacted by phone if any changes need to be made immediately.  Otherwise, you will receive a letter about your results with an explanation, but please check with MyChart first.  Please remember to sign up for MyChart if you have not done so, as this will be important to you in the future with finding out test results, communicating by private email, and scheduling acute appointments online when needed.  Please return in 6 months, or sooner if needed, with Lab testing done 3-5 days before

## 2017-03-19 NOTE — Progress Notes (Signed)
Pre visit review using our clinic review tool, if applicable. No additional management support is needed unless otherwise documented below in the visit note. 

## 2017-03-19 NOTE — Progress Notes (Signed)
Subjective:    Patient ID: Elaine Turner, female    DOB: 1946/06/27, 71 y.o.   MRN: [id number]  HPI  Here for wellness and f/u;  Overall doing ok;  Pt denies Chest pain, worsening SOB, DOE, wheezing, orthopnea, PND, worsening LE edema, palpitations, dizziness or syncope.  Pt denies neurological change such as new headache, facial or extremity weakness.  Pt denies polydipsia, polyuria, or low sugar symptoms. Pt states overall good compliance with treatment and medications, good tolerability, and has been trying to follow appropriate diet.  Pt denies worsening depressive symptoms, suicidal ideation or panic. No fever, night sweats, wt loss, loss of appetite, or other constitutional symptoms.  Pt states good ability with ADL's, has low fall risk, home safety reviewed and adequate, no other significant changes in hearing or vision, and only occasionally active with exercise. No other hx changes. Only taking 2 pills for bp since ran out, not sure which.  Has several skin tags to face she would like removed.   Past Medical History:  Diagnosis Date  . Arthritis   . Chronic diarrhea   . COPD  GOLD 0 / active smoker  10/24/2015  . Heart murmur   . Hemorrhoids   . Hyperglycemia 05/01/2013  . Hypertension   . Pneumonia 05/01/2013  . PVD (peripheral vascular disease) ([place])   . Urine incontinence    Past Surgical History:  Procedure Laterality Date  . ABDOMINAL HYSTERECTOMY    . HEMORRHOID SURGERY N/A 06/26/2015   Procedure: INTERNAL AND EXTERNAL HEMORRHOIDECTOMY;  Surgeon: Georganna Skeans, MD;  Location: [facility];  Service: General;  Laterality: N/A;    reports that she has been smoking Cigarettes.  She has a 87.00 pack-year smoking history. She has never used smokeless tobacco. She reports that she drinks about 0.6 oz of alcohol per week . She reports that she does not use drugs. family history is not on file. Allergies  Allergen Reactions  . Ace Inhibitors Cough   No current  outpatient prescriptions on file prior to visit.   No current facility-administered medications on file prior to visit.    Review of Systems Constitutional: Negative for increased diaphoresis, or other activity, appetite or siginficant weight change other than noted HENT: Negative for worsening hearing loss, ear pain, facial swelling, mouth sores and neck stiffness.   Eyes: Negative for other worsening pain, redness or visual disturbance.  Respiratory: Negative for choking or stridor Cardiovascular: Negative for other chest pain and palpitations.  Gastrointestinal: Negative for worsening diarrhea, blood in stool, or abdominal distention Genitourinary: Negative for hematuria, flank pain or change in urine volume.  Musculoskeletal: Negative for myalgias or other joint complaints.  Skin: Negative for other color change and wound or drainage.  Neurological: Negative for syncope and numbness. other than noted Hematological: Negative for adenopathy. or other swelling Psychiatric/Behavioral: Negative for hallucinations, SI, self-injury, decreased concentration or other worsening agitation.  All other system neg per pt    Objective:   Physical Exam BP (!) 145/80   Temp 98.2 F (36.8 C) (Oral)   Ht 5\' 2"  (1.575 m)   Wt 163 lb (73.9 kg)   SpO2 98%   BMI 29.81 kg/m  VS noted,  Constitutional: Pt is oriented to person, place, and time. Appears well-developed and well-nourished, in no significant distress Head: Normocephalic and atraumatic  Eyes: Conjunctivae and EOM are normal. Pupils are equal, round, and reactive to light Right Ear: External ear normal.  Left Ear: External ear normal Nose:  Nose normal.  Mouth/Throat: Oropharynx is clear and moist  Neck: Normal range of motion. Neck supple. No JVD present. No tracheal deviation present or significant neck LA or mass Cardiovascular: Normal rate, regular rhythm, normal heart sounds and intact distal pulses.   Pulmonary/Chest: Effort normal  and breath sounds without rales or wheezing  Abdominal: Soft. Bowel sounds are normal. NT. No HSM  Musculoskeletal: Normal range of motion. Exhibits no edema Lymphadenopathy: Has no cervical adenopathy.  Neurological: Pt is alert and oriented to person, place, and time. Pt has normal reflexes. No cranial nerve deficit. Motor grossly intact Skin: Skin is warm and dry. No rash noted or new ulcers, large skin tag to right face Psychiatric:  Has normal mood and affect. Behavior is normal.  No other exam findings    Assessment & Plan:

## 2017-03-20 NOTE — Assessment & Plan Note (Signed)

## 2017-03-20 NOTE — Assessment & Plan Note (Signed)
To restart all meds, start asa 81 qd as well

## 2017-03-20 NOTE — Assessment & Plan Note (Signed)
Large right face, for derm referral

## 2017-03-20 NOTE — Assessment & Plan Note (Signed)
For f/u lab today, consider renal referral 

## 2017-05-17 DIAGNOSIS — Z720 Tobacco use: Secondary | ICD-10-CM | POA: Diagnosis not present

## 2017-05-17 DIAGNOSIS — N183 Chronic kidney disease, stage 3 (moderate): Secondary | ICD-10-CM | POA: Diagnosis not present

## 2017-05-18 DIAGNOSIS — N390 Urinary tract infection, site not specified: Secondary | ICD-10-CM | POA: Diagnosis not present

## 2017-05-26 ENCOUNTER — Other Ambulatory Visit: Payer: Self-pay | Admitting: Nephrology

## 2017-05-26 DIAGNOSIS — N183 Chronic kidney disease, stage 3 unspecified: Secondary | ICD-10-CM

## 2017-06-09 ENCOUNTER — Other Ambulatory Visit: Payer: Medicare Other

## 2017-06-14 ENCOUNTER — Ambulatory Visit
Admission: RE | Admit: 2017-06-14 | Discharge: 2017-06-14 | Disposition: A | Discharge status: Home / Self Care | Payer: Medicare Other | Source: Ambulatory Visit | Attending: Nephrology | Admitting: Nephrology

## 2017-06-14 DIAGNOSIS — N183 Chronic kidney disease, stage 3 unspecified: Secondary | ICD-10-CM

## 2017-06-17 ENCOUNTER — Encounter: Payer: Self-pay | Admitting: Internal Medicine

## 2017-06-17 ENCOUNTER — Ambulatory Visit (INDEPENDENT_AMBULATORY_CARE_PROVIDER_SITE_OTHER): Payer: Medicare Other | Admitting: Internal Medicine

## 2017-06-17 VITALS — BP 138/64 | HR 89 | Temp 97.9°F | Resp 16 | Wt 159.0 lb

## 2017-06-17 DIAGNOSIS — R634 Abnormal weight loss: Secondary | ICD-10-CM | POA: Diagnosis not present

## 2017-06-17 DIAGNOSIS — K5900 Constipation, unspecified: Secondary | ICD-10-CM | POA: Insufficient documentation

## 2017-06-17 NOTE — Assessment & Plan Note (Signed)
>   10 lbs since having dentures - she is eating very little due to food not tasting good - tastes like plastic and difficulty chewing Advised eating softer foods, try cutting meat of very small so less chewing is needed Revision of dentures is not possible Try ensure, boost - samples given If weight loss continues she will need to return for further evaluation of other causes, but given significant decrease in oral intake since dentures that is likely the cause

## 2017-06-17 NOTE — Progress Notes (Signed)
Subjective:    Patient ID: Elaine Turner, female    DOB: 1946-05-29, 71 y.o.   MRN: [id number]  HPI She is here for an acute visit.   Constipation: She has a bowel movement every other day. Her stools are small and soft.  She feels it is not a normal bowel movement and feels constipated.  She got dentures last November and has not been eating as much then.  Yesterday she only ate a chicken leg - that is it.  She does not enjoy eating as much and has a decreased appetite.  The dentures taste like plastic and food does not taste as good.  She knows she should be eating more.    She denies fever, chills, sweats, abdominal pain, blood in the stool, nausea. Last colonoscopy was 04/2013 and it was normal.    Medications and allergies reviewed with patient and updated if appropriate.  Patient Active Problem List   Diagnosis Date Noted  . Skin tag 03/19/2017  . Abnormal breath sounds 03/25/2016  . COPD  GOLD 0 / active smoker  10/24/2015  . Solitary pulmonary nodule 05/31/2015  . Impaired glucose tolerance 04/13/2014  . Left knee pain 02/09/2014  . Primary localized osteoarthrosis, lower leg 02/09/2014  . Anemia, unspecified 05/15/2013  . Hemorrhoids 05/02/2013  . CKD (chronic kidney disease), stage II 05/01/2013  . Hyperglycemia 05/01/2013  . Atherosclerosis of native arteries of the extremities with intermittent claudication 10/14/2012  . Osteoarthritis 09/08/2012  . Cigarette smoker 09/08/2012  . Insomnia 09/08/2012  . Hyperlipidemia 09/08/2012  . Heart murmur, aortic 09/08/2012  . Preventative health care 09/02/2012  . Hypertension   . PVD (peripheral vascular disease) ([place])     Current Outpatient Prescriptions on File Prior to Visit  Medication Sig Dispense Refill  . aspirin 81 MG EC tablet Take 1 tablet (81 mg total) by mouth daily. Swallow whole. 30 tablet 12  . hydrochlorothiazide (HYDRODIURIL) 25 MG tablet Take 1 tablet (25 mg total) by mouth daily. 90 tablet 3  .  losartan (COZAAR) 50 MG tablet Take 1 tablet (50 mg total) by mouth daily. 90 tablet 3  . NIFEdipine (PROCARDIA XL/ADALAT-CC) 90 MG 24 hr tablet TAKE 1 TABLET (90 MG TOTAL) BY MOUTH DAILY. 90 tablet 3   No current facility-administered medications on file prior to visit.     Past Medical History:  Diagnosis Date  . Arthritis   . Chronic diarrhea   . COPD  GOLD 0 / active smoker  10/24/2015  . Heart murmur   . Hemorrhoids   . Hyperglycemia 05/01/2013  . Hypertension   . Pneumonia 05/01/2013  . PVD (peripheral vascular disease) ([place])   . Urine incontinence     Past Surgical History:  Procedure Laterality Date  . ABDOMINAL HYSTERECTOMY    . HEMORRHOID SURGERY N/A 06/26/2015   Procedure: INTERNAL AND EXTERNAL HEMORRHOIDECTOMY;  Surgeon: Georganna Skeans, MD;  Location: [facility];  Service: General;  Laterality: N/A;    Social History   Social History  . Marital status: Married    Spouse name: N/A  . Number of children: N/A  . Years of education: 54   Occupational History  . retired    Social History Main Topics  . Smoking status: Current Every Day Smoker    Packs/day: 1.50    Years: 58.00    Types: Cigarettes  . Smokeless tobacco: Never Used  . Alcohol use 0.6 oz/week    1 Glasses of wine per  week     Comment: occassional  . Drug use: No  . Sexual activity: No   Other Topics Concern  . Not on file   Social History Narrative  . No narrative on file    No family history on file.  Review of Systems  Constitutional: Negative for chills, diaphoresis and fever.  Respiratory: Negative for cough, shortness of breath and wheezing.   Cardiovascular: Negative for chest pain, palpitations and leg swelling.  Gastrointestinal: Positive for constipation. Negative for abdominal pain, blood in stool, diarrhea and nausea.  Neurological: Negative for light-headedness and headaches.       Objective:   Vitals:   06/17/17 1036  BP: 138/64  Pulse: 89  Resp: 16   Temp: 97.9 F (36.6 C)   Filed Weights   06/17/17 1036  Weight: 159 lb (72.1 kg)   Body mass index is 29.08 kg/m.  Wt Readings from Last 3 Encounters:  06/17/17 159 lb (72.1 kg)  03/19/17 163 lb (73.9 kg)  03/25/16 171 lb (77.6 kg)     Physical Exam Constitutional: Appears well-developed and well-nourished. No distress.  HENT:  Head: Normocephalic and atraumatic.  Neck: Neck supple. No tracheal deviation present. No thyromegaly present.  No cervical lymphadenopathy Cardiovascular: Normal rate, regular rhythm and normal heart sounds.   No murmur heard. No carotid bruit .  No edema Pulmonary/Chest: Effort normal and breath sounds normal. No respiratory distress. No has no wheezes. No rales.  Abdomen: soft, obese, ventral hernia, non tender, non distended Skin: Skin is warm and dry. Not diaphoretic.  Psychiatric: Normal mood and affect. Behavior is normal.       Assessment & Plan:   See Problem List for Assessment and Plan of chronic medical problems.

## 2017-06-17 NOTE — Patient Instructions (Addendum)
Call [facility] dermatology for an appointment.  They should have an active referral.   Try drinking ensure or boost for meal supplementation.   Try cutting your meal into very small pieces.  Eat more soft foods.     If you continue to lose weight please follow up.

## 2017-06-17 NOTE — Assessment & Plan Note (Signed)
Related to poor PO intake likely, no other concerning symptoms Colonoscopy 4 years ago was normal I would expect if her oral intake improves her constipation/ bowel movements will improve

## 2017-08-09 ENCOUNTER — Ambulatory Visit (INDEPENDENT_AMBULATORY_CARE_PROVIDER_SITE_OTHER): Payer: Medicare Other | Admitting: Family Medicine

## 2017-08-09 ENCOUNTER — Encounter: Payer: Self-pay | Admitting: Family Medicine

## 2017-08-09 VITALS — BP 160/78 | HR 80 | Temp 98.0°F | Ht 62.0 in | Wt 158.0 lb

## 2017-08-09 DIAGNOSIS — R05 Cough: Secondary | ICD-10-CM | POA: Diagnosis not present

## 2017-08-09 DIAGNOSIS — R051 Acute cough: Secondary | ICD-10-CM | POA: Insufficient documentation

## 2017-08-09 DIAGNOSIS — R059 Cough, unspecified: Secondary | ICD-10-CM | POA: Insufficient documentation

## 2017-08-09 DIAGNOSIS — F17210 Nicotine dependence, cigarettes, uncomplicated: Secondary | ICD-10-CM

## 2017-08-09 MED ORDER — BENZONATATE 100 MG PO CAPS: 100.0000 mg | ORAL_CAPSULE | Freq: Two times a day (BID) | ORAL | 0 refills | Status: DC | PRN

## 2017-08-09 NOTE — Assessment & Plan Note (Signed)
Likely related to an upper respiratory infection secondary to a viral infection. Looks well on exam today. - Provided Tessalon Perles - Encouraged to stop smoking. - Follow-up as needed.

## 2017-08-09 NOTE — Patient Instructions (Addendum)
Thank you for coming in,   Please follow up if your symptoms do not improve. Please try to cut down on your smoking.    Please feel free to call with any questions or concerns at any time, at [phone]. --Dr. Raeford Razor   Please think about quitting smoking.  This is very important for your health.  There are many things we can do to help you quit.  Let  us know if you are interested.  You can also call 1-800-QUIT-NOW [phone]) for free smoking cessation counseling.

## 2017-08-09 NOTE — Progress Notes (Signed)
SAMANDA BUSKE - 71 y.o. female MRN [id number]  Date of birth: May 06, 1946  SUBJECTIVE:  Including CC & ROS.  Chief Complaint  Patient presents with  . Cough    X1 week coughing up clear phlegm. Patient states coughing fits start at about 3 in the morning. patient has been taking mucinex and nyquil is not helping     Ms. Enerson Is a 71 year old female presenting with cough for one week. She reports the coughing is worse at night. She is not currently coughing now. She denies any fevers or chills. Does not have any sick contacts. Has tried Mucinex and NyQuil. She is a current tobacco user. Has had production of sputum but is clear in nature.   Tobacco use. She reports not wanting to quit currently. Has tried Chantix which caused vomiting.  Review of Systems  Constitutional: Negative for fever.  Respiratory: Positive for cough. Negative for shortness of breath.   Cardiovascular: Negative for chest pain.  Skin: Negative for rash.    HISTORY: Past Medical, Surgical, Social, and Family History Reviewed & Updated per EMR.   Pertinent Historical Findings include:  Past Medical History:  Diagnosis Date  . Arthritis   . Chronic diarrhea   . COPD  GOLD 0 / active smoker  10/24/2015  . Heart murmur   . Hemorrhoids   . Hyperglycemia 05/01/2013  . Hypertension   . Pneumonia 05/01/2013  . PVD (peripheral vascular disease) ([place])   . Urine incontinence     Past Surgical History:  Procedure Laterality Date  . ABDOMINAL HYSTERECTOMY    . HEMORRHOID SURGERY N/A 06/26/2015   Procedure: INTERNAL AND EXTERNAL HEMORRHOIDECTOMY;  Surgeon: Georganna Skeans, MD;  Location: [facility];  Service: General;  Laterality: N/A;    Allergies  Allergen Reactions  . Ace Inhibitors Cough    History reviewed. No pertinent family history.   Social History   Social History  . Marital status: Married    Spouse name: N/A  . Number of children: N/A  . Years of education: 62   Occupational  History  . retired    Social History Main Topics  . Smoking status: Current Every Day Smoker    Packs/day: 1.50    Years: 58.00    Types: Cigarettes  . Smokeless tobacco: Never Used  . Alcohol use 0.6 oz/week    1 Glasses of wine per week     Comment: occassional  . Drug use: No  . Sexual activity: No   Other Topics Concern  . Not on file   Social History Narrative  . No narrative on file     PHYSICAL EXAM:  VS: BP (!) 160/78 (BP Location: Left Arm, Patient Position: Sitting, Cuff Size: Normal)   Pulse 80   Temp 98 F (36.7 C) (Oral)   Ht 5\' 2"  (1.575 m)   Wt 158 lb (71.7 kg)   SpO2 99%   BMI 28.90 kg/m  Physical Exam  Constitutional: She is oriented to person, place, and time. She appears well-developed and well-nourished.  HENT:  Head: Normocephalic and atraumatic.  Mouth/Throat: Oropharynx is clear and moist.  Tympanic membranes clear and intact bilaterally  Eyes: Conjunctivae and EOM are normal.  Neck: Normal range of motion. Neck supple.  Cardiovascular: Normal rate, regular rhythm and normal heart sounds.   Pulmonary/Chest: Effort normal and breath sounds normal. She has no wheezes.  Abdominal: Soft. Bowel sounds are normal.  Musculoskeletal: Normal range of motion. She exhibits no edema.  Neurological: She is alert and oriented to person, place, and time.  Skin: Skin is warm and dry.       ASSESSMENT & PLAN:   Cigarette smoker Not currently wanting to stop smoking. She had an intolerance to Chantix.  Cough Likely related to an upper respiratory infection secondary to a viral infection. Looks well on exam today. - Provided Tessalon Perles - Encouraged to stop smoking. - Follow-up as needed.

## 2017-08-09 NOTE — Assessment & Plan Note (Signed)
Not currently wanting to stop smoking. She had an intolerance to Chantix.

## 2017-08-10 ENCOUNTER — Telehealth: Payer: Self-pay | Admitting: Family Medicine

## 2017-08-10 NOTE — Telephone Encounter (Signed)
Patient has called back in regard.   Asked patient to allow 24 hours for response.

## 2017-08-10 NOTE — Telephone Encounter (Signed)
Patient states that Dr. Raeford Razor had prescribed her tessalon.  Patient states once she got home she realized that she had taken this medication before and it didn't work.  Patient is requesting something in place of this.  Patient also would like to know if Dr. Raeford Razor could prescribe anything for chills?  [facility] [address]

## 2017-08-10 NOTE — Telephone Encounter (Signed)
pls advise on msg below.../lmb 

## 2017-08-11 MED ORDER — HYDROCODONE-HOMATROPINE 5-1.5 MG/5ML PO SYRP: 5.0000 mL | ORAL_SOLUTION | Freq: Three times a day (TID) | ORAL | 0 refills | Status: DC | PRN

## 2017-08-11 NOTE — Telephone Encounter (Signed)
Will send in hycodan for patient's cough. Fax to pharmacy.   Rosemarie Ax, MD [facility] 08/11/2017, 1:14 PM

## 2017-08-17 ENCOUNTER — Encounter: Payer: Self-pay | Admitting: Internal Medicine

## 2017-08-17 ENCOUNTER — Ambulatory Visit (INDEPENDENT_AMBULATORY_CARE_PROVIDER_SITE_OTHER): Payer: Medicare Other | Admitting: Internal Medicine

## 2017-08-17 VITALS — BP 132/84 | HR 75 | Temp 98.5°F | Ht 62.0 in | Wt 158.0 lb

## 2017-08-17 DIAGNOSIS — R7302 Impaired glucose tolerance (oral): Secondary | ICD-10-CM | POA: Diagnosis not present

## 2017-08-17 DIAGNOSIS — I10 Essential (primary) hypertension: Secondary | ICD-10-CM

## 2017-08-17 DIAGNOSIS — F17210 Nicotine dependence, cigarettes, uncomplicated: Secondary | ICD-10-CM

## 2017-08-17 DIAGNOSIS — J449 Chronic obstructive pulmonary disease, unspecified: Secondary | ICD-10-CM | POA: Diagnosis not present

## 2017-08-17 DIAGNOSIS — R05 Cough: Secondary | ICD-10-CM | POA: Diagnosis not present

## 2017-08-17 DIAGNOSIS — R059 Cough, unspecified: Secondary | ICD-10-CM

## 2017-08-17 DIAGNOSIS — R911 Solitary pulmonary nodule: Secondary | ICD-10-CM

## 2017-08-17 MED ORDER — LEVOFLOXACIN 250 MG PO TABS: 250.0000 mg | ORAL_TABLET | Freq: Every day | ORAL | 0 refills | Status: DC

## 2017-08-17 MED ORDER — HYDROCODONE-HOMATROPINE 5-1.5 MG/5ML PO SYRP: 5.0000 mL | ORAL_SOLUTION | Freq: Four times a day (QID) | ORAL | 0 refills | Status: AC | PRN

## 2017-08-17 NOTE — Assessment & Plan Note (Signed)
stable overall by history and exam, and pt to continue medical treatment as before,  to f/u any worsening symptoms or concerns 

## 2017-08-17 NOTE — Assessment & Plan Note (Signed)
Lab Results  Component Value Date   HGBA1C 5.9 04/13/2014  stable overall by history and exam, recent data reviewed with pt, and pt to continue medical treatment as before,  to f/u any worsening symptoms or concerns

## 2017-08-17 NOTE — Assessment & Plan Note (Signed)
Urged to quit, declines chantix 

## 2017-08-17 NOTE — Assessment & Plan Note (Signed)
Will need f/u CT chest no CM,  to f/u any worsening symptoms or concerns

## 2017-08-17 NOTE — Assessment & Plan Note (Signed)
Mild to mod, c/w bronchitis vs pna, for cxr, also for antibx course, cough med prn,  to f/u any worsening symptoms or concerns 

## 2017-08-17 NOTE — Patient Instructions (Signed)
Please take all new medication as prescribed - the antibiotic, cough medicine  Please call if you start wheezing or short of breath  Please continue all other medications as before, and refills have been done if requested.  Please have the pharmacy call with any other refills you may need.  Please keep your appointments with your specialists as you may have planned  You will be contacted regarding the referral for: CT chest

## 2017-08-17 NOTE — Assessment & Plan Note (Signed)
stable overall by history and exam, recent data reviewed with pt, and pt to continue medical treatment as before,  to f/u any worsening symptoms or concerns BP Readings from Last 3 Encounters:  08/17/17 132/84  08/09/17 (!) 160/78  06/17/17 138/64

## 2017-08-17 NOTE — Progress Notes (Signed)
Subjective:    Patient ID: Elaine Turner, female    DOB: 04-05-1946, 71 y.o.   MRN: [id number]  HPI  Here with acute onset mild to mod 5-6 days ST, HA, general weakness and malaise, with prod cough greenish sputum, but Pt denies chest pain, increased sob or doe, wheezing, orthopnea, PND, increased LE swelling, palpitations, dizziness or syncope.  Pt denies  wt loss, night sweats, loss of appetite, or other constitutional symptoms  Pt denies polydipsia, polyuria. Still smoking, not ready to quit. Pt denies new neurological symptoms such as new headache, or facial or extremity weakness or numbness Past Medical History:  Diagnosis Date  . Arthritis   . Chronic diarrhea   . COPD  GOLD 0 / active smoker  10/24/2015  . Heart murmur   . Hemorrhoids   . Hyperglycemia 05/01/2013  . Hypertension   . Pneumonia 05/01/2013  . PVD (peripheral vascular disease) ([place])   . Urine incontinence    Past Surgical History:  Procedure Laterality Date  . ABDOMINAL HYSTERECTOMY    . HEMORRHOID SURGERY N/A 06/26/2015   Procedure: INTERNAL AND EXTERNAL HEMORRHOIDECTOMY;  Surgeon: Georganna Skeans, MD;  Location: [facility];  Service: General;  Laterality: N/A;    reports that she has been smoking Cigarettes.  She has a 87.00 pack-year smoking history. She has never used smokeless tobacco. She reports that she drinks about 0.6 oz of alcohol per week . She reports that she does not use drugs. family history is not on file. Allergies  Allergen Reactions  . Ace Inhibitors Cough   Current Outpatient Prescriptions on File Prior to Visit  Medication Sig Dispense Refill  . aspirin 81 MG EC tablet Take 1 tablet (81 mg total) by mouth daily. Swallow whole. 30 tablet 12  . benzonatate (TESSALON) 100 MG capsule Take 1 capsule (100 mg total) by mouth 2 (two) times daily as needed for cough. 20 capsule 0  . hydrochlorothiazide (HYDRODIURIL) 25 MG tablet Take 1 tablet (25 mg total) by mouth daily. 90 tablet 3    . losartan (COZAAR) 50 MG tablet Take 1 tablet (50 mg total) by mouth daily. 90 tablet 3  . NIFEdipine (PROCARDIA XL/ADALAT-CC) 90 MG 24 hr tablet TAKE 1 TABLET (90 MG TOTAL) BY MOUTH DAILY. 90 tablet 3   No current facility-administered medications on file prior to visit.    Review of Systems  Constitutional: Negative for other unusual diaphoresis or sweats HENT: Negative for ear discharge or swelling Eyes: Negative for other worsening visual disturbances Respiratory: Negative for stridor or other swelling  Gastrointestinal: Negative for worsening distension or other blood Genitourinary: Negative for retention or other urinary change Musculoskeletal: Negative for other MSK pain or swelling Skin: Negative for color change or other new lesions Neurological: Negative for worsening tremors and other numbness  Psychiatric/Behavioral: Negative for worsening agitation or other fatigue All other system neg per pt    Objective:   Physical Exam BP 132/84   Pulse 75   Temp 98.5 F (36.9 C) (Oral)   Ht 5\' 2"  (1.575 m)   Wt 158 lb (71.7 kg)   SpO2 97%   BMI 28.90 kg/m  VS noted, mild ill Constitutional: Pt appears in NAD HENT: Head: NCAT.  Right Ear: External ear normal.  Left Ear: External ear normal.  Eyes: . Pupils are equal, round, and reactive to light. Conjunctivae and EOM are normal Nose: without d/c or deformity Bilat tm's with mild erythema.  Max sinus areas non  tender.  Pharynx with mild erythema, no exudate Neck: Neck supple. Gross normal ROM Cardiovascular: Normal rate and regular rhythm.   Pulmonary/Chest: Effort normal and breath sounds decreased  without rales or wheezing.  Neurological: Pt is alert. At baseline orientation, motor grossly intact Skin: Skin is warm. No rashes, other new lesions, no LE edema Psychiatric: Pt behavior is normal without agitation  No other exam findings   10/16/2015 CT CHEST WITHOUT CONTRAST - summary IMPRESSION: 9 mm lingular nodule  corresponding to the density on prior chest x-ray. Cannot exclude malignancy. Consider further evaluation with PET CT or close interval follow-up with repeat chest CT in 3-6 months. Coronary artery disease, aortic atherosclerosis. Cardiomegaly.      Assessment & Plan:

## 2017-08-18 ENCOUNTER — Telehealth: Payer: Self-pay | Admitting: Internal Medicine

## 2017-08-18 MED ORDER — CEPHALEXIN 500 MG PO CAPS: 500.0000 mg | ORAL_CAPSULE | Freq: Three times a day (TID) | ORAL | 0 refills | Status: AC

## 2017-08-18 NOTE — Telephone Encounter (Signed)
OK for change levaquin to cephalexin  levaquin added to med intolerance list

## 2017-08-18 NOTE — Telephone Encounter (Signed)
Pt called in said that the med levofloxacin (LEVAQUIN) 250 MG tablet [[id number]] That was given to her yesterday morning at her appt, is hurting her stomach.  diarrhea  and vomiting . Is there anything else that she can take ?    Best number [phone]

## 2017-08-18 NOTE — Telephone Encounter (Signed)
Notified pt w/MD response [facility].../lmb

## 2017-08-19 ENCOUNTER — Inpatient Hospital Stay: Admission: RE | Admit: 2017-08-19 | Payer: Medicare Other | Source: Ambulatory Visit

## 2017-08-19 ENCOUNTER — Other Ambulatory Visit: Payer: Medicare Other

## 2017-09-27 ENCOUNTER — Ambulatory Visit (INDEPENDENT_AMBULATORY_CARE_PROVIDER_SITE_OTHER): Payer: Medicare Other | Admitting: Internal Medicine

## 2017-09-27 ENCOUNTER — Encounter: Payer: Self-pay | Admitting: Internal Medicine

## 2017-09-27 ENCOUNTER — Telehealth: Payer: Self-pay | Admitting: Internal Medicine

## 2017-09-27 DIAGNOSIS — R05 Cough: Secondary | ICD-10-CM | POA: Diagnosis not present

## 2017-09-27 DIAGNOSIS — R059 Cough, unspecified: Secondary | ICD-10-CM

## 2017-09-27 MED ORDER — HYDROCODONE-HOMATROPINE 5-1.5 MG/5ML PO SYRP: 5.0000 mL | ORAL_SOLUTION | Freq: Three times a day (TID) | ORAL | 0 refills | Status: DC | PRN

## 2017-09-27 MED ORDER — DOXYCYCLINE HYCLATE 100 MG PO TABS: 100.0000 mg | ORAL_TABLET | Freq: Two times a day (BID) | ORAL | 0 refills | Status: DC

## 2017-09-27 NOTE — Progress Notes (Signed)
   Subjective:    Patient ID: Elaine Turner, female    DOB: November 17, 1946, 71 y.o.   MRN: [id number]  HPI  The patient is a 71 YO female coming in for cough, fevers, chills since Friday. She is a smoker but not smoking as much since symptoms started. Coughing up yellow sputum sometimes. Some coughing which leads to gagging. Denies vomiting at all. She has had sick contacts. Taking ibuprofen and tylenol for the fevers which is helping some. Has not tried anything else. Some sinus congestion and pressure. Some nasal drainage which is clear. Denies sinus pain. Mild headaches. Muscle pain in her chest with coughing.   Review of Systems  Constitutional: Positive for activity change, appetite change, chills, fatigue and fever. Negative for unexpected weight change.  HENT: Positive for congestion, rhinorrhea, sinus pain and sinus pressure. Negative for ear discharge, ear pain, postnasal drip, sore throat, tinnitus, trouble swallowing and voice change.   Eyes: Negative.   Respiratory: Positive for cough and shortness of breath. Negative for chest tightness and wheezing.   Cardiovascular: Negative.   Gastrointestinal: Positive for nausea. Negative for abdominal distention, abdominal pain, diarrhea and vomiting.  Musculoskeletal: Negative.   Skin: Negative.       Objective:   Physical Exam  Constitutional: She appears well-developed.  HENT:  Head: Normocephalic and atraumatic.  Right Ear: External ear normal.  Left Ear: External ear normal.  Oropharynx with erythema and clear drainage, nasal turbinates swollen.  Eyes: EOM are normal.  Neck: Normal range of motion. No JVD present.  Cardiovascular: Normal rate and regular rhythm.   Pulmonary/Chest: Effort normal. No respiratory distress. She has no wheezes. She has no rales.  Some rhonchi bilaterally which does not clear with coughing  Abdominal: Soft.  Lymphadenopathy:    She has no cervical adenopathy.  Skin: Skin is warm and dry.   Vitals:     09/27/17 1345  BP: (!) 200/70  Pulse: (!) 105  Temp: (!) 100.9 F (38.3 C)  TempSrc: Oral  SpO2: 99%  Weight: 154 lb (69.9 kg)  Height: 5\' 2"  (1.575 m)      Assessment & Plan:

## 2017-09-27 NOTE — Assessment & Plan Note (Signed)
Given severity and fevers treat with doxycycline and hycodan. She will need repeat CT chest due to lesion in 2016 and reminded her about the need to do this. She is a smoker and reminded about the need to quit.

## 2017-09-27 NOTE — Patient Instructions (Signed)
Make sure to get the CT scan of the lungs done once you feel better.  We have given you the cough syrup to use.   We have sent in an antibiotic called doxycycline to take 1 pill twice a day for 1 week.

## 2017-09-27 NOTE — Telephone Encounter (Signed)
Patient Name: Elaine Turner DOB: 1946/07/07 Initial Comment Caller states, she wants an appt, she has chills, sweating, bad coughs, diarrhea, eyes hurt, fever but has no thermometer Nurse Assessment Nurse: Ronnald Ramp, RN, Miranda Date/Time (Eastern Time): 09/27/2017 8:06:03 AM Confirm and document reason for call. If symptomatic, describe symptoms. ---Caller states she has cold and cough since Friday. Her face hurts, her eyes hurt, she has diarrhea, and hot/cold flashes. She does not have Does the patient have any new or worsening symptoms? ---Yes Will a triage be completed? ---Yes Related visit to physician within the last 2 weeks? ---No Does the PT have any chronic conditions? (i.e. diabetes, asthma, etc.) ---Yes List chronic conditions. ---HTN Is this a behavioral health or substance abuse call? ---No Guidelines Guideline Title Affirmed Question Affirmed Notes Cough - Acute Productive SEVERE coughing spells (e.g., whooping sound after coughing, vomiting after coughing) Final Disposition User See Physician within 24 Hours Ronnald Ramp, RN, Miranda Comments No appt available with PCP. Appt scheduled for today at [facility] with Dr. Sharlet Salina. Referrals REFERRED TO PCP OFFICE Caller Disagree/Comply Comply Caller Understands Yes PreDisposition Call Doctor

## 2017-09-30 ENCOUNTER — Observation Stay (HOSPITAL_COMMUNITY)
Admission: EM | Admit: 2017-09-30 | Discharge: 2017-10-01 | Disposition: A | Discharge status: Home / Self Care | Payer: Medicare Other | Attending: Family Medicine | Admitting: Family Medicine

## 2017-09-30 ENCOUNTER — Encounter (HOSPITAL_COMMUNITY): Payer: Self-pay

## 2017-09-30 ENCOUNTER — Telehealth: Payer: Self-pay | Admitting: Internal Medicine

## 2017-09-30 ENCOUNTER — Emergency Department (HOSPITAL_COMMUNITY): Payer: Medicare Other

## 2017-09-30 DIAGNOSIS — M1990 Unspecified osteoarthritis, unspecified site: Secondary | ICD-10-CM | POA: Insufficient documentation

## 2017-09-30 DIAGNOSIS — I129 Hypertensive chronic kidney disease with stage 1 through stage 4 chronic kidney disease, or unspecified chronic kidney disease: Secondary | ICD-10-CM | POA: Insufficient documentation

## 2017-09-30 DIAGNOSIS — E876 Hypokalemia: Secondary | ICD-10-CM | POA: Diagnosis not present

## 2017-09-30 DIAGNOSIS — J189 Pneumonia, unspecified organism: Secondary | ICD-10-CM | POA: Diagnosis present

## 2017-09-30 DIAGNOSIS — R0603 Acute respiratory distress: Secondary | ICD-10-CM | POA: Insufficient documentation

## 2017-09-30 DIAGNOSIS — R0600 Dyspnea, unspecified: Secondary | ICD-10-CM

## 2017-09-30 DIAGNOSIS — F17210 Nicotine dependence, cigarettes, uncomplicated: Secondary | ICD-10-CM | POA: Diagnosis present

## 2017-09-30 DIAGNOSIS — J440 Chronic obstructive pulmonary disease with acute lower respiratory infection: Secondary | ICD-10-CM | POA: Diagnosis not present

## 2017-09-30 DIAGNOSIS — I10 Essential (primary) hypertension: Secondary | ICD-10-CM | POA: Diagnosis not present

## 2017-09-30 DIAGNOSIS — R05 Cough: Secondary | ICD-10-CM | POA: Diagnosis not present

## 2017-09-30 DIAGNOSIS — J441 Chronic obstructive pulmonary disease with (acute) exacerbation: Secondary | ICD-10-CM

## 2017-09-30 DIAGNOSIS — E785 Hyperlipidemia, unspecified: Secondary | ICD-10-CM | POA: Diagnosis not present

## 2017-09-30 DIAGNOSIS — N179 Acute kidney failure, unspecified: Secondary | ICD-10-CM

## 2017-09-30 DIAGNOSIS — E871 Hypo-osmolality and hyponatremia: Secondary | ICD-10-CM | POA: Diagnosis present

## 2017-09-30 DIAGNOSIS — N182 Chronic kidney disease, stage 2 (mild): Secondary | ICD-10-CM | POA: Diagnosis not present

## 2017-09-30 DIAGNOSIS — Z7982 Long term (current) use of aspirin: Secondary | ICD-10-CM | POA: Diagnosis not present

## 2017-09-30 DIAGNOSIS — N183 Chronic kidney disease, stage 3 (moderate): Secondary | ICD-10-CM | POA: Diagnosis not present

## 2017-09-30 DIAGNOSIS — J069 Acute upper respiratory infection, unspecified: Secondary | ICD-10-CM | POA: Diagnosis present

## 2017-09-30 DIAGNOSIS — D649 Anemia, unspecified: Secondary | ICD-10-CM | POA: Diagnosis not present

## 2017-09-30 DIAGNOSIS — J449 Chronic obstructive pulmonary disease, unspecified: Secondary | ICD-10-CM | POA: Diagnosis present

## 2017-09-30 DIAGNOSIS — Z79899 Other long term (current) drug therapy: Secondary | ICD-10-CM | POA: Insufficient documentation

## 2017-09-30 DIAGNOSIS — I739 Peripheral vascular disease, unspecified: Secondary | ICD-10-CM | POA: Diagnosis present

## 2017-09-30 LAB — CREATININE, SERUM
Creatinine, Ser: 1.6 mg/dL — ABNORMAL HIGH (ref 0.44–1.00)
GFR calc Af Amer: 36 mL/min — ABNORMAL LOW (ref >60)
GFR calc non Af Amer: 31 mL/min — ABNORMAL LOW (ref >60)

## 2017-09-30 LAB — MAGNESIUM: Magnesium: 1.7 mg/dL (ref 1.7–2.4)

## 2017-09-30 LAB — CBC
HCT: 38.1 % (ref 36.0–46.0)
Hemoglobin: 12 g/dL (ref 12.0–15.0)
MCH: 23.7 pg — ABNORMAL LOW (ref 26.0–34.0)
MCHC: 31.5 g/dL (ref 30.0–36.0)
MCV: 75.1 fL — ABNORMAL LOW (ref 78.0–100.0)
Platelets: 489 10*3/uL — ABNORMAL HIGH (ref 150–400)
RBC: 5.07 MIL/uL (ref 3.87–5.11)
RDW: 15.1 % (ref 11.5–15.5)
WBC: 19.2 10*3/uL — ABNORMAL HIGH (ref 4.0–10.5)

## 2017-09-30 LAB — BASIC METABOLIC PANEL
Anion gap: 15 (ref 5–15)
BUN: 30 mg/dL — ABNORMAL HIGH (ref 6–20)
CO2: 22 mmol/L (ref 22–32)
Calcium: 9.1 mg/dL (ref 8.9–10.3)
Chloride: 94 mmol/L — ABNORMAL LOW (ref 101–111)
Creatinine, Ser: 1.75 mg/dL — ABNORMAL HIGH (ref 0.44–1.00)
GFR calc Af Amer: 32 mL/min — ABNORMAL LOW (ref >60)
GFR calc non Af Amer: 28 mL/min — ABNORMAL LOW (ref >60)
Glucose, Bld: 101 mg/dL — ABNORMAL HIGH (ref 65–99)
Potassium: 2.7 mmol/L — CL (ref 3.5–5.1)
Sodium: 131 mmol/L — ABNORMAL LOW (ref 135–145)

## 2017-09-30 LAB — URINALYSIS, ROUTINE W REFLEX MICROSCOPIC
Bilirubin Urine: NEGATIVE
Glucose, UA: NEGATIVE mg/dL
Hgb urine dipstick: NEGATIVE
Ketones, ur: NEGATIVE mg/dL
Leukocytes, UA: NEGATIVE
Nitrite: NEGATIVE
Protein, ur: NEGATIVE mg/dL
Specific Gravity, Urine: 1.004 — ABNORMAL LOW (ref 1.005–1.030)
pH: 6 (ref 5.0–8.0)

## 2017-09-30 LAB — STREP PNEUMONIAE URINARY ANTIGEN: Strep Pneumo Urinary Antigen: NEGATIVE

## 2017-09-30 LAB — I-STAT CG4 LACTIC ACID, ED: Lactic Acid, Venous: 1.83 mmol/L (ref 0.5–1.9)

## 2017-09-30 IMAGING — DX DG CHEST 2V
2 series · 2 of 2 positions shown · non-contrast
Comparison: [DATE]

CLINICAL DATA: Cough

EXAM:
CHEST  2 VIEW

[chest pa]
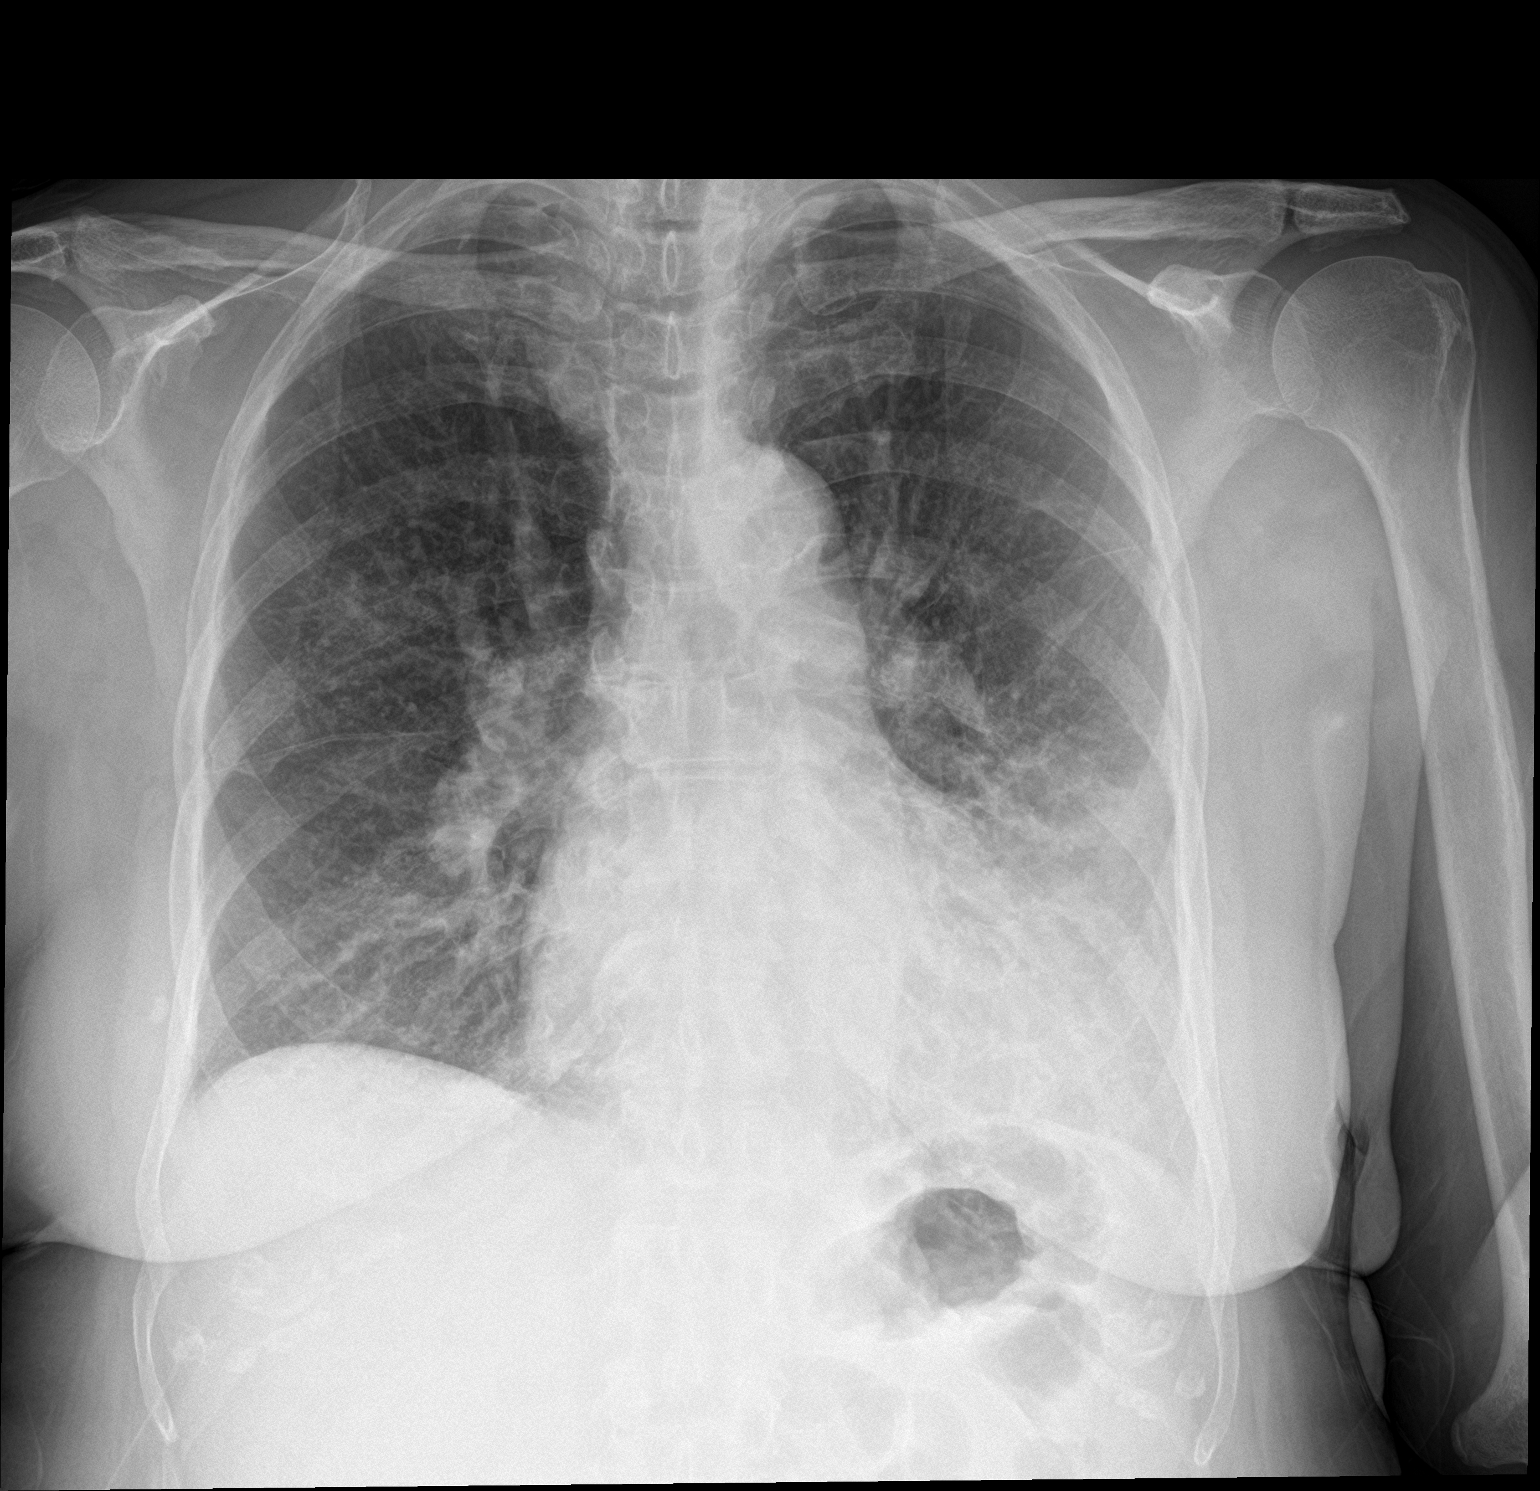

[chest lat]
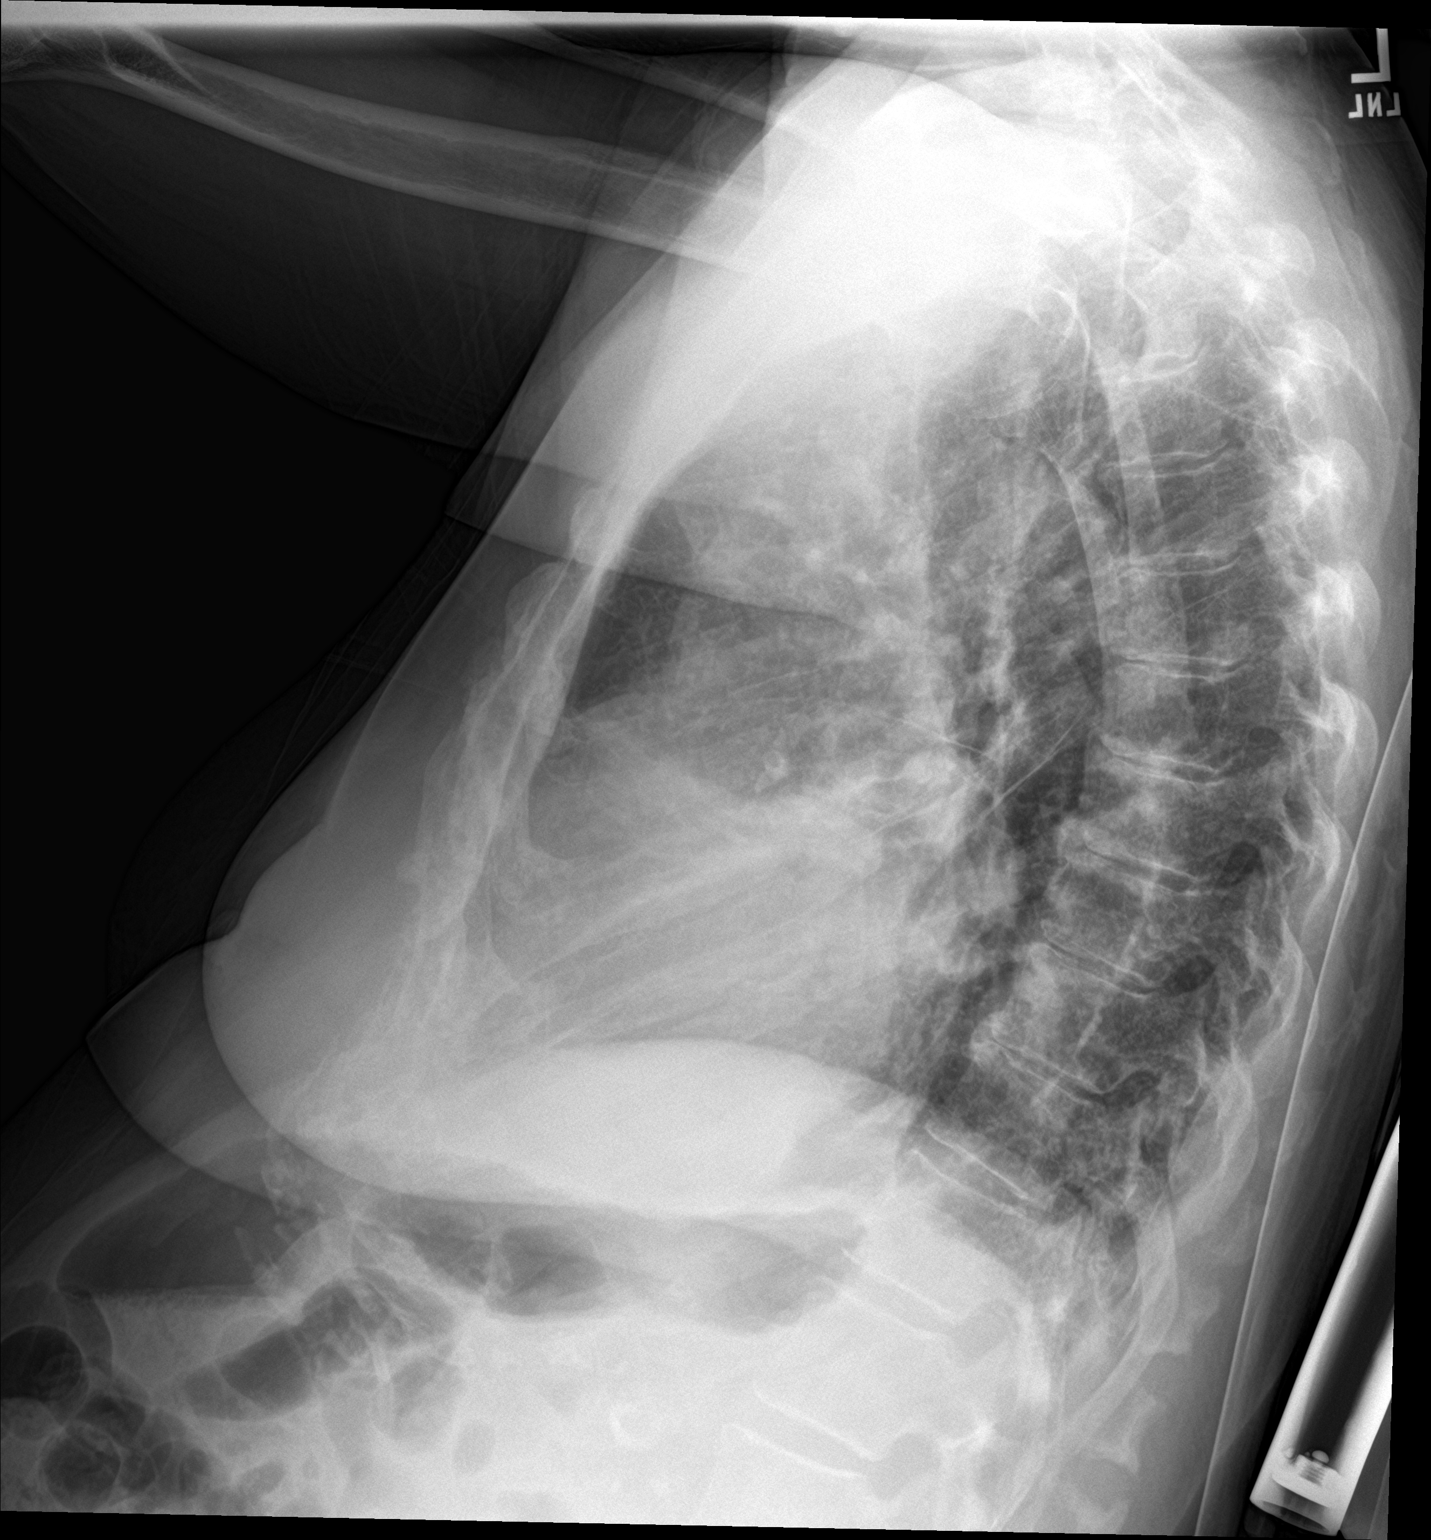

[2 of 2 positions shown; findings below may reference images not displayed]

FINDINGS: Cardiac shadow is stable. Aortic calcifications are noted. Diffuse
interstitial changes are identified throughout both lungs. Acute
lingular infiltrate is noted without sizable effusion. No bony
abnormality is noted.
IMPRESSION: Acute lingular pneumonia.

## 2017-09-30 MED ORDER — DEXTROSE 5 % IV SOLN
1.0000 g | INTRAVENOUS | Status: DC
  Filled 2017-09-30: qty 10

## 2017-09-30 MED ORDER — ASPIRIN EC 81 MG PO TBEC
81.0000 mg | DELAYED_RELEASE_TABLET | Freq: Every day | ORAL | Status: DC
  Administered 2017-10-01: 81 mg via ORAL
  Filled 2017-09-30: qty 1

## 2017-09-30 MED ORDER — METHYLPREDNISOLONE SODIUM SUCC 125 MG IJ SOLR
60.0000 mg | INTRAMUSCULAR | Status: DC
  Administered 2017-10-01: 60 mg via INTRAVENOUS
  Filled 2017-09-30: qty 2

## 2017-09-30 MED ORDER — METHYLPREDNISOLONE SODIUM SUCC 125 MG IJ SOLR
60.0000 mg | Freq: Four times a day (QID) | INTRAMUSCULAR | Status: DC
  Administered 2017-09-30: 60 mg via INTRAVENOUS
  Filled 2017-09-30: qty 2

## 2017-09-30 MED ORDER — ACETAMINOPHEN 325 MG PO TABS: 650.0000 mg | ORAL_TABLET | Freq: Four times a day (QID) | ORAL | Status: DC | PRN

## 2017-09-30 MED ORDER — NIFEDIPINE ER OSMOTIC RELEASE 90 MG PO TB24
90.0000 mg | ORAL_TABLET | Freq: Every day | ORAL | Status: DC
  Administered 2017-10-01: 90 mg via ORAL
  Filled 2017-09-30: qty 1

## 2017-09-30 MED ORDER — IPRATROPIUM-ALBUTEROL 0.5-2.5 (3) MG/3ML IN SOLN
3.0000 mL | RESPIRATORY_TRACT | Status: DC
  Administered 2017-09-30 (×3): 3 mL via RESPIRATORY_TRACT
  Filled 2017-09-30 (×3): qty 3

## 2017-09-30 MED ORDER — SODIUM CHLORIDE 0.9 % IV SOLN
INTRAVENOUS | Status: DC
  Administered 2017-09-30 – 2017-10-01 (×2): via INTRAVENOUS

## 2017-09-30 MED ORDER — HEPARIN SODIUM (PORCINE) 5000 UNIT/ML IJ SOLN
5000.0000 [IU] | Freq: Three times a day (TID) | INTRAMUSCULAR | Status: DC
  Administered 2017-09-30 – 2017-10-01 (×2): 5000 [IU] via SUBCUTANEOUS
  Filled 2017-09-30 (×2): qty 1

## 2017-09-30 MED ORDER — BENZONATATE 100 MG PO CAPS
100.0000 mg | ORAL_CAPSULE | Freq: Two times a day (BID) | ORAL | Status: DC | PRN
Start: 2017-09-30 — End: 2017-10-01
  Administered 2017-10-01: 100 mg via ORAL
  Filled 2017-09-30: qty 1

## 2017-09-30 MED ORDER — ONDANSETRON HCL 4 MG PO TABS: 4.0000 mg | ORAL_TABLET | Freq: Four times a day (QID) | ORAL | Status: DC | PRN

## 2017-09-30 MED ORDER — POTASSIUM CHLORIDE CRYS ER 20 MEQ PO TBCR
40.0000 meq | EXTENDED_RELEASE_TABLET | Freq: Once | ORAL | Status: AC
  Administered 2017-09-30: 40 meq via ORAL
  Filled 2017-09-30: qty 2

## 2017-09-30 MED ORDER — DEXTROSE 5 % IV SOLN
500.0000 mg | INTRAVENOUS | Status: DC
  Filled 2017-09-30: qty 500

## 2017-09-30 MED ORDER — POTASSIUM CHLORIDE CRYS ER 20 MEQ PO TBCR: 40.0000 meq | EXTENDED_RELEASE_TABLET | Freq: Once | ORAL | Status: DC

## 2017-09-30 MED ORDER — CEFTRIAXONE SODIUM 1 G IJ SOLR
1.0000 g | Freq: Once | INTRAMUSCULAR | Status: AC
  Administered 2017-09-30: 1 g via INTRAVENOUS
  Filled 2017-09-30: qty 10

## 2017-09-30 MED ORDER — HYDROCODONE-HOMATROPINE 5-1.5 MG/5ML PO SYRP: 5.0000 mL | ORAL_SOLUTION | Freq: Three times a day (TID) | ORAL | Status: DC | PRN

## 2017-09-30 MED ORDER — IPRATROPIUM-ALBUTEROL 0.5-2.5 (3) MG/3ML IN SOLN
3.0000 mL | Freq: Three times a day (TID) | RESPIRATORY_TRACT | Status: DC
  Administered 2017-10-01: 3 mL via RESPIRATORY_TRACT
  Filled 2017-09-30: qty 3

## 2017-09-30 MED ORDER — SENNOSIDES-DOCUSATE SODIUM 8.6-50 MG PO TABS
1.0000 | ORAL_TABLET | Freq: Every evening | ORAL | Status: DC | PRN
Start: 2017-09-30 — End: 2017-10-01

## 2017-09-30 MED ORDER — ACETAMINOPHEN 650 MG RE SUPP: 650.0000 mg | Freq: Four times a day (QID) | RECTAL | Status: DC | PRN

## 2017-09-30 MED ORDER — ONDANSETRON HCL 4 MG/2ML IJ SOLN: 4.0000 mg | Freq: Four times a day (QID) | INTRAMUSCULAR | Status: DC | PRN

## 2017-09-30 MED ORDER — DEXTROSE 5 % IV SOLN
500.0000 mg | Freq: Once | INTRAVENOUS | Status: AC
  Administered 2017-09-30: 500 mg via INTRAVENOUS
  Filled 2017-09-30: qty 500

## 2017-09-30 NOTE — ED Notes (Signed)
Attempted report x1. 

## 2017-09-30 NOTE — H&P (Signed)
History and Physical    Elaine Turner KKX:[id number] DOB: 21-Jan-1946 DOA: 09/30/2017  PCP: Biagio Borg, MD Patient coming from: home  Chief Complaint: coug HPI: Elaine Turner is a very pleasant 71 y.o. female with medical history significant for COPD, hyperglycemia, hypertension, peripheral vascular disease presents to the emergency department with a persistent worsening cough. Initial evaluation in the emergency department includes chest x-ray concerning for pneumonia  Information is obtained from the patient. She reports she's had a persistent nonproductive cough for "several weeks. Over the last week cough has worsened become productive and "wet sounding". She saw primary care provider 3 days ago was placed on doxycycline. She feels that the antibiotic "is no good". She continues to feel worse specifically more coughing more sputum production worsening shortness of breath. She denies headache dizziness syncope or near-syncope. She denies chest pain palpitations lower extremity edema or orthopnea. She states she is able to drink fluids but is not eating very much as she has no appetite. She denies abdominal pain nausea vomiting diarrhea. She denies dysuria hematuria frequency or urgency. She denies fever chills recent travel or sick contacts. She does admit to continuing to smoke    ED Course: In the emergency department she is afebrile hemodynamically stable with a blood pressure the high end of normal. Oxygen saturation level greater than 90% on room air she is provided with Rocephin and azithromycin in the emergency department  Review of Systems: As per HPI otherwise all other systems reviewed and are negative.   Ambulatory Status: Lives alone and ambulates independently and is independent with ADLs no recent falls  Past Medical History:  Diagnosis Date  . Arthritis   . Chronic diarrhea   . COPD  GOLD 0 / active smoker  10/24/2015  . Heart murmur   . Hemorrhoids   .  Hyperglycemia 05/01/2013  . Hypertension   . Pneumonia 05/01/2013  . PVD (peripheral vascular disease) ([place])   . Urine incontinence     Past Surgical History:  Procedure Laterality Date  . ABDOMINAL HYSTERECTOMY    . HEMORRHOID SURGERY N/A 06/26/2015   Procedure: INTERNAL AND EXTERNAL HEMORRHOIDECTOMY;  Surgeon: Georganna Skeans, MD;  Location: [place];  Service: General;  Laterality: N/A;    Social History   Social History  . Marital status: Married    Spouse name: N/A  . Number of children: N/A  . Years of education: 91   Occupational History  . retired    Social History Main Topics  . Smoking status: Current Every Day Smoker    Packs/day: 1.50    Years: 58.00    Types: Cigarettes  . Smokeless tobacco: Never Used  . Alcohol use 0.6 oz/week    1 Glasses of wine per week     Comment: occassional  . Drug use: No  . Sexual activity: No   Other Topics Concern  . Not on file   Social History Narrative  . No narrative on file    Allergies  Allergen Reactions  . Ace Inhibitors Cough  . Levaquin [Levofloxacin] Other (See Comments)    GI upset    Family History  Problem Relation Age of Onset  . Hypertension Mother   . Lung cancer Father     Prior to Admission medications   Medication Sig Start Date End Date Taking? Authorizing Provider  aspirin 81 MG EC tablet Take 1 tablet (81 mg total) by mouth daily. Swallow whole. 03/19/17   Biagio Borg,  MD  benzonatate (TESSALON) 100 MG capsule Take 1 capsule (100 mg total) by mouth 2 (two) times daily as needed for cough. 08/09/17   Rosemarie Ax, MD  hydrochlorothiazide (HYDRODIURIL) 25 MG tablet Take 1 tablet (25 mg total) by mouth daily. 03/19/17   Biagio Borg, MD  HYDROcodone-homatropine [facility]) 5-1.5 MG/5ML syrup Take 5 mLs by mouth every 8 (eight) hours as needed for cough. 09/27/17   Hoyt Koch, MD  losartan (COZAAR) 50 MG tablet Take 1 tablet (50 mg total) by mouth daily. 03/19/17    Biagio Borg, MD  NIFEdipine (PROCARDIA XL/ADALAT-CC) 90 MG 24 hr tablet TAKE 1 TABLET (90 MG TOTAL) BY MOUTH DAILY. 03/19/17   Biagio Borg, MD    Physical Exam: Vitals:   09/30/17 0913 09/30/17 1240 09/30/17 1413 09/30/17 1500  BP:  (!) 143/72 (!) 162/61 (!) 144/65  Pulse:  77 86 82  Resp:  18 18 16   Temp:  97.6 F (36.4 C)    TempSrc:  Oral    SpO2:  95% 93% 92%  Weight: 69.9 kg (154 lb)     Height: 5\' 2"  (1.575 m)        General:  Appears calm and comfortable Sitting up in bed in no acute distress Eyes:  PERRL, EOMI, normal lids, iris ENT:  grossly normal hearing, lips & tongue, his membranes of her mouth are pink and moist Neck:  no LAD, masses or thyromegaly Cardiovascular:  RRR, no m/r/g. No LE edema.  Respiratory:  Mild to moderate increased work of breathing with conversation. Respirations somewhat shallow and "tight" appearing. Breath sounds with diminished airflow rhonchi throughout fine crackles faint end expiratory wheezing Abdomen:  soft, ntnd, positive bowel sounds no guarding or rebounding Skin:  no rash or induration seen on limited exam Musculoskeletal:  grossly normal tone BUE/BLE, good ROM, no bony abnormality Psychiatric:  grossly normal mood and affect, speech fluent and appropriate, AOx3 Neurologic:  CN 2-12 grossly intact, moves all extremities in coordinated fashion, sensation intact  Labs on Admission: I have personally reviewed following labs and imaging studies  CBC:  Recent Labs Lab 09/30/17 1328  WBC 19.2*  HGB 12.0  HCT 38.1  MCV 75.1*  PLT 672*   Basic Metabolic Panel:  Recent Labs Lab 09/30/17 1328  NA 131*  K 2.7*  CL 94*  CO2 22  GLUCOSE 101*  BUN 30*  CREATININE 1.75*  CALCIUM 9.1   GFR: Estimated Creatinine Clearance: 26.2 mL/min (A) (by C-G formula based on SCr of 1.75 mg/dL (H)). Liver Function Tests: No results for input(s): AST, ALT, ALKPHOS, BILITOT, PROT, ALBUMIN in the last 168 hours. No results for input(s):  LIPASE, AMYLASE in the last 168 hours. No results for input(s): AMMONIA in the last 168 hours. Coagulation Profile: No results for input(s): INR, PROTIME in the last 168 hours. Cardiac Enzymes: No results for input(s): CKTOTAL, CKMB, CKMBINDEX, TROPONINI in the last 168 hours. BNP (last 3 results) No results for input(s): PROBNP in the last 8760 hours. HbA1C: No results for input(s): HGBA1C in the last 72 hours. CBG: No results for input(s): GLUCAP in the last 168 hours. Lipid Profile: No results for input(s): CHOL, HDL, LDLCALC, TRIG, CHOLHDL, LDLDIRECT in the last 72 hours. Thyroid Function Tests: No results for input(s): TSH, T4TOTAL, FREET4, T3FREE, THYROIDAB in the last 72 hours. Anemia Panel: No results for input(s): VITAMINB12, FOLATE, FERRITIN, TIBC, IRON, RETICCTPCT in the last 72 hours. Urine analysis:    Component Value  Date/Time   COLORURINE YELLOW 03/19/2017 1038   APPEARANCEUR CLEAR 03/19/2017 1038   LABSPEC >=1.030 (A) 03/19/2017 1038   PHURINE 5.5 03/19/2017 1038   GLUCOSEU NEGATIVE 03/19/2017 1038   HGBUR NEGATIVE 03/19/2017 1038   BILIRUBINUR NEGATIVE 03/19/2017 1038   KETONESUR NEGATIVE 03/19/2017 1038   PROTEINUR 100 (A) 05/01/2013 0311   UROBILINOGEN 0.2 03/19/2017 1038   NITRITE NEGATIVE 03/19/2017 1038   LEUKOCYTESUR NEGATIVE 03/19/2017 1038    Creatinine Clearance: Estimated Creatinine Clearance: 26.2 mL/min (A) (by C-G formula based on SCr of 1.75 mg/dL (H)).  Sepsis Labs: @LABRCNTIP (procalcitonin:4,lacticidven:4) )No results found for this or any previous visit (from the past 240 hour(s)).   Radiological Exams on Admission: Dg Chest 2 View  Result Date: 09/30/2017 CLINICAL DATA:  Cough EXAM: CHEST  2 VIEW COMPARISON:  10/16/2015 FINDINGS: Cardiac shadow is stable. Aortic calcifications are noted. Diffuse interstitial changes are identified throughout both lungs. Acute lingular infiltrate is noted without sizable effusion. No bony abnormality is  noted. IMPRESSION: Acute lingular pneumonia. Electronically Signed   By: Inez Catalina M.D.   On: 09/30/2017 09:58    EKG:   Assessment/Plan Principal Problem:   Acute respiratory distress Active Problems:   Hypertension   PVD (peripheral vascular disease) (HCC)   Cigarette smoker   Hyperlipidemia   CAP (community acquired pneumonia)   Hyponatremia   CKD (chronic kidney disease), stage II   Hypokalemia   Anemia, unspecified   COPD  GOLD 0 / active smoker    #1. Acute respiratory distress likely related to community-acquired pneumonia in the setting of a mild COPD exacerbation. Chest x-ray with acute lingular pneumonia. WBCs 19.2. Oxygen saturation level greater than 90% on room air -Admit to telemetry - Oxygen supplementation as indicated -Monitor oxygen saturation level -Scheduled nebulizers -Solu-Medrol -Antibiotics for community-acquired pneumonia -Wean oxygen as able  #2. Community-acquired pneumonia. Chest x-ray as noted above. See #1. Patient was on 3 days of outpatient oral antibiotics. Failed oral antibiotics. WBC 19.2, afebrile lactic acid pending nontoxic appearing -Azithromycin and Rocephin -Obtain blood cultures -Obtain sputum culture as able -Gentle IV fluids -Monitor  #3. COPD. Mild exacerbation. Chest x-ray as noted above. No hypoxia. Continues to smoke. -Nebulizers -Solu-Medrol -See #1 -Outpatient follow-up. Will benefit from outpatient pulmonary  #4. Hypokalemia. Potassium 2.7. Likely related decreased oral intake in the setting of hydrochlorothiazide. -Replete -Check mag level -Recheck in the morning  #5. Hypertension. [place] control emergency department. Home medications include Cozaar hydrochlorothiazide Procardia -Continue Procardia -Monitor -Resume  #6. Chronic kidney disease. Stage II. It appears baseline creatinine 1.3. Creatinine 1.75 on admission. -Gentle IV fluids -Hold nephrotoxins -Monitor urine output -If no improvement consider a  renal ultrasound  #7. Tobacco use -Cessation counseling offered     DVT prophylaxis: lovenox  Code Status: full  Family Communication: none present  Disposition Plan: home  Consults called: none  Admission status: obs    Dyanne Carrel M MD [facility]  If 7PM-7AM, please contact night-coverage [email or web address] Password [email or web address]  09/30/2017, 3:31 PM

## 2017-09-30 NOTE — ED Triage Notes (Signed)
Per Pt, Pt is coming from home with painful cough and congestion x 1 week. Pt has been getting yellow sputum with cough and some rhonchi noted with assessment.

## 2017-09-30 NOTE — Telephone Encounter (Signed)
Patient Name: Elaine Turner DOB: January 29, 1946 Initial Comment Caller needs a new medication, was in the office Monday isn't feeling any better. Cough, chills, sore throat, light headed, phlegm in throat, runny nose. Nurse Assessment Nurse: Vallery Sa, RN, [place] Date/Time (Eastern Time): 09/30/2017 8:05:59 AM Confirm and document reason for call. If symptomatic, describe symptoms. ---Phineas Semen states she was started on antibiotics 3 days ago for URI and she developed shortness of breath two days ago that is present this morning. No blueness around her lips. No chest pain. Alert and responsive. Does the patient have any new or worsening symptoms? ---Yes Will a triage be completed? ---Yes Related visit to physician within the last 2 weeks? ---Yes Does the PT have any chronic conditions? (i.e. diabetes, asthma, etc.) ---Yes List chronic conditions. ---URI, High Blood Pressure Is this a behavioral health or substance abuse call? ---No Guidelines Guideline Title Affirmed Question Affirmed Notes Breathing Difficulty [1] MODERATE difficulty breathing (e.g., speaks in phrases, SOB even at rest, pulse 100-120) AND [2] NEW-onset or WORSE than normal Final Disposition User Go to ED Now Vallery Sa, RN, [facility] - ED Caller Disagree/Comply Comply Caller Understands Yes PreDisposition Call Doctor

## 2017-09-30 NOTE — ED Provider Notes (Addendum)
[name]   CSN: [id number] Arrival date & time: 09/30/17  0904     History   Chief Complaint Chief Complaint  Patient presents with  . Cough    HPI Elaine Turner is a 71 y.o. female.  Patient c/o recent fever, productive cough/greenish sputum, mild -mod sob, for the past week. Symptoms moderate, persistent. Was placed on abx 3 days ago, doxy, but feels progressively worse. No sore throat or runny nose. Denies chest pain. Is drinking fluids/eating without nvd. Denies known ill contacts. Denies hx asthma or copd. +smoker.    The history is provided by the patient.  Cough  Associated symptoms include shortness of breath. Pertinent negatives include no chest pain, no headaches, no sore throat and no eye redness.    Past Medical History:  Diagnosis Date  . Arthritis   . Chronic diarrhea   . COPD  GOLD 0 / active smoker  10/24/2015  . Heart murmur   . Hemorrhoids   . Hyperglycemia 05/01/2013  . Hypertension   . Pneumonia 05/01/2013  . PVD (peripheral vascular disease) ([place])   . Urine incontinence     Patient Active Problem List   Diagnosis Date Noted  . Cough 08/09/2017  . Weight loss 06/17/2017  . Constipation 06/17/2017  . Skin tag 03/19/2017  . Abnormal breath sounds 03/25/2016  . COPD  GOLD 0 / active smoker  10/24/2015  . Solitary pulmonary nodule 05/31/2015  . Impaired glucose tolerance 04/13/2014  . Left knee pain 02/09/2014  . Primary localized osteoarthrosis, lower leg 02/09/2014  . Anemia, unspecified 05/15/2013  . Hemorrhoids 05/02/2013  . CKD (chronic kidney disease), stage II 05/01/2013  . Hyperglycemia 05/01/2013  . Atherosclerosis of native arteries of the extremities with intermittent claudication 10/14/2012  . Osteoarthritis 09/08/2012  . Cigarette smoker 09/08/2012  . Insomnia 09/08/2012  . Hyperlipidemia 09/08/2012  . Heart murmur, aortic 09/08/2012  . Preventative health care 09/02/2012  . Hypertension   . PVD  (peripheral vascular disease) ([place])     Past Surgical History:  Procedure Laterality Date  . ABDOMINAL HYSTERECTOMY    . HEMORRHOID SURGERY N/A 06/26/2015   Procedure: INTERNAL AND EXTERNAL HEMORRHOIDECTOMY;  Surgeon: Georganna Skeans, MD;  Location: [place];  Service: General;  Laterality: N/A;    OB History    No data available       Home Medications    Prior to Admission medications   Medication Sig Start Date End Date Taking? Authorizing Provider  aspirin 81 MG EC tablet Take 1 tablet (81 mg total) by mouth daily. Swallow whole. 03/19/17   Biagio Borg, MD  benzonatate (TESSALON) 100 MG capsule Take 1 capsule (100 mg total) by mouth 2 (two) times daily as needed for cough. 08/09/17   Rosemarie Ax, MD  doxycycline (VIBRA-TABS) 100 MG tablet Take 1 tablet (100 mg total) by mouth 2 (two) times daily. 09/27/17   Hoyt Koch, MD  hydrochlorothiazide (HYDRODIURIL) 25 MG tablet Take 1 tablet (25 mg total) by mouth daily. 03/19/17   Biagio Borg, MD  HYDROcodone-homatropine [facility]) 5-1.5 MG/5ML syrup Take 5 mLs by mouth every 8 (eight) hours as needed for cough. 09/27/17   Hoyt Koch, MD  losartan (COZAAR) 50 MG tablet Take 1 tablet (50 mg total) by mouth daily. 03/19/17   Biagio Borg, MD  NIFEdipine (PROCARDIA XL/ADALAT-CC) 90 MG 24 hr tablet TAKE 1 TABLET (90 MG TOTAL) BY MOUTH DAILY. 03/19/17   Cathlean Cower  W, MD    Family History No family history on file.  Social History Social History  Substance Use Topics  . Smoking status: Current Every Day Smoker    Packs/day: 1.50    Years: 58.00    Types: Cigarettes  . Smokeless tobacco: Never Used  . Alcohol use 0.6 oz/week    1 Glasses of wine per week     Comment: occassional     Allergies   Ace inhibitors and Levaquin [levofloxacin]   Review of Systems Review of Systems  Constitutional: Positive for fever.  HENT: Negative for sore throat.   Eyes: Negative for redness.    Respiratory: Positive for cough and shortness of breath.   Cardiovascular: Negative for chest pain.  Gastrointestinal: Negative for abdominal pain.  Genitourinary: Negative for flank pain.  Musculoskeletal: Negative for back pain and neck pain.  Skin: Negative for rash.  Neurological: Negative for headaches.  Hematological: Does not bruise/bleed easily.  Psychiatric/Behavioral: Negative for confusion.     Physical Exam Updated Vital Signs BP (!) 143/72 (BP Location: Right Arm)   Pulse 77   Temp 97.6 F (36.4 C) (Oral)   Resp 18   Ht 1.575 m (5\' 2" )   Wt 69.9 kg (154 lb)   SpO2 95%   BMI 28.17 kg/m   Physical Exam  Constitutional: She appears well-developed and well-nourished. No distress.  HENT:  Mouth/Throat: Oropharynx is clear and moist.  Eyes: Conjunctivae are normal. No scleral icterus.  Neck: Neck supple. No tracheal deviation present.  Cardiovascular: Normal rate, regular rhythm, normal heart sounds and intact distal pulses.  Exam reveals no gallop and no friction rub.   No murmur heard. Pulmonary/Chest: Effort normal. No respiratory distress. She has rales.  Left, rales  Abdominal: Soft. Normal appearance and bowel sounds are normal. She exhibits no distension. There is no tenderness.  Genitourinary:  Genitourinary Comments: No cva tenderness  Musculoskeletal: She exhibits no edema.  Neurological: She is alert.  Skin: Skin is warm and dry. No rash noted. She is not diaphoretic.  Psychiatric: She has a normal mood and affect.  Nursing note and vitals reviewed.    ED Treatments / Results  Labs (all labs ordered are listed, but only abnormal results are displayed) Results for orders placed or performed during the hospital encounter of 09/30/17  CBC  Result Value Ref Range   WBC 19.2 (H) 4.0 - 10.5 K/uL   RBC 5.07 3.87 - 5.11 MIL/uL   Hemoglobin 12.0 12.0 - 15.0 g/dL   HCT 38.1 36.0 - 46.0 %   MCV 75.1 (L) 78.0 - 100.0 fL   MCH 23.7 (L) 26.0 - 34.0 pg    MCHC 31.5 30.0 - 36.0 g/dL   RDW 15.1 11.5 - 15.5 %   Platelets 489 (H) 150 - 400 K/uL  Basic metabolic panel  Result Value Ref Range   Sodium 131 (L) 135 - 145 mmol/L   Potassium 2.7 (LL) 3.5 - 5.1 mmol/L   Chloride 94 (L) 101 - 111 mmol/L   CO2 22 22 - 32 mmol/L   Glucose, Bld 101 (H) 65 - 99 mg/dL   BUN 30 (H) 6 - 20 mg/dL   Creatinine, Ser 1.75 (H) 0.44 - 1.00 mg/dL   Calcium 9.1 8.9 - 10.3 mg/dL   GFR calc non Af Amer 28 (L) >60 mL/min   GFR calc Af Amer 32 (L) >60 mL/min   Anion gap 15 5 - 15   Dg Chest 2 View  Result  Date: 09/30/2017 CLINICAL DATA:  Cough EXAM: CHEST  2 VIEW COMPARISON:  10/16/2015 FINDINGS: Cardiac shadow is stable. Aortic calcifications are noted. Diffuse interstitial changes are identified throughout both lungs. Acute lingular infiltrate is noted without sizable effusion. No bony abnormality is noted. IMPRESSION: Acute lingular pneumonia. Electronically Signed   By: Inez Catalina M.D.   On: 09/30/2017 09:58    EKG  EKG Interpretation None       Radiology Dg Chest 2 View  Result Date: 09/30/2017 CLINICAL DATA:  Cough EXAM: CHEST  2 VIEW COMPARISON:  10/16/2015 FINDINGS: Cardiac shadow is stable. Aortic calcifications are noted. Diffuse interstitial changes are identified throughout both lungs. Acute lingular infiltrate is noted without sizable effusion. No bony abnormality is noted. IMPRESSION: Acute lingular pneumonia. Electronically Signed   By: Inez Catalina M.D.   On: 09/30/2017 09:58    Procedures Procedures (including critical care time)  Medications Ordered in ED Medications  cefTRIAXone (ROCEPHIN) 1 g in dextrose 5 % 50 mL IVPB (not administered)  azithromycin (ZITHROMAX) 500 mg in dextrose 5 % 250 mL IVPB (not administered)     Initial Impression / Assessment and Plan / ED Course  I have reviewed the triage vital signs and the nursing notes.  Pertinent labs & imaging results that were available during my care of the patient were  reviewed by me and considered in my medical decision making (see chart for details).  Iv ns. Labs. Cxr.  Rocephin and zithromax iv.  Reviewed nursing notes and prior charts for additional history.   Pt feels worse despite outpt abx x 3 days.  Pt w aki, elevated wbc, and pna on cxr.  Hospitalists consulted for admission.      Final Clinical Impressions(s) / ED Diagnoses   Final diagnoses:  None    New Prescriptions New Prescriptions   No medications on file         Lajean Saver, MD 09/30/17 1446

## 2017-09-30 NOTE — ED Notes (Signed)
Got patient undress on the monitor patient is resting with family at bedside 

## 2017-10-01 DIAGNOSIS — J189 Pneumonia, unspecified organism: Secondary | ICD-10-CM | POA: Diagnosis not present

## 2017-10-01 DIAGNOSIS — N179 Acute kidney failure, unspecified: Secondary | ICD-10-CM | POA: Diagnosis not present

## 2017-10-01 DIAGNOSIS — J441 Chronic obstructive pulmonary disease with (acute) exacerbation: Secondary | ICD-10-CM | POA: Diagnosis not present

## 2017-10-01 DIAGNOSIS — R0603 Acute respiratory distress: Secondary | ICD-10-CM

## 2017-10-01 LAB — CBC
HCT: 36.6 % (ref 36.0–46.0)
Hemoglobin: 11.8 g/dL — ABNORMAL LOW (ref 12.0–15.0)
MCH: 24.2 pg — ABNORMAL LOW (ref 26.0–34.0)
MCHC: 32.2 g/dL (ref 30.0–36.0)
MCV: 75 fL — ABNORMAL LOW (ref 78.0–100.0)
Platelets: 510 10*3/uL — ABNORMAL HIGH (ref 150–400)
RBC: 4.88 MIL/uL (ref 3.87–5.11)
RDW: 14.7 % (ref 11.5–15.5)
WBC: 12.6 10*3/uL — ABNORMAL HIGH (ref 4.0–10.5)

## 2017-10-01 LAB — BASIC METABOLIC PANEL
Anion gap: 10 (ref 5–15)
BUN: 26 mg/dL — ABNORMAL HIGH (ref 6–20)
CO2: 24 mmol/L (ref 22–32)
Calcium: 9 mg/dL (ref 8.9–10.3)
Chloride: 98 mmol/L — ABNORMAL LOW (ref 101–111)
Creatinine, Ser: 1.44 mg/dL — ABNORMAL HIGH (ref 0.44–1.00)
GFR calc Af Amer: 41 mL/min — ABNORMAL LOW (ref >60)
GFR calc non Af Amer: 35 mL/min — ABNORMAL LOW (ref >60)
Glucose, Bld: 167 mg/dL — ABNORMAL HIGH (ref 65–99)
Potassium: 3.3 mmol/L — ABNORMAL LOW (ref 3.5–5.1)
Sodium: 132 mmol/L — ABNORMAL LOW (ref 135–145)

## 2017-10-01 MED ORDER — CEFPODOXIME PROXETIL 200 MG PO TABS: 200.0000 mg | ORAL_TABLET | Freq: Two times a day (BID) | ORAL | 0 refills | Status: AC

## 2017-10-01 MED ORDER — ALBUTEROL SULFATE HFA 108 (90 BASE) MCG/ACT IN AERS: 2.0000 | INHALATION_SPRAY | Freq: Four times a day (QID) | RESPIRATORY_TRACT | 2 refills | Status: DC | PRN

## 2017-10-01 MED ORDER — AZITHROMYCIN 500 MG PO TABS: 500.0000 mg | ORAL_TABLET | Freq: Every day | ORAL | 0 refills | Status: AC

## 2017-10-01 MED ORDER — POTASSIUM CHLORIDE CRYS ER 20 MEQ PO TBCR
40.0000 meq | EXTENDED_RELEASE_TABLET | Freq: Once | ORAL | Status: AC
  Administered 2017-10-01: 40 meq via ORAL
  Filled 2017-10-01: qty 2

## 2017-10-01 MED ORDER — PREDNISONE 20 MG PO TABS
40.0000 mg | ORAL_TABLET | Freq: Every day | ORAL | 0 refills | Status: AC
Start: 2017-10-02 — End: 2017-10-05

## 2017-10-01 NOTE — Discharge Summary (Signed)
Physician Discharge Summary  Elaine Turner MEQ:[id number] DOB: June 12, 1946 DOA: 09/30/2017  PCP: Elaine Borg, MD  Admit date: 09/30/2017 Discharge date: 10/01/2017  Admitted From: Home Disposition: Home  Recommendations for Outpatient Follow-up:  1. Follow up with PCP in 1 week 2. Please obtain BMP/CBC in one week 3. Please follow up on the following pending results: Blood cultures  Home Health: None Equipment/Devices: None  Discharge Condition: Stable CODE STATUS: Full code Diet recommendation: Heart healthy   Brief/Interim Summary:  Admission HPI written by Elaine Dickens, MD   Chief Complaint: coug[h] HPI: Elaine Turner is a very pleasant 71 y.o. female with medical history significant for COPD, hyperglycemia, hypertension, peripheral vascular disease presents to the emergency department with a persistent worsening cough. Initial evaluation in the emergency department includes chest x-ray concerning for pneumonia  Information is obtained from the patient. She reports she's had a persistent nonproductive cough for "several weeks. Over the last week cough has worsened become productive and "wet sounding". She saw primary care provider 3 days ago was placed on doxycycline. She feels that the antibiotic "is no good". She continues to feel worse specifically more coughing more sputum production worsening shortness of breath. She denies headache dizziness syncope or near-syncope. She denies chest pain palpitations lower extremity edema or orthopnea. She states she is able to drink fluids but is not eating very much as she has no appetite. She denies abdominal pain nausea vomiting diarrhea. She denies dysuria hematuria frequency or urgency. She denies fever chills recent travel or sick contacts. She does admit to continuing to smoke    Hospital course:  Acute respiratory distress Secondary to a combination of COPD exacerbation and pneumonia. Did not require oxygen. Improved with  management of below problems  Community acquired pneumonia Started on ceftriaxone and azithromycin. Transitioned to Vantin and azithromycin to complete a 7 day course. Blood cultures pending.  COPD exacerbation Started on solu-medrol and nebulizer treatments as needed. Albuterol prescribed at discharge.  Hypokalemia Repleted.  Essential hypertension Continued Procardia, losartan and hydrochlorothiazide.  Acute kidney injury on chronic kidney disease, stage III Baseline of 1.4. 1.75 on admission and improved with hydration  Tobacco use Counseled on admission.  Discharge Diagnoses:  Principal Problem:   COPD with acute exacerbation ([place]) Active Problems:   Hypertension   PVD (peripheral vascular disease) ([place])   Cigarette smoker   Hyperlipidemia   CAP (community acquired pneumonia)   Hyponatremia   CKD (chronic kidney disease), stage II   Hypokalemia   Anemia, unspecified   COPD  GOLD 0 / active smoker    Acute respiratory distress   AKI (acute kidney injury) [facility])    Discharge Instructions  Discharge Instructions    Call MD for:  difficulty breathing, headache or visual disturbances    Complete by:  As directed    Call MD for:  extreme fatigue    Complete by:  As directed    Call MD for:  persistant dizziness or light-headedness    Complete by:  As directed    Call MD for:  temperature >100.4    Complete by:  As directed    Diet - low sodium heart healthy    Complete by:  As directed    Increase activity slowly    Complete by:  As directed      Allergies as of 10/01/2017      Reactions   Ace Inhibitors Cough   Levaquin [levofloxacin] Other (See Comments)   GI upset  Medication List    TAKE these medications   albuterol 108 (90 Base) MCG/ACT inhaler Commonly known as:  PROVENTIL HFA;VENTOLIN HFA Inhale 2 puffs into the lungs every 6 (six) hours as needed for wheezing or shortness of breath.   aspirin 81 MG EC tablet Take 1 tablet (81 mg total)  by mouth daily. Swallow whole.   azithromycin 500 MG tablet Commonly known as:  ZITHROMAX Take 1 tablet (500 mg total) by mouth daily.   benzonatate 100 MG capsule Commonly known as:  TESSALON Take 1 capsule (100 mg total) by mouth 2 (two) times daily as needed for cough.   cefpodoxime 200 MG tablet Commonly known as:  VANTIN Take 1 tablet (200 mg total) by mouth 2 (two) times daily.   hydrochlorothiazide 25 MG tablet Commonly known as:  HYDRODIURIL Take 1 tablet (25 mg total) by mouth daily.   losartan 50 MG tablet Commonly known as:  COZAAR Take 1 tablet (50 mg total) by mouth daily.   NIFEdipine 90 MG 24 hr tablet Commonly known as:  PROCARDIA XL/ADALAT-CC TAKE 1 TABLET (90 MG TOTAL) BY MOUTH DAILY.   predniSONE 20 MG tablet Commonly known as:  DELTASONE Take 2 tablets (40 mg total) by mouth daily with breakfast.      Follow-up Information    Elaine Borg, MD. Schedule an appointment as soon as possible for a visit in 1 week(s).   Specialties:  Internal Medicine, Radiology Contact information: [address] [place] [address] [phone]          Allergies  Allergen Reactions  . Ace Inhibitors Cough  . Levaquin [Levofloxacin] Other (See Comments)    GI upset    Consultations:  None   Procedures/Studies: Dg Chest 2 View  Result Date: 09/30/2017 CLINICAL DATA:  Cough EXAM: CHEST  2 VIEW COMPARISON:  10/16/2015 FINDINGS: Cardiac shadow is stable. Aortic calcifications are noted. Diffuse interstitial changes are identified throughout both lungs. Acute lingular infiltrate is noted without sizable effusion. No bony abnormality is noted. IMPRESSION: Acute lingular pneumonia. Electronically Signed   By: Inez Catalina M.D.   On: 09/30/2017 09:58      Subjective: No dyspnea.  Discharge Exam: Vitals:   10/01/17 0839 10/01/17 1007  BP:  (!) 134/58  Pulse:  79  Resp:  18  Temp:  97.8 F (36.6 C)  SpO2: 96% 100%   Vitals:   10/01/17 0730  10/01/17 0836 10/01/17 0839 10/01/17 1007  BP: (!) 109/55 (!) 133/56  (!) 134/58  Pulse: 62 74  79  Resp: 18 18  18   Temp: 97.6 F (36.4 C) 97.7 F (36.5 C)  97.8 F (36.6 C)  TempSrc: Oral Oral  Oral  SpO2: 96% 98% 96% 100%  Weight:      Height:        General: Pt is alert, awake, not in acute distress Cardiovascular: RRR, S1/S2 +, no rubs, no gallops Respiratory: Mild diffuse crackles, no wheezing, no rhonchi Abdominal: Soft, NT, ND, bowel sounds + Extremities: no edema, no cyanosis    The results of significant diagnostics from this hospitalization (including imaging, microbiology, ancillary and laboratory) are listed below for reference.     Microbiology: No results found for this or any previous visit (from the past 240 hour(s)).   Labs: BNP (last 3 results) No results for input(s): BNP in the last 8760 hours. Basic Metabolic Panel:  Recent Labs Lab 09/30/17 1328 09/30/17 1720 10/01/17 0348  NA 131*  --  132*  K 2.7*  --  3.3*  CL 94*  --  98*  CO2 22  --  24  GLUCOSE 101*  --  167*  BUN 30*  --  26*  CREATININE 1.75* 1.60* 1.44*  CALCIUM 9.1  --  9.0  MG  --  1.7  --    Liver Function Tests: No results for input(s): AST, ALT, ALKPHOS, BILITOT, PROT, ALBUMIN in the last 168 hours. No results for input(s): LIPASE, AMYLASE in the last 168 hours. No results for input(s): AMMONIA in the last 168 hours. CBC:  Recent Labs Lab 09/30/17 1328 10/01/17 0348  WBC 19.2* 12.6*  HGB 12.0 11.8*  HCT 38.1 36.6  MCV 75.1* 75.0*  PLT 489* 510*   Cardiac Enzymes: No results for input(s): CKTOTAL, CKMB, CKMBINDEX, TROPONINI in the last 168 hours. BNP: Invalid input(s): POCBNP CBG: No results for input(s): GLUCAP in the last 168 hours. D-Dimer No results for input(s): DDIMER in the last 72 hours. Hgb A1c No results for input(s): HGBA1C in the last 72 hours. Lipid Profile No results for input(s): CHOL, HDL, LDLCALC, TRIG, CHOLHDL, LDLDIRECT in the last 72  hours. Thyroid function studies No results for input(s): TSH, T4TOTAL, T3FREE, THYROIDAB in the last 72 hours.  Invalid input(s): FREET3 Anemia work up No results for input(s): VITAMINB12, FOLATE, FERRITIN, TIBC, IRON, RETICCTPCT in the last 72 hours. Urinalysis    Component Value Date/Time   COLORURINE STRAW (A) 09/30/2017 1600   APPEARANCEUR CLEAR 09/30/2017 1600   LABSPEC 1.004 (L) 09/30/2017 1600   PHURINE 6.0 09/30/2017 1600   GLUCOSEU NEGATIVE 09/30/2017 1600   GLUCOSEU NEGATIVE 03/19/2017 1038   HGBUR NEGATIVE 09/30/2017 1600   BILIRUBINUR NEGATIVE 09/30/2017 1600   KETONESUR NEGATIVE 09/30/2017 1600   PROTEINUR NEGATIVE 09/30/2017 1600   UROBILINOGEN 0.2 03/19/2017 1038   NITRITE NEGATIVE 09/30/2017 1600   LEUKOCYTESUR NEGATIVE 09/30/2017 1600   Sepsis Labs Invalid input(s): PROCALCITONIN,  WBC,  LACTICIDVEN Microbiology No results found for this or any previous visit (from the past 240 hour(s)).   SIGNED:   Cordelia Poche, MD [facility] 10/01/2017, 1:51 PM Pager [phone]  If 7PM-7AM, please contact night-coverage [email or web address] Password [email or web address]

## 2017-10-01 NOTE — Discharge Instructions (Signed)
Chronic Obstructive Pulmonary Disease Exacerbation Chronic obstructive pulmonary disease (COPD) is a common lung condition in which airflow from the lungs is limited. COPD is a general term that can be used to describe many different lung problems that limit airflow, including chronic bronchitis and emphysema. COPD exacerbations are episodes when breathing symptoms become much worse and require extra treatment. Without treatment, COPD exacerbations can be life threatening, and frequent COPD exacerbations can cause further damage to your lungs. What are the causes?  Respiratory infections.  Exposure to smoke.  Exposure to air pollution, chemical fumes, or dust. Sometimes there is no apparent cause or trigger. What increases the risk?  Smoking cigarettes.  Older age.  Frequent prior COPD exacerbations. What are the signs or symptoms?  Increased coughing.  Increased thick spit (sputum) production.  Increased wheezing.  Increased shortness of breath.  Rapid breathing.  Chest tightness. How is this diagnosed? Your medical history, a physical exam, and tests will help your health care provider make a diagnosis. Tests may include:  A chest X-ray.  Basic lab tests.  Sputum testing.  An arterial blood gas test.  How is this treated? Depending on the severity of your COPD exacerbation, you may need to be admitted to a hospital for treatment. Some of the treatments commonly used to treat COPD exacerbations are:  Antibiotic medicines.  Bronchodilators. These are drugs that expand the air passages. They may be given with an inhaler or nebulizer. Spacer devices may be needed to help improve drug delivery.  Corticosteroid medicines.  Supplemental oxygen therapy.  Airway clearing techniques, such as noninvasive ventilation (NIV) and positive expiratory pressure (PEP). These provide respiratory support through a mask or other noninvasive device.  Follow these instructions at  home:  Do not smoke. Quitting smoking is very important to prevent COPD from getting worse and exacerbations from happening as often.  Avoid exposure to all substances that irritate the airway, especially to tobacco smoke.  If you were prescribed an antibiotic medicine, finish it all even if you start to feel better.  Take all medicines as directed by your health care provider.It is important to use correct technique with inhaled medicines.  Drink enough fluids to keep your urine clear or pale yellow (unless you have a medical condition that requires fluid restriction).  Use a cool mist vaporizer. This makes it easier to clear your chest when you cough.  If you have a home nebulizer and oxygen, continue to use them as directed.  Maintain all necessary vaccinations to prevent infections.  Exercise regularly.  Eat a healthy diet.  Keep all follow-up appointments as directed by your health care provider. Get help right away if:  You have worsening shortness of breath.  You have trouble talking.  You have severe chest pain.  You have blood in your sputum.  You have a fever.  You have weakness, vomit repeatedly, or faint.  You feel confused.  You continue to get worse. This information is not intended to replace advice given to you by your health care provider. Make sure you discuss any questions you have with your health care provider. Document Released: 10/11/2007 Document Revised: 05/21/2016 Document Reviewed: 08/18/2013 Elsevier Interactive Patient Education  2017 [place] Pneumonia, Adult Pneumonia is an infection of the lungs. One type of pneumonia can happen while a person is in a hospital. A different type can happen when a person is not in a hospital (community-acquired pneumonia). It is easy for this kind to  spread from person to person. It can spread to you if you breathe near an infected person who coughs or sneezes. Some symptoms  include:  A dry cough.  A wet (productive) cough.  Fever.  Sweating.  Chest pain.  Follow these instructions at home:  Take over-the-counter and prescription medicines only as told by your doctor. ? Only take cough medicine if you are losing sleep. ? If you were prescribed an antibiotic medicine, take it as told by your doctor. Do not stop taking the antibiotic even if you start to feel better.  Sleep with your head and neck raised (elevated). You can do this by putting a few pillows under your head, or you can sleep in a recliner.  Do not use tobacco products. These include cigarettes, chewing tobacco, and e-cigarettes. If you need help quitting, ask your doctor.  Drink enough water to keep your pee (urine) clear or pale yellow. A shot (vaccine) can help prevent pneumonia. Shots are often suggested for:  People older than 73 years of age.  People older than 73 years of age: ? Who are having cancer treatment. ? Who have long-term (chronic) lung disease. ? Who have problems with their body's defense system (immune system).  You may also prevent pneumonia if you take these actions:  Get the flu (influenza) shot every year.  Go to the dentist as often as told.  Wash your hands often. If soap and water are not available, use hand sanitizer.  Contact a doctor if:  You have a fever.  You lose sleep because your cough medicine does not help. Get help right away if:  You are short of breath and it gets worse.  You have more chest pain.  Your sickness gets worse. This is very serious if: ? You are an older adult. ? Your body's defense system is weak.  You cough up blood. This information is not intended to replace advice given to you by your health care provider. Make sure you discuss any questions you have with your health care provider. Document Released: 06/01/2008 Document Revised: 05/21/2016 Document Reviewed: 04/10/2015 Elsevier Interactive Patient Education   [employer].

## 2017-10-01 NOTE — Care Management Obs Status (Signed)
[facility] NOTIFICATION   Patient Details  Name: Elaine Turner MRN: [id number] Date of Birth: 02/12/1946   Medicare Observation Status Notification Given:  Yes    Kevia Zaucha, Rory Percy, RN 10/01/2017, 12:38 PM

## 2017-10-01 NOTE — Progress Notes (Signed)
Patient for discharge home accompanied by her daughter. No apparent distress noted. Medications and discharge instructions explained to the patient. She verbalized understanding. Copies given to her including original prescriptions. IV saline lock and tele pack removed. CCMD notified.

## 2017-10-01 NOTE — Care Management Note (Signed)
Case Management Note  Patient Details  Name: Elaine Turner MRN: [id number] Date of Birth: Apr 19, 1946  Subjective/Objective:      CM following for progression and d/c planning.               Action/Plan: 09/30/2017 Met with pt to discuss OBS status to which she verbalized understanding. Pt independent, moving about in the room. Will follow for d/c needs.   Expected Discharge Date:  10/01/17               Expected Discharge Plan:  Home/Self Care  In-House Referral:     Discharge planning Services     Post Acute Care Choice:    Choice offered to:     DME Arranged:    DME Agency:     HH Arranged:    HH Agency:     Status of Service:  In process, will continue to follow  If discussed at Long Length of Stay Meetings, dates discussed:    Additional Comments:  Adron Bene, RN 10/01/2017, 1:00 PM

## 2017-10-05 LAB — CULTURE, BLOOD (ROUTINE X 2)
Culture: NO GROWTH
Culture: NO GROWTH
Special Requests: ADEQUATE

## 2017-10-06 ENCOUNTER — Other Ambulatory Visit (INDEPENDENT_AMBULATORY_CARE_PROVIDER_SITE_OTHER): Payer: Medicare Other

## 2017-10-06 ENCOUNTER — Encounter: Payer: Self-pay | Admitting: Internal Medicine

## 2017-10-06 ENCOUNTER — Ambulatory Visit (INDEPENDENT_AMBULATORY_CARE_PROVIDER_SITE_OTHER): Payer: Medicare Other | Admitting: Internal Medicine

## 2017-10-06 VITALS — BP 160/80 | HR 62 | Temp 98.1°F | Ht 62.0 in | Wt 152.0 lb

## 2017-10-06 DIAGNOSIS — N179 Acute kidney failure, unspecified: Secondary | ICD-10-CM | POA: Diagnosis not present

## 2017-10-06 DIAGNOSIS — J441 Chronic obstructive pulmonary disease with (acute) exacerbation: Secondary | ICD-10-CM

## 2017-10-06 DIAGNOSIS — R911 Solitary pulmonary nodule: Secondary | ICD-10-CM | POA: Diagnosis not present

## 2017-10-06 DIAGNOSIS — E871 Hypo-osmolality and hyponatremia: Secondary | ICD-10-CM

## 2017-10-06 DIAGNOSIS — R634 Abnormal weight loss: Secondary | ICD-10-CM

## 2017-10-06 DIAGNOSIS — J189 Pneumonia, unspecified organism: Secondary | ICD-10-CM

## 2017-10-06 DIAGNOSIS — E876 Hypokalemia: Secondary | ICD-10-CM

## 2017-10-06 LAB — CBC WITH DIFFERENTIAL/PLATELET
Basophils Absolute: 0.1 10*3/uL (ref 0.0–0.1)
Basophils Relative: 0.7 % (ref 0.0–3.0)
Eosinophils Absolute: 0.2 10*3/uL (ref 0.0–0.7)
Eosinophils Relative: 1.2 % (ref 0.0–5.0)
HCT: 40.2 % (ref 36.0–46.0)
Hemoglobin: 12.5 g/dL (ref 12.0–15.0)
Lymphocytes Relative: 22.6 % (ref 12.0–46.0)
Lymphs Abs: 3.6 10*3/uL (ref 0.7–4.0)
MCHC: 31.2 g/dL (ref 30.0–36.0)
MCV: 77 fl — ABNORMAL LOW (ref 78.0–100.0)
Monocytes Absolute: 1 10*3/uL (ref 0.1–1.0)
Monocytes Relative: 6.3 % (ref 3.0–12.0)
Neutro Abs: 11 10*3/uL — ABNORMAL HIGH (ref 1.4–7.7)
Neutrophils Relative %: 69.2 % (ref 43.0–77.0)
Platelets: 714 10*3/uL — ABNORMAL HIGH (ref 150.0–400.0)
RBC: 5.22 Mil/uL — ABNORMAL HIGH (ref 3.87–5.11)
RDW: 15.7 % — ABNORMAL HIGH (ref 11.5–15.5)
WBC: 15.9 10*3/uL — ABNORMAL HIGH (ref 4.0–10.5)

## 2017-10-06 LAB — BASIC METABOLIC PANEL
BUN: 28 mg/dL — ABNORMAL HIGH (ref 6–23)
CO2: 30 mEq/L (ref 19–32)
Calcium: 9.6 mg/dL (ref 8.4–10.5)
Chloride: 97 mEq/L (ref 96–112)
Creatinine, Ser: 1.52 mg/dL — ABNORMAL HIGH (ref 0.40–1.20)
GFR: 43.04 mL/min — ABNORMAL LOW (ref >60.00)
Glucose, Bld: 89 mg/dL (ref 70–99)
Potassium: 3.4 mEq/L — ABNORMAL LOW (ref 3.5–5.1)
Sodium: 136 mEq/L (ref 135–145)

## 2017-10-06 MED ORDER — BENZONATATE 100 MG PO CAPS: 100.0000 mg | ORAL_CAPSULE | Freq: Two times a day (BID) | ORAL | 0 refills | Status: DC | PRN

## 2017-10-06 NOTE — Assessment & Plan Note (Signed)
Etiology unclear, has been longterm smoker since 71yo, less cigs per day in last few yrs but still smoking, exam o/w benign, for ct chest as above

## 2017-10-06 NOTE — Assessment & Plan Note (Signed)
Likely resolved, for f/u lab today

## 2017-10-06 NOTE — Patient Instructions (Signed)
Please continue all other medications as before, and refills have been done if requested.  Please have the pharmacy call with any other refills you may need.  Please continue your efforts at being more active, low cholesterol diet, and weight control.  You are otherwise up to date with prevention measures today.  Please keep your appointments with your specialists as you may have planned  You will be contacted regarding the referral for: CT scan of the chest  Please go to the LAB in the Basement (turn left off the elevator) for the tests to be done today  You will be contacted by phone if any changes need to be made immediately.  Otherwise, you will receive a letter about your results with an explanation, but please check with MyChart first.  Please remember to sign up for MyChart if you have not done so, as this will be important to you in the future with finding out test results, communicating by private email, and scheduling acute appointments online when needed.  Please return in 3 months, or sooner if needed

## 2017-10-06 NOTE — Assessment & Plan Note (Signed)
Symptomatically much improved, sats ok, still with abnormal exam, will ask for ct chest as above

## 2017-10-06 NOTE — Assessment & Plan Note (Signed)
Mild, for f/u lab today

## 2017-10-06 NOTE — Assessment & Plan Note (Signed)
Mild, for f/u lab today,  to f/u any worsening symptoms or concerns

## 2017-10-06 NOTE — Assessment & Plan Note (Signed)
Noted on prior CT chest, will asks for f/u CT chest but if not adequate exam to assess the prior nodule due to overlying pna, may need repeated

## 2017-10-06 NOTE — Assessment & Plan Note (Signed)
Improved,  to f/u any worsening symptoms or concerns 

## 2017-10-06 NOTE — Assessment & Plan Note (Signed)
Resolved, cont same tx 

## 2017-10-06 NOTE — Progress Notes (Signed)
Subjective:    Patient ID: Elaine Turner, female    DOB: 05/22/1946, 71 y.o.   MRN: [id number]  HPI  Here to f/u recent hospn with lingular pna and copd exacerbation, AKI, hypoNa and low K, now feeeling much improved but still feels somewhat weak and occasional nonprod cough.  Pt denies chest pain, increased sob or doe, wheezing, orthopnea, PND, increased LE swelling, palpitations, dizziness or syncope.  Pt denies new neurological symptoms such as new headache, or facial or extremity weakness or numbness   Pt denies polydipsia, polyuria.  States has wt loss of 20 lbs in past year, not happy with new ill fitting teeth and causes her lower appetite she believes.  Also had CT chest 2016 with lingular 9 mm nodule.   Past Medical History:  Diagnosis Date  . Arthritis   . Chronic diarrhea   . COPD  GOLD 0 / active smoker  10/24/2015  . Heart murmur   . Hemorrhoids   . Hyperglycemia 05/01/2013  . Hypertension   . Pneumonia 05/01/2013  . PVD (peripheral vascular disease) ([place])   . Urine incontinence    Past Surgical History:  Procedure Laterality Date  . ABDOMINAL HYSTERECTOMY    . HEMORRHOID SURGERY N/A 06/26/2015   Procedure: INTERNAL AND EXTERNAL HEMORRHOIDECTOMY;  Surgeon: Georganna Skeans, MD;  Location: [facility];  Service: General;  Laterality: N/A;    reports that she has been smoking Cigarettes.  She has a 87.00 pack-year smoking history. She has never used smokeless tobacco. She reports that she drinks about 0.6 oz of alcohol per week . She reports that she does not use drugs. family history includes Hypertension in her mother; Lung cancer in her father. Allergies  Allergen Reactions  . Ace Inhibitors Cough  . Levaquin [Levofloxacin] Other (See Comments)    GI upset   Current Outpatient Prescriptions on File Prior to Visit  Medication Sig Dispense Refill  . albuterol (PROVENTIL HFA;VENTOLIN HFA) 108 (90 Base) MCG/ACT inhaler Inhale 2 puffs into the lungs every 6  (six) hours as needed for wheezing or shortness of breath. 1 Inhaler 2  . aspirin 81 MG EC tablet Take 1 tablet (81 mg total) by mouth daily. Swallow whole. 30 tablet 12  . azithromycin (ZITHROMAX) 500 MG tablet Take 1 tablet (500 mg total) by mouth daily. 6 tablet 0  . cefpodoxime (VANTIN) 200 MG tablet Take 1 tablet (200 mg total) by mouth 2 (two) times daily. 12 tablet 0  . hydrochlorothiazide (HYDRODIURIL) 25 MG tablet Take 1 tablet (25 mg total) by mouth daily. 90 tablet 3  . losartan (COZAAR) 50 MG tablet Take 1 tablet (50 mg total) by mouth daily. 90 tablet 3  . NIFEdipine (PROCARDIA XL/ADALAT-CC) 90 MG 24 hr tablet TAKE 1 TABLET (90 MG TOTAL) BY MOUTH DAILY. 90 tablet 3   No current facility-administered medications on file prior to visit.     Review of Systems  Constitutional: Negative for other unusual diaphoresis or sweats HENT: Negative for ear discharge or swelling Eyes: Negative for other worsening visual disturbances Respiratory: Negative for stridor or other swelling  Gastrointestinal: Negative for worsening distension or other blood Genitourinary: Negative for retention or other urinary change Musculoskeletal: Negative for other MSK pain or swelling Skin: Negative for color change or other new lesions Neurological: Negative for worsening tremors and other numbness  Psychiatric/Behavioral: Negative for worsening agitation or other fatigue All other system neg per pt    Objective:   Physical Exam  BP (!) 160/80   Pulse 62   Temp 98.1 F (36.7 C) (Oral)   Ht 5\' 2"  (1.575 m)   Wt 152 lb (68.9 kg)   SpO2 96%   BMI 27.80 kg/m  VS noted,  Constitutional: Pt appears in NAD HENT: Head: NCAT.  Right Ear: External ear normal.  Left Ear: External ear normal.  Eyes: . Pupils are equal, round, and reactive to light. Conjunctivae and EOM are normal Nose: without d/c or deformity Neck: Neck supple. Gross normal ROM Cardiovascular: Normal rate and regular rhythm.     Pulmonary/Chest: Effort normal and breath sounds without wheezing but with somewhat surprising more than a few rales to lower half post left chest.  Neurological: Pt is alert. At baseline orientation, motor grossly intact Skin: Skin is warm. No rashes, other new lesions, no LE edema Psychiatric: Pt behavior is normal without agitation  No other exam findings  Oct 4 blood cx - no growth     Assessment & Plan:

## 2017-10-07 ENCOUNTER — Other Ambulatory Visit: Payer: Self-pay | Admitting: Internal Medicine

## 2017-10-07 ENCOUNTER — Telehealth: Payer: Self-pay

## 2017-10-07 ENCOUNTER — Encounter: Payer: Self-pay | Admitting: Internal Medicine

## 2017-10-07 DIAGNOSIS — J181 Lobar pneumonia, unspecified organism: Principal | ICD-10-CM

## 2017-10-07 DIAGNOSIS — J189 Pneumonia, unspecified organism: Secondary | ICD-10-CM

## 2017-10-07 NOTE — Telephone Encounter (Signed)
-----   Message from Biagio Borg, MD sent at 10/07/2017 12:20 PM EDT ----- Left message on MyChart, pt to cont same tx except  The test results show that your current treatment is OK, except the white blood cells are still mildly elevated, and the potassium is slightly low.  This should improve by itself but I would ask you to return to the LAB only on Monday Oct 11, 2017 for repeating the tests.  I will place the order, and you should hear from the office as well.  Mariafernanda Hendricksen to please inform pt, I will do orders

## 2017-10-07 NOTE — Telephone Encounter (Signed)
Pt has been notified and expressed understanding. Will come to the lab to have blood work redrawn on Monday.

## 2017-10-11 ENCOUNTER — Encounter: Payer: Self-pay | Admitting: Internal Medicine

## 2017-10-11 ENCOUNTER — Other Ambulatory Visit (INDEPENDENT_AMBULATORY_CARE_PROVIDER_SITE_OTHER): Payer: Medicare Other

## 2017-10-11 ENCOUNTER — Telehealth: Payer: Self-pay

## 2017-10-11 ENCOUNTER — Other Ambulatory Visit: Payer: Self-pay | Admitting: Internal Medicine

## 2017-10-11 DIAGNOSIS — D473 Essential (hemorrhagic) thrombocythemia: Secondary | ICD-10-CM

## 2017-10-11 DIAGNOSIS — D75839 Thrombocytosis, unspecified: Secondary | ICD-10-CM

## 2017-10-11 DIAGNOSIS — D72829 Elevated white blood cell count, unspecified: Secondary | ICD-10-CM

## 2017-10-11 DIAGNOSIS — J181 Lobar pneumonia, unspecified organism: Secondary | ICD-10-CM

## 2017-10-11 DIAGNOSIS — J189 Pneumonia, unspecified organism: Secondary | ICD-10-CM

## 2017-10-11 LAB — CBC WITH DIFFERENTIAL/PLATELET
Basophils Absolute: 0.2 10*3/uL — ABNORMAL HIGH (ref 0.0–0.1)
Basophils Relative: 1.6 % (ref 0.0–3.0)
Eosinophils Absolute: 0.3 10*3/uL (ref 0.0–0.7)
Eosinophils Relative: 2.1 % (ref 0.0–5.0)
HCT: 38.8 % (ref 36.0–46.0)
Hemoglobin: 12 g/dL (ref 12.0–15.0)
Lymphocytes Relative: 23.1 % (ref 12.0–46.0)
Lymphs Abs: 3.2 10*3/uL (ref 0.7–4.0)
MCHC: 30.9 g/dL (ref 30.0–36.0)
MCV: 77.1 fl — ABNORMAL LOW (ref 78.0–100.0)
Monocytes Absolute: 0.7 10*3/uL (ref 0.1–1.0)
Monocytes Relative: 4.7 % (ref 3.0–12.0)
Neutro Abs: 9.6 10*3/uL — ABNORMAL HIGH (ref 1.4–7.7)
Neutrophils Relative %: 68.5 % (ref 43.0–77.0)
Platelets: 718 10*3/uL — ABNORMAL HIGH (ref 150.0–400.0)
RBC: 5.03 Mil/uL (ref 3.87–5.11)
RDW: 15.8 % — ABNORMAL HIGH (ref 11.5–15.5)
WBC: 14 10*3/uL — ABNORMAL HIGH (ref 4.0–10.5)

## 2017-10-11 LAB — BASIC METABOLIC PANEL
BUN: 20 mg/dL (ref 6–23)
CO2: 26 mEq/L (ref 19–32)
Calcium: 9.7 mg/dL (ref 8.4–10.5)
Chloride: 99 mEq/L (ref 96–112)
Creatinine, Ser: 1.38 mg/dL — ABNORMAL HIGH (ref 0.40–1.20)
GFR: 48.11 mL/min — ABNORMAL LOW (ref >60.00)
Glucose, Bld: 107 mg/dL — ABNORMAL HIGH (ref 70–99)
Potassium: 4.3 mEq/L (ref 3.5–5.1)
Sodium: 138 mEq/L (ref 135–145)

## 2017-10-11 NOTE — Telephone Encounter (Signed)
Called pt but no VM. Phone just rings.

## 2017-10-11 NOTE — Telephone Encounter (Signed)
-----   Message from Biagio Borg, MD sent at 10/11/2017 12:23 PM EDT ----- Left message on MyChart, pt to cont same tx except  The test results show that your current treatment is OK, except the White blood cells are still elevated, as well as the Platelets.  This is not dangerous at the moment, but since this was confirmed from the previous, and there is no obvious cause, we should refer you Hematology (the blood doctors) to consider why this might be, and any treatment if needed.  You should also hear from the office about this.    Shirron to please inform pt, I will do referral

## 2017-10-12 ENCOUNTER — Encounter: Payer: Self-pay | Admitting: Internal Medicine

## 2017-10-12 ENCOUNTER — Ambulatory Visit (INDEPENDENT_AMBULATORY_CARE_PROVIDER_SITE_OTHER)
Admission: RE | Admit: 2017-10-12 | Discharge: 2017-10-12 | Disposition: A | Discharge status: Home / Self Care | Payer: Medicare Other | Source: Ambulatory Visit | Attending: Internal Medicine | Admitting: Internal Medicine

## 2017-10-12 ENCOUNTER — Inpatient Hospital Stay: Admission: RE | Admit: 2017-10-12 | Payer: Medicare Other | Source: Ambulatory Visit

## 2017-10-12 DIAGNOSIS — R911 Solitary pulmonary nodule: Secondary | ICD-10-CM | POA: Diagnosis not present

## 2017-10-12 IMAGING — CT CT CHEST W/O CM
2 of 3 series · 15 of 36 positions shown, 18 images · non-contrast
Comparison: [DATE] chest radiograph.   [DATE] chest CT.

CLINICAL DATA: Follow-up pulmonary nodule.  Current smoker.

EXAM:
CT CHEST WITHOUT CONTRAST
TECHNIQUE: Multidetector CT imaging of the chest was performed following the
standard protocol without IV contrast.

[Series 2: thorax · axial · 0.70mm/px · z∈[-252,-8]mm · 12 of 144 slices shown, 15 images]
[im 11/144  mediastinal]
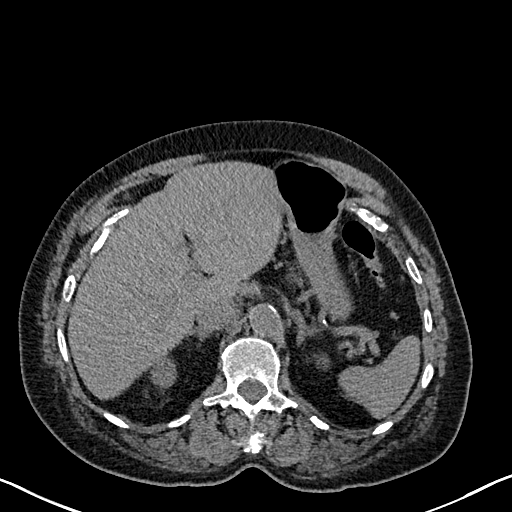
[im 11/144  lung]
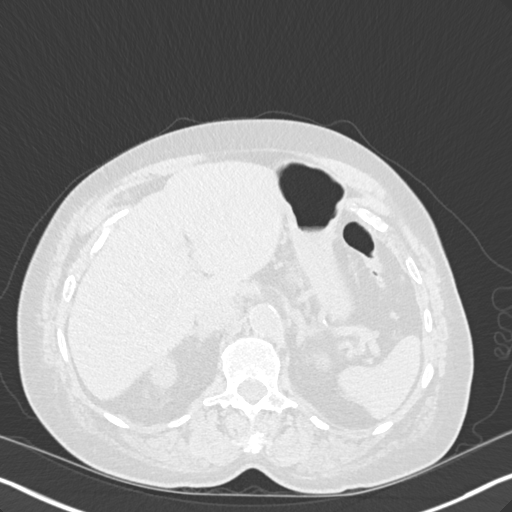
[im 22/144  lung]
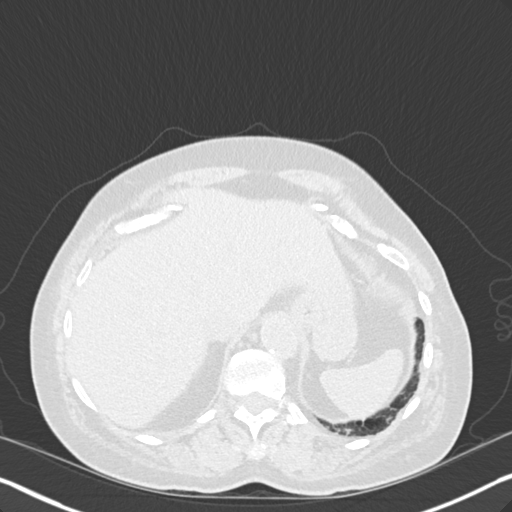
[im 32/144  lung]
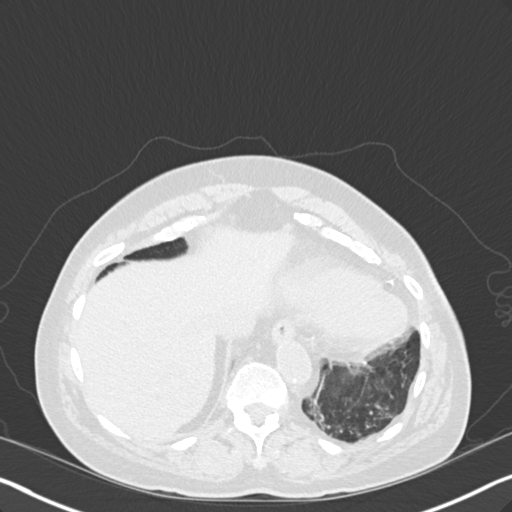
[im 43/144  lung]
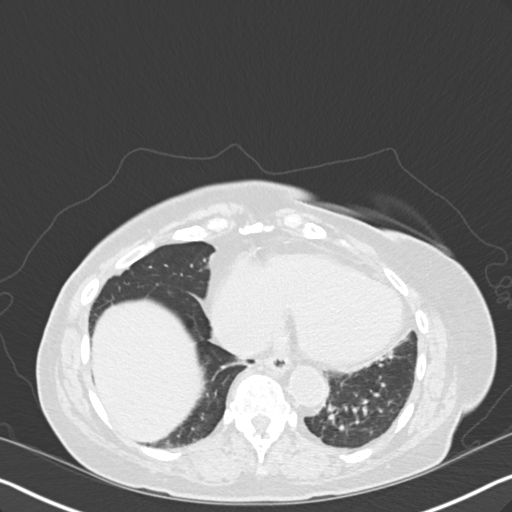
[im 53/144  mediastinal]
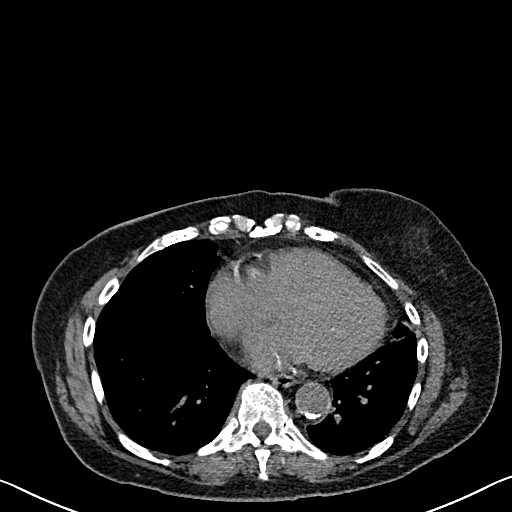
[im 53/144  lung]
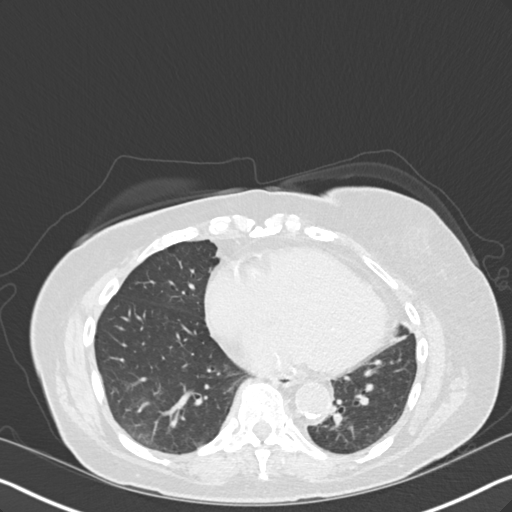
[im 64/144  lung]
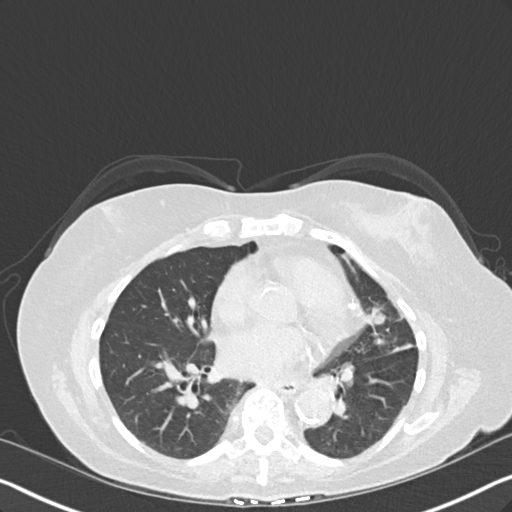
[im 80/144  lung]
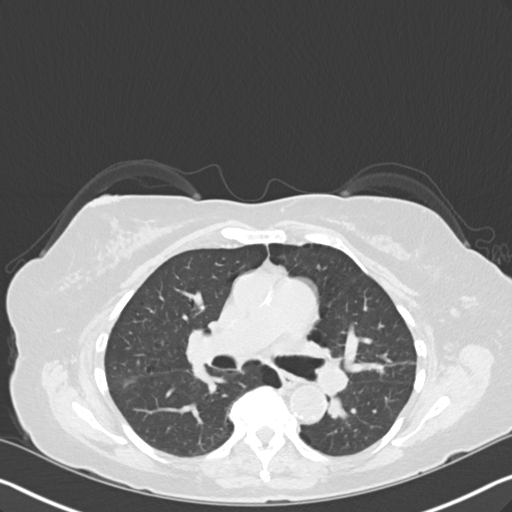
[im 91/144  lung]
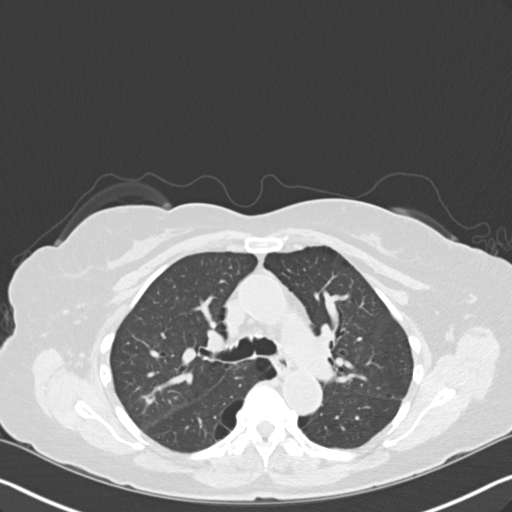
[im 101/144  mediastinal]
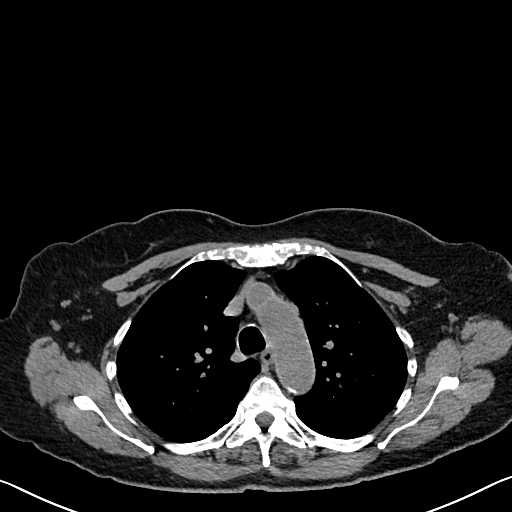
[im 101/144  lung]
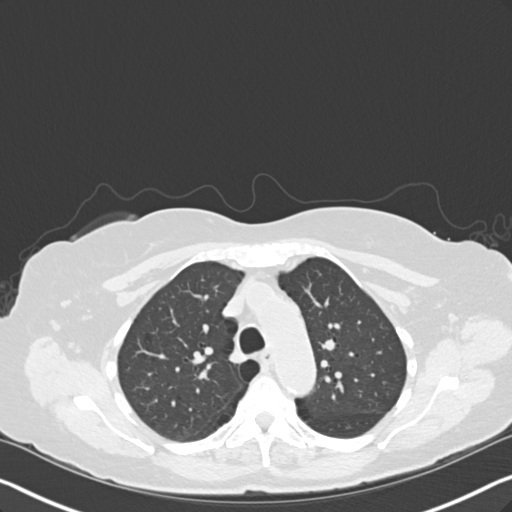
[im 112/144  lung]
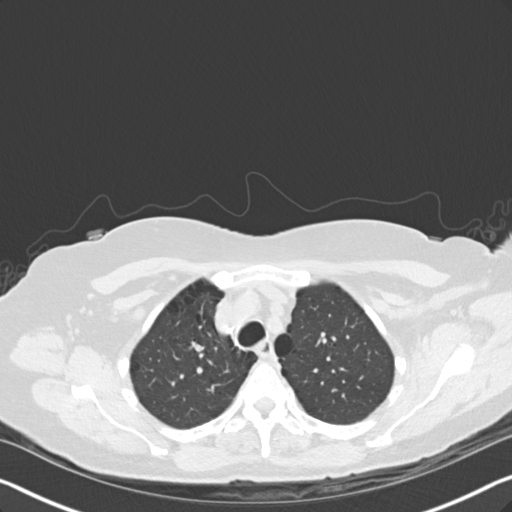
[im 122/144  lung]
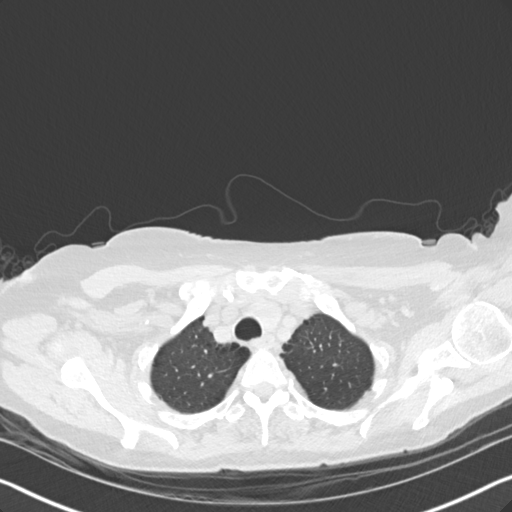
[im 133/144  lung]
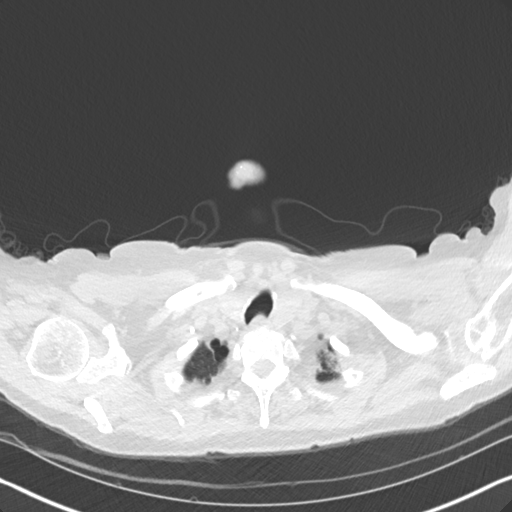

[Series 5: coronal · coronal · 0.59mm/px · 3 of 100 slices shown]
[im 20/100  lung]
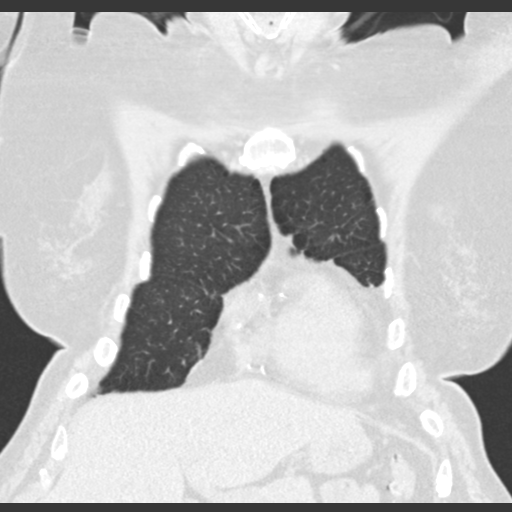
[im 40/100  lung]
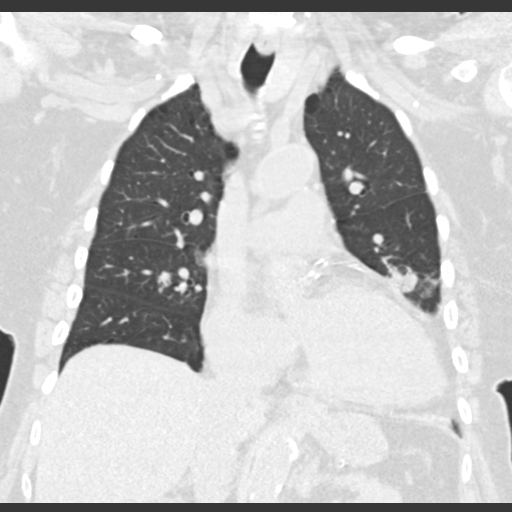
[im 60/100  lung]
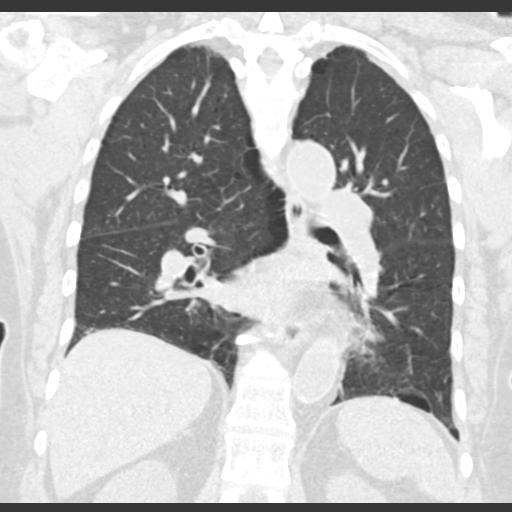

[15 of 36 positions shown; findings below may reference images not displayed]

FINDINGS: Cardiovascular: Stable mild cardiomegaly. No significant pericardial
fluid/thickening. Left main, left anterior descending, left
circumflex and right coronary atherosclerosis. Atherosclerotic
nonaneurysmal thoracic aorta. Normal caliber pulmonary arteries.

Mediastinum/Nodes: Stable mildly enlarged heterogeneous thyroid
gland without discrete thyroid nodules. Unremarkable esophagus. No
pathologically enlarged axillary, mediastinal or gross hilar lymph
nodes, noting limited sensitivity for the detection of hilar
adenopathy on this noncontrast study.

Lungs/Pleura: No pneumothorax. No pleural effusion. Mild
centrilobular and paraseptal emphysema. New mild patchy tree-in-bud
opacities in the posterior right upper lobe and dependent basilar
left lower lobe. New irregular thick bandlike opacities in the
lingula, which partially obscure the 10 mm solid lingular pulmonary
nodule (series 3/image 80), which appears stable from [DATE]
using similar measurement technique. No new significant pulmonary
nodules.

Upper abdomen: Colonic diverticulosis.

Musculoskeletal: No aggressive appearing focal osseous lesions.
Moderate thoracic spondylosis.
IMPRESSION: 1. New irregular thick bandlike opacities in the lingula. New mild
patchy tree-in-bud opacities in the right upper and left lower
lobes. Given the apparent lingular pneumonia on recent [DATE]
chest radiographs, these findings are favored to represent resolving
multilobar infection.
2. Solid 10 mm lingular pulmonary nodule, partially obscured by the
resolving lingular infection, not appreciably changed since
[DATE] chest CT using similar measurement technique, most
compatible with a benign nodule.
3. Stable mild cardiomegaly. Left main and 3 vessel coronary
atherosclerosis.

Aortic Atherosclerosis ([C0]-[C0]) and Emphysema ([C0]-[C0]).

## 2018-01-06 ENCOUNTER — Encounter: Payer: Self-pay | Admitting: Internal Medicine

## 2018-01-06 ENCOUNTER — Other Ambulatory Visit (INDEPENDENT_AMBULATORY_CARE_PROVIDER_SITE_OTHER): Payer: Medicare Other

## 2018-01-06 ENCOUNTER — Ambulatory Visit (INDEPENDENT_AMBULATORY_CARE_PROVIDER_SITE_OTHER): Payer: Medicare Other | Admitting: Internal Medicine

## 2018-01-06 ENCOUNTER — Telehealth: Payer: Self-pay

## 2018-01-06 VITALS — BP 132/74 | HR 71 | Temp 97.7°F | Ht 62.0 in | Wt 149.0 lb

## 2018-01-06 DIAGNOSIS — Z0001 Encounter for general adult medical examination with abnormal findings: Secondary | ICD-10-CM | POA: Diagnosis not present

## 2018-01-06 DIAGNOSIS — E559 Vitamin D deficiency, unspecified: Secondary | ICD-10-CM | POA: Diagnosis not present

## 2018-01-06 DIAGNOSIS — Z23 Encounter for immunization: Secondary | ICD-10-CM | POA: Diagnosis not present

## 2018-01-06 DIAGNOSIS — J449 Chronic obstructive pulmonary disease, unspecified: Secondary | ICD-10-CM | POA: Diagnosis not present

## 2018-01-06 DIAGNOSIS — J019 Acute sinusitis, unspecified: Secondary | ICD-10-CM | POA: Diagnosis not present

## 2018-01-06 DIAGNOSIS — R739 Hyperglycemia, unspecified: Secondary | ICD-10-CM | POA: Diagnosis not present

## 2018-01-06 DIAGNOSIS — E785 Hyperlipidemia, unspecified: Secondary | ICD-10-CM

## 2018-01-06 DIAGNOSIS — I10 Essential (primary) hypertension: Secondary | ICD-10-CM

## 2018-01-06 LAB — CBC WITH DIFFERENTIAL/PLATELET
Basophils Absolute: 0.1 10*3/uL (ref 0.0–0.1)
Basophils Relative: 1.3 % (ref 0.0–3.0)
Eosinophils Absolute: 0.3 10*3/uL (ref 0.0–0.7)
Eosinophils Relative: 2.9 % (ref 0.0–5.0)
HCT: 45.6 % (ref 36.0–46.0)
Hemoglobin: 14.2 g/dL (ref 12.0–15.0)
Lymphocytes Relative: 24.6 % (ref 12.0–46.0)
Lymphs Abs: 2.4 10*3/uL (ref 0.7–4.0)
MCHC: 31.1 g/dL (ref 30.0–36.0)
MCV: 77.9 fl — ABNORMAL LOW (ref 78.0–100.0)
Monocytes Absolute: 0.6 10*3/uL (ref 0.1–1.0)
Monocytes Relative: 6.7 % (ref 3.0–12.0)
Neutro Abs: 6.2 10*3/uL (ref 1.4–7.7)
Neutrophils Relative %: 64.5 % (ref 43.0–77.0)
Platelets: 442 10*3/uL — ABNORMAL HIGH (ref 150.0–400.0)
RBC: 5.86 Mil/uL — ABNORMAL HIGH (ref 3.87–5.11)
RDW: 18.3 % — ABNORMAL HIGH (ref 11.5–15.5)
WBC: 9.6 10*3/uL (ref 4.0–10.5)

## 2018-01-06 LAB — URINALYSIS, ROUTINE W REFLEX MICROSCOPIC
Bilirubin Urine: NEGATIVE
Hgb urine dipstick: NEGATIVE
Ketones, ur: NEGATIVE
Leukocytes, UA: NEGATIVE
Nitrite: NEGATIVE
Specific Gravity, Urine: 1.01 (ref 1.000–1.030)
Total Protein, Urine: 30 — AB
Urine Glucose: NEGATIVE
Urobilinogen, UA: 0.2 (ref 0.0–1.0)
pH: 6 (ref 5.0–8.0)

## 2018-01-06 LAB — BASIC METABOLIC PANEL
BUN: 19 mg/dL (ref 6–23)
CO2: 29 mEq/L (ref 19–32)
Calcium: 9.4 mg/dL (ref 8.4–10.5)
Chloride: 97 mEq/L (ref 96–112)
Creatinine, Ser: 1.5 mg/dL — ABNORMAL HIGH (ref 0.40–1.20)
GFR: 43.67 mL/min — ABNORMAL LOW (ref >60.00)
Glucose, Bld: 101 mg/dL — ABNORMAL HIGH (ref 70–99)
Potassium: 3.7 mEq/L (ref 3.5–5.1)
Sodium: 137 mEq/L (ref 135–145)

## 2018-01-06 LAB — HEMOGLOBIN A1C: Hgb A1c MFr Bld: 5.7 % (ref 4.6–6.5)

## 2018-01-06 LAB — LIPID PANEL
Cholesterol: 175 mg/dL (ref 0–200)
HDL: 64.3 mg/dL (ref >39.00)
LDL Cholesterol: 91 mg/dL (ref 0–99)
NonHDL: 110.21
Total CHOL/HDL Ratio: 3
Triglycerides: 97 mg/dL (ref 0.0–149.0)
VLDL: 19.4 mg/dL (ref 0.0–40.0)

## 2018-01-06 LAB — HEPATIC FUNCTION PANEL
ALT: 10 U/L (ref 0–35)
AST: 14 U/L (ref 0–37)
Albumin: 4.5 g/dL (ref 3.5–5.2)
Alkaline Phosphatase: 78 U/L (ref 39–117)
Bilirubin, Direct: 0.1 mg/dL (ref 0.0–0.3)
Total Bilirubin: 0.5 mg/dL (ref 0.2–1.2)
Total Protein: 8 g/dL (ref 6.0–8.3)

## 2018-01-06 LAB — TSH: TSH: 0.64 u[IU]/mL (ref 0.35–4.50)

## 2018-01-06 LAB — VITAMIN D 25 HYDROXY (VIT D DEFICIENCY, FRACTURES): VITD: 21.44 ng/mL — ABNORMAL LOW (ref 30.00–100.00)

## 2018-01-06 MED ORDER — ZOSTER VAC RECOMB ADJUVANTED 50 MCG/0.5ML IM SUSR: 0.5000 mL | Freq: Once | INTRAMUSCULAR | 1 refills | Status: AC

## 2018-01-06 MED ORDER — AZITHROMYCIN 250 MG PO TABS: ORAL_TABLET | ORAL | 1 refills | Status: DC

## 2018-01-06 NOTE — Telephone Encounter (Signed)
Pt has been informed and expressed understanding.  

## 2018-01-06 NOTE — Progress Notes (Signed)
Subjective:    Patient ID: Elaine Turner, female    DOB: [birth year], 72 y.o.   MRN: [id number]  HPI  Here for wellness and f/u;  Overall doing ok;   Pt denies neurological change such as new headache, facial or extremity weakness.  Pt denies polydipsia, polyuria, or low sugar symptoms. Pt states overall good compliance with treatment and medications, good tolerability, and has been trying to follow appropriate diet.  Pt denies worsening depressive symptoms, suicidal ideation or panic. No fever, night sweats, wt loss, loss of appetite, or other constitutional symptoms.  Pt states good ability with ADL's, has low fall risk, home safety reviewed and adequate, no other significant changes in hearing or vision, and only occasionally active with exercise.  No other new complaints or interval hx except:  Here with 2-3 days acute onset fever, facial pain, pressure, headache, general weakness and malaise, and greenish d/c, with mild ST and cough, but pt denies chest pain, wheezing, increased sob or doe, orthopnea, PND, increased LE swelling, palpitations, dizziness or syncope. Past Medical History:  Diagnosis Date  . Arthritis   . Chronic diarrhea   . COPD  GOLD 0 / active smoker  10/24/2015  . Heart murmur   . Hemorrhoids   . Hyperglycemia 05/01/2013  . Hypertension   . Pneumonia 05/01/2013  . PVD (peripheral vascular disease) ([place])   . Urine incontinence    Past Surgical History:  Procedure Laterality Date  . ABDOMINAL HYSTERECTOMY    . HEMORRHOID SURGERY N/A 06/26/2015   Procedure: INTERNAL AND EXTERNAL HEMORRHOIDECTOMY;  Surgeon: Georganna Skeans, MD;  Location: [facility];  Service: General;  Laterality: N/A;    reports that she has been smoking cigarettes.  She has a 87.00 pack-year smoking history. she has never used smokeless tobacco. She reports that she drinks about 0.6 oz of alcohol per week. She reports that she does not use drugs. family history includes Hypertension in her  mother; Lung cancer in her father. Allergies  Allergen Reactions  . Ace Inhibitors Cough  . Levaquin [Levofloxacin] Other (See Comments)    GI upset   Current Outpatient Medications on File Prior to Visit  Medication Sig Dispense Refill  . albuterol (PROVENTIL HFA;VENTOLIN HFA) 108 (90 Base) MCG/ACT inhaler Inhale 2 puffs into the lungs every 6 (six) hours as needed for wheezing or shortness of breath. 1 Inhaler 2  . aspirin 81 MG EC tablet Take 1 tablet (81 mg total) by mouth daily. Swallow whole. 30 tablet 12  . benzonatate (TESSALON) 100 MG capsule Take 1 capsule (100 mg total) by mouth 2 (two) times daily as needed for cough. 60 capsule 0  . hydrochlorothiazide (HYDRODIURIL) 25 MG tablet Take 1 tablet (25 mg total) by mouth daily. 90 tablet 3  . losartan (COZAAR) 50 MG tablet Take 1 tablet (50 mg total) by mouth daily. 90 tablet 3  . NIFEdipine (PROCARDIA XL/ADALAT-CC) 90 MG 24 hr tablet TAKE 1 TABLET (90 MG TOTAL) BY MOUTH DAILY. 90 tablet 3   No current facility-administered medications on file prior to visit.    Review of Systems Constitutional: Negative for other unusual diaphoresis, sweats, appetite or weight changes HENT: Negative for other worsening hearing loss, ear pain, facial swelling, mouth sores or neck stiffness.   Eyes: Negative for other worsening pain, redness or other visual disturbance.  Respiratory: Negative for other stridor or swelling Cardiovascular: Negative for other palpitations or other chest pain  Gastrointestinal: Negative for worsening diarrhea or loose  stools, blood in stool, distention or other pain Genitourinary: Negative for hematuria, flank pain or other change in urine volume.  Musculoskeletal: Negative for myalgias or other joint swelling.  Skin: Negative for other color change, or other wound or worsening drainage.  Neurological: Negative for other syncope or numbness. Hematological: Negative for other adenopathy or  swelling Psychiatric/Behavioral: Negative for hallucinations, other worsening agitation, SI, self-injury, or new decreased concentration All other system neg per pt    Objective:   Physical Exam BP 132/74   Pulse 71   Temp 97.7 F (36.5 C) (Oral)   Ht 5\' 2"  (1.575 m)   Wt 149 lb (67.6 kg)   SpO2 98%   BMI 27.25 kg/m  VS noted, mild ill  Constitutional: Pt is oriented to person, place, and time. Appears well-developed and well-nourished, in no significant distress and comfortable Head: Normocephalic and atraumatic  Eyes: Conjunctivae and EOM are normal. Pupils are equal, round, and reactive to light Right Ear: External ear normal without discharge Left Ear: External ear normal without discharge Nose: Nose without discharge or deformity Bilat tm's with mild erythema.  Max sinus areas mild tender.  Pharynx with mild erythema, no exudate Mouth/Throat: Oropharynx is without other ulcerations and moist  Neck: Normal range of motion. Neck supple. No JVD present. No tracheal deviation present or significant neck LA or mass Cardiovascular: Normal rate, regular rhythm, normal heart sounds and intact distal pulses.   Pulmonary/Chest: WOB normal and breath sounds without rales or wheezing  Abdominal: Soft. Bowel sounds are normal. NT. No HSM  Musculoskeletal: Normal range of motion. Exhibits no edema Lymphadenopathy: Has no other cervical adenopathy.  Neurological: Pt is alert and oriented to person, place, and time. Pt has normal reflexes. No cranial nerve deficit. Motor grossly intact, Gait intact Skin: Skin is warm and dry. No rash noted or new ulcerations Psychiatric:  Has normal mood and affect. Behavior is normal without agitation No other exam findings Lab Results  Component Value Date   WBC 9.6 01/06/2018   HGB 14.2 01/06/2018   HCT 45.6 01/06/2018   PLT 442.0 (H) 01/06/2018   GLUCOSE 101 (H) 01/06/2018   CHOL 175 01/06/2018   TRIG 97.0 01/06/2018   HDL 64.30 01/06/2018    LDLCALC 91 01/06/2018   ALT 10 01/06/2018   AST 14 01/06/2018   NA 137 01/06/2018   K 3.7 01/06/2018   CL 97 01/06/2018   CREATININE 1.50 (H) 01/06/2018   BUN 19 01/06/2018   CO2 29 01/06/2018   TSH 0.64 01/06/2018   HGBA1C 5.7 01/06/2018      Assessment & Plan:

## 2018-01-06 NOTE — Patient Instructions (Addendum)
You had the flu shot today  Your Shingles shot was sent to the pharmacy  Please take all new medication as prescribed - the antibiotic  You can also take Delsym OTC for cough, and/or Mucinex (or it's generic off brand) for congestion, and tylenol as needed for pain.  Please continue all other medications as before, and refills have been done if requested.  Please have the pharmacy call with any other refills you may need.  Please continue your efforts at being more active, low cholesterol diet, and weight control.  You are otherwise up to date with prevention measures today.  Please keep your appointments with your specialists as you may have planned  Please go to the LAB in the Basement (turn left off the elevator) for the tests to be done today  You will be contacted by phone if any changes need to be made immediately.  Otherwise, you will receive a letter about your results with an explanation, but please check with MyChart first.  Please remember to sign up for MyChart if you have not done so, as this will be important to you in the future with finding out test results, communicating by private email, and scheduling acute appointments online when needed.  Please return in 6 months, or sooner if needed

## 2018-01-06 NOTE — Telephone Encounter (Signed)
-----   Message from Biagio Borg, MD sent at 01/06/2018  1:06 PM EST ----- Left message on MyChart, pt to cont same tx except   The test results show that your current treatment is OK, except the Vitamin D level is mildly low.  Please start OTC Vitamin D at 2000 units per day indefinitely.Redmond Baseman to please inform pt

## 2018-01-08 DIAGNOSIS — J019 Acute sinusitis, unspecified: Secondary | ICD-10-CM | POA: Insufficient documentation

## 2018-01-08 NOTE — Assessment & Plan Note (Signed)
stable overall by history and exam, recent data reviewed with pt, and pt to continue medical treatment as before,  to f/u any worsening symptoms or concerns BP Readings from Last 3 Encounters:  01/06/18 132/74  10/06/17 (!) 160/80  10/01/17 (!) 134/58

## 2018-01-08 NOTE — Assessment & Plan Note (Addendum)
Mild to mod, for antibx course,  to f/u any worsening symptoms or concerns  In addition to the time spent performing CPE, I spent an additional 25 minutes face to face,in which greater than 50% of this time was spent in counseling and coordination of care for patient's acute illness as documented, including the differential dx, treatment, further evaluation and other management of acute sinus infection, HTN, HLD, hyperglycemia and COPD

## 2018-01-08 NOTE — Assessment & Plan Note (Signed)
Lab Results  Component Value Date   LDLCALC 91 01/06/2018  stable overall by history and exam, recent data reviewed with pt, and pt to continue medical treatment as before,  to f/u any worsening symptoms or concerns

## 2018-01-08 NOTE — Assessment & Plan Note (Signed)

## 2018-01-08 NOTE — Assessment & Plan Note (Signed)
stable overall by history and exam, and pt to continue medical treatment as before,  to f/u any worsening symptoms or concerns 

## 2018-01-08 NOTE — Assessment & Plan Note (Signed)
Lab Results  Component Value Date   HGBA1C 5.7 01/06/2018  stable overall by history and exam, recent data reviewed with pt, and pt to continue medical treatment as before,  to f/u any worsening symptoms or concerns

## 2018-01-13 ENCOUNTER — Telehealth: Payer: Self-pay | Admitting: Internal Medicine

## 2018-01-13 MED ORDER — DOXYCYCLINE HYCLATE 100 MG PO TABS: 100.0000 mg | ORAL_TABLET | Freq: Two times a day (BID) | ORAL | 0 refills | Status: DC

## 2018-01-13 NOTE — Telephone Encounter (Addendum)
Called pt, phone only rings, no VM service.

## 2018-01-13 NOTE — Telephone Encounter (Signed)
Pt has been informed and expressed understanding.  

## 2018-01-13 NOTE — Telephone Encounter (Signed)
Copied from [place] [id number]. Topic: Quick Communication - See Telephone Encounter >> Jan 13, 2018  8:19 AM Oneta Rack wrote: CRM for notification. See Telephone encounter for:   01/13/18.   Relation to pt: self  Call back number: [phone] Pharmacy: [id number] - [place], [place] [phone] (Phone) [phone] (Fax)  Reason for call:  Patient last seen 01/06/18 by Dr. Jenny Reichmann and patient states azithromycin (ZITHROMAX Z-PAK) 250 MG tablet  is not working patient experiencing cough, sore throat requesting alternate Rx, please advise

## 2018-01-13 NOTE — Telephone Encounter (Signed)
Patient returned call.  Please call [phone]

## 2018-01-13 NOTE — Telephone Encounter (Signed)
Ok to change to doxy course - done erx

## 2018-01-18 ENCOUNTER — Ambulatory Visit: Payer: Medicare Other | Admitting: Internal Medicine

## 2018-01-18 ENCOUNTER — Ambulatory Visit (INDEPENDENT_AMBULATORY_CARE_PROVIDER_SITE_OTHER): Payer: Medicare Other | Admitting: Family

## 2018-01-18 ENCOUNTER — Encounter: Payer: Self-pay | Admitting: Family

## 2018-01-18 VITALS — BP 130/68 | HR 86 | Temp 98.0°F | Wt 147.0 lb

## 2018-01-18 DIAGNOSIS — J019 Acute sinusitis, unspecified: Secondary | ICD-10-CM | POA: Diagnosis not present

## 2018-01-18 MED ORDER — FLUTICASONE PROPIONATE 50 MCG/ACT NA SUSP: 2.0000 | Freq: Every day | NASAL | 6 refills | Status: DC

## 2018-01-18 MED ORDER — BENZONATATE 100 MG PO CAPS: 100.0000 mg | ORAL_CAPSULE | Freq: Two times a day (BID) | ORAL | 0 refills | Status: DC | PRN

## 2018-01-18 NOTE — Progress Notes (Signed)
Elaine Turner is a 72 y.o. female with the following history as recorded in Epic[employer]:  Patient Active Problem List   Diagnosis Date Noted  . Acute sinus infection 01/08/2018  . Acute respiratory distress 09/30/2017  . AKI (acute kidney injury) ([place])   . COPD with acute exacerbation ([place])   . Cough 08/09/2017  . Weight loss 06/17/2017  . Constipation 06/17/2017  . Skin tag 03/19/2017  . Abnormal breath sounds 03/25/2016  . COPD  GOLD 0 / active smoker  10/24/2015  . Solitary pulmonary nodule 05/31/2015  . Left knee pain 02/09/2014  . Primary localized osteoarthrosis, lower leg 02/09/2014  . Anemia, unspecified 05/15/2013  . Hypokalemia 05/08/2013  . Hemorrhoids 05/02/2013  . CAP (community acquired pneumonia) 05/01/2013  . Hyponatremia 05/01/2013  . CKD (chronic kidney disease), stage II 05/01/2013  . Hyperglycemia 05/01/2013  . Atherosclerosis of native arteries of the extremities with intermittent claudication 10/14/2012  . Osteoarthritis 09/08/2012  . Cigarette smoker 09/08/2012  . Insomnia 09/08/2012  . Hyperlipidemia 09/08/2012  . Heart murmur, aortic 09/08/2012  . Encounter for well adult exam with abnormal findings 09/02/2012  . Hypertension   . PVD (peripheral vascular disease) ([place])     Current Outpatient Medications  Medication Sig Dispense Refill  . albuterol (PROVENTIL HFA;VENTOLIN HFA) 108 (90 Base) MCG/ACT inhaler Inhale 2 puffs into the lungs every 6 (six) hours as needed for wheezing or shortness of breath. 1 Inhaler 2  . aspirin 81 MG EC tablet Take 1 tablet (81 mg total) by mouth daily. Swallow whole. 30 tablet 12  . benzonatate (TESSALON) 100 MG capsule Take 1 capsule (100 mg total) by mouth 2 (two) times daily as needed for cough. 60 capsule 0  . doxycycline (VIBRA-TABS) 100 MG tablet Take 1 tablet (100 mg total) by mouth 2 (two) times daily. 20 tablet 0  . hydrochlorothiazide (HYDRODIURIL) 25 MG tablet Take 1 tablet (25 mg total) by mouth daily. 90  tablet 3  . losartan (COZAAR) 50 MG tablet Take 1 tablet (50 mg total) by mouth daily. 90 tablet 3  . NIFEdipine (PROCARDIA XL/ADALAT-CC) 90 MG 24 hr tablet TAKE 1 TABLET (90 MG TOTAL) BY MOUTH DAILY. 90 tablet 3  . fluticasone (FLONASE) 50 MCG/ACT nasal spray Place 2 sprays into both nostrils daily. 16 g 6   No current facility-administered medications for this visit.     Allergies: Ace inhibitors and Levaquin [levofloxacin]  Past Medical History:  Diagnosis Date  . Arthritis   . Chronic diarrhea   . COPD  GOLD 0 / active smoker  10/24/2015  . Heart murmur   . Hemorrhoids   . Hyperglycemia 05/01/2013  . Hypertension   . Pneumonia 05/01/2013  . PVD (peripheral vascular disease) ([place])   . Urine incontinence     Past Surgical History:  Procedure Laterality Date  . ABDOMINAL HYSTERECTOMY    . HEMORRHOID SURGERY N/A 06/26/2015   Procedure: INTERNAL AND EXTERNAL HEMORRHOIDECTOMY;  Surgeon: Georganna Skeans, MD;  Location: [facility];  Service: General;  Laterality: N/A;    Family History  Problem Relation Age of Onset  . Hypertension Mother   . Lung cancer Father     Social History   Tobacco Use  . Smoking status: Current Every Day Smoker    Packs/day: 1.50    Years: 58.00    Pack years: 87.00    Types: Cigarettes  . Smokeless tobacco: Never Used  Substance Use Topics  . Alcohol use: Yes  Alcohol/week: 0.6 oz    Types: 1 Glasses of wine per week    Comment: occassional    Subjective:  Was originally treated for possible sinus infection/ bronchitis on 1/10 with Z-pak; symptoms persisted and was changed to Doxycycline last week; has now been on Doxycycline x 5 days and concerned about persisting runny nose; is having some diarrhea since starting the antibiotics but notes that symptoms are "manageable." Denies any chest pain or shortness of breath; denies any wheezing, fever; has used Tylenol cold with no benefit; would also like refill on Tessalon Perles  today; Had chest CT in 09/2017;    Objective:  Vitals:   01/18/18 1421  BP: 130/68  Pulse: 86  Temp: 98 F (36.7 C)  SpO2: 96%  Weight: 147 lb (66.7 kg)    General: Well developed, well nourished, in no acute distress  Skin : Warm and dry.  Head: Normocephalic and atraumatic  Eyes: Sclera and conjunctiva clear; pupils round and reactive to light; extraocular movements intact  Ears: External normal; canals clear; tympanic membranes normal  Oropharynx: Pink, supple. No suspicious lesions  Neck: Supple without thyromegaly, adenopathy  Lungs: Respirations unlabored; clear to auscultation bilaterally without wheeze, rales, rhonchi  CVS exam: normal rate and regular rhythm.  Neurologic: Alert and oriented; speech intact; face symmetrical; moves all extremities well; CNII-XII intact without focal deficit  Assessment:  1. Acute sinusitis, recurrence not specified, unspecified location     Plan:  Continue Doxycycline- reassurance regarding mild GI side effects with this medication; patient is comfortable staying on antibiotic at this time; refill given on Tessalon Perles; trial of Flonase and Claritin; increase fluids, rest and follow-up worse, no better. May need to consider CXR and/ or oral steroids;   No Follow-up on file.  No orders of the defined types were placed in this encounter.   Requested Prescriptions   Signed Prescriptions Disp Refills  . benzonatate (TESSALON) 100 MG capsule 60 capsule 0    Sig: Take 1 capsule (100 mg total) by mouth 2 (two) times daily as needed for cough.  . fluticasone (FLONASE) 50 MCG/ACT nasal spray 16 g 6    Sig: Place 2 sprays into both nostrils daily.

## 2018-01-18 NOTE — Patient Instructions (Signed)
Please add Claritin 10 mg ( Loratidine) once a day in the mornings until your symptoms are better; take Flonase at night;

## 2018-01-24 ENCOUNTER — Telehealth: Payer: Self-pay | Admitting: Internal Medicine

## 2018-01-24 MED ORDER — CEPHALEXIN 500 MG PO CAPS: 500.0000 mg | ORAL_CAPSULE | Freq: Three times a day (TID) | ORAL | 0 refills | Status: AC

## 2018-01-24 NOTE — Telephone Encounter (Signed)
Copied from [place] [id number]. Topic: Quick Communication - See Telephone Encounter >> Jan 24, 2018  8:55 AM Ether Griffins B wrote: CRM for notification. See Telephone encounter for:  Pt calling in stating the doxycycline is not helping her cold and she would like something else called in.  01/24/18.

## 2018-01-24 NOTE — Telephone Encounter (Signed)
Ok to change the doxy to cephalexin - done erx

## 2018-01-24 NOTE — Telephone Encounter (Signed)
Pt has been informed and expressed understanding.  

## 2018-02-02 DIAGNOSIS — H10413 Chronic giant papillary conjunctivitis, bilateral: Secondary | ICD-10-CM | POA: Diagnosis not present

## 2018-02-02 DIAGNOSIS — H35372 Puckering of macula, left eye: Secondary | ICD-10-CM | POA: Diagnosis not present

## 2018-02-02 DIAGNOSIS — H35371 Puckering of macula, right eye: Secondary | ICD-10-CM | POA: Diagnosis not present

## 2018-02-02 DIAGNOSIS — Z961 Presence of intraocular lens: Secondary | ICD-10-CM | POA: Diagnosis not present

## 2018-02-10 ENCOUNTER — Encounter (HOSPITAL_COMMUNITY): Payer: Self-pay

## 2018-02-10 ENCOUNTER — Emergency Department (HOSPITAL_COMMUNITY)
Admission: EM | Admit: 2018-02-10 | Discharge: 2018-02-11 | Disposition: A | Discharge status: Home / Self Care | Payer: Medicare Other | Attending: Emergency Medicine | Admitting: Emergency Medicine

## 2018-02-10 ENCOUNTER — Emergency Department (HOSPITAL_COMMUNITY): Payer: Medicare Other

## 2018-02-10 DIAGNOSIS — R1033 Periumbilical pain: Secondary | ICD-10-CM | POA: Diagnosis not present

## 2018-02-10 DIAGNOSIS — J449 Chronic obstructive pulmonary disease, unspecified: Secondary | ICD-10-CM | POA: Insufficient documentation

## 2018-02-10 DIAGNOSIS — K5732 Diverticulitis of large intestine without perforation or abscess without bleeding: Secondary | ICD-10-CM

## 2018-02-10 DIAGNOSIS — R109 Unspecified abdominal pain: Secondary | ICD-10-CM | POA: Diagnosis not present

## 2018-02-10 DIAGNOSIS — F17210 Nicotine dependence, cigarettes, uncomplicated: Secondary | ICD-10-CM | POA: Insufficient documentation

## 2018-02-10 DIAGNOSIS — N182 Chronic kidney disease, stage 2 (mild): Secondary | ICD-10-CM | POA: Insufficient documentation

## 2018-02-10 DIAGNOSIS — K5730 Diverticulosis of large intestine without perforation or abscess without bleeding: Secondary | ICD-10-CM | POA: Diagnosis not present

## 2018-02-10 DIAGNOSIS — I129 Hypertensive chronic kidney disease with stage 1 through stage 4 chronic kidney disease, or unspecified chronic kidney disease: Secondary | ICD-10-CM | POA: Diagnosis not present

## 2018-02-10 DIAGNOSIS — Z79899 Other long term (current) drug therapy: Secondary | ICD-10-CM | POA: Insufficient documentation

## 2018-02-10 DIAGNOSIS — Z7982 Long term (current) use of aspirin: Secondary | ICD-10-CM | POA: Diagnosis not present

## 2018-02-10 LAB — CBC
HCT: 42.6 % (ref 36.0–46.0)
Hemoglobin: 13.7 g/dL (ref 12.0–15.0)
MCH: 24.5 pg — ABNORMAL LOW (ref 26.0–34.0)
MCHC: 32.2 g/dL (ref 30.0–36.0)
MCV: 76.1 fL — ABNORMAL LOW (ref 78.0–100.0)
Platelets: 358 10*3/uL (ref 150–400)
RBC: 5.6 MIL/uL — ABNORMAL HIGH (ref 3.87–5.11)
RDW: 15.9 % — ABNORMAL HIGH (ref 11.5–15.5)
WBC: 18.5 10*3/uL — ABNORMAL HIGH (ref 4.0–10.5)

## 2018-02-10 LAB — URINALYSIS, ROUTINE W REFLEX MICROSCOPIC
Bacteria, UA: NONE SEEN
Bilirubin Urine: NEGATIVE
Glucose, UA: NEGATIVE mg/dL
Hgb urine dipstick: NEGATIVE
Ketones, ur: NEGATIVE mg/dL
Leukocytes, UA: NEGATIVE
Nitrite: NEGATIVE
Protein, ur: 30 mg/dL — AB
RBC / HPF: NONE SEEN RBC/hpf (ref 0–5)
Specific Gravity, Urine: 1.017 (ref 1.005–1.030)
pH: 5 (ref 5.0–8.0)

## 2018-02-10 LAB — COMPREHENSIVE METABOLIC PANEL
ALT: 13 U/L — ABNORMAL LOW (ref 14–54)
AST: 17 U/L (ref 15–41)
Albumin: 3.7 g/dL (ref 3.5–5.0)
Alkaline Phosphatase: 81 U/L (ref 38–126)
Anion gap: 13 (ref 5–15)
BUN: 20 mg/dL (ref 6–20)
CO2: 24 mmol/L (ref 22–32)
Calcium: 8.8 mg/dL — ABNORMAL LOW (ref 8.9–10.3)
Chloride: 102 mmol/L (ref 101–111)
Creatinine, Ser: 1.44 mg/dL — ABNORMAL HIGH (ref 0.44–1.00)
GFR calc Af Amer: 41 mL/min — ABNORMAL LOW (ref >60)
GFR calc non Af Amer: 35 mL/min — ABNORMAL LOW (ref >60)
Glucose, Bld: 91 mg/dL (ref 65–99)
Potassium: 3.5 mmol/L (ref 3.5–5.1)
Sodium: 139 mmol/L (ref 135–145)
Total Bilirubin: 0.7 mg/dL (ref 0.3–1.2)
Total Protein: 7.2 g/dL (ref 6.5–8.1)

## 2018-02-10 LAB — LIPASE, BLOOD: Lipase: 35 U/L (ref 11–51)

## 2018-02-10 IMAGING — CT CT ABD-PELV W/ CM
2 of 5 series · 16 of 46 positions shown, 18 images · IV contrast (APPLIED)
Comparison: None.

CLINICAL DATA: Diffuse abdominal pain.  Elevated white count.

EXAM:
CT ABDOMEN AND PELVIS WITH CONTRAST
TECHNIQUE: Multidetector CT imaging of the abdomen and pelvis was performed
using the standard protocol following bolus administration of
intravenous contrast.
CONTRAST:  100mL [IA] IOPAMIDOL ([IA]) INJECTION 61%

[Series 3: abdomen 5.0 · axial · 0.95mm/px · z∈[+709,+1099]mm · 13 of 91 slices shown, 15 images]
[im 7/91  soft-tissue]
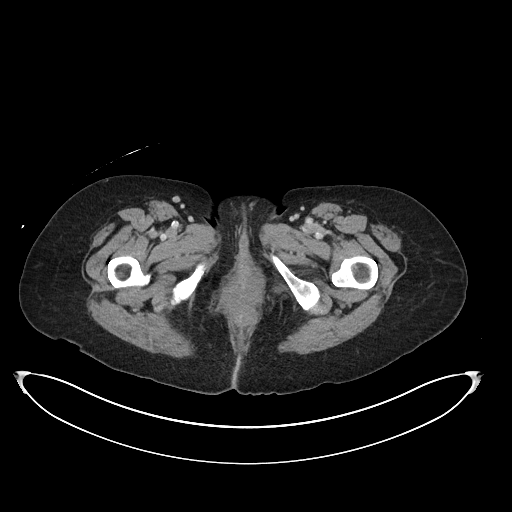
[im 7/91  bone]
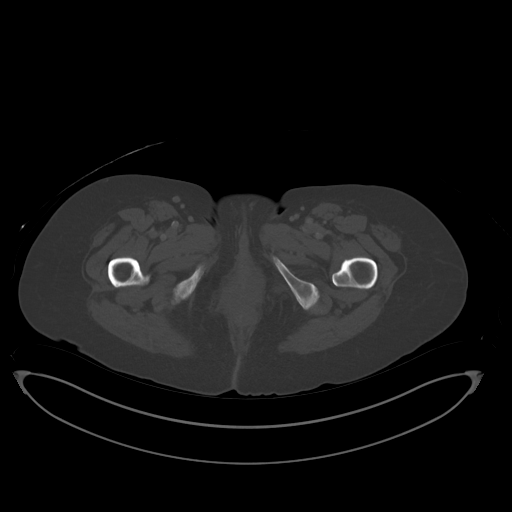
[im 13/91  soft-tissue]
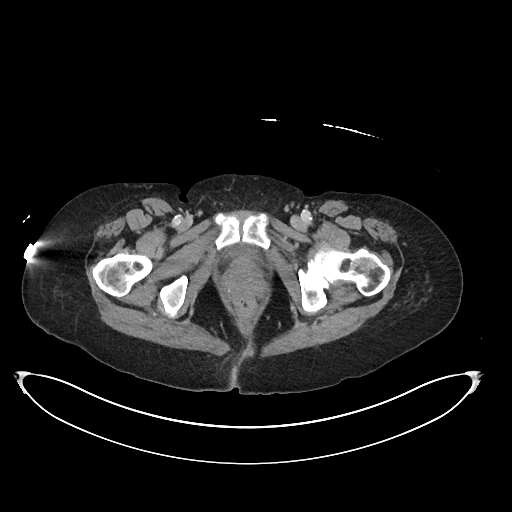
[im 19/91  soft-tissue]
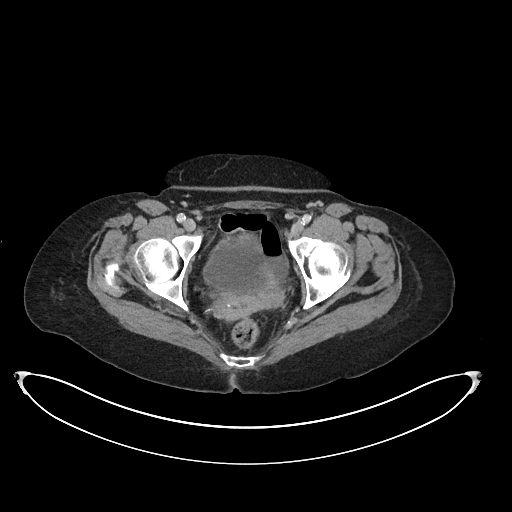
[im 25/91  soft-tissue]
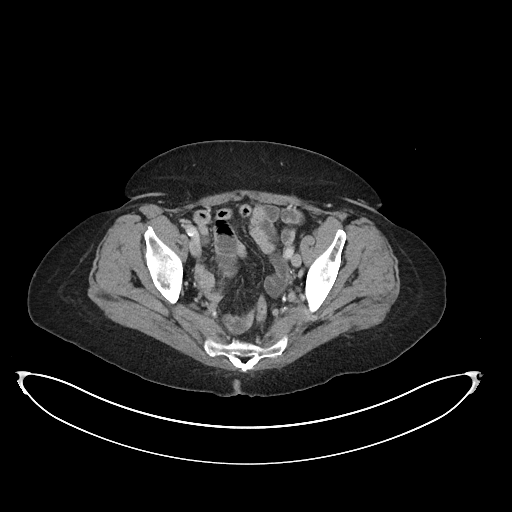
[im 31/91  soft-tissue]
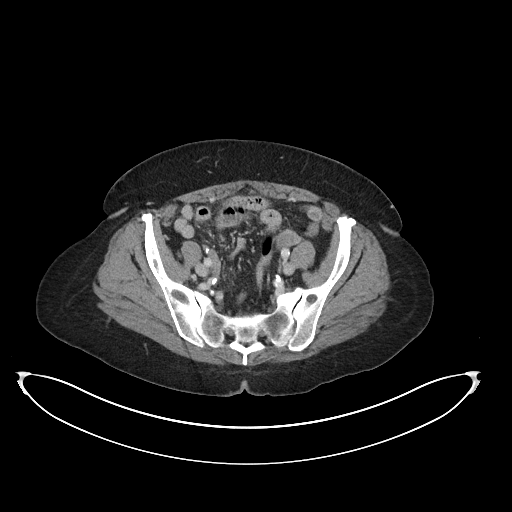
[im 37/91  soft-tissue]
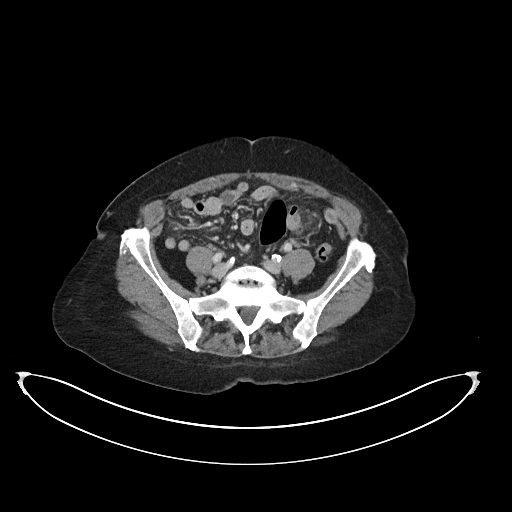
[im 49/91  soft-tissue]
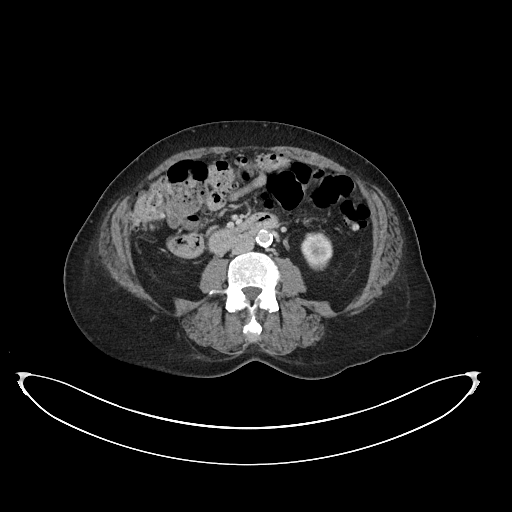
[im 55/91  soft-tissue]
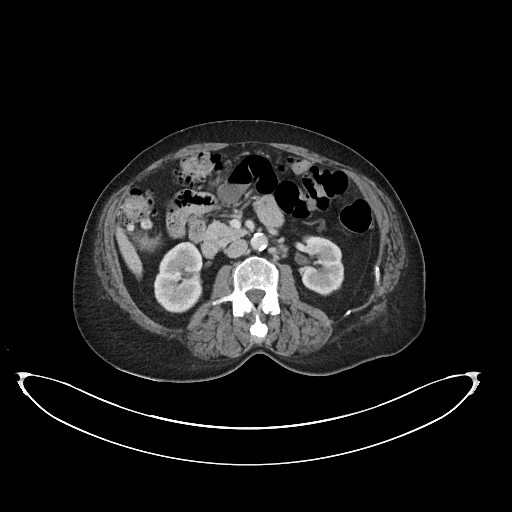
[im 61/91  soft-tissue]
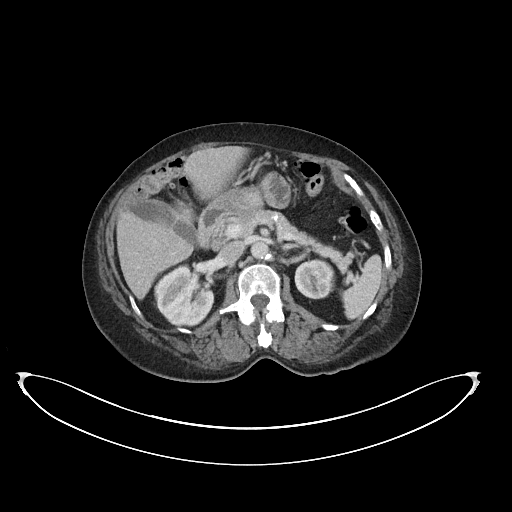
[im 61/91  bone]
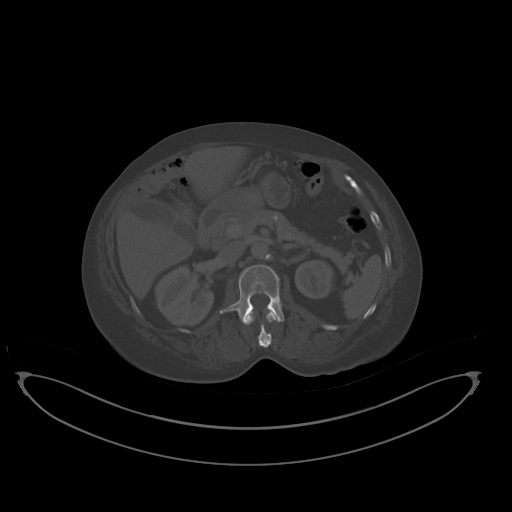
[im 67/91  soft-tissue]
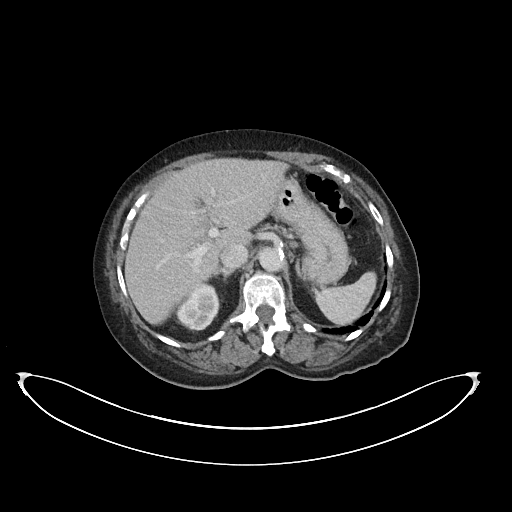
[im 73/91  soft-tissue]
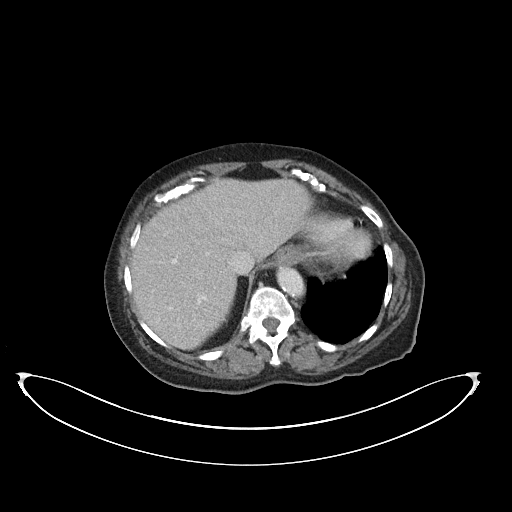
[im 79/91  soft-tissue]
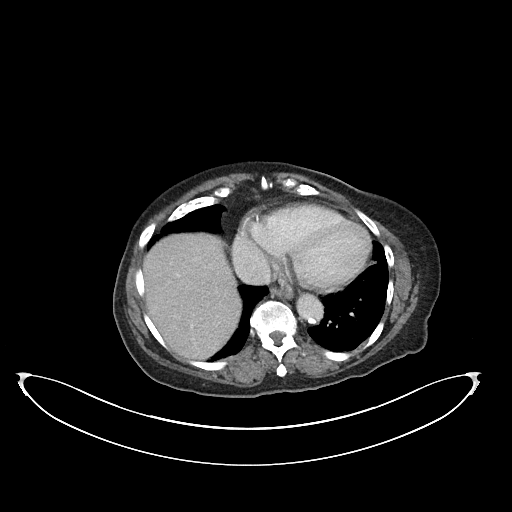
[im 85/91  soft-tissue]
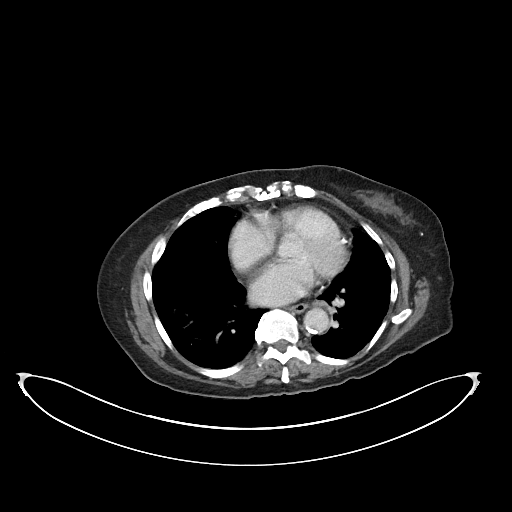

[Series 6: abdomen 3.0 mpr cor · coronal · 0.74mm/px · 3 of 100 slices shown]
[im 34/100  soft-tissue]
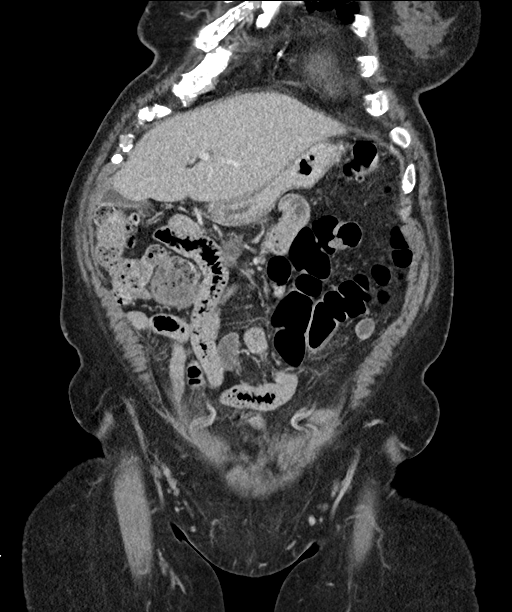
[im 45/100  soft-tissue]
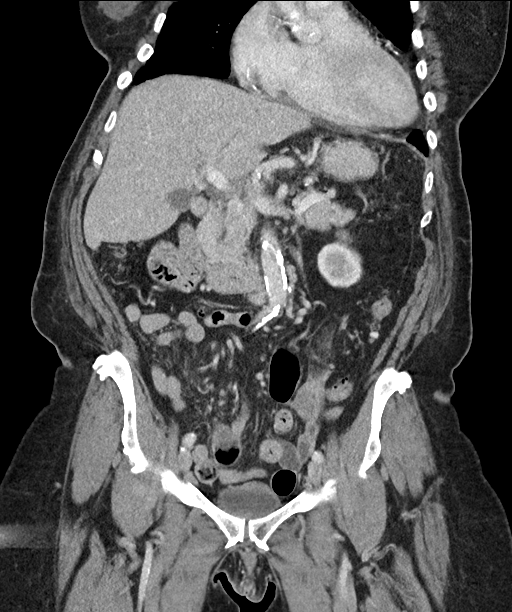
[im 56/100  soft-tissue]
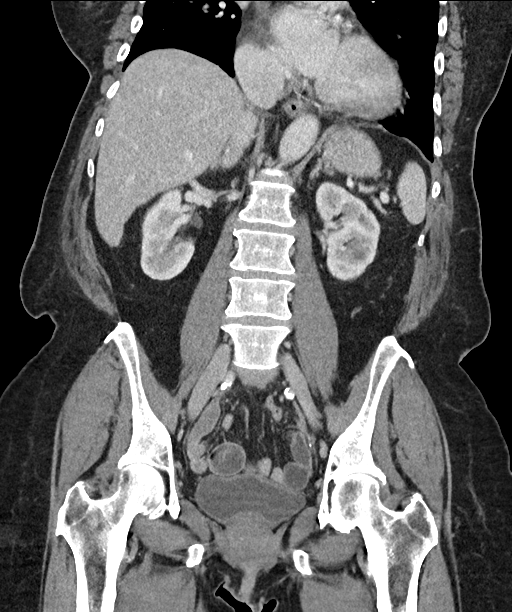

[16 of 46 positions shown; findings below may reference images not displayed]

FINDINGS: Lower chest: 9 mm nodule in the lingula, stable from [IA] and thus
considered benign.

Coronary atherosclerotic calcification.

Hepatobiliary: No focal liver abnormality.No evidence of biliary
obstruction or stone.

Pancreas: Unremarkable.

Spleen: Unremarkable.

Adrenals/Urinary Tract: Negative adrenals. No hydronephrosis or
stone. Few small low densities in the kidney that are too small for
densitometry, likely cysts. Unremarkable bladder.

Stomach/Bowel: Fat stranding and mild colonic wall thickening
surrounding a sigmoid diverticulum. Diverticulosis is extensive and
diffuse. No abscess or pneumoperitoneum. Appendix not visualized. No
pericecal inflammation. The cecum is high in the right abdomen.

Vascular/Lymphatic: No acute vascular abnormality. Extensive
atherosclerotic calcification of the aorta and iliacs. No mass or
adenopathy.

Reproductive:Hysterectomy.  Negative adnexae.

Other: No ascites or pneumoperitoneum.

Musculoskeletal: Lumbar facet arthropathy with mild L4-5
anterolisthesis.
IMPRESSION: 1. Uncomplicated sigmoid diverticulitis.
2. Colonic diverticulosis is extensive.
3.  Aortic Atherosclerosis ([IA]-[IA]).

## 2018-02-10 MED ORDER — MORPHINE SULFATE (PF) 4 MG/ML IV SOLN
4.0000 mg | Freq: Once | INTRAVENOUS | Status: AC
  Administered 2018-02-10: 4 mg via INTRAVENOUS
  Filled 2018-02-10: qty 1

## 2018-02-10 MED ORDER — SODIUM CHLORIDE 0.9 % IV SOLN
1000.0000 mL | INTRAVENOUS | Status: DC
  Administered 2018-02-10: 1000 mL via INTRAVENOUS

## 2018-02-10 MED ORDER — IOPAMIDOL (ISOVUE-300) INJECTION 61%
INTRAVENOUS | Status: AC
  Administered 2018-02-10: 100 mL
  Filled 2018-02-10: qty 100

## 2018-02-10 MED ORDER — ONDANSETRON HCL 4 MG/2ML IJ SOLN
4.0000 mg | Freq: Once | INTRAMUSCULAR | Status: AC
  Administered 2018-02-10: 4 mg via INTRAVENOUS
  Filled 2018-02-10: qty 2

## 2018-02-10 MED ORDER — HYDROCODONE-ACETAMINOPHEN 5-325 MG PO TABS: 1.0000 | ORAL_TABLET | ORAL | 0 refills | Status: DC | PRN

## 2018-02-10 MED ORDER — SODIUM CHLORIDE 0.9 % IV BOLUS (SEPSIS)
500.0000 mL | Freq: Once | INTRAVENOUS | Status: AC
  Administered 2018-02-10: 500 mL via INTRAVENOUS

## 2018-02-10 MED ORDER — AMOXICILLIN-POT CLAVULANATE 875-125 MG PO TABS: 1.0000 | ORAL_TABLET | Freq: Two times a day (BID) | ORAL | 0 refills | Status: DC

## 2018-02-10 MED ORDER — PIPERACILLIN-TAZOBACTAM 3.375 G IVPB 30 MIN
3.3750 g | Freq: Once | INTRAVENOUS | Status: AC
  Administered 2018-02-10: 3.375 g via INTRAVENOUS
  Filled 2018-02-10: qty 50

## 2018-02-10 NOTE — ED Notes (Signed)
Patient transported to CT 

## 2018-02-10 NOTE — ED Triage Notes (Signed)
Pt reports RUQ and LUQ pain that radiates to left side since yesterday morning. Denies n/v, diarrhea. Endorses gas.

## 2018-02-10 NOTE — ED Provider Notes (Signed)
[facility] EMERGENCY DEPARTMENT Provider Note   CSN: [id number] Arrival date & time: 02/10/18  1446     History   Chief Complaint Chief Complaint  Patient presents with  . Abdominal Pain    HPI Elaine Turner is a 72 y.o. female.   Abdominal Pain   This is a new problem. The current episode started yesterday. The problem occurs constantly. The problem has not changed since onset.The pain is located in the periumbilical region (radiates to both sides at the level of the umbilicus). The pain is moderate. Pertinent negatives include fever, nausea, vomiting and dysuria. Nothing aggravates the symptoms. Nothing (tried over the counter antacids and pepto bismol) relieves the symptoms.    Past Medical History:  Diagnosis Date  . Arthritis   . Chronic diarrhea   . COPD  GOLD 0 / active smoker  10/24/2015  . Heart murmur   . Hemorrhoids   . Hyperglycemia 05/01/2013  . Hypertension   . Pneumonia 05/01/2013  . PVD (peripheral vascular disease) ([place])   . Urine incontinence     Patient Active Problem List   Diagnosis Date Noted  . Acute sinus infection 01/08/2018  . Acute respiratory distress 09/30/2017  . AKI (acute kidney injury) ([place])   . COPD with acute exacerbation ([place])   . Cough 08/09/2017  . Weight loss 06/17/2017  . Constipation 06/17/2017  . Skin tag 03/19/2017  . Abnormal breath sounds 03/25/2016  . COPD  GOLD 0 / active smoker  10/24/2015  . Solitary pulmonary nodule 05/31/2015  . Left knee pain 02/09/2014  . Primary localized osteoarthrosis, lower leg 02/09/2014  . Anemia, unspecified 05/15/2013  . Hypokalemia 05/08/2013  . Hemorrhoids 05/02/2013  . CAP (community acquired pneumonia) 05/01/2013  . Hyponatremia 05/01/2013  . CKD (chronic kidney disease), stage II 05/01/2013  . Hyperglycemia 05/01/2013  . Atherosclerosis of native arteries of the extremities with intermittent claudication 10/14/2012  . Osteoarthritis 09/08/2012  . Cigarette  smoker 09/08/2012  . Insomnia 09/08/2012  . Hyperlipidemia 09/08/2012  . Heart murmur, aortic 09/08/2012  . Encounter for well adult exam with abnormal findings 09/02/2012  . Hypertension   . PVD (peripheral vascular disease) ([place])     Past Surgical History:  Procedure Laterality Date  . ABDOMINAL HYSTERECTOMY    . HEMORRHOID SURGERY N/A 06/26/2015   Procedure: INTERNAL AND EXTERNAL HEMORRHOIDECTOMY;  Surgeon: Georganna Skeans, MD;  Location: [facility];  Service: General;  Laterality: N/A;    OB History    No data available       Home Medications    Prior to Admission medications   Medication Sig Start Date End Date Taking? Authorizing Provider  albuterol (PROVENTIL HFA;VENTOLIN HFA) 108 (90 Base) MCG/ACT inhaler Inhale 2 puffs into the lungs every 6 (six) hours as needed for wheezing or shortness of breath. 10/01/17   Mariel Aloe, MD  amoxicillin-clavulanate (AUGMENTIN) 875-125 MG tablet Take 1 tablet by mouth 2 (two) times daily. 02/10/18   Dorie Rank, MD  aspirin 81 MG EC tablet Take 1 tablet (81 mg total) by mouth daily. Swallow whole. 03/19/17   Biagio Borg, MD  benzonatate (TESSALON) 100 MG capsule Take 1 capsule (100 mg total) by mouth 2 (two) times daily as needed for cough. 01/18/18   Marrian Salvage, FNP  fluticasone (FLONASE) 50 MCG/ACT nasal spray Place 2 sprays into both nostrils daily. 01/18/18   Marrian Salvage, FNP  hydrochlorothiazide (HYDRODIURIL) 25 MG tablet Take 1 tablet (25  mg total) by mouth daily. 03/19/17   Biagio Borg, MD  HYDROcodone-acetaminophen (NORCO/VICODIN) 5-325 MG tablet Take 1 tablet by mouth every 4 (four) hours as needed. 02/10/18   Dorie Rank, MD  losartan (COZAAR) 50 MG tablet Take 1 tablet (50 mg total) by mouth daily. 03/19/17   Biagio Borg, MD  NIFEdipine (PROCARDIA XL/ADALAT-CC) 90 MG 24 hr tablet TAKE 1 TABLET (90 MG TOTAL) BY MOUTH DAILY. 03/19/17   Biagio Borg, MD    Family History Family History    Problem Relation Age of Onset  . Hypertension Mother   . Lung cancer Father     Social History Social History   Tobacco Use  . Smoking status: Current Every Day Smoker    Packs/day: 1.50    Years: 58.00    Pack years: 87.00    Types: Cigarettes  . Smokeless tobacco: Never Used  Substance Use Topics  . Alcohol use: Yes    Alcohol/week: 0.6 oz    Types: 1 Glasses of wine per week    Comment: occassional  . Drug use: No     Allergies   Ace inhibitors and Levaquin [levofloxacin]   Review of Systems Review of Systems  Constitutional: Negative for fever.  Gastrointestinal: Positive for abdominal pain. Negative for nausea and vomiting.  Genitourinary: Negative for dysuria.  All other systems reviewed and are negative.    Physical Exam Updated Vital Signs BP (!) 164/77   Pulse 75   Temp 98.3 F (36.8 C) (Oral)   Resp 16   SpO2 91%   Physical Exam   ED Treatments / Results  Labs (all labs ordered are listed, but only abnormal results are displayed) Labs Reviewed  COMPREHENSIVE METABOLIC PANEL - Abnormal; Notable for the following components:      Result Value   Creatinine, Ser 1.44 (*)    Calcium 8.8 (*)    ALT 13 (*)    GFR calc non Af Amer 35 (*)    GFR calc Af Amer 41 (*)    All other components within normal limits  CBC - Abnormal; Notable for the following components:   WBC 18.5 (*)    RBC 5.60 (*)    MCV 76.1 (*)    MCH 24.5 (*)    RDW 15.9 (*)    All other components within normal limits  URINALYSIS, ROUTINE W REFLEX MICROSCOPIC - Abnormal; Notable for the following components:   Protein, ur 30 (*)    Squamous Epithelial / LPF 0-5 (*)    All other components within normal limits  LIPASE, BLOOD     Radiology Ct Abdomen Pelvis W Contrast  Result Date: 02/10/2018 CLINICAL DATA:  Diffuse abdominal pain.  Elevated white count. EXAM: CT ABDOMEN AND PELVIS WITH CONTRAST TECHNIQUE: Multidetector CT imaging of the abdomen and pelvis was performed  using the standard protocol following bolus administration of intravenous contrast. CONTRAST:  145mL ISOVUE-300 IOPAMIDOL (ISOVUE-300) INJECTION 61% COMPARISON:  None. FINDINGS: Lower chest: 9 mm nodule in the lingula, stable from 2016 and thus considered benign. Coronary atherosclerotic calcification. Hepatobiliary: No focal liver abnormality.No evidence of biliary obstruction or stone. Pancreas: Unremarkable. Spleen: Unremarkable. Adrenals/Urinary Tract: Negative adrenals. No hydronephrosis or stone. Few small low densities in the kidney that are too small for densitometry, likely cysts. Unremarkable bladder. Stomach/Bowel: Fat stranding and mild colonic wall thickening surrounding a sigmoid diverticulum. Diverticulosis is extensive and diffuse. No abscess or pneumoperitoneum. Appendix not visualized. No pericecal inflammation. The cecum is high in  the right abdomen. Vascular/Lymphatic: No acute vascular abnormality. Extensive atherosclerotic calcification of the aorta and iliacs. No mass or adenopathy. Reproductive:Hysterectomy.  Negative adnexae. Other: No ascites or pneumoperitoneum. Musculoskeletal: Lumbar facet arthropathy with mild L4-5 anterolisthesis. IMPRESSION: 1. Uncomplicated sigmoid diverticulitis. 2. Colonic diverticulosis is extensive. 3.  Aortic Atherosclerosis (ICD10-I70.0). Electronically Signed   By: Monte Fantasia M.D.   On: 02/10/2018 18:48    Procedures Procedures (including critical care time)  Medications Ordered in ED Medications  sodium chloride 0.9 % bolus 500 mL (0 mLs Intravenous Stopped 02/10/18 1906)    Followed by  0.9 %  sodium chloride infusion (1,000 mLs Intravenous New Bag/Given 02/10/18 1815)  piperacillin-tazobactam (ZOSYN) IVPB 3.375 g (not administered)  morphine 4 MG/ML injection 4 mg (4 mg Intravenous Given 02/10/18 1815)  ondansetron (ZOFRAN) injection 4 mg (4 mg Intravenous Given 02/10/18 1814)  iopamidol (ISOVUE-300) 61 % injection (100 mLs  Contrast Given  02/10/18 1824)     Initial Impression / Assessment and Plan / ED Course  I have reviewed the triage vital signs and the nursing notes.  Pertinent labs & imaging results that were available during my care of the patient were reviewed by me and considered in my medical decision making (see chart for details).  Clinical Course as of Feb 11 1912  Thu Feb 10, 2018  [place] reviewed.  Chronic renal insufficiency is unchanged  [JK]    Clinical Course User Index [JK] Dorie Rank, MD  Patient presented to the emergency room for evaluation of abdominal pain.  Patient's laboratory tests were notable for an elevated white blood cell count.  Otherwise her laboratory tests were reassuring.  CT scan of the abdomen pelvis was performed.  Patient has uncomplicated acute diverticulitis without signs of abscess or perforation.  I discussed the findings with the patient.  I offered inpatient treatment.  Patient states she would prefer to try to go home on oral antibiotics.  She understands she should return if she starts having fever vomiting or worsening symptoms.  She should otherwise follow-up with her primary care doctor to make sure she is improving.  Patient and her daughter are comfortable with this plan   Final Clinical Impressions(s) / ED Diagnoses   Final diagnoses:  Diverticulitis of large intestine without perforation or abscess without bleeding    ED Discharge Orders        Ordered    amoxicillin-clavulanate (AUGMENTIN) 875-125 MG tablet  2 times daily     02/10/18 1911    HYDROcodone-acetaminophen (NORCO/VICODIN) 5-325 MG tablet  Every 4 hours PRN     02/10/18 1911       Dorie Rank, MD 02/10/18 1914

## 2018-02-10 NOTE — Discharge Instructions (Signed)
Take the antibiotics as prescribed.  Return to the emergency room if you start developing fever, vomiting, or worsening symptoms

## 2018-02-14 ENCOUNTER — Ambulatory Visit: Payer: Medicare Other | Admitting: Family

## 2018-02-15 ENCOUNTER — Other Ambulatory Visit: Payer: Self-pay

## 2018-02-15 ENCOUNTER — Ambulatory Visit (INDEPENDENT_AMBULATORY_CARE_PROVIDER_SITE_OTHER): Payer: Medicare Other | Admitting: Internal Medicine

## 2018-02-15 ENCOUNTER — Encounter: Payer: Self-pay | Admitting: Internal Medicine

## 2018-02-15 VITALS — BP 144/88 | HR 71 | Temp 98.1°F | Ht 62.0 in | Wt 143.0 lb

## 2018-02-15 DIAGNOSIS — K5732 Diverticulitis of large intestine without perforation or abscess without bleeding: Secondary | ICD-10-CM | POA: Diagnosis not present

## 2018-02-15 DIAGNOSIS — J449 Chronic obstructive pulmonary disease, unspecified: Secondary | ICD-10-CM | POA: Diagnosis not present

## 2018-02-15 DIAGNOSIS — N182 Chronic kidney disease, stage 2 (mild): Secondary | ICD-10-CM

## 2018-02-15 DIAGNOSIS — I10 Essential (primary) hypertension: Secondary | ICD-10-CM | POA: Diagnosis not present

## 2018-02-15 MED ORDER — HYDROCODONE-ACETAMINOPHEN 5-325 MG PO TABS: 1.0000 | ORAL_TABLET | ORAL | 0 refills | Status: DC | PRN

## 2018-02-15 NOTE — Assessment & Plan Note (Signed)
stable overall by history and exam, and pt to continue medical treatment as before,  to f/u any worsening symptoms or concerns 

## 2018-02-15 NOTE — Assessment & Plan Note (Signed)
Acute, improving overall, to finish antibx, for pain control, but does not appear to need further evaluatoin or other tx

## 2018-02-15 NOTE — Assessment & Plan Note (Signed)
stable overall by history and exam, recent data reviewed with pt, and pt to continue medical treatment as before,  to f/u any worsening symptoms or concerns Lab Results  Component Value Date   CREATININE 1.44 (H) 02/10/2018

## 2018-02-15 NOTE — Patient Outreach (Signed)
[facility]) Care Management  02/15/2018  Elaine Turner [birth year] [id number]   Medication Adherence call to Elaine Turner patient is showing past due under [place] health Care Ins.on Losartan 50 mg spoke with patient had some medication but, medication was expired offer patient to call [facility] and order her a new batch she was very happy she could get help with her medication, I also ask if there was anything else that we can assist her with any pill box or if she wants a pharmacist to go over her medication, she just ask if we can call the pharmacy and order her medications.  [facility] Direct Dial [phone]  Fax [phone] [email or web address]

## 2018-02-15 NOTE — Progress Notes (Signed)
Subjective:    Patient ID: Elaine Turner, female    DOB: 07-Apr-1946, 72 y.o.   MRN: [id number]  HPI  Here with recent new dz uncomplicated left sigmoid diverticulosis by CT feb 14, tx with augmentin and hydrocodone (the latter she denies given the written rx); asking for pain control though can say overall left sided pain less, now to 5/10.  Denies worsening reflux, othe rabd pain, dysphagia, n/v, or blood.  Pt denies chest pain, increased sob or doe, wheezing, orthopnea, PND, increased LE swelling, palpitations, dizziness or syncope.   Pt denies polydipsia, polyuria  Last colonoscopy 2014.  No other new complaints Past Medical History:  Diagnosis Date  . Arthritis   . Chronic diarrhea   . COPD  GOLD 0 / active smoker  10/24/2015  . Heart murmur   . Hemorrhoids   . Hyperglycemia 05/01/2013  . Hypertension   . Pneumonia 05/01/2013  . PVD (peripheral vascular disease) ([place])   . Urine incontinence    Past Surgical History:  Procedure Laterality Date  . ABDOMINAL HYSTERECTOMY    . HEMORRHOID SURGERY N/A 06/26/2015   Procedure: INTERNAL AND EXTERNAL HEMORRHOIDECTOMY;  Surgeon: Georganna Skeans, MD;  Location: [facility];  Service: General;  Laterality: N/A;    reports that she has been smoking cigarettes.  She has a 87.00 pack-year smoking history. she has never used smokeless tobacco. She reports that she drinks about 0.6 oz of alcohol per week. She reports that she does not use drugs. family history includes Hypertension in her mother; Lung cancer in her father. Allergies  Allergen Reactions  . Ace Inhibitors Cough  . Levaquin [Levofloxacin] Other (See Comments)    GI upset   Current Outpatient Medications on File Prior to Visit  Medication Sig Dispense Refill  . albuterol (PROVENTIL HFA;VENTOLIN HFA) 108 (90 Base) MCG/ACT inhaler Inhale 2 puffs into the lungs every 6 (six) hours as needed for wheezing or shortness of breath. 1 Inhaler 2  . amoxicillin-clavulanate  (AUGMENTIN) 875-125 MG tablet Take 1 tablet by mouth 2 (two) times daily. 20 tablet 0  . aspirin 81 MG EC tablet Take 1 tablet (81 mg total) by mouth daily. Swallow whole. 30 tablet 12  . benzonatate (TESSALON) 100 MG capsule Take 1 capsule (100 mg total) by mouth 2 (two) times daily as needed for cough. 60 capsule 0  . fluticasone (FLONASE) 50 MCG/ACT nasal spray Place 2 sprays into both nostrils daily. 16 g 6  . hydrochlorothiazide (HYDRODIURIL) 25 MG tablet Take 1 tablet (25 mg total) by mouth daily. 90 tablet 3  . losartan (COZAAR) 50 MG tablet Take 1 tablet (50 mg total) by mouth daily. 90 tablet 3  . NIFEdipine (PROCARDIA XL/ADALAT-CC) 90 MG 24 hr tablet TAKE 1 TABLET (90 MG TOTAL) BY MOUTH DAILY. 90 tablet 3   No current facility-administered medications on file prior to visit.    Review of Systems  Constitutional: Negative for other unusual diaphoresis or sweats HENT: Negative for ear discharge or swelling Eyes: Negative for other worsening visual disturbances Respiratory: Negative for stridor or other swelling  Gastrointestinal: Negative for worsening distension or other blood Genitourinary: Negative for retention or other urinary change Musculoskeletal: Negative for other MSK pain or swelling Skin: Negative for color change or other new lesions Neurological: Negative for worsening tremors and other numbness  Psychiatric/Behavioral: Negative for worsening agitation or other fatigue All other system neg per pt    Objective:   Physical Exam BP (!) 144/88  Pulse 71   Temp 98.1 F (36.7 C) (Oral)   Ht 5\' 2"  (1.575 m)   Wt 143 lb (64.9 kg)   SpO2 96%   BMI 26.16 kg/m  VS noted,  Constitutional: Pt appears in NAD HENT: Head: NCAT.  Right Ear: External ear normal.  Left Ear: External ear normal.  Eyes: . Pupils are equal, round, and reactive to light. Conjunctivae and EOM are normal Nose: without d/c or deformity Neck: Neck supple. Gross normal ROM Cardiovascular: Normal  rate and regular rhythm.   Pulmonary/Chest: Effort normal and breath sounds without rales or wheezing.  Abd:  Soft, ND, + BS, no organomegaly, with mild LLQ tender to deep palpation, no guarding or rebound Neurological: Pt is alert. At baseline orientation, motor grossly intact Skin: Skin is warm. No rashes, other new lesions, no LE edema Psychiatric: Pt behavior is normal without agitation , mild nervous No other exam findings  Lab Results  Component Value Date   WBC 18.5 (H) 02/10/2018   HGB 13.7 02/10/2018   HCT 42.6 02/10/2018   PLT 358 02/10/2018   GLUCOSE 91 02/10/2018   CHOL 175 01/06/2018   TRIG 97.0 01/06/2018   HDL 64.30 01/06/2018   LDLCALC 91 01/06/2018   ALT 13 (L) 02/10/2018   AST 17 02/10/2018   NA 139 02/10/2018   K 3.5 02/10/2018   CL 102 02/10/2018   CREATININE 1.44 (H) 02/10/2018   BUN 20 02/10/2018   CO2 24 02/10/2018   TSH 0.64 01/06/2018   HGBA1C 5.7 01/06/2018  Lipase 35  - 02/10/2018  02/10/2018  - CT ABDOMEN AND PELVIS WITH CONTRAST - summary only IMPRESSION: 1. Uncomplicated sigmoid diverticulitis. 2. Colonic diverticulosis is extensive. 3.  Aortic Atherosclerosis (ICD10-I70.0).    Assessment & Plan:

## 2018-02-15 NOTE — Assessment & Plan Note (Signed)
Mild elev likely situational, improving, o/w stable overall by history and exam, recent data reviewed with pt, and pt to continue medical treatment as before,  to f/u any worsening symptoms or concerns BP Readings from Last 3 Encounters:  02/15/18 (!) 144/88  02/10/18 (!) 155/59  01/18/18 130/68

## 2018-02-15 NOTE — Patient Instructions (Addendum)
Please take all new medication as prescribed - the pain medication  Please continue all other medication, including to finish the antibiotic  It does not appear that you would need further lab work, xrays or colonoscopy at this time  Please have the pharmacy call with any other refills you may need.  Please continue your efforts at being more active, low cholesterol diet, and weight control.  Please keep your appointments with your specialists as you may have planned

## 2018-04-10 ENCOUNTER — Other Ambulatory Visit: Payer: Self-pay | Admitting: Internal Medicine

## 2018-05-16 ENCOUNTER — Other Ambulatory Visit: Payer: Self-pay | Admitting: Internal Medicine

## 2018-07-11 ENCOUNTER — Ambulatory Visit (INDEPENDENT_AMBULATORY_CARE_PROVIDER_SITE_OTHER): Payer: Medicare Other | Admitting: Internal Medicine

## 2018-07-11 ENCOUNTER — Encounter: Payer: Self-pay | Admitting: Internal Medicine

## 2018-07-11 DIAGNOSIS — M19041 Primary osteoarthritis, right hand: Secondary | ICD-10-CM | POA: Diagnosis not present

## 2018-07-11 DIAGNOSIS — J449 Chronic obstructive pulmonary disease, unspecified: Secondary | ICD-10-CM

## 2018-07-11 DIAGNOSIS — J441 Chronic obstructive pulmonary disease with (acute) exacerbation: Secondary | ICD-10-CM | POA: Diagnosis not present

## 2018-07-11 DIAGNOSIS — K5900 Constipation, unspecified: Secondary | ICD-10-CM | POA: Diagnosis not present

## 2018-07-11 MED ORDER — AZITHROMYCIN 250 MG PO TABS: ORAL_TABLET | ORAL | 0 refills | Status: DC

## 2018-07-11 MED ORDER — METHYLPREDNISOLONE ACETATE 80 MG/ML IJ SUSP
80.0000 mg | Freq: Once | INTRAMUSCULAR | Status: AC
Start: 2018-07-11 — End: 2018-07-11
  Administered 2018-07-11: 80 mg via INTRAMUSCULAR

## 2018-07-11 MED ORDER — LINACLOTIDE 290 MCG PO CAPS: 290.0000 ug | ORAL_CAPSULE | Freq: Every day | ORAL | 11 refills | Status: DC | PRN

## 2018-07-11 MED ORDER — METHYLPREDNISOLONE 4 MG PO TBPK: ORAL_TABLET | ORAL | 0 refills | Status: DC

## 2018-07-11 NOTE — Assessment & Plan Note (Addendum)
Depo-medrol IM Medrol pack Zpac

## 2018-07-11 NOTE — Assessment & Plan Note (Addendum)
Chronic Rx - [place] F/u w/Dr Jenny Reichmann GI ref if needed

## 2018-07-11 NOTE — Patient Instructions (Signed)
You can use over-the-counter  "cold" medicines  such as  "Afrin" nasal spray for nasal congestion as directed. Use " Delsym" or" Robitussin" cough syrup varietis for cough.  You can use plain "Tylenol" or "Advil" for fever, chills and achyness. Use Halls or Ricola cough drops.     Please, make an appointment if you are not better or if you're worse.  

## 2018-07-11 NOTE — Progress Notes (Signed)
Subjective:  Patient ID: Elaine Turner, female    DOB: 1946/08/14  Age: 72 y.o. MRN: [id number]  CC: No chief complaint on file.   HPI Elaine Turner presents for chronic constipation - worse BM's 2/week. Colon 2014 C/o URI sx's x 4 d. Mucus is clear. No fever. Smoker C/o hand OA - finger pain   Outpatient Medications Prior to Visit  Medication Sig Dispense Refill  . albuterol (PROVENTIL HFA;VENTOLIN HFA) 108 (90 Base) MCG/ACT inhaler Inhale 2 puffs into the lungs every 6 (six) hours as needed for wheezing or shortness of breath. 1 Inhaler 2  . amoxicillin-clavulanate (AUGMENTIN) 875-125 MG tablet Take 1 tablet by mouth 2 (two) times daily. 20 tablet 0  . aspirin 81 MG EC tablet Take 1 tablet (81 mg total) by mouth daily. Swallow whole. 30 tablet 12  . benzonatate (TESSALON) 100 MG capsule Take 1 capsule (100 mg total) by mouth 2 (two) times daily as needed for cough. 60 capsule 0  . fluticasone (FLONASE) 50 MCG/ACT nasal spray Place 2 sprays into both nostrils daily. 16 g 6  . hydrochlorothiazide (HYDRODIURIL) 25 MG tablet TAKE 1 TABLET (25 MG TOTAL) BY MOUTH DAILY. 90 tablet 1  . HYDROcodone-acetaminophen (NORCO/VICODIN) 5-325 MG tablet Take 1 tablet by mouth every 4 (four) hours as needed for moderate pain or severe pain. 20 tablet 0  . losartan (COZAAR) 50 MG tablet TAKE 1 TABLET (50 MG TOTAL) BY MOUTH DAILY. 90 tablet 1  . NIFEdipine (PROCARDIA XL/ADALAT-CC) 90 MG 24 hr tablet TAKE 1 TABLET (90 MG TOTAL) BY MOUTH DAILY. 90 tablet 3   No facility-administered medications prior to visit.     ROS: Review of Systems  Constitutional: Negative for activity change, appetite change, chills, fatigue and unexpected weight change.  HENT: Positive for congestion and rhinorrhea. Negative for mouth sores and sinus pressure.   Eyes: Negative for visual disturbance.  Respiratory: Positive for cough. Negative for chest tightness.   Gastrointestinal: Positive for constipation. Negative for  abdominal pain and nausea.  Genitourinary: Negative for difficulty urinating, frequency and vaginal pain.  Musculoskeletal: Positive for arthralgias. Negative for back pain and gait problem.  Skin: Negative for pallor and rash.  Neurological: Negative for dizziness, tremors, weakness, numbness and headaches.  Psychiatric/Behavioral: Negative for confusion and sleep disturbance.    Objective:  BP (!) 146/74 (BP Location: Left Arm, Patient Position: Sitting, Cuff Size: Normal)   Pulse 68   Temp 98 F (36.7 C) (Oral)   Ht 5\' 2"  (1.575 m)   Wt 144 lb (65.3 kg)   SpO2 98%   BMI 26.34 kg/m   BP Readings from Last 3 Encounters:  07/11/18 (!) 146/74  02/15/18 (!) 144/88  02/10/18 (!) 155/59    Wt Readings from Last 3 Encounters:  07/11/18 144 lb (65.3 kg)  02/15/18 143 lb (64.9 kg)  01/18/18 147 lb (66.7 kg)    Physical Exam  Constitutional: She appears well-developed. No distress.  HENT:  Head: Normocephalic.  Right Ear: External ear normal.  Left Ear: External ear normal.  Nose: Nose normal.  Mouth/Throat: Oropharynx is clear and moist.  Eyes: Pupils are equal, round, and reactive to light. Conjunctivae are normal. Right eye exhibits no discharge. Left eye exhibits no discharge.  Neck: Normal range of motion. Neck supple. No JVD present. No tracheal deviation present. No thyromegaly present.  Cardiovascular: Normal rate, regular rhythm and normal heart sounds.  Pulmonary/Chest: No stridor. No respiratory distress. She has no wheezes.  Abdominal:  Soft. Bowel sounds are normal. She exhibits no distension and no mass. There is no tenderness. There is no rebound and no guarding.  Musculoskeletal: She exhibits no edema or tenderness.  Lymphadenopathy:    She has no cervical adenopathy.  Neurological: She displays normal reflexes. No cranial nerve deficit. She exhibits normal muscle tone. Coordination normal.  Skin: No rash noted. No erythema.  Psychiatric: She has a normal mood  and affect. Her behavior is normal. Judgment and thought content normal.  eryth throat R index deformed and tender  Lab Results  Component Value Date   WBC 18.5 (H) 02/10/2018   HGB 13.7 02/10/2018   HCT 42.6 02/10/2018   PLT 358 02/10/2018   GLUCOSE 91 02/10/2018   CHOL 175 01/06/2018   TRIG 97.0 01/06/2018   HDL 64.30 01/06/2018   LDLCALC 91 01/06/2018   ALT 13 (L) 02/10/2018   AST 17 02/10/2018   NA 139 02/10/2018   K 3.5 02/10/2018   CL 102 02/10/2018   CREATININE 1.44 (H) 02/10/2018   BUN 20 02/10/2018   CO2 24 02/10/2018   TSH 0.64 01/06/2018   HGBA1C 5.7 01/06/2018    Ct Abdomen Pelvis W Contrast  Result Date: 02/10/2018 CLINICAL DATA:  Diffuse abdominal pain.  Elevated white count. EXAM: CT ABDOMEN AND PELVIS WITH CONTRAST TECHNIQUE: Multidetector CT imaging of the abdomen and pelvis was performed using the standard protocol following bolus administration of intravenous contrast. CONTRAST:  15mL ISOVUE-300 IOPAMIDOL (ISOVUE-300) INJECTION 61% COMPARISON:  None. FINDINGS: Lower chest: 9 mm nodule in the lingula, stable from 2016 and thus considered benign. Coronary atherosclerotic calcification. Hepatobiliary: No focal liver abnormality.No evidence of biliary obstruction or stone. Pancreas: Unremarkable. Spleen: Unremarkable. Adrenals/Urinary Tract: Negative adrenals. No hydronephrosis or stone. Few small low densities in the kidney that are too small for densitometry, likely cysts. Unremarkable bladder. Stomach/Bowel: Fat stranding and mild colonic wall thickening surrounding a sigmoid diverticulum. Diverticulosis is extensive and diffuse. No abscess or pneumoperitoneum. Appendix not visualized. No pericecal inflammation. The cecum is high in the right abdomen. Vascular/Lymphatic: No acute vascular abnormality. Extensive atherosclerotic calcification of the aorta and iliacs. No mass or adenopathy. Reproductive:Hysterectomy.  Negative adnexae. Other: No ascites or  pneumoperitoneum. Musculoskeletal: Lumbar facet arthropathy with mild L4-5 anterolisthesis. IMPRESSION: 1. Uncomplicated sigmoid diverticulitis. 2. Colonic diverticulosis is extensive. 3.  Aortic Atherosclerosis (ICD10-I70.0). Electronically Signed   By: Monte Fantasia M.D.   On: 02/10/2018 18:48    Assessment & Plan:   There are no diagnoses linked to this encounter.   No orders of the defined types were placed in this encounter.    Follow-up: No follow-ups on file.  Walker Kehr, MD

## 2018-07-11 NOTE — Assessment & Plan Note (Signed)
Depo-medrol IM Medrol pack

## 2018-07-11 NOTE — Assessment & Plan Note (Signed)
R index finger flare-up Medrol dose-pack

## 2018-07-11 NOTE — Addendum Note (Signed)
Addended by: Karren Cobble on: 07/11/2018 01:12 PM   Modules accepted: Orders

## 2018-07-20 ENCOUNTER — Telehealth: Payer: Self-pay | Admitting: Internal Medicine

## 2018-07-20 MED ORDER — AZITHROMYCIN 250 MG PO TABS: ORAL_TABLET | ORAL | 0 refills | Status: DC

## 2018-07-20 NOTE — Telephone Encounter (Signed)
Please advise, pt seen you last week

## 2018-07-20 NOTE — Telephone Encounter (Signed)
Copied from [place] [id number]. Topic: General - Other >> Jul 20, 2018  9:44 AM Elaine Turner, NT wrote: Reason for CRM: Patient was in on 07/11/18 and  is still having cold symptoms and would like something else called in the azithromycin (ZITHROMAX Z-PAK) 250 MG tablet  she was prescribed did not work, she is still having cold symptoms and she would like   something else called in to    [id number] - [place], [place] [phone] (Phone) [phone] (Fax)

## 2018-07-20 NOTE — Telephone Encounter (Signed)
Ok to repeat a Z pac Thx

## 2018-07-21 NOTE — Telephone Encounter (Signed)
Pt.notified

## 2018-07-27 ENCOUNTER — Ambulatory Visit: Payer: Medicare Other | Admitting: Family Medicine

## 2018-07-27 DIAGNOSIS — Z0289 Encounter for other administrative examinations: Secondary | ICD-10-CM

## 2018-07-27 NOTE — Telephone Encounter (Signed)
Pt called and stated that medication is still not working. Please advise Cb#[phone]

## 2018-07-27 NOTE — Telephone Encounter (Signed)
Appointment scheduled.

## 2018-07-27 NOTE — Telephone Encounter (Signed)
I would suggest ROV

## 2018-07-28 ENCOUNTER — Encounter: Payer: Self-pay | Admitting: Internal Medicine

## 2018-07-28 ENCOUNTER — Ambulatory Visit (INDEPENDENT_AMBULATORY_CARE_PROVIDER_SITE_OTHER): Payer: Medicare Other | Admitting: Internal Medicine

## 2018-07-28 VITALS — BP 136/84 | HR 77 | Temp 98.2°F | Ht 62.0 in | Wt 143.0 lb

## 2018-07-28 DIAGNOSIS — J309 Allergic rhinitis, unspecified: Secondary | ICD-10-CM | POA: Insufficient documentation

## 2018-07-28 DIAGNOSIS — H1010 Acute atopic conjunctivitis, unspecified eye: Secondary | ICD-10-CM | POA: Insufficient documentation

## 2018-07-28 DIAGNOSIS — H1013 Acute atopic conjunctivitis, bilateral: Secondary | ICD-10-CM | POA: Diagnosis not present

## 2018-07-28 DIAGNOSIS — Z0000 Encounter for general adult medical examination without abnormal findings: Secondary | ICD-10-CM

## 2018-07-28 DIAGNOSIS — I10 Essential (primary) hypertension: Secondary | ICD-10-CM

## 2018-07-28 MED ORDER — METHYLPREDNISOLONE ACETATE 80 MG/ML IJ SUSP
80.0000 mg | Freq: Once | INTRAMUSCULAR | Status: AC
  Administered 2018-07-28: 80 mg via INTRAMUSCULAR

## 2018-07-28 MED ORDER — PREDNISONE 10 MG PO TABS: ORAL_TABLET | ORAL | 0 refills | Status: DC

## 2018-07-28 MED ORDER — FLUTICASONE PROPIONATE 50 MCG/ACT NA SUSP: 2.0000 | Freq: Every day | NASAL | 6 refills | Status: DC

## 2018-07-28 MED ORDER — CETIRIZINE HCL 10 MG PO TABS: 10.0000 mg | ORAL_TABLET | Freq: Every day | ORAL | 11 refills | Status: DC | PRN

## 2018-07-28 MED ORDER — AZELASTINE HCL 0.05 % OP SOLN: 1.0000 [drp] | Freq: Two times a day (BID) | OPHTHALMIC | 12 refills | Status: DC | PRN

## 2018-07-28 NOTE — Assessment & Plan Note (Addendum)
Mild to mod, for depomedrol IM 80, predpac asd, zyrtec//nasacort asd,  to f/u any worsening symptoms or concerns

## 2018-07-28 NOTE — Assessment & Plan Note (Signed)
stable overall by history and exam, recent data reviewed with pt, and pt to continue medical treatment as before,  to f/u any worsening symptoms or concerns BP Readings from Last 3 Encounters:  07/28/18 136/84  07/11/18 (!) 146/74  02/15/18 (!) 144/88

## 2018-07-28 NOTE — Progress Notes (Signed)
Subjective:    Patient ID: Elaine Turner, female    DOB: 02-07-1946, 72 y.o.   MRN: [id number]  HPI   Here with "cold symptoms" and hx of multilobar pna 09/2017; Does have several wks ongoing nasal allergy symptoms with clearish congestion, itch and sneezing, without fever, pain, ST, cough, swelling or wheezing.  Eyes are involved as well with itchy swelling and slight d/c.  Pt denies chest pain, increased sob or doe, wheezing, orthopnea, PND, increased LE swelling, palpitations, dizziness or syncope.  Recent constipation better with linzess.  Denies worsening reflux, abd pain, dysphagia, n/v, bowel change or blood. Past Medical History:  Diagnosis Date  . Arthritis   . Chronic diarrhea   . COPD  GOLD 0 / active smoker  10/24/2015  . Heart murmur   . Hemorrhoids   . Hyperglycemia 05/01/2013  . Hypertension   . Pneumonia 05/01/2013  . PVD (peripheral vascular disease) ([place])   . Urine incontinence    Past Surgical History:  Procedure Laterality Date  . ABDOMINAL HYSTERECTOMY    . HEMORRHOID SURGERY N/A 06/26/2015   Procedure: INTERNAL AND EXTERNAL HEMORRHOIDECTOMY;  Surgeon: Georganna Skeans, MD;  Location: [facility];  Service: General;  Laterality: N/A;    reports that she has been smoking cigarettes.  She has a 87.00 pack-year smoking history. She has never used smokeless tobacco. She reports that she drinks about 0.6 oz of alcohol per week. She reports that she does not use drugs. family history includes Hypertension in her mother; Lung cancer in her father. Allergies  Allergen Reactions  . Ace Inhibitors Cough  . Levaquin [Levofloxacin] Other (See Comments)    GI upset   Current Outpatient Medications on File Prior to Visit  Medication Sig Dispense Refill  . albuterol (PROVENTIL HFA;VENTOLIN HFA) 108 (90 Base) MCG/ACT inhaler Inhale 2 puffs into the lungs every 6 (six) hours as needed for wheezing or shortness of breath. 1 Inhaler 2  . aspirin 81 MG EC tablet Take 1  tablet (81 mg total) by mouth daily. Swallow whole. 30 tablet 12  . hydrochlorothiazide (HYDRODIURIL) 25 MG tablet TAKE 1 TABLET (25 MG TOTAL) BY MOUTH DAILY. 90 tablet 1  . linaclotide (LINZESS) 290 MCG CAPS capsule Take 1 capsule (290 mcg total) by mouth daily as needed. 30 capsule 11  . losartan (COZAAR) 50 MG tablet TAKE 1 TABLET (50 MG TOTAL) BY MOUTH DAILY. 90 tablet 1  . NIFEdipine (PROCARDIA XL/ADALAT-CC) 90 MG 24 hr tablet TAKE 1 TABLET (90 MG TOTAL) BY MOUTH DAILY. 90 tablet 3   No current facility-administered medications on file prior to visit.    Review of Systems  Constitutional: Negative for other unusual diaphoresis or sweats HENT: Negative for ear discharge or swelling Eyes: Negative for other worsening visual disturbances Respiratory: Negative for stridor or other swelling  Gastrointestinal: Negative for worsening distension or other blood Genitourinary: Negative for retention or other urinary change Musculoskeletal: Negative for other MSK pain or swelling Skin: Negative for color change or other new lesions Neurological: Negative for worsening tremors and other numbness  Psychiatric/Behavioral: Negative for worsening agitation or other fatigue All other system neg per pt    Objective:   Physical Exam BP 136/84   Pulse 77   Temp 98.2 F (36.8 C) (Oral)   Ht 5\' 2"  (1.575 m)   Wt 143 lb (64.9 kg)   SpO2 95%   BMI 26.16 kg/m  VS noted, not aill appaering Constitutional: Pt appears in NAD  HENT: Head: NCAT.  Right Ear: External ear normal.  Left Ear: External ear normal.  Bilat tm's with mild erythema.  Max sinus areas non tender.  Pharynx with mild erythema, no exudate Eyes: . Pupils are equal, round, and reactive to light. Conjunctivae erythema, slight d/c and EOM are normal Nose: without d/c or deformity Neck: Neck supple. Gross normal ROM Cardiovascular: Normal rate and regular rhythm.   Pulmonary/Chest: Effort normal and breath sounds without rales or  wheezing.  Neurological: Pt is alert. At baseline orientation, motor grossly intact Skin: Skin is warm. No rashes, other new lesions, no LE edema Psychiatric: Pt behavior is normal without agitation  No other exam findings     Assessment & Plan:

## 2018-07-28 NOTE — Patient Instructions (Addendum)
You had the steroid shot today  Please take all new medication as prescribed - the prednisone, optivar eye drops, and zyrtec as needed  Please continue all other medications as before, including the flonase  Please have the pharmacy call with any other refills you may need.  Please continue your efforts at being more active, low cholesterol diet, and weight control.  Please keep your appointments with your specialists as you may have planned  Please return in 6 months, or sooner if needed, with Lab testing done 3-5 days before

## 2018-07-28 NOTE — Assessment & Plan Note (Signed)
For optivar asd,  to f/u any worsening symptoms or concerns

## 2018-08-02 ENCOUNTER — Telehealth: Payer: Self-pay | Admitting: Emergency Medicine

## 2018-08-02 NOTE — Telephone Encounter (Signed)
Called patient to schedule AWV. Patient declined at this time. 

## 2018-08-16 ENCOUNTER — Ambulatory Visit: Payer: Medicare Other | Admitting: Internal Medicine

## 2018-08-16 DIAGNOSIS — Z0289 Encounter for other administrative examinations: Secondary | ICD-10-CM

## 2018-08-18 ENCOUNTER — Ambulatory Visit (INDEPENDENT_AMBULATORY_CARE_PROVIDER_SITE_OTHER)
Admission: RE | Admit: 2018-08-18 | Discharge: 2018-08-18 | Disposition: A | Discharge status: Home / Self Care | Payer: Medicare Other | Source: Ambulatory Visit | Attending: Internal Medicine | Admitting: Internal Medicine

## 2018-08-18 ENCOUNTER — Other Ambulatory Visit (INDEPENDENT_AMBULATORY_CARE_PROVIDER_SITE_OTHER): Payer: Medicare Other

## 2018-08-18 ENCOUNTER — Encounter: Payer: Self-pay | Admitting: Internal Medicine

## 2018-08-18 ENCOUNTER — Ambulatory Visit (INDEPENDENT_AMBULATORY_CARE_PROVIDER_SITE_OTHER): Payer: Medicare Other | Admitting: Internal Medicine

## 2018-08-18 VITALS — BP 158/68 | HR 91 | Temp 98.3°F | Ht 62.0 in | Wt 144.4 lb

## 2018-08-18 DIAGNOSIS — Z0000 Encounter for general adult medical examination without abnormal findings: Secondary | ICD-10-CM | POA: Diagnosis not present

## 2018-08-18 DIAGNOSIS — R109 Unspecified abdominal pain: Secondary | ICD-10-CM

## 2018-08-18 DIAGNOSIS — K5900 Constipation, unspecified: Secondary | ICD-10-CM | POA: Diagnosis not present

## 2018-08-18 LAB — CBC WITH DIFFERENTIAL/PLATELET
Basophils Absolute: 0.1 10*3/uL (ref 0.0–0.1)
Basophils Relative: 0.7 % (ref 0.0–3.0)
Eosinophils Absolute: 0.3 10*3/uL (ref 0.0–0.7)
Eosinophils Relative: 2.2 % (ref 0.0–5.0)
HCT: 38.2 % (ref 36.0–46.0)
Hemoglobin: 11.8 g/dL — ABNORMAL LOW (ref 12.0–15.0)
Lymphocytes Relative: 17.2 % (ref 12.0–46.0)
Lymphs Abs: 2.2 10*3/uL (ref 0.7–4.0)
MCHC: 31 g/dL (ref 30.0–36.0)
MCV: 75.5 fl — ABNORMAL LOW (ref 78.0–100.0)
Monocytes Absolute: 0.7 10*3/uL (ref 0.1–1.0)
Monocytes Relative: 5.7 % (ref 3.0–12.0)
Neutro Abs: 9.7 10*3/uL — ABNORMAL HIGH (ref 1.4–7.7)
Neutrophils Relative %: 74.2 % (ref 43.0–77.0)
Platelets: 394 10*3/uL (ref 150.0–400.0)
RBC: 5.06 Mil/uL (ref 3.87–5.11)
RDW: 16.9 % — ABNORMAL HIGH (ref 11.5–15.5)
WBC: 13 10*3/uL — ABNORMAL HIGH (ref 4.0–10.5)

## 2018-08-18 LAB — URINALYSIS, ROUTINE W REFLEX MICROSCOPIC
Bilirubin Urine: NEGATIVE
Hgb urine dipstick: NEGATIVE
Ketones, ur: NEGATIVE
Leukocytes, UA: NEGATIVE
Nitrite: NEGATIVE
RBC / HPF: NONE SEEN (ref 0–?)
Specific Gravity, Urine: 1.025 (ref 1.000–1.030)
Total Protein, Urine: 100 — AB
Urine Glucose: NEGATIVE
Urobilinogen, UA: 0.2 (ref 0.0–1.0)
pH: 6 (ref 5.0–8.0)

## 2018-08-18 LAB — BASIC METABOLIC PANEL
BUN: 32 mg/dL — ABNORMAL HIGH (ref 6–23)
CO2: 26 mEq/L (ref 19–32)
Calcium: 9.4 mg/dL (ref 8.4–10.5)
Chloride: 107 mEq/L (ref 96–112)
Creatinine, Ser: 1.68 mg/dL — ABNORMAL HIGH (ref 0.40–1.20)
GFR: 38.25 mL/min — ABNORMAL LOW (ref >60.00)
Glucose, Bld: 102 mg/dL — ABNORMAL HIGH (ref 70–99)
Potassium: 3.6 mEq/L (ref 3.5–5.1)
Sodium: 140 mEq/L (ref 135–145)

## 2018-08-18 LAB — HEPATIC FUNCTION PANEL
ALT: 12 U/L (ref 0–35)
AST: 12 U/L (ref 0–37)
Albumin: 4.1 g/dL (ref 3.5–5.2)
Alkaline Phosphatase: 76 U/L (ref 39–117)
Bilirubin, Direct: 0.1 mg/dL (ref 0.0–0.3)
Total Bilirubin: 0.3 mg/dL (ref 0.2–1.2)
Total Protein: 7.4 g/dL (ref 6.0–8.3)

## 2018-08-18 LAB — LIPASE: Lipase: 60 U/L — ABNORMAL HIGH (ref 11.0–59.0)

## 2018-08-18 IMAGING — DX DG ABDOMEN ACUTE W/ 1V CHEST
3 series · 3 of 3 positions shown · non-contrast
Comparison: CT Abdomen and Pelvis [DATE]. Chest radiographs
[DATE]. Chest CT [DATE].

CLINICAL DATA: 72-year-old female with abdominal pain and
distention for 2 weeks.

EXAM:
DG ABDOMEN ACUTE W/ 1V CHEST

[chest pa]
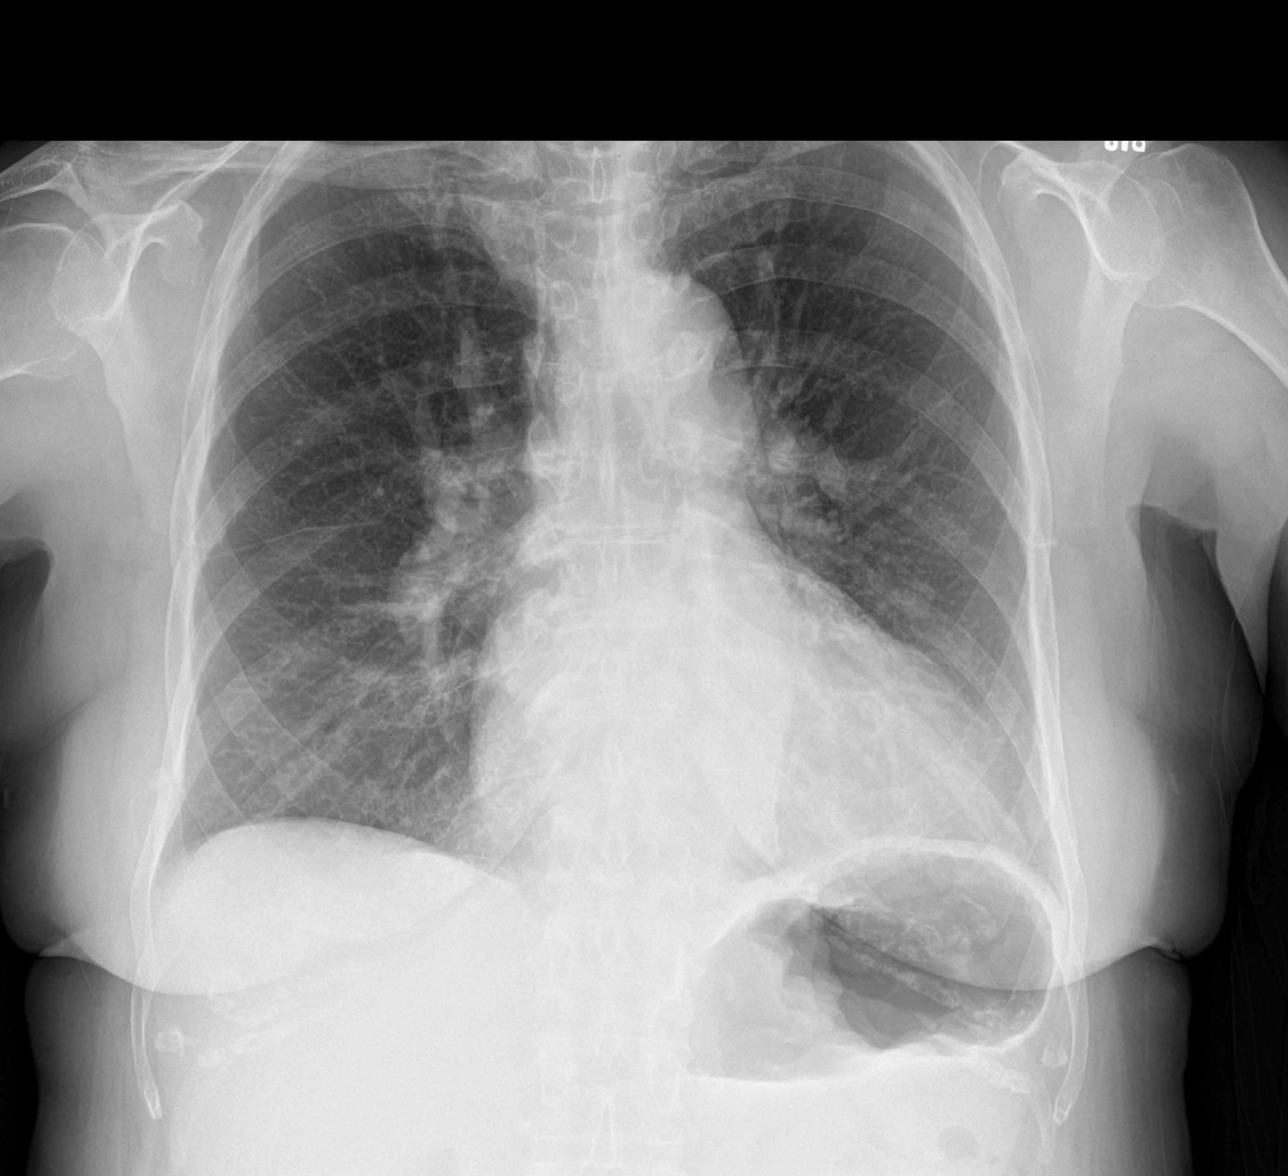

[abdomen erect]
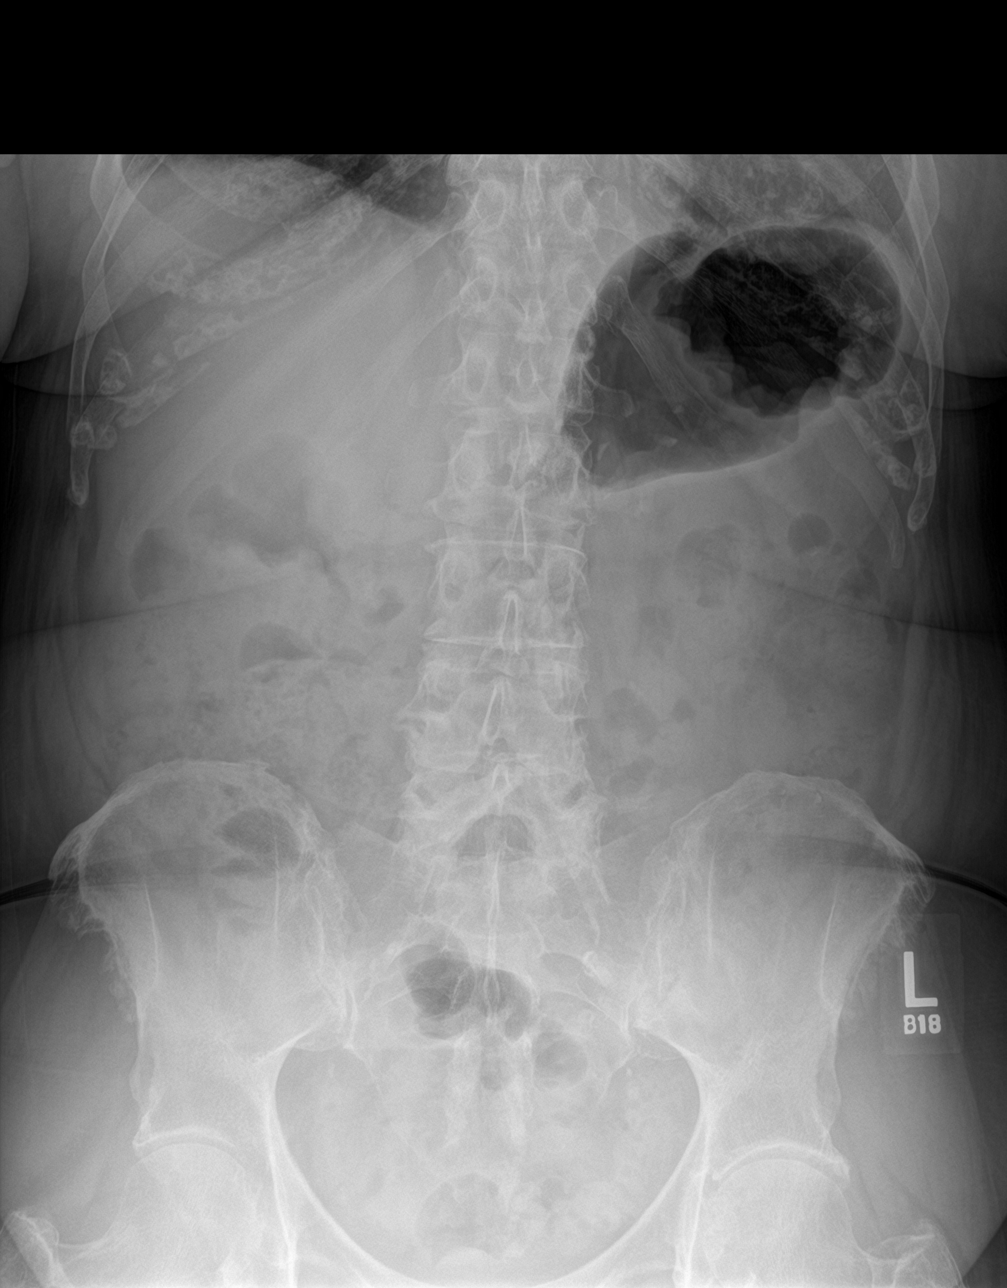

[abdomen supine]
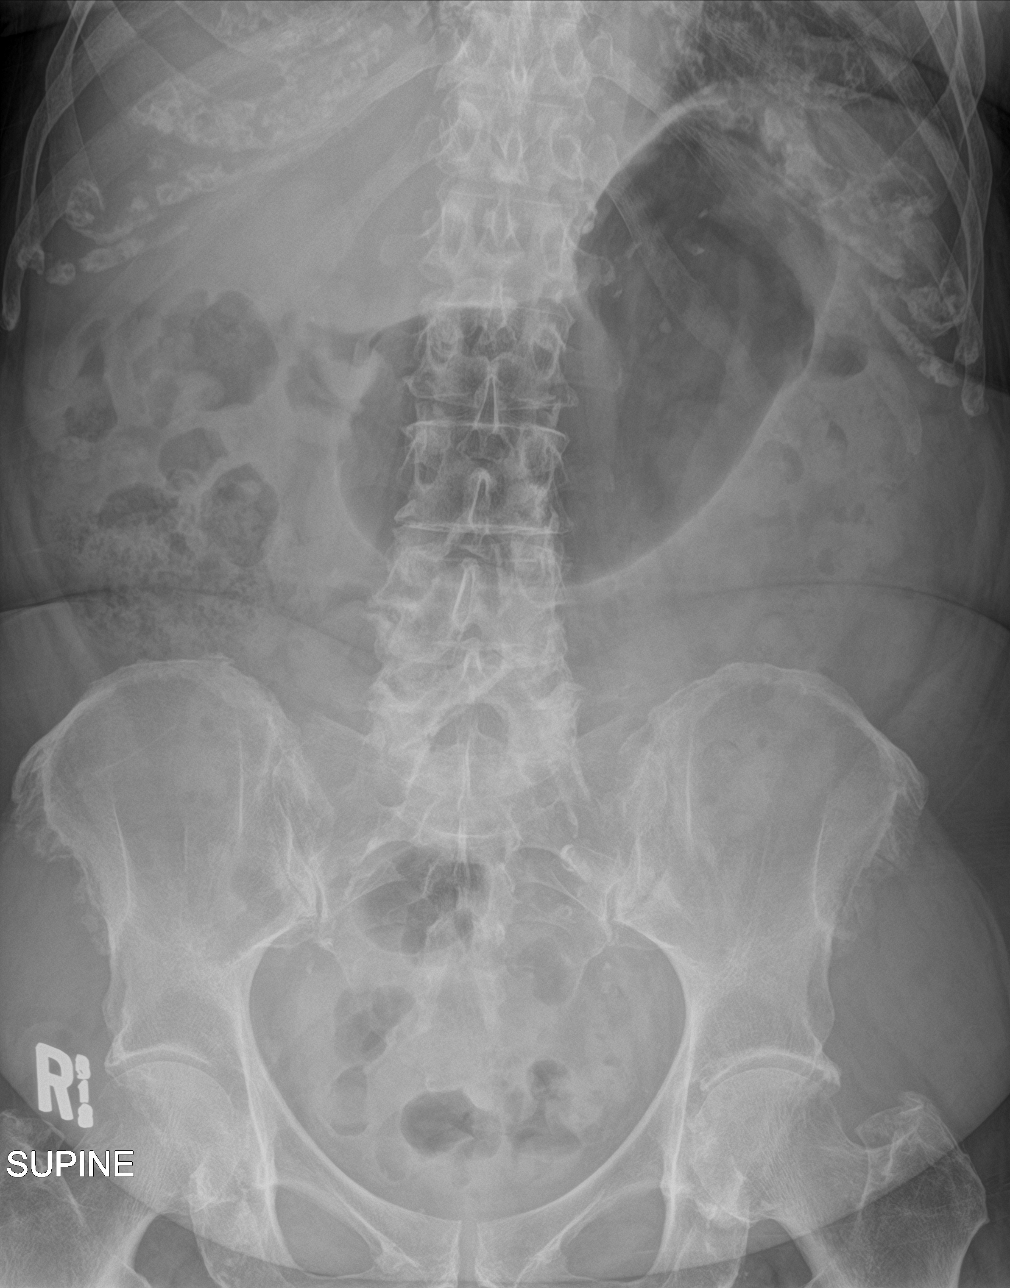

[3 of 3 positions shown; findings below may reference images not displayed]

FINDINGS: *INSERT* stable cardiomegaly confluent opacity at the left lung base
has resolved since [DATE]. No pneumothorax or
pneumoperitoneum. No acute pulmonary opacity.

The stomach is moderately distended with gas, but otherwise the
bowel gas pattern is normal and nonobstructed. Other abdominal and
pelvic visceral contours are normal.

No acute osseous abnormality identified. Aortoiliac calcified
atherosclerosis.
IMPRESSION: 1. The stomach is moderately distended with gas. NG tube
decompression may be valuable.
2. No free air and otherwise normal bowel gas pattern.
3.  No acute cardiopulmonary abnormality.
4. Mild cardiomegaly.  Calcified aortic atherosclerosis.

## 2018-08-18 NOTE — Patient Instructions (Addendum)
Please continue all other medications as before, and refills have been done if requested.  Please have the pharmacy call with any other refills you may need.  Please continue your efforts at being more active, low cholesterol diet, and weight control  Please keep your appointments with your specialists as you may have planned  Please go to the XRAY Department in the Basement (go straight as you get off the elevator) for the x-ray testing  Please go to the LAB in the Basement (turn left off the elevator) for the tests to be done today  You will be contacted by phone if any changes need to be made immediately.  Otherwise, you will receive a letter about your results with an explanation, but please check with MyChart first.  Please remember to sign up for MyChart if you have not done so, as this will be important to you in the future with finding out test results, communicating by private email, and scheduling acute appointments online when needed.  Please return in 6 months, or sooner if needed, with Lab testing done 3-5 days before

## 2018-08-18 NOTE — Progress Notes (Signed)
Subjective:    Patient ID: Elaine Turner, female    DOB: 1946-09-10, 72 y.o.   MRN: [id number]  HPI  Here with abd swelling and bloating with mild mid abd discomfort, achy assoc with belching and gas, but no n/v, fever, worsening pain overall and Denies worsening reflux, dysphagia, or blood, but has ongoing constipation; was improved with samples of Linzess helps but not filled the rx yet so currently not taking in the last week. Does recall less abd discomfort after successful BM.  Pt denies chest pain, increased sob or doe, wheezing, orthopnea, PND, increased LE swelling, palpitations, dizziness or syncope.  Pt denies new neurological symptoms such as new headache, or facial or extremity weakness or numbness   Pt denies polydipsia, polyuria Past Medical History:  Diagnosis Date  . Arthritis   . Chronic diarrhea   . COPD  GOLD 0 / active smoker  10/24/2015  . Heart murmur   . Hemorrhoids   . Hyperglycemia 05/01/2013  . Hypertension   . Pneumonia 05/01/2013  . PVD (peripheral vascular disease) ([place])   . Urine incontinence    Past Surgical History:  Procedure Laterality Date  . ABDOMINAL HYSTERECTOMY    . HEMORRHOID SURGERY N/A 06/26/2015   Procedure: INTERNAL AND EXTERNAL HEMORRHOIDECTOMY;  Surgeon: Georganna Skeans, MD;  Location: [facility];  Service: General;  Laterality: N/A;    reports that she has been smoking cigarettes. She has a 87.00 pack-year smoking history. She has never used smokeless tobacco. She reports that she drinks about 1.0 standard drinks of alcohol per week. She reports that she does not use drugs. family history includes Hypertension in her mother; Lung cancer in her father. Allergies  Allergen Reactions  . Ace Inhibitors Cough  . Levaquin [Levofloxacin] Other (See Comments)    GI upset   Current Outpatient Medications on File Prior to Visit  Medication Sig Dispense Refill  . albuterol (PROVENTIL HFA;VENTOLIN HFA) 108 (90 Base) MCG/ACT inhaler  Inhale 2 puffs into the lungs every 6 (six) hours as needed for wheezing or shortness of breath. 1 Inhaler 2  . aspirin 81 MG EC tablet Take 1 tablet (81 mg total) by mouth daily. Swallow whole. 30 tablet 12  . azelastine (OPTIVAR) 0.05 % ophthalmic solution Place 1 drop into both eyes 2 (two) times daily as needed. 6 mL 12  . cetirizine (ZYRTEC) 10 MG tablet Take 1 tablet (10 mg total) by mouth daily as needed for allergies. 30 tablet 11  . fluticasone (FLONASE) 50 MCG/ACT nasal spray Place 2 sprays into both nostrils daily. 16 g 6  . hydrochlorothiazide (HYDRODIURIL) 25 MG tablet TAKE 1 TABLET (25 MG TOTAL) BY MOUTH DAILY. 90 tablet 1  . linaclotide (LINZESS) 290 MCG CAPS capsule Take 1 capsule (290 mcg total) by mouth daily as needed. 30 capsule 11  . losartan (COZAAR) 50 MG tablet TAKE 1 TABLET (50 MG TOTAL) BY MOUTH DAILY. 90 tablet 1  . NIFEdipine (PROCARDIA XL/ADALAT-CC) 90 MG 24 hr tablet TAKE 1 TABLET (90 MG TOTAL) BY MOUTH DAILY. 90 tablet 3  . predniSONE (DELTASONE) 10 MG tablet 3 tabs by mouth per day for 3 days,2tabs per day for 3 days,1tab per day for 3 days 18 tablet 0   No current facility-administered medications on file prior to visit.    Review of Systems  Constitutional: Negative for other unusual diaphoresis or sweats HENT: Negative for ear discharge or swelling Eyes: Negative for other worsening visual disturbances Respiratory: Negative for  stridor or other swelling  Gastrointestinal: Negative for worsening distension or other blood Genitourinary: Negative for retention or other urinary change Musculoskeletal: Negative for other MSK pain or swelling Skin: Negative for color change or other new lesions Neurological: Negative for worsening tremors and other numbness  Psychiatric/Behavioral: Negative for worsening agitation or other fatigue All other system neg per pt    Objective:   Physical Exam BP (!) 158/68 (BP Location: Left Arm, Patient Position: Sitting, Cuff  Size: Normal)   Pulse 91   Temp 98.3 F (36.8 C) (Oral)   Ht 5\' 2"  (1.575 m)   Wt 144 lb 6.4 oz (65.5 kg)   SpO2 95%   BMI 26.41 kg/m  VS noted,  Constitutional: Pt appears in NAD HENT: Head: NCAT.  Right Ear: External ear normal.  Left Ear: External ear normal.  Eyes: . Pupils are equal, round, and reactive to light. Conjunctivae and EOM are normal Nose: without d/c or deformity Neck: Neck supple. Gross normal ROM Cardiovascular: Normal rate and regular rhythm.   Pulmonary/Chest: Effort normal and breath sounds without rales or wheezing.  Abd:  Soft, NT, + BS, no organomegaly, mild distended but no fluid wave or mass Neurological: Pt is alert. At baseline orientation, motor grossly intact Skin: Skin is warm. No rashes, other new lesions, no LE edema Psychiatric: Pt behavior is normal without agitation  No other exam findings Lab Results  Component Value Date   WBC 13.0 (H) 08/18/2018   HGB 11.8 (L) 08/18/2018   HCT 38.2 08/18/2018   PLT 394.0 08/18/2018   GLUCOSE 102 (H) 08/18/2018   CHOL 175 01/06/2018   TRIG 97.0 01/06/2018   HDL 64.30 01/06/2018   LDLCALC 91 01/06/2018   ALT 12 08/18/2018   AST 12 08/18/2018   NA 140 08/18/2018   K 3.6 08/18/2018   CL 107 08/18/2018   CREATININE 1.68 (H) 08/18/2018   BUN 32 (H) 08/18/2018   CO2 26 08/18/2018   TSH 0.64 01/06/2018   HGBA1C 5.7 01/06/2018         Assessment & Plan:

## 2018-08-18 NOTE — Assessment & Plan Note (Signed)
Mild persistent, ok for linzess if ok with insurance,  to f/u any worsening symptoms or concerns

## 2018-08-18 NOTE — Assessment & Plan Note (Addendum)
Suspect constipation related, cant r/o other - for films today, and labs as ordered

## 2018-08-19 ENCOUNTER — Encounter: Payer: Self-pay | Admitting: Internal Medicine

## 2018-09-16 ENCOUNTER — Ambulatory Visit (INDEPENDENT_AMBULATORY_CARE_PROVIDER_SITE_OTHER): Payer: Medicare Other | Admitting: Internal Medicine

## 2018-09-16 VITALS — BP 134/80 | HR 80 | Temp 98.2°F | Ht 62.0 in | Wt 146.0 lb

## 2018-09-16 DIAGNOSIS — J309 Allergic rhinitis, unspecified: Secondary | ICD-10-CM | POA: Diagnosis not present

## 2018-09-16 DIAGNOSIS — J449 Chronic obstructive pulmonary disease, unspecified: Secondary | ICD-10-CM

## 2018-09-16 DIAGNOSIS — J329 Chronic sinusitis, unspecified: Secondary | ICD-10-CM | POA: Diagnosis not present

## 2018-09-16 MED ORDER — METHYLPREDNISOLONE ACETATE 80 MG/ML IJ SUSP
80.0000 mg | Freq: Once | INTRAMUSCULAR | Status: AC
  Administered 2018-09-16: 80 mg via INTRAMUSCULAR

## 2018-09-16 MED ORDER — AZITHROMYCIN 250 MG PO TABS: ORAL_TABLET | ORAL | 1 refills | Status: DC

## 2018-09-16 MED ORDER — PREDNISONE 10 MG PO TABS: ORAL_TABLET | ORAL | 0 refills | Status: DC

## 2018-09-16 NOTE — Patient Instructions (Signed)
You had the steroid shot today  Please take all new medication as prescribed - the antibiotic, and prednisone  Please continue all other medications as before, and refills have been done if requested.  Please have the pharmacy call with any other refills you may need.  Please keep your appointments with your specialists as you may have planned  You will be contacted regarding the referral for: Allergy

## 2018-09-16 NOTE — Progress Notes (Signed)
Subjective:    Patient ID: Elaine Turner, female    DOB: Jan 14, 1946, 71 y.o.   MRN: [id number]  HPI   Here with 2-3 days acute onset fever, facial pain, pressure, headache, general weakness and malaise, and greenish d/c, with mild ST and cough, but pt denies chest pain, wheezing, increased sob or doe, orthopnea, PND, increased LE swelling, palpitations, dizziness or syncope.  Does have several wks ongoing nasal allergy symptoms with clearish congestion, itch and sneezing, without fever, pain, ST, cough, swelling or wheezing.  Pt denies new neurological symptoms such as new headache, or facial or extremity weakness or numbness   Pt denies polydipsia, polyuria Past Medical History:  Diagnosis Date  . Arthritis   . Chronic diarrhea   . COPD  GOLD 0 / active smoker  10/24/2015  . Heart murmur   . Hemorrhoids   . Hyperglycemia 05/01/2013  . Hypertension   . Pneumonia 05/01/2013  . PVD (peripheral vascular disease) ([employer])   . Urine incontinence    Past Surgical History:  Procedure Laterality Date  . ABDOMINAL HYSTERECTOMY    . HEMORRHOID SURGERY N/A 06/26/2015   Procedure: INTERNAL AND EXTERNAL HEMORRHOIDECTOMY;  Surgeon: Georganna Skeans, MD;  Location: [facility];  Service: General;  Laterality: N/A;    reports that she has been smoking cigarettes. She has a 87.00 pack-year smoking history. She has never used smokeless tobacco. She reports that she drinks about 1.0 standard drinks of alcohol per week. She reports that she does not use drugs. family history includes Hypertension in her mother; Lung cancer in her father. Allergies  Allergen Reactions  . Ace Inhibitors Cough  . Levaquin [Levofloxacin] Other (See Comments)    GI upset   Current Outpatient Medications on File Prior to Visit  Medication Sig Dispense Refill  . albuterol (PROVENTIL HFA;VENTOLIN HFA) 108 (90 Base) MCG/ACT inhaler Inhale 2 puffs into the lungs every 6 (six) hours as needed for wheezing or shortness of  breath. 1 Inhaler 2  . aspirin 81 MG EC tablet Take 1 tablet (81 mg total) by mouth daily. Swallow whole. 30 tablet 12  . cetirizine (ZYRTEC) 10 MG tablet Take 1 tablet (10 mg total) by mouth daily as needed for allergies. 30 tablet 11  . fluticasone (FLONASE) 50 MCG/ACT nasal spray Place 2 sprays into both nostrils daily. 16 g 6  . hydrochlorothiazide (HYDRODIURIL) 25 MG tablet TAKE 1 TABLET (25 MG TOTAL) BY MOUTH DAILY. 90 tablet 1  . linaclotide (LINZESS) 290 MCG CAPS capsule Take 1 capsule (290 mcg total) by mouth daily as needed. 30 capsule 11  . losartan (COZAAR) 50 MG tablet TAKE 1 TABLET (50 MG TOTAL) BY MOUTH DAILY. 90 tablet 1  . NIFEdipine (PROCARDIA XL/ADALAT-CC) 90 MG 24 hr tablet TAKE 1 TABLET (90 MG TOTAL) BY MOUTH DAILY. 90 tablet 3   No current facility-administered medications on file prior to visit.    Review of Systems  Constitutional: Negative for other unusual diaphoresis or sweats HENT: Negative for ear discharge or swelling Eyes: Negative for other worsening visual disturbances Respiratory: Negative for stridor or other swelling  Gastrointestinal: Negative for worsening distension or other blood Genitourinary: Negative for retention or other urinary change Musculoskeletal: Negative for other MSK pain or swelling Skin: Negative for color change or other new lesions Neurological: Negative for worsening tremors and other numbness  Psychiatric/Behavioral: Negative for worsening agitation or other fatigue All other system neg per pt    Objective:   Physical Exam  Assessment & Plan:

## 2018-09-17 NOTE — Assessment & Plan Note (Signed)
stable overall by history and exam, recent data reviewed with pt, and pt to continue medical treatment as before,  to f/u any worsening symptoms or concerns  

## 2018-09-17 NOTE — Progress Notes (Signed)
Subjective:    Patient ID: Elaine Turner, female    DOB: 1946-08-22, 72 y.o.   MRN: [id number]  HPI   Here with 2-3 days acute onset fever, facial pain, pressure, headache, general weakness and malaise, and greenish d/c, with mild ST and cough, but pt denies chest pain, wheezing, increased sob or doe, orthopnea, PND, increased LE swelling, palpitations, dizziness or syncope.  Does have several wks ongoing nasal allergy symptoms with clearish congestion, itch and sneezing, without fever, pain, ST, cough, swelling or wheezing.  Pt denies new neurological symptoms such as new headache, or facial or extremity weakness or numbness   Pt denies polydipsia, polyuria \ Past Medical History:  Diagnosis Date  . Arthritis   . Chronic diarrhea   . COPD  GOLD 0 / active smoker  10/24/2015  . Heart murmur   . Hemorrhoids   . Hyperglycemia 05/01/2013  . Hypertension   . Pneumonia 05/01/2013  . PVD (peripheral vascular disease) ([place])   . Urine incontinence    Past Surgical History:  Procedure Laterality Date  . ABDOMINAL HYSTERECTOMY    . HEMORRHOID SURGERY N/A 06/26/2015   Procedure: INTERNAL AND EXTERNAL HEMORRHOIDECTOMY;  Surgeon: Georganna Skeans, MD;  Location: [facility];  Service: General;  Laterality: N/A;    reports that she has been smoking cigarettes. She has a 87.00 pack-year smoking history. She has never used smokeless tobacco. She reports that she drinks about 1.0 standard drinks of alcohol per week. She reports that she does not use drugs. family history includes Hypertension in her mother; Lung cancer in her father. Allergies  Allergen Reactions  . Ace Inhibitors Cough  . Levaquin [Levofloxacin] Other (See Comments)    GI upset   Current Outpatient Medications on File Prior to Visit  Medication Sig Dispense Refill  . albuterol (PROVENTIL HFA;VENTOLIN HFA) 108 (90 Base) MCG/ACT inhaler Inhale 2 puffs into the lungs every 6 (six) hours as needed for wheezing or shortness  of breath. 1 Inhaler 2  . aspirin 81 MG EC tablet Take 1 tablet (81 mg total) by mouth daily. Swallow whole. 30 tablet 12  . cetirizine (ZYRTEC) 10 MG tablet Take 1 tablet (10 mg total) by mouth daily as needed for allergies. 30 tablet 11  . fluticasone (FLONASE) 50 MCG/ACT nasal spray Place 2 sprays into both nostrils daily. 16 g 6  . hydrochlorothiazide (HYDRODIURIL) 25 MG tablet TAKE 1 TABLET (25 MG TOTAL) BY MOUTH DAILY. 90 tablet 1  . linaclotide (LINZESS) 290 MCG CAPS capsule Take 1 capsule (290 mcg total) by mouth daily as needed. 30 capsule 11  . losartan (COZAAR) 50 MG tablet TAKE 1 TABLET (50 MG TOTAL) BY MOUTH DAILY. 90 tablet 1  . NIFEdipine (PROCARDIA XL/ADALAT-CC) 90 MG 24 hr tablet TAKE 1 TABLET (90 MG TOTAL) BY MOUTH DAILY. 90 tablet 3   No current facility-administered medications on file prior to visit.    Review of Systems  Constitutional: Negative for other unusual diaphoresis or sweats HENT: Negative for ear discharge or swelling Eyes: Negative for other worsening visual disturbances Respiratory: Negative for stridor or other swelling  Gastrointestinal: Negative for worsening distension or other blood Genitourinary: Negative for retention or other urinary change Musculoskeletal: Negative for other MSK pain or swelling Skin: Negative for color change or other new lesions Neurological: Negative for worsening tremors and other numbness  Psychiatric/Behavioral: Negative for worsening agitation or other fatigue All other system neg per pt    Objective:   Physical  Exam BP 134/80   Pulse 80   Temp 98.2 F (36.8 C) (Oral)   Ht 5\' 2"  (1.575 m)   Wt 146 lb (66.2 kg)   SpO2 96%   BMI 26.70 kg/m  VS noted, mild ill Constitutional: Pt appears in NAD HENT: Head: NCAT.  Right Ear: External ear normal.  Left Ear: External ear normal.  Bilat tm's with mild erythema.  Max sinus areas mild tender.  Pharynx with mild erythema, no exudate Eyes: . Pupils are equal, round, and  reactive to light. Conjunctivae and EOM are normal Nose: without d/c or deformity Neck: Neck supple. Gross normal ROM Cardiovascular: Normal rate and regular rhythm.   Pulmonary/Chest: Effort normal and breath sounds without rales or wheezing.  Neurological: Pt is alert. At baseline orientation, motor grossly intact Skin: Skin is warm. No rashes, other new lesions, no LE edema Psychiatric: Pt behavior is normal without agitation  No other exam findings Lab Results  Component Value Date   WBC 13.0 (H) 08/18/2018   HGB 11.8 (L) 08/18/2018   HCT 38.2 08/18/2018   PLT 394.0 08/18/2018   GLUCOSE 102 (H) 08/18/2018   CHOL 175 01/06/2018   TRIG 97.0 01/06/2018   HDL 64.30 01/06/2018   LDLCALC 91 01/06/2018   ALT 12 08/18/2018   AST 12 08/18/2018   NA 140 08/18/2018   K 3.6 08/18/2018   CL 107 08/18/2018   CREATININE 1.68 (H) 08/18/2018   BUN 32 (H) 08/18/2018   CO2 26 08/18/2018   TSH 0.64 01/06/2018   HGBA1C 5.7 01/06/2018       Assessment & Plan:

## 2018-09-17 NOTE — Assessment & Plan Note (Addendum)
Mild to mod, for depomedrol IM 80, predpac course,  to f/u any worsening symptoms or concerns, also referral to allergy per pt request

## 2018-09-17 NOTE — Assessment & Plan Note (Signed)
Mild to mod, for antibx course,  to f/u any worsening symptoms or concerns 

## 2018-09-26 ENCOUNTER — Other Ambulatory Visit: Payer: Self-pay | Admitting: *Deleted

## 2018-09-26 ENCOUNTER — Telehealth: Payer: Self-pay | Admitting: Internal Medicine

## 2018-09-26 MED ORDER — LOSARTAN POTASSIUM 50 MG PO TABS: 50.0000 mg | ORAL_TABLET | Freq: Every day | ORAL | 1 refills | Status: DC

## 2018-09-26 NOTE — Telephone Encounter (Signed)
Rx refilled per protocol- LOV: 07/28/18. Due next visit- 01/28/19

## 2018-09-26 NOTE — Telephone Encounter (Signed)
Copied from [place] [phone]. Topic: Quick Communication - Rx Refill/Question >> Sep 26, 2018  2:57 PM Blase Mess A wrote: Medication: losartan (COZAAR) 50 MG tablet [[id number]]   Has the patient contacted their pharmacy? Yes  (Agent: If no, request that the patient contact the pharmacy for the refill.) (Agent: If yes, when and what did the pharmacy advise?)  Preferred Pharmacy (with phone number or street name):[id number] - [place], [place]  Agent: Please be advised that RX refills may take up to 3 business days. We ask that you follow-up with your pharmacy.

## 2018-09-28 ENCOUNTER — Other Ambulatory Visit: Payer: Self-pay | Admitting: Internal Medicine

## 2018-11-11 ENCOUNTER — Ambulatory Visit (INDEPENDENT_AMBULATORY_CARE_PROVIDER_SITE_OTHER): Payer: Medicare Other | Admitting: Internal Medicine

## 2018-11-11 ENCOUNTER — Encounter: Payer: Self-pay | Admitting: Internal Medicine

## 2018-11-11 VITALS — BP 126/78 | HR 70 | Temp 98.2°F | Ht 62.0 in | Wt 150.0 lb

## 2018-11-11 DIAGNOSIS — K5900 Constipation, unspecified: Secondary | ICD-10-CM | POA: Diagnosis not present

## 2018-11-11 DIAGNOSIS — E785 Hyperlipidemia, unspecified: Secondary | ICD-10-CM | POA: Diagnosis not present

## 2018-11-11 DIAGNOSIS — Z23 Encounter for immunization: Secondary | ICD-10-CM

## 2018-11-11 DIAGNOSIS — I10 Essential (primary) hypertension: Secondary | ICD-10-CM

## 2018-11-11 DIAGNOSIS — R739 Hyperglycemia, unspecified: Secondary | ICD-10-CM | POA: Diagnosis not present

## 2018-11-11 MED ORDER — LACTULOSE 10 GM/15ML PO SOLN: 30.0000 g | Freq: Two times a day (BID) | ORAL | 5 refills | Status: DC | PRN

## 2018-11-11 NOTE — Progress Notes (Signed)
Subjective:    Patient ID: Elaine Turner, female    DOB: 1946/04/10, 72 y.o.   MRN: [id number]  HPI  Here to f/u with mention of increased 2 wks Gas and bloating with mild intermittent discomfort, gets full so fast has lesser po intake, also flatulence instead of BM's.  Hurts internally it seems but non tender to touch. Denies worsening reflux, dysphagia, n/v or blood.  Appetite some what lower but ? Gained wt -  Wt Readings from Last 3 Encounters:  11/11/18 150 lb (68 kg)  09/16/18 146 lb (66.2 kg)  08/18/18 144 lb 6.4 oz (65.5 kg)  Has ongoing chronic constipation, only some improved with linzess .Pt denies chest pain, increased sob or doe, wheezing, orthopnea, PND, increased LE swelling, palpitations, dizziness or syncope.  Pt denies new neurological symptoms such as new headache, or facial or extremity weakness or numbness   Pt denies polydipsia, polyuria Past Medical History:  Diagnosis Date  . Arthritis   . Chronic diarrhea   . COPD  GOLD 0 / active smoker  10/24/2015  . Heart murmur   . Hemorrhoids   . Hyperglycemia 05/01/2013  . Hypertension   . Pneumonia 05/01/2013  . PVD (peripheral vascular disease) ([place])   . Urine incontinence    Past Surgical History:  Procedure Laterality Date  . ABDOMINAL HYSTERECTOMY    . HEMORRHOID SURGERY N/A 06/26/2015   Procedure: INTERNAL AND EXTERNAL HEMORRHOIDECTOMY;  Surgeon: Georganna Skeans, MD;  Location: [facility];  Service: General;  Laterality: N/A;    reports that she has been smoking cigarettes. She has a 87.00 pack-year smoking history. She has never used smokeless tobacco. She reports that she drinks about 1.0 standard drinks of alcohol per week. She reports that she does not use drugs. family history includes Hypertension in her mother; Lung cancer in her father. Allergies  Allergen Reactions  . Ace Inhibitors Cough  . Levaquin [Levofloxacin] Other (See Comments)    GI upset   Current Outpatient Medications on File  Prior to Visit  Medication Sig Dispense Refill  . albuterol (PROVENTIL HFA;VENTOLIN HFA) 108 (90 Base) MCG/ACT inhaler Inhale 2 puffs into the lungs every 6 (six) hours as needed for wheezing or shortness of breath. 1 Inhaler 2  . aspirin 81 MG EC tablet Take 1 tablet (81 mg total) by mouth daily. Swallow whole. 30 tablet 12  . azithromycin (ZITHROMAX Z-PAK) 250 MG tablet 2 tab by mouth day 1, then 1 per day 6 tablet 1  . cetirizine (ZYRTEC) 10 MG tablet Take 1 tablet (10 mg total) by mouth daily as needed for allergies. 30 tablet 11  . fluticasone (FLONASE) 50 MCG/ACT nasal spray Place 2 sprays into both nostrils daily. 16 g 6  . hydrochlorothiazide (HYDRODIURIL) 25 MG tablet TAKE 1 TABLET (25 MG TOTAL) BY MOUTH DAILY. 90 tablet 1  . linaclotide (LINZESS) 290 MCG CAPS capsule Take 1 capsule (290 mcg total) by mouth daily as needed. 30 capsule 11  . losartan (COZAAR) 25 MG tablet TAKE 1 TABLET BY MOUTH TWICE A DAY 180 tablet 0  . NIFEdipine (PROCARDIA XL/ADALAT-CC) 90 MG 24 hr tablet TAKE 1 TABLET (90 MG TOTAL) BY MOUTH DAILY. 90 tablet 3  . predniSONE (DELTASONE) 10 MG tablet 3 tabs by mouth per day for 3 days,2tabs per day for 3 days,1tab per day for 3 days 18 tablet 0   No current facility-administered medications on file prior to visit.    Review of Systems  Constitutional: Negative for other unusual diaphoresis or sweats HENT: Negative for ear discharge or swelling Eyes: Negative for other worsening visual disturbances Respiratory: Negative for stridor or other swelling  Gastrointestinal: Negative for worsening distension or other blood Genitourinary: Negative for retention or other urinary change Musculoskeletal: Negative for other MSK pain or swelling Skin: Negative for color change or other new lesions Neurological: Negative for worsening tremors and other numbness  Psychiatric/Behavioral: Negative for worsening agitation or other fatigue All other system neg per pt    Objective:     Physical Exam BP 126/78   Pulse 70   Temp 98.2 F (36.8 C) (Oral)   Ht 5\' 2"  (1.575 m)   Wt 150 lb (68 kg)   SpO2 94%   BMI 27.44 kg/m  VS noted,  Constitutional: Pt appears in NAD HENT: Head: NCAT.  Right Ear: External ear normal.  Left Ear: External ear normal.  Eyes: . Pupils are equal, round, and reactive to light. Conjunctivae and EOM are normal Nose: without d/c or deformity Neck: Neck supple. Gross normal ROM Cardiovascular: Normal rate and regular rhythm.   Pulmonary/Chest: Effort normal and breath sounds without rales or wheezing.  Abd:  Soft, NT, ND, + BS, no organomegaly Neurological: Pt is alert. At baseline orientation, motor grossly intact Skin: Skin is warm. No rashes, other new lesions, no LE edema Psychiatric: Pt behavior is normal without agitation  No other exam findings Lab Results  Component Value Date   WBC 13.0 (H) 08/18/2018   HGB 11.8 (L) 08/18/2018   HCT 38.2 08/18/2018   PLT 394.0 08/18/2018   GLUCOSE 102 (H) 08/18/2018   CHOL 175 01/06/2018   TRIG 97.0 01/06/2018   HDL 64.30 01/06/2018   LDLCALC 91 01/06/2018   ALT 12 08/18/2018   AST 12 08/18/2018   NA 140 08/18/2018   K 3.6 08/18/2018   CL 107 08/18/2018   CREATININE 1.68 (H) 08/18/2018   BUN 32 (H) 08/18/2018   CO2 26 08/18/2018   TSH 0.64 01/06/2018   HGBA1C 5.7 01/06/2018       Assessment & Plan:

## 2018-11-11 NOTE — Assessment & Plan Note (Signed)
stable overall by history and exam, recent data reviewed with pt, and pt to continue medical treatment as before,  to f/u any worsening symptoms or concerns  

## 2018-11-11 NOTE — Assessment & Plan Note (Signed)
Chronic persistent, ok for lactulose prn,  to f/u any worsening symptoms or concerns

## 2018-11-11 NOTE — Patient Instructions (Addendum)
Please take all new medication as prescribed  - the lactulose for constipation  Please continue all other medications as before, and refills have been done if requested.  Please have the pharmacy call with any other refills you may need.  Please continue your efforts at being more active, low cholesterol diet, and weight control.  You are otherwise up to date with prevention measures today.  Please keep your appointments with your specialists as you may have planned  - kidney doctors  No further lab work needed today  Please return in 6 months, or sooner if needed, with Lab testing done 3-5 days before

## 2018-11-22 ENCOUNTER — Encounter: Payer: Self-pay | Admitting: Internal Medicine

## 2018-11-22 ENCOUNTER — Ambulatory Visit (INDEPENDENT_AMBULATORY_CARE_PROVIDER_SITE_OTHER): Payer: Medicare Other | Admitting: Internal Medicine

## 2018-11-22 VITALS — BP 138/86 | HR 74 | Temp 97.9°F | Ht 62.0 in | Wt 151.0 lb

## 2018-11-22 DIAGNOSIS — J449 Chronic obstructive pulmonary disease, unspecified: Secondary | ICD-10-CM | POA: Diagnosis not present

## 2018-11-22 DIAGNOSIS — I10 Essential (primary) hypertension: Secondary | ICD-10-CM

## 2018-11-22 DIAGNOSIS — R739 Hyperglycemia, unspecified: Secondary | ICD-10-CM

## 2018-11-22 DIAGNOSIS — G4700 Insomnia, unspecified: Secondary | ICD-10-CM

## 2018-11-22 MED ORDER — ZOLPIDEM TARTRATE 5 MG PO TABS: 5.0000 mg | ORAL_TABLET | Freq: Every evening | ORAL | 5 refills | Status: DC | PRN

## 2018-11-22 MED ORDER — AZITHROMYCIN 250 MG PO TABS: ORAL_TABLET | ORAL | 1 refills | Status: DC

## 2018-11-22 NOTE — Progress Notes (Signed)
Subjective:    Patient ID: Elaine Turner, female    DOB: 1946-05-13, 72 y.o.   MRN: [id number]  HPI   Here with 2-3 days acute onset fever, facial pain, pressure, headache, general weakness and malaise, and greenish d/c, with mild ST and cough, but pt denies chest pain, wheezing, increased sob or doe, orthopnea, PND, increased LE swelling, palpitations, dizziness or syncope.  Lives alone, sleeping more during the day lately as she has not been able to sleep with betting to bed for several wks.  Pt denies new neurological symptoms such as new headache, or facial or extremity weakness or numbness   Pt denies polydipsia, polyuria Past Medical History:  Diagnosis Date  . Arthritis   . Chronic diarrhea   . COPD  GOLD 0 / active smoker  10/24/2015  . Heart murmur   . Hemorrhoids   . Hyperglycemia 05/01/2013  . Hypertension   . Pneumonia 05/01/2013  . PVD (peripheral vascular disease) ([place])   . Urine incontinence    Past Surgical History:  Procedure Laterality Date  . ABDOMINAL HYSTERECTOMY    . HEMORRHOID SURGERY N/A 06/26/2015   Procedure: INTERNAL AND EXTERNAL HEMORRHOIDECTOMY;  Surgeon: Georganna Skeans, MD;  Location: [facility];  Service: General;  Laterality: N/A;    reports that she has been smoking cigarettes. She has a 87.00 pack-year smoking history. She has never used smokeless tobacco. She reports that she drinks about 1.0 standard drinks of alcohol per week. She reports that she does not use drugs. family history includes Hypertension in her mother; Lung cancer in her father. Allergies  Allergen Reactions  . Ace Inhibitors Cough  . Levaquin [Levofloxacin] Other (See Comments)    GI upset   Current Outpatient Medications on File Prior to Visit  Medication Sig Dispense Refill  . albuterol (PROVENTIL HFA;VENTOLIN HFA) 108 (90 Base) MCG/ACT inhaler Inhale 2 puffs into the lungs every 6 (six) hours as needed for wheezing or shortness of breath. 1 Inhaler 2  . aspirin  81 MG EC tablet Take 1 tablet (81 mg total) by mouth daily. Swallow whole. 30 tablet 12  . cetirizine (ZYRTEC) 10 MG tablet Take 1 tablet (10 mg total) by mouth daily as needed for allergies. 30 tablet 11  . fluticasone (FLONASE) 50 MCG/ACT nasal spray Place 2 sprays into both nostrils daily. 16 g 6  . hydrochlorothiazide (HYDRODIURIL) 25 MG tablet TAKE 1 TABLET (25 MG TOTAL) BY MOUTH DAILY. 90 tablet 1  . lactulose (CHRONULAC) 10 GM/15ML solution Take 45 mLs (30 g total) by mouth 2 (two) times daily as needed for mild constipation. 473 mL 5  . linaclotide (LINZESS) 290 MCG CAPS capsule Take 1 capsule (290 mcg total) by mouth daily as needed. 30 capsule 11  . losartan (COZAAR) 25 MG tablet TAKE 1 TABLET BY MOUTH TWICE A DAY 180 tablet 0  . NIFEdipine (PROCARDIA XL/ADALAT-CC) 90 MG 24 hr tablet TAKE 1 TABLET (90 MG TOTAL) BY MOUTH DAILY. 90 tablet 3  . predniSONE (DELTASONE) 10 MG tablet 3 tabs by mouth per day for 3 days,2tabs per day for 3 days,1tab per day for 3 days 18 tablet 0   No current facility-administered medications on file prior to visit.    Review of Systems  Constitutional: Negative for other unusual diaphoresis or sweats HENT: Negative for ear discharge or swelling Eyes: Negative for other worsening visual disturbances Respiratory: Negative for stridor or other swelling  Gastrointestinal: Negative for worsening distension or other blood  Genitourinary: Negative for retention or other urinary change Musculoskeletal: Negative for other MSK pain or swelling Skin: Negative for color change or other new lesions Neurological: Negative for worsening tremors and other numbness  Psychiatric/Behavioral: Negative for worsening agitation or other fatigue All other system neg per pt    Objective:   Physical Exam BP 138/86   Pulse 74   Temp 97.9 F (36.6 C) (Oral)   Ht 5\' 2"  (1.575 m)   Wt 151 lb (68.5 kg)   SpO2 94%   BMI 27.62 kg/m  VS noted, mild ill Constitutional: Pt appears  in NAD HENT: Head: NCAT.  Right Ear: External ear normal.  Left Ear: External ear normal.  Eyes: . Pupils are equal, round, and reactive to light. Conjunctivae and EOM are normal Bilat tm's with mild erythema.  Max sinus areas mild tender.  Pharynx with mild erythema, no exudate   Nose: without d/c or deformity Neck: Neck supple. Gross normal ROM Cardiovascular: Normal rate and regular rhythm.   Pulmonary/Chest: Effort normal and breath sounds without rales or wheezing.  Abd:  Soft, NT, ND, + BS, no organomegaly Neurological: Pt is alert. At baseline orientation, motor grossly intact Skin: Skin is warm. No rashes, other new lesions, no LE edema Psychiatric: Pt behavior is normal without agitation  No other exam findings Lab Results  Component Value Date   WBC 13.0 (H) 08/18/2018   HGB 11.8 (L) 08/18/2018   HCT 38.2 08/18/2018   PLT 394.0 08/18/2018   GLUCOSE 102 (H) 08/18/2018   CHOL 175 01/06/2018   TRIG 97.0 01/06/2018   HDL 64.30 01/06/2018   LDLCALC 91 01/06/2018   ALT 12 08/18/2018   AST 12 08/18/2018   NA 140 08/18/2018   K 3.6 08/18/2018   CL 107 08/18/2018   CREATININE 1.68 (H) 08/18/2018   BUN 32 (H) 08/18/2018   CO2 26 08/18/2018   TSH 0.64 01/06/2018   HGBA1C 5.7 01/06/2018      Assessment & Plan:

## 2018-11-22 NOTE — Patient Instructions (Signed)
Please take all new medication as prescribed - the antibiotic, and ambien for sleep  Please continue all other medications as before, and refills have been done if requested.  Please have the pharmacy call with any other refills you may need.  Please continue your efforts at being more active, low cholesterol diet, and weight control.  Please keep your appointments with your specialists as you may have planned  Please return in April 2020, or sooner if needed, with Lab testing done 3-5 days before

## 2018-11-25 NOTE — Assessment & Plan Note (Signed)
stable overall by history and exam, recent data reviewed with pt, and pt to continue medical treatment as before,  to f/u any worsening symptoms or concerns  

## 2018-11-25 NOTE — Assessment & Plan Note (Signed)
Mild to mod, for ambien qhs prn trial,  to f/u any worsening symptoms or concerns

## 2018-12-26 ENCOUNTER — Ambulatory Visit: Payer: Self-pay

## 2018-12-26 NOTE — Telephone Encounter (Signed)
Pt c/o intermittent abdominal pian located around her navel. Pt stated she was feeling good today and having no pain. When she does have pain, she stated that the pain is mild. Pt stated she has had intermittent pain x 1 week. Pt thinks it is gas pains. Care advice given and pt verbalized understanding. Appt given for 12/29/17 with Dr Sharlet Salina.  Reason for Disposition . Age > 60 years  Answer Assessment - Initial Assessment Questions 1. LOCATION: "Where does it hurt?"      Around navel 2. RADIATION: "Does the pain shoot anywhere else?" (e.g., chest, back)     no 3. ONSET: "When did the pain begin?" (e.g., minutes, hours or days ago)      Last week  4. SUDDEN: "Gradual or sudden onset?"     gradually 5. PATTERN "Does the pain come and go, or is it constant?"    - If constant: "Is it getting better, staying the same, or worsening?"      (Note: Constant means the pain never goes away completely; most serious pain is constant and it progresses)     - If intermittent: "How long does it last?" "Do you have pain now?"     (Note: Intermittent means the pain goes away completely between bouts)     Comes and goes-25 minutes-no pain now 6. SEVERITY: "How bad is the pain?"  (e.g., Scale 1-10; mild, moderate, or severe)   - MILD (1-3): doesn't interfere with normal activities, abdomen soft and not tender to touch    - MODERATE (4-7): interferes with normal activities or awakens from sleep, tender to touch    - SEVERE (8-10): excruciating pain, doubled over, unable to do any normal activities      Not having pain now--mild 7. RECURRENT SYMPTOM: "Have you ever had this type of abdominal pain before?" If so, ask: "When was the last time?" and "What happened that time?"      no 8. CAUSE: "What do you think is causing the abdominal pain?"     gas 9. RELIEVING/AGGRAVATING FACTORS: "What makes it better or worse?" (e.g., movement, antacids, bowel movement)     Pepto bismol helps  Nothing makes it  worse 10. OTHER SYMPTOMS: "Has there been any vomiting, diarrhea, constipation, or urine problems?"     Chronic constipation (last BM yesterday) 11. PREGNANCY: "Is there any chance you are pregnant?" "When was your last menstrual period?"       n/a  Protocols used: ABDOMINAL PAIN - [facility]

## 2018-12-29 ENCOUNTER — Ambulatory Visit: Payer: Medicare Other | Admitting: Internal Medicine

## 2019-01-06 ENCOUNTER — Ambulatory Visit (INDEPENDENT_AMBULATORY_CARE_PROVIDER_SITE_OTHER): Payer: Medicare Other | Admitting: Internal Medicine

## 2019-01-06 ENCOUNTER — Encounter: Payer: Self-pay | Admitting: Internal Medicine

## 2019-01-06 VITALS — BP 134/82 | HR 78 | Temp 98.4°F | Ht 62.0 in | Wt 152.0 lb

## 2019-01-06 DIAGNOSIS — R1030 Lower abdominal pain, unspecified: Secondary | ICD-10-CM

## 2019-01-06 DIAGNOSIS — I10 Essential (primary) hypertension: Secondary | ICD-10-CM

## 2019-01-06 DIAGNOSIS — R739 Hyperglycemia, unspecified: Secondary | ICD-10-CM

## 2019-01-06 DIAGNOSIS — Z1159 Encounter for screening for other viral diseases: Secondary | ICD-10-CM

## 2019-01-06 DIAGNOSIS — E2839 Other primary ovarian failure: Secondary | ICD-10-CM | POA: Diagnosis not present

## 2019-01-06 DIAGNOSIS — J3489 Other specified disorders of nose and nasal sinuses: Secondary | ICD-10-CM | POA: Diagnosis not present

## 2019-01-06 DIAGNOSIS — Z0001 Encounter for general adult medical examination with abnormal findings: Secondary | ICD-10-CM | POA: Diagnosis not present

## 2019-01-06 DIAGNOSIS — N182 Chronic kidney disease, stage 2 (mild): Secondary | ICD-10-CM | POA: Diagnosis not present

## 2019-01-06 NOTE — Assessment & Plan Note (Signed)
stable overall by history and exam, recent data reviewed with pt, and pt to continue medical treatment as before,  to f/u any worsening symptoms or concerns, for a1c with labs 

## 2019-01-06 NOTE — Assessment & Plan Note (Signed)
stable overall by history and exam, recent data reviewed with pt, and pt to continue medical treatment as before,  to f/u any worsening symptoms or concerns, for f/u with labs

## 2019-01-06 NOTE — Assessment & Plan Note (Addendum)
Etiology unclear, exam benign, for labs including lipase today with further evaluation pending labs or symptoms  In addition to the time spent performing CPE, I spent an additional 25 minutes face to face,in which greater than 50% of this time was spent in counseling and coordination of care for patient's acute illness as documented, including the differential dx, treatment, further evaluation and other management of abd pain, CKD, nasal obstruction, hyperglycemia, and HTN

## 2019-01-06 NOTE — Assessment & Plan Note (Signed)

## 2019-01-06 NOTE — Progress Notes (Signed)
Subjective:    Patient ID: Elaine Turner, female    DOB: 1946-09-07, 73 y.o.   MRN: [id number]  HPI  Here for wellness and f/u;  Overall doing ok;  Pt denies Chest pain, worsening SOB, DOE, wheezing, orthopnea, PND, worsening LE edema, palpitations, dizziness or syncope.  Pt denies neurological change such as new headache, facial or extremity weakness.  Pt denies polydipsia, polyuria, or low sugar symptoms. Pt states overall good compliance with treatment and medications, good tolerability, and has been trying to follow appropriate diet.  Pt denies worsening depressive symptoms, suicidal ideation or panic. No fever, night sweats, wt loss, loss of appetite, or other constitutional symptoms.  Pt states good ability with ADL's, has low fall risk, home safety reviewed and adequate, no other significant changes in hearing or vision, and only occasionally active with exercise. Also, c/o Abd pain x 2 wks, intermittent, sharp, mild to mod, stabbing like sometimes, has some early satiety and lower appetite; no fever but has felt cold sometimes; no n/v, and still has to take the lactulose for a successful BM, Denies worsening reflux, dysphagia, worsening bowel change or blood.   Denies urinary symptoms such as dysuria, frequency, urgency, flank pain, hematuria or n/v, fever, chills. Also c/o > 1 month onset persistent bilat nasal obstruction despite current allergy meds, just cant move air through either side of the nose, but no fever, pain, swelling or d/c Past Medical History:  Diagnosis Date  . Arthritis   . Chronic diarrhea   . COPD  GOLD 0 / active smoker  10/24/2015  . Heart murmur   . Hemorrhoids   . Hyperglycemia 05/01/2013  . Hypertension   . Pneumonia 05/01/2013  . PVD (peripheral vascular disease) ([place])   . Urine incontinence    Past Surgical History:  Procedure Laterality Date  . ABDOMINAL HYSTERECTOMY    . HEMORRHOID SURGERY N/A 06/26/2015   Procedure: INTERNAL AND EXTERNAL  HEMORRHOIDECTOMY;  Surgeon: Georganna Skeans, MD;  Location: [facility];  Service: General;  Laterality: N/A;    reports that she has been smoking cigarettes. She has a 87.00 pack-year smoking history. She has never used smokeless tobacco. She reports current alcohol use of about 1.0 standard drinks of alcohol per week. She reports that she does not use drugs. family history includes Hypertension in her mother; Lung cancer in her father. Allergies  Allergen Reactions  . Ace Inhibitors Cough  . Levaquin [Levofloxacin] Other (See Comments)    GI upset   Current Outpatient Medications on File Prior to Visit  Medication Sig Dispense Refill  . aspirin 81 MG EC tablet Take 1 tablet (81 mg total) by mouth daily. Swallow whole. 30 tablet 12  . cetirizine (ZYRTEC) 10 MG tablet Take 1 tablet (10 mg total) by mouth daily as needed for allergies. 30 tablet 11  . fluticasone (FLONASE) 50 MCG/ACT nasal spray Place 2 sprays into both nostrils daily. 16 g 6  . hydrochlorothiazide (HYDRODIURIL) 25 MG tablet TAKE 1 TABLET (25 MG TOTAL) BY MOUTH DAILY. 90 tablet 1  . lactulose (CHRONULAC) 10 GM/15ML solution Take 45 mLs (30 g total) by mouth 2 (two) times daily as needed for mild constipation. 473 mL 5  . losartan (COZAAR) 25 MG tablet TAKE 1 TABLET BY MOUTH TWICE A DAY 180 tablet 0  . NIFEdipine (PROCARDIA XL/ADALAT-CC) 90 MG 24 hr tablet TAKE 1 TABLET (90 MG TOTAL) BY MOUTH DAILY. 90 tablet 3  . predniSONE (DELTASONE) 10 MG tablet 3  tabs by mouth per day for 3 days,2tabs per day for 3 days,1tab per day for 3 days 18 tablet 0  . zolpidem (AMBIEN) 5 MG tablet Take 1 tablet (5 mg total) by mouth at bedtime as needed for sleep. 30 tablet 5   No current facility-administered medications on file prior to visit.    Review of Systems Constitutional: Negative for other unusual diaphoresis, sweats, appetite or weight changes HENT: Negative for other worsening hearing loss, ear pain, facial swelling,  mouth sores or neck stiffness.   Eyes: Negative for other worsening pain, redness or other visual disturbance.  Respiratory: Negative for other stridor or swelling Cardiovascular: Negative for other palpitations or other chest pain  Gastrointestinal: Negative for worsening diarrhea or loose stools, blood in stool, distention or other pain Genitourinary: Negative for hematuria, flank pain or other change in urine volume.  Musculoskeletal: Negative for myalgias or other joint swelling.  Skin: Negative for other color change, or other wound or worsening drainage.  Neurological: Negative for other syncope or numbness. Hematological: Negative for other adenopathy or swelling Psychiatric/Behavioral: Negative for hallucinations, other worsening agitation, SI, self-injury, or new decreased concentration All other system neg per pt    Objective:   Physical Exam BP 134/82   Pulse 78   Temp 98.4 F (36.9 C) (Oral)   Ht 5\' 2"  (1.575 m)   Wt 152 lb (68.9 kg)   SpO2 92%   BMI 27.80 kg/m  VS noted,  Constitutional: Pt is oriented to person, place, and time. Appears well-developed and well-nourished, in no significant distress and comfortable Head: Normocephalic and atraumatic  Eyes: Conjunctivae and EOM are normal. Pupils are equal, round, and reactive to light Right Ear: External ear normal without discharge Left Ear: External ear normal without discharge Nose: Nose without discharge or deformity Mouth/Throat: Oropharynx is without other ulcerations and moist  Neck: Normal range of motion. Neck supple. No JVD present. No tracheal deviation present or significant neck LA or mass Cardiovascular: Normal rate, regular rhythm, normal heart sounds and intact distal pulses.   Pulmonary/Chest: WOB normal and breath sounds without rales or wheezing  Abdominal: Soft. Bowel sounds are normal. NT. No HSM , no flank tender - benign Musculoskeletal: Normal range of motion. Exhibits no edema Lymphadenopathy:  Has no other cervical adenopathy.  Neurological: Pt is alert and oriented to person, place, and time. Pt has normal reflexes. No cranial nerve deficit. Motor grossly intact, Gait intact Skin: Skin is warm and dry. No rash noted or new ulcerations Psychiatric:  Has normal mood and affect. Behavior is normal without agitation No other exam findings Lab Results  Component Value Date   WBC 13.0 (H) 08/18/2018   HGB 11.8 (L) 08/18/2018   HCT 38.2 08/18/2018   PLT 394.0 08/18/2018   GLUCOSE 102 (H) 08/18/2018   CHOL 175 01/06/2018   TRIG 97.0 01/06/2018   HDL 64.30 01/06/2018   LDLCALC 91 01/06/2018   ALT 12 08/18/2018   AST 12 08/18/2018   NA 140 08/18/2018   K 3.6 08/18/2018   CL 107 08/18/2018   CREATININE 1.68 (H) 08/18/2018   BUN 32 (H) 08/18/2018   CO2 26 08/18/2018   TSH 0.64 01/06/2018   HGBA1C 5.7 01/06/2018      Assessment & Plan:

## 2019-01-06 NOTE — Assessment & Plan Note (Signed)
Etiology unclear, exam benign but cant r/o other, for ENT referral

## 2019-01-06 NOTE — Assessment & Plan Note (Signed)
stable overall by history and exam, recent data reviewed with pt, and pt to continue medical treatment as before,  to f/u any worsening symptoms or concerns  

## 2019-01-06 NOTE — Patient Instructions (Signed)
Please schedule the bone density test before leaving today at the scheduling desk (where you check out)  You will be contacted regarding the referral for: ENT fo rthe nose  Please continue all other medications as before, and refills have been done if requested.  Please have the pharmacy call with any other refills you may need.  Please continue your efforts at being more active, low cholesterol diet, and weight control.  You are otherwise up to date with prevention measures today.  Please keep your appointments with your specialists as you may have planned  Please go to the LAB in the Basement (turn left off the elevator) for the tests to be done today  You will be contacted by phone if any changes need to be made immediately.  Otherwise, you will receive a letter about your results with an explanation, but please check with MyChart first.  Please remember to sign up for MyChart if you have not done so, as this will be important to you in the future with finding out test results, communicating by private email, and scheduling acute appointments online when needed.  Please return in 6 months, or sooner if needed

## 2019-01-11 DIAGNOSIS — J3489 Other specified disorders of nose and nasal sinuses: Secondary | ICD-10-CM | POA: Diagnosis not present

## 2019-01-11 DIAGNOSIS — J310 Chronic rhinitis: Secondary | ICD-10-CM | POA: Diagnosis not present

## 2019-01-11 DIAGNOSIS — J342 Deviated nasal septum: Secondary | ICD-10-CM | POA: Diagnosis not present

## 2019-01-11 DIAGNOSIS — J329 Chronic sinusitis, unspecified: Secondary | ICD-10-CM | POA: Diagnosis not present

## 2019-01-26 ENCOUNTER — Telehealth: Payer: Self-pay | Admitting: Internal Medicine

## 2019-01-26 MED ORDER — LINACLOTIDE 72 MCG PO CAPS: 72.0000 ug | ORAL_CAPSULE | Freq: Every day | ORAL | 5 refills | Status: DC | PRN

## 2019-01-26 NOTE — Telephone Encounter (Signed)
Copied from [place] [id number]. Topic: General - Other >> Jan 26, 2019 11:37 AM Lennox Solders wrote: Reason for CRM: pt saw dr plotnikov on 07/11/18 and was given samples of linzess. Pt would like new rx linzess send to [address]. That medication help her with constipation

## 2019-01-26 NOTE — Telephone Encounter (Signed)
Done erx 

## 2019-01-26 NOTE — Addendum Note (Signed)
Addended by: Biagio Borg on: 01/26/2019 12:09 PM   Modules accepted: Orders

## 2019-01-30 ENCOUNTER — Telehealth: Payer: Self-pay | Admitting: Internal Medicine

## 2019-01-30 DIAGNOSIS — R109 Unspecified abdominal pain: Secondary | ICD-10-CM

## 2019-01-30 NOTE — Addendum Note (Signed)
Addended by: Biagio Borg on: 01/30/2019 07:01 PM   Modules accepted: Orders

## 2019-01-30 NOTE — Telephone Encounter (Signed)
Pt states that the Linzess is too expensive for her and she wants to know if she can have a generic medication or something different. Pt uses  [id number] [address], [place] [phone] (Phone) [phone] (Fax)

## 2019-01-30 NOTE — Telephone Encounter (Signed)
Ok to continue the lactulose instead, as there is no generic for linzess

## 2019-01-30 NOTE — Telephone Encounter (Signed)
Ok, the referral is done

## 2019-01-30 NOTE — Addendum Note (Signed)
Addended by: Biagio Borg on: 01/30/2019 06:59 PM   Modules accepted: Orders

## 2019-01-30 NOTE — Telephone Encounter (Signed)
Copied from [place] [id number]. Topic: Referral - Request for Referral >> Jan 30, 2019  2:20 PM Reyne Dumas L wrote: Has patient seen PCP for this complaint? yes *If NO, is insurance requiring patient see PCP for this issue before PCP can refer them? Referral for which specialty: stomach doctor Preferred provider/office: no preference Reason for referral: Pt states that she has stomach pain on and off and Dr. Jenny Reichmann states he can't find out why - pt would like to be referred to a specialist.  Pt can be reached at [phone]

## 2019-02-03 ENCOUNTER — Encounter: Payer: Self-pay | Admitting: Internal Medicine

## 2019-02-03 NOTE — Telephone Encounter (Signed)
Patient called checking on the status of referral, informed patient of message below. Patient states symptoms have not improved.

## 2019-02-03 NOTE — Telephone Encounter (Signed)
GI appt is already scheduled

## 2019-02-03 NOTE — Telephone Encounter (Signed)
Patient informed of message below and states since there's not a generic is there an alternate, please advise

## 2019-02-17 ENCOUNTER — Ambulatory Visit: Payer: Medicare Other | Admitting: Internal Medicine

## 2019-03-14 ENCOUNTER — Telehealth: Payer: Self-pay

## 2019-03-14 NOTE — Telephone Encounter (Signed)
Covid-19 travel screening questions  Have you traveled in the last 14 days? No If yes where?  Do you now or have you had a fever in the last 14 days? No  Do you have any respiratory symptoms of shortness of breath or cough now or in the last 14 days? No  Do you have a medical history of Congestive Heart Failure? N/A  Do you have a medical history of lung disease? N/A  Do you have any family members or close contacts with diagnosed or suspected Covid-19? No  Pt plans to keep OV as scheduled.        

## 2019-03-15 ENCOUNTER — Ambulatory Visit: Payer: Medicare Other | Admitting: Physician Assistant

## 2019-03-15 ENCOUNTER — Encounter: Payer: Self-pay | Admitting: Physician Assistant

## 2019-03-15 ENCOUNTER — Other Ambulatory Visit: Payer: Self-pay

## 2019-03-15 VITALS — BP 180/100 | HR 84 | Temp 99.5°F | Ht 62.0 in | Wt 140.0 lb

## 2019-03-15 DIAGNOSIS — R1084 Generalized abdominal pain: Secondary | ICD-10-CM | POA: Diagnosis not present

## 2019-03-15 DIAGNOSIS — K5900 Constipation, unspecified: Secondary | ICD-10-CM | POA: Diagnosis not present

## 2019-03-15 DIAGNOSIS — R140 Abdominal distension (gaseous): Secondary | ICD-10-CM

## 2019-03-15 NOTE — Patient Instructions (Addendum)
If you are age 75 or older, your body mass index should be between 23-30. Your Body mass index is 25.61 kg/m. If this is out of the aforementioned range listed, please consider follow up with your Primary Care Provider.  If you are age 5 or younger, your body mass index should be between 19-25. Your Body mass index is 25.61 kg/m. If this is out of the aformentioned range listed, please consider follow up with your Primary Care Provider.   Start Miralax twice daily.  Can increase to four times daily if needed.   Increase Fiber 25-35 grams daily.  You have been given a High Fiber handout.  Drink water 6-8 8 ounce glasses a day.  Follow up with me in one month.  The schedule is not available at this time.  Please call the office in a week or two to make an appointment.  Thank you for choosing me and [facility].    Ellouise Newer, PA-C   To help prevent the possible spread of infection to our patients, communities, and staff; we will be implementing the following measures:  Please only allow one visitor/family member to accompany you to any upcoming appointments with [facility]. If you have any concerns about this please contact our office to discuss prior to the appointment.

## 2019-03-15 NOTE — Progress Notes (Signed)
Assessment and plan reviewed 

## 2019-03-15 NOTE — Progress Notes (Signed)
Chief Complaint: Abdominal pain, constipation, bloating  HPI:    Elaine Turner is a 72 year old African-American female with a past medical history as listed below, known to Dr. Henrene Pastor, who was referred to me by Biagio Borg, MD for a complaint of abdominal pain, constipation, bloating.    05/17/2013 colonoscopy with severe diverticulosis throughout the entire colon and otherwise normal.    05/08/2013 last office visit with Elaine Turner, that time to discuss anemia.    01/06/2019 office visit with PCP discussed abdominal pain x2 weeks, intermittent, sharp, mild to moderate and stabbing at times with a decrease in appetite.  Described taking lactulose for successful bowel movement.     Today, the patient explains that she feels like she is not having a complete bowel movement and that her stomach feels full and she has a lot of gas and bloating.  Tells me she can only eat 2-3 bites of food because she always feels "full up".  Describes that she will have some bowel movements maybe every 2 days but they are just 2-3 little bits.  Has tried Lactulose and milk of magnesia in the past which is of no help.  Due to this describes a generalized abdominal discomfort which makes it harder for her to sleep.  Describes a weight loss due to decreased intake.    Does tell me she does not eat a very high-fiber diet and typically only drinks at least 1 glass of water a day.    Denies fever, chills, blood in her stool, nausea, vomiting or symptoms that awaken her from sleep.    Past Medical History:  Diagnosis Date  . Arthritis   . Chronic diarrhea   . COPD  GOLD 0 / active smoker  10/24/2015  . Heart murmur   . Hemorrhoids   . Hyperglycemia 05/01/2013  . Hypertension   . Pneumonia 05/01/2013  . PVD (peripheral vascular disease) ([place])   . Urine incontinence     Past Surgical History:  Procedure Laterality Date  . ABDOMINAL HYSTERECTOMY    . HEMORRHOID SURGERY N/A 06/26/2015   Procedure: INTERNAL AND EXTERNAL  HEMORRHOIDECTOMY;  Surgeon: Georganna Skeans, MD;  Location: [facility];  Service: General;  Laterality: N/A;    Current Outpatient Medications  Medication Sig Dispense Refill  . aspirin 81 MG EC tablet Take 1 tablet (81 mg total) by mouth daily. Swallow whole. 30 tablet 12  . cetirizine (ZYRTEC) 10 MG tablet Take 1 tablet (10 mg total) by mouth daily as needed for allergies. 30 tablet 11  . fluticasone (FLONASE) 50 MCG/ACT nasal spray Place 2 sprays into both nostrils daily. 16 g 6  . hydrochlorothiazide (HYDRODIURIL) 25 MG tablet TAKE 1 TABLET (25 MG TOTAL) BY MOUTH DAILY. 90 tablet 1  . lactulose (CHRONULAC) 10 GM/15ML solution Take 45 mLs (30 g total) by mouth 2 (two) times daily as needed for mild constipation. 473 mL 5  . losartan (COZAAR) 25 MG tablet TAKE 1 TABLET BY MOUTH TWICE A DAY 180 tablet 0  . NIFEdipine (PROCARDIA XL/ADALAT-CC) 90 MG 24 hr tablet TAKE 1 TABLET (90 MG TOTAL) BY MOUTH DAILY. 90 tablet 3  . predniSONE (DELTASONE) 10 MG tablet 3 tabs by mouth per day for 3 days,2tabs per day for 3 days,1tab per day for 3 days 18 tablet 0  . zolpidem (AMBIEN) 5 MG tablet Take 1 tablet (5 mg total) by mouth at bedtime as needed for sleep. 30 tablet 5   No current facility-administered  medications for this visit.     Allergies as of 03/15/2019 - Review Complete 03/15/2019  Allergen Reaction Noted  . Ace inhibitors Cough 09/08/2012  . Levaquin [levofloxacin] Other (See Comments) 08/18/2017    Family History  Problem Relation Age of Onset  . Hypertension Mother   . Lung cancer Father     Social History   Socioeconomic History  . Marital status: Married    Spouse name: Not on file  . Number of children: Not on file  . Years of education: 72  . Highest education level: Not on file  Occupational History  . Occupation: retired  Scientific laboratory technician  . Financial resource strain: Not on file  . Food insecurity:    Worry: Not on file    Inability: Not on file  .  Transportation needs:    Medical: Not on file    Non-medical: Not on file  Tobacco Use  . Smoking status: Current Every Day Smoker    Packs/day: 1.50    Years: 58.00    Pack years: 87.00    Types: Cigarettes  . Smokeless tobacco: Never Used  Substance and Sexual Activity  . Alcohol use: Yes    Alcohol/week: 1.0 standard drinks    Types: 1 Glasses of wine per week    Comment: occassional  . Drug use: No  . Sexual activity: Never  Lifestyle  . Physical activity:    Days per week: Not on file    Minutes per session: Not on file  . Stress: Not on file  Relationships  . Social connections:    Talks on phone: Not on file    Gets together: Not on file    Attends religious service: Not on file    Active member of club or organization: Not on file    Attends meetings of clubs or organizations: Not on file    Relationship status: Not on file  . Intimate partner violence:    Fear of current or ex partner: Not on file    Emotionally abused: Not on file    Physically abused: Not on file    Forced sexual activity: Not on file  Other Topics Concern  . Not on file  Social History Narrative  . Not on file    Review of Systems:    Constitutional: No weight loss, fever or chills Skin: No rash  Cardiovascular: No chest pain Respiratory: No SOB Gastrointestinal: See HPI and otherwise negative Genitourinary: No dysuria  Neurological: No headache, dizziness or syncope Musculoskeletal: No new muscle or joint pain Hematologic: No bleeding  Psychiatric: No history of depression or anxiety   Physical Exam:  Vital signs: BP (!) 180/100   Pulse 84   Temp 99.5 F (37.5 C)   Ht 5\' 2"  (1.575 m)   Wt 140 lb (63.5 kg)   BMI 25.61 kg/m   Constitutional:  Very Pleasant AA female appears to be in NAD, Well developed, Well nourished, alert and cooperative Head:  Normocephalic and atraumatic. Eyes:   PEERL, EOMI. No icterus. Conjunctiva pink. Ears:  Normal auditory acuity. Neck:  Supple  Throat: Oral cavity and pharynx without inflammation, swelling or lesion.  Respiratory: Respirations even and unlabored. Lungs clear to auscultation bilaterally.   No wheezes, crackles, or rhonchi.  Cardiovascular: Normal S1, S2. No MRG. Regular rate and rhythm. No peripheral edema, cyanosis or pallor.  Gastrointestinal:  Soft, nondistended, mild generalized ttp. No rebound or guarding. Normal bowel sounds. No appreciable masses or hepatomegaly. Rectal:  Not performed.  Msk:  Symmetrical without gross deformities. Without edema, no deformity or joint abnormality.  Neurologic:  Alert and  oriented x4;  grossly normal neurologically.  Skin:   Dry and intact without significant lesions or rashes. Psychiatric: Demonstrates good judgement and reason without abnormal affect or behaviors.  No recent labs.  Assessment: 1.  Constipation: "Worse in her old age", now with small pieces of stool every 2 or 3 days, unhelped by Lactulose and Milk of magnesia, Linzess too expensive; likely due to diet/decreased water intake and age 31.  Generalized abdominal discomfort 3.  Bloating  Plan: 1.  Recommend patient increase fiber in her diet to at least 25-35 g/day with use of fiber supplement such as Metamucil, Citrucel or Benefiber. 2.  Increase water intake to at least 6-8 eight ounce glasses of water per day. 3.  Start MiraLAX.  Discussed starting this twice daily and increasing up to 4 times daily if necessary. 4.  If above is not helping patient is to call our clinic in 2 weeks.  At that time would recommend A[facility], can start with samples. 5.  Patient to follow in clinic with me or Dr. Henrene Pastor in 1 month.  Ellouise Newer, PA-C [facility] 03/15/2019, 11:01 AM  Cc: Biagio Borg, MD

## 2019-03-16 ENCOUNTER — Telehealth: Payer: Self-pay | Admitting: Internal Medicine

## 2019-03-16 DIAGNOSIS — I10 Essential (primary) hypertension: Secondary | ICD-10-CM

## 2019-03-16 MED ORDER — NIFEDIPINE ER OSMOTIC RELEASE 90 MG PO TB24: ORAL_TABLET | ORAL | 1 refills | Status: DC

## 2019-03-16 MED ORDER — LOSARTAN POTASSIUM 25 MG PO TABS: 25.0000 mg | ORAL_TABLET | Freq: Two times a day (BID) | ORAL | 1 refills | Status: DC

## 2019-03-16 NOTE — Telephone Encounter (Signed)
Copied from [place] [id number]. Topic: Quick Communication - Rx Refill/Question >> Mar 16, 2019 10:13 AM Ahmed Prima L wrote: Medication: losartan (COZAAR) 25 MG tablet & NIFEdipine (PROCARDIA XL/ADALAT-CC) 90 MG 24 hr tablet  Has the patient contacted their pharmacy? Yes needs new scripts  (Agent: If no, request that the patient contact the pharmacy for the refill.) (Agent: If yes, when and what did the pharmacy advise?)  Preferred Pharmacy (with phone number or street name): [id number] [address], [place] [address] Phone: [phone] Fax: [phone]    Agent: Please be advised that RX refills may take up to 3 business days. We ask that you follow-up with your pharmacy.

## 2019-04-14 ENCOUNTER — Telehealth: Payer: Self-pay | Admitting: Internal Medicine

## 2019-04-14 NOTE — Telephone Encounter (Signed)
Tried to call patient. No answer and unable to leave a message. We will mail the patient a couple masks but was also going to recommend making her own from a scarf or handkerchief if needed.

## 2019-04-14 NOTE — Telephone Encounter (Signed)
Copied from [place] [id number]. Topic: Quick Communication - See Telephone Encounter >> Apr 14, 2019 12:29 PM Blase Mess A wrote: CRM for notification. See Telephone encounter for: 04/14/19.  The patient is calling to inquire where she can get a mask from. Please advise CB- [phone]

## 2019-04-17 ENCOUNTER — Encounter: Payer: Self-pay | Admitting: *Deleted

## 2019-04-18 ENCOUNTER — Encounter: Payer: Self-pay | Admitting: Internal Medicine

## 2019-04-18 ENCOUNTER — Other Ambulatory Visit: Payer: Self-pay

## 2019-04-18 ENCOUNTER — Ambulatory Visit (INDEPENDENT_AMBULATORY_CARE_PROVIDER_SITE_OTHER): Payer: Medicare Other | Admitting: Internal Medicine

## 2019-04-18 VITALS — Ht 62.0 in | Wt 140.0 lb

## 2019-04-18 DIAGNOSIS — K5900 Constipation, unspecified: Secondary | ICD-10-CM

## 2019-04-18 DIAGNOSIS — R140 Abdominal distension (gaseous): Secondary | ICD-10-CM | POA: Diagnosis not present

## 2019-04-18 DIAGNOSIS — R1084 Generalized abdominal pain: Secondary | ICD-10-CM | POA: Diagnosis not present

## 2019-04-18 NOTE — Progress Notes (Signed)
HISTORY OF PRESENT ILLNESS:  Elaine Turner is a 73 y.o. female who presents today via telemedicine encounter due to coronavirus pandemic with chief complaints of constipation with abdominal bloating discomfort and increased flatus.  Patient was evaluated in this office March 15, 2019 by the GI physician assistant with constipation, generalized abdominal discomfort, and bloating.  See that dictation.  She was advised with regards to fiber, water, and MiraLAX.  She was placed on lactulose by her primary care provider, which she continues to take.  She took low-dose MiraLAX somewhat sporadically for 2 weeks.  Patient tells me that her symptom complex is essentially the same.  Discomfort seems to be mostly in the left lower quadrant.  She also describes some pressure in the anus with defecation and the passage of flatus.  She states that she has had decreased appetite and possibly few pound weight loss.  No bleeding.  Review of outside laboratories from August 2019 finds mild anemia with hemoglobin 11.8 and MCV 75.5.  Her last complete colonoscopy was performed in 2014.  She was found to have severe pandiverticulosis and internal hemorrhoids.  Patient currently denies rectal bleeding.  Prior colonoscopy in 1999 was also negative for neoplasia.  She did undergo a contrast-enhanced CT scan of the abdomen and pelvis February 2019 to evaluate diffuse abdominal pain and elevated white blood cell count.  She was found to have uncomplicated sigmoid diverticulitis.  Currently denies fevers.  She is status post hemorrhoidectomy in 2016.  Remote upper endoscopy in 1999 revealed an incidental esophageal stricture but was otherwise negative.  REVIEW OF SYSTEMS:  All non-GI ROS negative unless otherwise stated in the HPI except for arthritis  Past Medical History:  Diagnosis Date  . 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 Aortic atherosclerosis ([place])   .  Arthritis   . Chronic diarrhea   . COPD  GOLD 0 / active smoker  10/24/2015  . Diverticulitis   . Diverticulosis   . Esophageal stricture   . Heart murmur   . Hemorrhoids   . Hyperglycemia 05/01/2013  . Hypertension   . Pneumonia 05/01/2013  . PVD (peripheral vascular disease) ([place])   . Urine incontinence     Past Surgical History:  Procedure Laterality Date  . ABDOMINAL HYSTERECTOMY    . HEMORRHOID SURGERY N/A 06/26/2015   Procedure: INTERNAL AND EXTERNAL HEMORRHOIDECTOMY;  Surgeon: Georganna Skeans, MD;  Location: [facility];  Service: General;  Laterality: N/A;    Social History [place]  reports that she has been smoking cigarettes. She has a 87.00 pack-year smoking history. She has never used smokeless tobacco. She reports current alcohol use of about 1.0 standard drinks of alcohol per week. She reports that she does not use drugs.  family history includes Hypertension in her mother; Lung cancer in her father.  Allergies  Allergen Reactions  . Ace Inhibitors Cough  . Levaquin [Levofloxacin] Other (See Comments)    GI upset       PHYSICAL EXAMINATION: No physical examination with telemedicine encounter    ASSESSMENT:  1.  Chronic constipation.  Ongoing 2.  Abdominal pain seemingly related to constipation 3.  Abdominal bloating and increased flatus 4.  Colonoscopy 1999 in 2014 with diverticulosis 5.  History of uncomplicated diverticulitis February 2019 6.  Mild microcytic anemia last August   PLAN:  1.  Instructed to stop lactulose as this may cause or exacerbate bloating 2.  Instructed  to take MiraLAX daily.  Furthermore increased dosage to achieve at least 1-2 bowel movements daily 3.  Instructed to contact the office in 1 week for follow-up.  She agreed.  If the patient has significant improvement with regulated bowel habits, then no further evaluation required. 4.  However, if patient has ongoing symptoms despite adequate bowel movements  then further evaluation to be considered.  I would start with CT imaging of the abdomen and pelvis.  If that was negative, would consider interval colonoscopy given the mild microcytic anemia last August (which was a slight change from her baseline).  This telemedicine visit in this Medicare patient was audio only (patient did not have video capacity), initiated and consented for by the patient who was in her home while I was in my office.  She understands her may be an associated professional charge for this encounter

## 2019-05-16 ENCOUNTER — Ambulatory Visit: Payer: Self-pay

## 2019-05-16 MED ORDER — NYSTATIN 100000 UNIT/ML MT SUSP: 500000.0000 [IU] | Freq: Four times a day (QID) | OROMUCOSAL | 0 refills | Status: AC

## 2019-05-16 NOTE — Telephone Encounter (Signed)
[place] for trial nystatin soln - done erx

## 2019-05-16 NOTE — Telephone Encounter (Signed)
Incoming call from Patient with complaint of tongue burning and irritated .  Patient stated onset was 2 weeks ago.  Patient did sat that she had changed denture cream at that time.  I could be irritating her tongue.  Patient states that she will be going to the store to purchase another brand. Patient doses not complain of Pain. Just irritated and burning .  Awaits to hear from PCP reviews care advice Patient voiced understanding.    Reason for Disposition . [1] Dry mouth AND [2] not drinking enough liquids  Answer Assessment - Initial Assessment Questions 1. SYMPTOM: "What's the main symptom you're concerned about?" (e.g., dry mouth. chapped lips, lump)     Burning  Sensation ,  tingling 2. ONSET: "When did the    start?"     *2 weeks ago 3. PAIN: "Is there any pain?" If so, ask: "How bad is it?" (Scale: 1-10; mild, moderate, severe)     moderate 4. CAUSE: "What do you think is causing the symptoms?"     *No Answer* 5. OTHER SYMPTOMS: "Do you have any other symptoms?" (e.g., fever, sore throat, toothache, swelling)     denies 6. PREGNANCY: "Is there any chance you are pregnant?" "When was your last menstrual period?"     na  Protocols used: MOUTH [facility]

## 2019-05-16 NOTE — Addendum Note (Signed)
Addended by: Biagio Borg on: 05/16/2019 12:36 PM   Modules accepted: Orders

## 2019-05-24 ENCOUNTER — Other Ambulatory Visit: Payer: Self-pay

## 2019-05-24 ENCOUNTER — Encounter (HOSPITAL_COMMUNITY): Payer: Self-pay

## 2019-05-24 ENCOUNTER — Inpatient Hospital Stay (HOSPITAL_COMMUNITY): Payer: Medicare Other

## 2019-05-24 ENCOUNTER — Observation Stay (HOSPITAL_COMMUNITY)
Admission: EM | Admit: 2019-05-24 | Discharge: 2019-05-25 | Disposition: A | Discharge status: Home / Self Care | Payer: Medicare Other | Attending: Internal Medicine | Admitting: Internal Medicine

## 2019-05-24 DIAGNOSIS — I639 Cerebral infarction, unspecified: Principal | ICD-10-CM | POA: Insufficient documentation

## 2019-05-24 DIAGNOSIS — I10 Essential (primary) hypertension: Secondary | ICD-10-CM

## 2019-05-24 DIAGNOSIS — E785 Hyperlipidemia, unspecified: Secondary | ICD-10-CM | POA: Diagnosis present

## 2019-05-24 DIAGNOSIS — Z881 Allergy status to other antibiotic agents status: Secondary | ICD-10-CM | POA: Diagnosis not present

## 2019-05-24 DIAGNOSIS — Z03818 Encounter for observation for suspected exposure to other biological agents ruled out: Secondary | ICD-10-CM | POA: Diagnosis not present

## 2019-05-24 DIAGNOSIS — N183 Chronic kidney disease, stage 3 unspecified: Secondary | ICD-10-CM | POA: Diagnosis present

## 2019-05-24 DIAGNOSIS — H47012 Ischemic optic neuropathy, left eye: Secondary | ICD-10-CM | POA: Diagnosis not present

## 2019-05-24 DIAGNOSIS — I63233 Cerebral infarction due to unspecified occlusion or stenosis of bilateral carotid arteries: Secondary | ICD-10-CM | POA: Diagnosis not present

## 2019-05-24 DIAGNOSIS — H5462 Unqualified visual loss, left eye, normal vision right eye: Secondary | ICD-10-CM | POA: Diagnosis not present

## 2019-05-24 DIAGNOSIS — F17210 Nicotine dependence, cigarettes, uncomplicated: Secondary | ICD-10-CM | POA: Insufficient documentation

## 2019-05-24 DIAGNOSIS — E876 Hypokalemia: Secondary | ICD-10-CM | POA: Insufficient documentation

## 2019-05-24 DIAGNOSIS — Z961 Presence of intraocular lens: Secondary | ICD-10-CM | POA: Diagnosis not present

## 2019-05-24 DIAGNOSIS — H3410 Central retinal artery occlusion, unspecified eye: Secondary | ICD-10-CM | POA: Diagnosis present

## 2019-05-24 DIAGNOSIS — I361 Nonrheumatic tricuspid (valve) insufficiency: Secondary | ICD-10-CM | POA: Diagnosis not present

## 2019-05-24 DIAGNOSIS — I129 Hypertensive chronic kidney disease with stage 1 through stage 4 chronic kidney disease, or unspecified chronic kidney disease: Secondary | ICD-10-CM | POA: Insufficient documentation

## 2019-05-24 DIAGNOSIS — M1990 Unspecified osteoarthritis, unspecified site: Secondary | ICD-10-CM | POA: Diagnosis not present

## 2019-05-24 DIAGNOSIS — Z79899 Other long term (current) drug therapy: Secondary | ICD-10-CM | POA: Insufficient documentation

## 2019-05-24 DIAGNOSIS — H10413 Chronic giant papillary conjunctivitis, bilateral: Secondary | ICD-10-CM | POA: Diagnosis not present

## 2019-05-24 DIAGNOSIS — Z1159 Encounter for screening for other viral diseases: Secondary | ICD-10-CM | POA: Insufficient documentation

## 2019-05-24 DIAGNOSIS — Z888 Allergy status to other drugs, medicaments and biological substances status: Secondary | ICD-10-CM | POA: Diagnosis not present

## 2019-05-24 DIAGNOSIS — I739 Peripheral vascular disease, unspecified: Secondary | ICD-10-CM | POA: Diagnosis not present

## 2019-05-24 DIAGNOSIS — H3412 Central retinal artery occlusion, left eye: Secondary | ICD-10-CM | POA: Diagnosis not present

## 2019-05-24 DIAGNOSIS — J449 Chronic obstructive pulmonary disease, unspecified: Secondary | ICD-10-CM | POA: Diagnosis not present

## 2019-05-24 DIAGNOSIS — F17200 Nicotine dependence, unspecified, uncomplicated: Secondary | ICD-10-CM | POA: Diagnosis present

## 2019-05-24 DIAGNOSIS — H35371 Puckering of macula, right eye: Secondary | ICD-10-CM | POA: Diagnosis not present

## 2019-05-24 DIAGNOSIS — Z7982 Long term (current) use of aspirin: Secondary | ICD-10-CM | POA: Diagnosis not present

## 2019-05-24 DIAGNOSIS — I4891 Unspecified atrial fibrillation: Secondary | ICD-10-CM | POA: Diagnosis not present

## 2019-05-24 DIAGNOSIS — H35372 Puckering of macula, left eye: Secondary | ICD-10-CM | POA: Diagnosis not present

## 2019-05-24 DIAGNOSIS — N184 Chronic kidney disease, stage 4 (severe): Secondary | ICD-10-CM | POA: Diagnosis present

## 2019-05-24 LAB — COMPREHENSIVE METABOLIC PANEL
ALT: 12 U/L (ref 0–44)
AST: 13 U/L — ABNORMAL LOW (ref 15–41)
Albumin: 3.5 g/dL (ref 3.5–5.0)
Alkaline Phosphatase: 90 U/L (ref 38–126)
Anion gap: 13 (ref 5–15)
BUN: 10 mg/dL (ref 8–23)
CO2: 20 mmol/L — ABNORMAL LOW (ref 22–32)
Calcium: 9.1 mg/dL (ref 8.9–10.3)
Chloride: 104 mmol/L (ref 98–111)
Creatinine, Ser: 1.16 mg/dL — ABNORMAL HIGH (ref 0.44–1.00)
GFR calc Af Amer: 53 mL/min — ABNORMAL LOW (ref >60)
GFR calc non Af Amer: 46 mL/min — ABNORMAL LOW (ref >60)
Glucose, Bld: 101 mg/dL — ABNORMAL HIGH (ref 70–99)
Potassium: 3.1 mmol/L — ABNORMAL LOW (ref 3.5–5.1)
Sodium: 137 mmol/L (ref 135–145)
Total Bilirubin: 0.9 mg/dL (ref 0.3–1.2)
Total Protein: 6.8 g/dL (ref 6.5–8.1)

## 2019-05-24 LAB — CBC
HCT: 49.1 % — ABNORMAL HIGH (ref 36.0–46.0)
Hemoglobin: 14.9 g/dL (ref 12.0–15.0)
MCH: 23 pg — ABNORMAL LOW (ref 26.0–34.0)
MCHC: 30.3 g/dL (ref 30.0–36.0)
MCV: 75.8 fL — ABNORMAL LOW (ref 80.0–100.0)
Platelets: 409 10*3/uL — ABNORMAL HIGH (ref 150–400)
RBC: 6.48 MIL/uL — ABNORMAL HIGH (ref 3.87–5.11)
RDW: 19 % — ABNORMAL HIGH (ref 11.5–15.5)
WBC: 10.3 10*3/uL (ref 4.0–10.5)
nRBC: 0 % (ref 0.0–0.2)

## 2019-05-24 LAB — URINALYSIS, ROUTINE W REFLEX MICROSCOPIC
Bacteria, UA: NONE SEEN
Bilirubin Urine: NEGATIVE
Glucose, UA: NEGATIVE mg/dL
Ketones, ur: NEGATIVE mg/dL
Leukocytes,Ua: NEGATIVE
Nitrite: NEGATIVE
Protein, ur: 100 mg/dL — AB
Specific Gravity, Urine: 1.005 (ref 1.005–1.030)
pH: 6 (ref 5.0–8.0)

## 2019-05-24 LAB — DIFFERENTIAL
Abs Immature Granulocytes: 0.03 10*3/uL (ref 0.00–0.07)
Basophils Absolute: 0.1 10*3/uL (ref 0.0–0.1)
Basophils Relative: 1 %
Eosinophils Absolute: 0.1 10*3/uL (ref 0.0–0.5)
Eosinophils Relative: 1 %
Immature Granulocytes: 0 %
Lymphocytes Relative: 13 %
Lymphs Abs: 1.4 10*3/uL (ref 0.7–4.0)
Monocytes Absolute: 0.6 10*3/uL (ref 0.1–1.0)
Monocytes Relative: 6 %
Neutro Abs: 8.1 10*3/uL — ABNORMAL HIGH (ref 1.7–7.7)
Neutrophils Relative %: 79 %

## 2019-05-24 LAB — RAPID URINE DRUG SCREEN, HOSP PERFORMED
Amphetamines: NOT DETECTED
Barbiturates: NOT DETECTED
Benzodiazepines: NOT DETECTED
Cocaine: NOT DETECTED
Opiates: NOT DETECTED
Tetrahydrocannabinol: NOT DETECTED

## 2019-05-24 LAB — ECHOCARDIOGRAM COMPLETE
Height: 62 in
Weight: 2240 oz

## 2019-05-24 LAB — APTT: aPTT: 32 seconds (ref 24–36)

## 2019-05-24 LAB — PROTIME-INR
INR: 1.1 (ref 0.8–1.2)
Prothrombin Time: 14.2 seconds (ref 11.4–15.2)

## 2019-05-24 LAB — SARS CORONAVIRUS 2 BY RT PCR (HOSPITAL ORDER, PERFORMED IN ~~LOC~~ HOSPITAL LAB): SARS Coronavirus 2: NEGATIVE

## 2019-05-24 LAB — TSH: TSH: 0.337 u[IU]/mL — ABNORMAL LOW (ref 0.350–4.500)

## 2019-05-24 LAB — C-REACTIVE PROTEIN: CRP: 1 mg/dL — ABNORMAL HIGH (ref <1.0)

## 2019-05-24 LAB — ETHANOL: Alcohol, Ethyl (B): <10 mg/dL (ref <10)

## 2019-05-24 LAB — SEDIMENTATION RATE: Sed Rate: 0 mm/hr (ref 0–22)

## 2019-05-24 IMAGING — MR MRI HEAD WITHOUT CONTRAST
10 of 12 series · 32 of 48 positions shown · non-contrast
Comparison: None.

CLINICAL DATA: Stroke. Retinal artery occlusion. Left eye vision
problem.

EXAM:
MRI HEAD WITHOUT CONTRAST
MRA HEAD WITHOUT CONTRAST
TECHNIQUE: Multiplanar, multiecho pulse sequences of the brain and surrounding
structures were obtained without intravenous contrast. Angiographic
images of the head were obtained using MRA technique without
contrast.

[Series 9: DWI · axial · 3.0mm · 0.88mm/px · z∈[-28,+104]mm · 6 of 90 slices shown (1 of 4)]
[im 1/90]
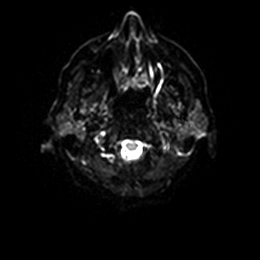
[im 18/90]
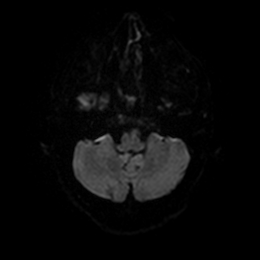
[im 36/90]
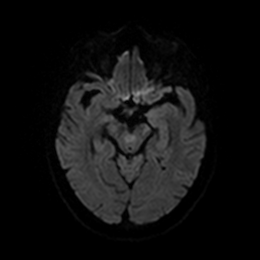
[im 54/90]
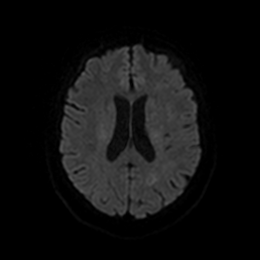
[im 72/90]
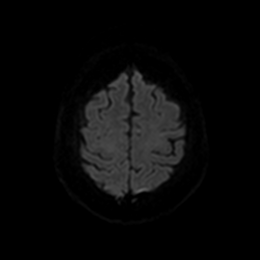
[im 90/90]
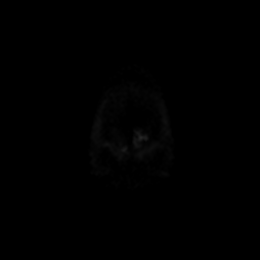

[Series 10: DWI · axial · 3.0mm · 0.88mm/px · z∈[-28,+104]mm · 3 of 45 slices shown (2 of 4)]
[im 1/45]
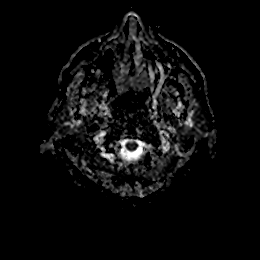
[im 23/45]
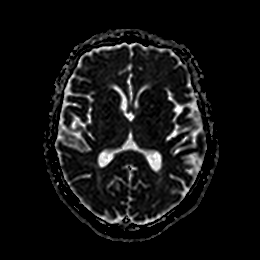
[im 45/45]
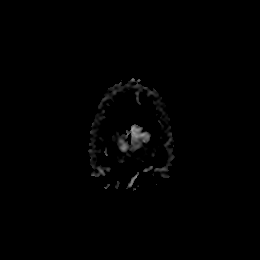

[Series 11: DWI · coronal · 4.0mm · 0.88mm/px · 5 of 64 slices shown (3 of 4)]
[im 1/64]
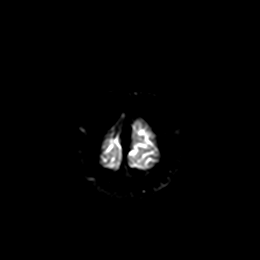
[im 16/64]
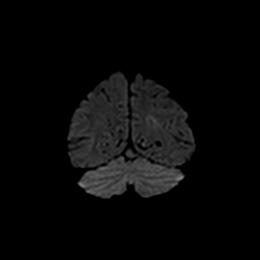
[im 32/64]
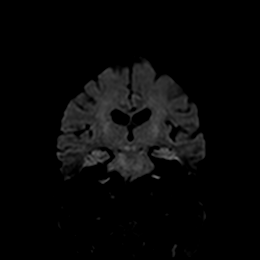
[im 48/64]
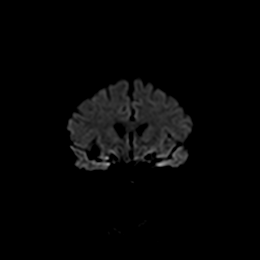
[im 64/64]
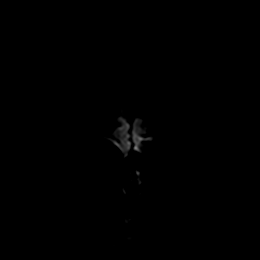

[Series 12: DWI · coronal · 4.0mm · 0.88mm/px · 2 of 32 slices shown (4 of 4)]
[im 1/32]
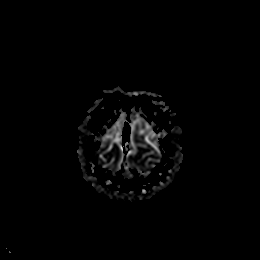
[im 32/32]
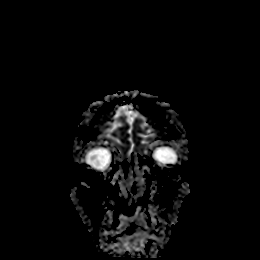

[Series 17: T1 · sagittal · 5.0mm · 0.75mm/px · 2 of 23 slices shown]
[im 1/23]
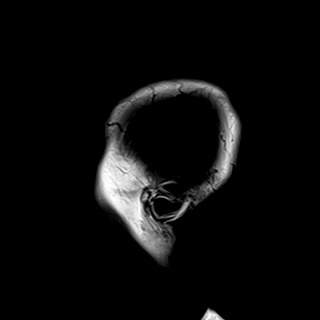
[im 23/23]
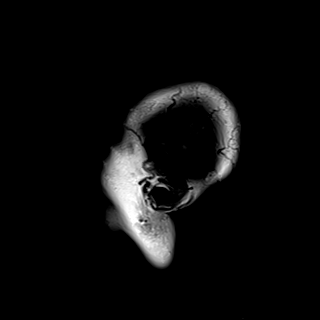

[Series 18: T2 · axial · 5.0mm · 0.72mm/px · z∈[-30,+113]mm · 2 of 25 slices shown]
[im 1/25]
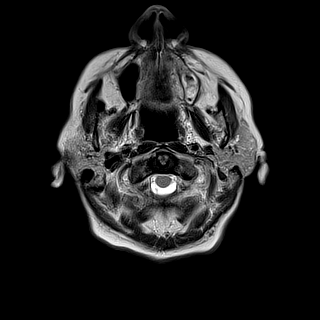
[im 25/25]
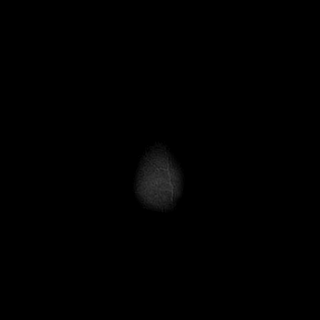

[Series 19: FLAIR · axial · 5.0mm · 0.45mm/px · z∈[-29,+115]mm · 2 of 25 slices shown]
[im 1/25]
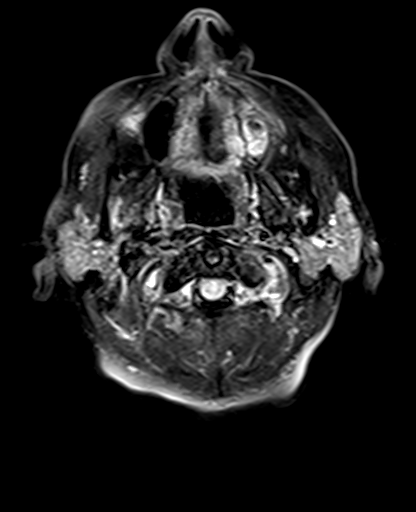
[im 25/25]
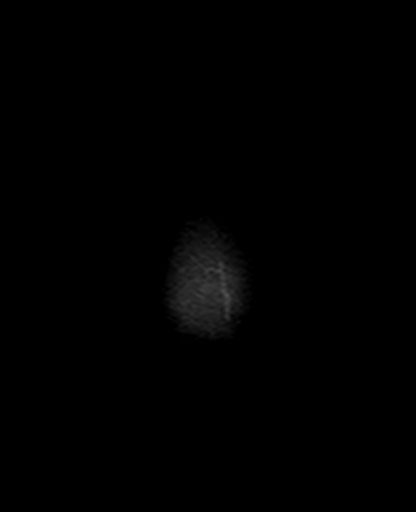

[Series 21: pha_images · axial · 3.0mm · 0.90mm/px · z∈[-42,+128]mm · 4 of 58 slices shown]
[im 1/58]
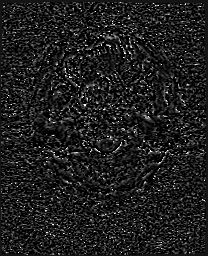
[im 20/58]
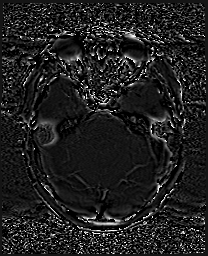
[im 39/58]
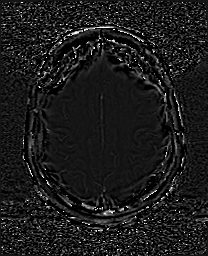
[im 58/58]
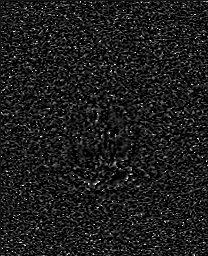

[Series 22: swi_images · axial · 3.0mm · 0.90mm/px · z∈[-45,+131]mm · 4 of 60 slices shown]
[im 1/60]
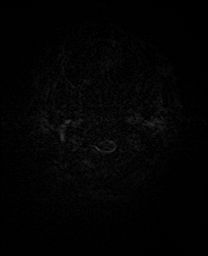
[im 20/60]
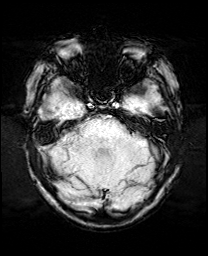
[im 40/60]
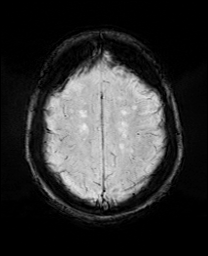
[im 60/60]
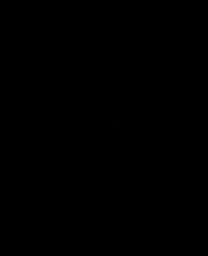

[Series 25: T2 post-contrast · coronal · 5.0mm · 0.72mm/px · 2 of 28 slices shown]
[im 1/28]
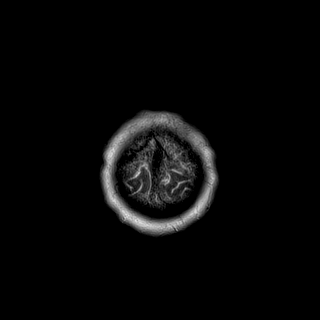
[im 28/28]
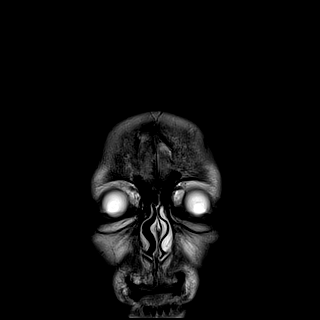

[32 of 48 positions shown; findings below may reference images not displayed]

FINDINGS: MRI HEAD FINDINGS

Brain: Negative for acute infarct.

Ventricle size and cerebral volume normal. Moderate chronic
microvascular ischemic change in the white matter and pons
bilaterally. Negative for hemorrhage or mass.

Vascular: Normal arterial flow voids

Skull and upper cervical spine: No acute skeletal abnormality.
Relatively large disc protrusion on the left at C3-4 with spinal
stenosis. Smaller central disc protrusion C4-5. Limited evaluation.

Sinuses/Orbits: Mucosal edema paranasal sinuses. Bilateral cataract
surgery.

Other: None

MRA HEAD FINDINGS

Both vertebral arteries patent to the basilar without stenosis. PICA
patent bilaterally. Basilar widely patent. Superior cerebellar and
posterior cerebral arteries patent bilaterally.

Atherosclerotic irregularity in the cavernous carotid bilaterally
with mild stenosis bilaterally. Anterior and middle cerebral
arteries patent bilaterally without significant stenosis.
IMPRESSION: 1. Negative for acute infarct. Moderate chronic microvascular
ischemic change in the white matter
2. Atherosclerotic irregularity and mild stenosis in the cavernous
carotid bilaterally. No large vessel occlusion.
3. Disc protrusions C3-4 and C4-5

## 2019-05-24 MED ORDER — ASPIRIN EC 325 MG PO TBEC
650.0000 mg | DELAYED_RELEASE_TABLET | Freq: Once | ORAL | Status: AC
  Administered 2019-05-24: 650 mg via ORAL
  Filled 2019-05-24: qty 2

## 2019-05-24 MED ORDER — SODIUM CHLORIDE 0.9 % IV SOLN
INTRAVENOUS | Status: DC
  Administered 2019-05-24: 18:00:00 via INTRAVENOUS

## 2019-05-24 MED ORDER — ATORVASTATIN CALCIUM 40 MG PO TABS
40.0000 mg | ORAL_TABLET | Freq: Every day | ORAL | Status: DC
  Administered 2019-05-24 – 2019-05-25 (×2): 40 mg via ORAL
  Filled 2019-05-24 (×2): qty 1

## 2019-05-24 MED ORDER — ACETAMINOPHEN 650 MG RE SUPP: 650.0000 mg | RECTAL | Status: DC | PRN

## 2019-05-24 MED ORDER — ASPIRIN EC 81 MG PO TBEC
81.0000 mg | DELAYED_RELEASE_TABLET | Freq: Every day | ORAL | Status: DC
  Administered 2019-05-25: 10:00:00 81 mg via ORAL
  Filled 2019-05-24: qty 1

## 2019-05-24 MED ORDER — NICOTINE 14 MG/24HR TD PT24
14.0000 mg | MEDICATED_PATCH | Freq: Every day | TRANSDERMAL | Status: DC
  Administered 2019-05-24: 14 mg via TRANSDERMAL
  Filled 2019-05-24 (×2): qty 1

## 2019-05-24 MED ORDER — ZOLPIDEM TARTRATE 5 MG PO TABS: 5.0000 mg | ORAL_TABLET | Freq: Every evening | ORAL | Status: DC | PRN

## 2019-05-24 MED ORDER — ENOXAPARIN SODIUM 40 MG/0.4ML ~~LOC~~ SOLN
40.0000 mg | SUBCUTANEOUS | Status: DC
  Administered 2019-05-25: 40 mg via SUBCUTANEOUS
  Filled 2019-05-24: qty 0.4

## 2019-05-24 MED ORDER — POTASSIUM CHLORIDE CRYS ER 20 MEQ PO TBCR
40.0000 meq | EXTENDED_RELEASE_TABLET | Freq: Once | ORAL | Status: AC
  Administered 2019-05-24: 40 meq via ORAL
  Filled 2019-05-24: qty 2

## 2019-05-24 MED ORDER — SENNOSIDES-DOCUSATE SODIUM 8.6-50 MG PO TABS: 1.0000 | ORAL_TABLET | Freq: Every evening | ORAL | Status: DC | PRN

## 2019-05-24 MED ORDER — ACETAMINOPHEN 160 MG/5ML PO SOLN: 650.0000 mg | ORAL | Status: DC | PRN

## 2019-05-24 MED ORDER — STROKE: EARLY STAGES OF RECOVERY BOOK
Freq: Once | Status: AC
  Administered 2019-05-24: 18:00:00
  Filled 2019-05-24: qty 1

## 2019-05-24 MED ORDER — ACETAMINOPHEN 325 MG PO TABS: 650.0000 mg | ORAL_TABLET | ORAL | Status: DC | PRN

## 2019-05-24 NOTE — ED Notes (Signed)
ED Provider at bedside. 

## 2019-05-24 NOTE — ED Triage Notes (Signed)
Pt sent by Ophthalmologist for loss of vision in L eye since last night at 1900. No other neuro deficits

## 2019-05-24 NOTE — Consult Note (Addendum)
Neurology Consultation  Reason for Consult: Left central retinal artery occlusion  Referring Physician: Dr. Ellender Hose  CC: Loss of vision in left eye  History is obtained from: Patient  HPI: Elaine Turner is a 73 y.o. female with history of hypertension, hyperglycemia, PVD, heart murmur, aortic atherosclerosis.  Patient states that she was on her computer last night at approximately 7:00 PM when she started to see spots in her left eye.  She then suddenly lost all vision in her left eye.  She denies any pain or headache at that time.  She denies ever having this issue prior to this event.  She noted that some of her vision came back at approximately 5:00 in the morning this a.m., but she continued to have spots in her left eye.  At that time she went and saw her eye doctor Dr. Katy Fitch.  She had a dilated eye exam which showed a CRAO on the left.  Patient was sent immediately to the emergency department to have a stroke work-up.  She states that she does not take any antiplatelet medications at this time, although aspirin is on her home medications list.    LKW: 1900 hrs. on 05/23/2019 tpa given?: no, symptoms minimal and out of window Premorbid modified Rankin scale (mRS): 0 NIH stroke scale of 0 ROS: A 14 point ROS was performed and is negative except as noted in the HPI.  Past Medical History:  Diagnosis Date  . 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 Aortic atherosclerosis ([place])   . Arthritis   . Chronic diarrhea   . COPD  GOLD 0 / active smoker  10/24/2015  . Diverticulitis   . Diverticulosis   . Esophageal stricture   . Heart murmur   . Hemorrhoids   . Hyperglycemia 05/01/2013  . Hypertension   . Pneumonia 05/01/2013  . PVD (peripheral vascular disease) ([place])   . Urine incontinence     Family History  Problem Relation Age of Onset  . Hypertension Mother   . Lung cancer Father   . Colon cancer Neg Hx   . Esophageal  cancer Neg Hx   . Pancreatic cancer Neg Hx   . Liver disease Neg Hx     Social History:   reports that she has been smoking cigarettes. She has a 60.00 pack-year smoking history. She has never used smokeless tobacco. She reports current alcohol use. She reports that she does not use drugs.  Medications No current facility-administered medications for this encounter.   Current Outpatient Medications:  .  aspirin 81 MG EC tablet, Take 1 tablet (81 mg total) by mouth daily. Swallow whole., Disp: 30 tablet, Rfl: 12 .  cetirizine (ZYRTEC) 10 MG tablet, Take 1 tablet (10 mg total) by mouth daily as needed for allergies., Disp: 30 tablet, Rfl: 11 .  fluticasone (FLONASE) 50 MCG/ACT nasal spray, Place 2 sprays into both nostrils daily., Disp: 16 g, Rfl: 6 .  hydrochlorothiazide (HYDRODIURIL) 25 MG tablet, TAKE 1 TABLET (25 MG TOTAL) BY MOUTH DAILY., Disp: 90 tablet, Rfl: 1 .  lactulose (CHRONULAC) 10 GM/15ML solution, Take 45 mLs (30 g total) by mouth 2 (two) times daily as needed for mild constipation., Disp: 473 mL, Rfl: 5 .  losartan (COZAAR) 25 MG tablet, Take 1 tablet (25 mg total) by mouth 2 (two) times daily., Disp: 180 tablet, Rfl: 1 .  NIFEdipine (PROCARDIA XL/NIFEDICAL-XL) 90 MG 24 hr tablet, TAKE 1 TABLET (90  MG TOTAL) BY MOUTH DAILY., Disp: 90 tablet, Rfl: 1 .  nystatin (MYCOSTATIN) 100000 UNIT/ML suspension, Take 5 mLs (500,000 Units total) by mouth 4 (four) times daily for 10 days., Disp: 60 mL, Rfl: 0 .  zolpidem (AMBIEN) 5 MG tablet, Take 1 tablet (5 mg total) by mouth at bedtime as needed for sleep., Disp: 30 tablet, Rfl: 5  Exam: Current vital signs: BP (!) 160/75 (BP Location: Right Arm)   Pulse 72   Temp 98.3 F (36.8 C) (Oral)   Resp (!) 22   Ht 5\' 2"  (1.575 m)   Wt 63.5 kg   SpO2 95%   BMI 25.61 kg/m  Vital signs in last 24 hours: Temp:  [98.3 F (36.8 C)-98.8 F (37.1 C)] 98.3 F (36.8 C) (05/27 1154) Pulse Rate:  [72-77] 72 (05/27 1154) Resp:  [22-23] 22 (05/27  1154) BP: (159-160)/(70-96) 160/75 (05/27 1154) SpO2:  [93 %-95 %] 95 % (05/27 1154) Weight:  [63.5 kg] 63.5 kg (05/27 1032)  Physical Exam  Constitutional: Appears well-developed and well-nourished.  Psych: Affect appropriate to situation Eyes: No scleral injection HENT: No OP obstrucion Head: Normocephalic.  Cardiovascular: Normal rate and regular rhythm.  Respiratory: Effort normal, non-labored breathing GI: Soft.  No distension. There is no tenderness.  Skin: WDI  Neuro: Mental Status: Patient is awake, alert, oriented to person, place, month, year, and situation. Patient is able to give a clear and coherent history. No signs of aphasia or neglect Cranial Nerves: II: Visual Fields are full.  However she states that she has spots in her left eye when looking at the television and at examiner. She has normal caliber vessels OD on funduscopic exam; decreased caliber or arterial and venous circulation on funduscopic exam OS, as well as some patchy retinal pallor.  III,IV, VI: EOMI without ptosis or diploplia. Pupils equal, 5 mm round not reactive to light secondary to having her eyes dilated at the ophthalmologist V: Facial sensation is symmetric to temperature VII: Facial movement is symmetric.  VIII: hearing is intact to voice X: Palat elevates symmetrically XI: Shoulder shrug is symmetric. XII: tongue is midline without atrophy or fasciculations.  Motor: Tone is normal. Bulk is normal. 5/5 strength was present in all four extremities.  Sensory: Sensation is symmetric to light touch and temperature in the arms and legs. Deep Tendon Reflexes: 2+ and symmetric in the biceps with 1+ at the patella Plantars: Toes are downgoing bilaterally.  Cerebellar: FNF and HKS are intact bilaterally  Labs I have reviewed labs in epic and the results pertinent to this consultation are:   CBC    Component Value Date/Time   WBC 10.3 05/24/2019 1105   RBC 6.48 (H) 05/24/2019 1105   HGB  14.9 05/24/2019 1105   HCT 49.1 (H) 05/24/2019 1105   PLT 409 (H) 05/24/2019 1105   MCV 75.8 (L) 05/24/2019 1105   MCH 23.0 (L) 05/24/2019 1105   MCHC 30.3 05/24/2019 1105   RDW 19.0 (H) 05/24/2019 1105   LYMPHSABS 1.4 05/24/2019 1105   MONOABS 0.6 05/24/2019 1105   EOSABS 0.1 05/24/2019 1105   BASOSABS 0.1 05/24/2019 1105    CMP     Component Value Date/Time   NA 137 05/24/2019 1105   K 3.1 (L) 05/24/2019 1105   CL 104 05/24/2019 1105   CO2 20 (L) 05/24/2019 1105   GLUCOSE 101 (H) 05/24/2019 1105   BUN 10 05/24/2019 1105   CREATININE 1.16 (H) 05/24/2019 1105   CALCIUM 9.1 05/24/2019 1105  PROT 6.8 05/24/2019 1105   ALBUMIN 3.5 05/24/2019 1105   AST 13 (L) 05/24/2019 1105   ALT 12 05/24/2019 1105   ALKPHOS 90 05/24/2019 1105   BILITOT 0.9 05/24/2019 1105   GFRNONAA 46 (L) 05/24/2019 1105   GFRAA 53 (L) 05/24/2019 1105    Lipid Panel     Component Value Date/Time   CHOL 175 01/06/2018 1109   TRIG 97.0 01/06/2018 1109   HDL 64.30 01/06/2018 1109   CHOLHDL 3 01/06/2018 1109   VLDL 19.4 01/06/2018 1109   LDLCALC 91 01/06/2018 1109     Imaging I have reviewed the images obtained:  CT-scan of the brain-pending  MRI examination of the brain-pending  Etta Quill PA-C [facility] [phone] 05/24/2019, 12:25 PM     Assessment:  73 year old female presenting with sudden onset of loss of vision in her left eye. Had complete vision loss yesterday which has improved to patchy vision loss OS today - she still is having dark spots in her left eye.  1. Dilated retinal exam at Dr. Zenia Resides office today revealed a left CRAO.   2. No history of stroke. States that she does not take an antiplatelet medication at home.  3. Stroke risk factors: Aortic atherosclerosis, PVD, HTN and smoking   Recommend # MRI/MRA of the brain without contrast # Carotid US # Transthoracic Echo  # Start patient on ASA 81 mg daily after a load of 650 mg po x 1.    # Start  atorvastatin 40 mg po qd # BP goal: Given her advanced age, modified permissive HTN up to 200/110 mm Hg # HBAIC and Lipid profile # Telemetry monitoring # Frequent neuro checks # NPO until passes stroke swallow screen # please page stroke NP  Or  PA  Or MD from 8am -4 pm  as this patient from this time will be  followed by the stroke.   You can look them up on [email or web address]  Password [email or web address]  I have interviewed and examined the patient. I have performed the exam, which was observed and documented by Etta Quill, PA. I have formulated the assessment and plan. Electronically signed: Dr. Kerney Elbe

## 2019-05-24 NOTE — ED Triage Notes (Signed)
Pt arrives POV; was sent by opthamologist for vision loss in left eye. Pt reports left eye "blacked out" while watching television last night. Denies any pain. Pt a&o x4. Pt has hx of HTN.

## 2019-05-24 NOTE — ED Notes (Signed)
ED TO INPATIENT HANDOFF REPORT  ED Nurse Name and Phone #:  Percell Locus, RN   S Name/Age/Gender Harold Barban 73 y.o. female Room/Bed: 021C/021C  Code Status   Code Status: Full Code  Home/SNF/Other Home Patient oriented to: self, place, time and situation Is this baseline? Yes   Triage Complete: Triage complete  Chief Complaint stroke sx/sent by dr  Triage Note Pt sent by Ophthalmologist for loss of vision in L eye since last night at 1900. No other neuro deficits  Pt arrives POV; was sent by opthamologist for vision loss in left eye. Pt reports left eye "blacked out" while watching television last night. Denies any pain. Pt a&o x4. Pt has hx of HTN.    Allergies Allergies  Allergen Reactions  . Ace Inhibitors Cough  . Levaquin [Levofloxacin] Other (See Comments)    GI upset    Level of Care/Admitting Diagnosis ED Disposition    ED Disposition Condition Comment   Admit  Hospital Area: [facility] [[id number]]  Level of Care: Telemetry Medical [104]  Covid Evaluation: Screening Protocol (No Symptoms)  Diagnosis: Ischemic optic neuropathy of left eye [782956]  Admitting Physician: Karmen Bongo [[id number]]  Attending Physician: Karmen Bongo [[id number]]  Estimated length of stay: past midnight tomorrow  Certification:: I certify this patient will need inpatient services for at least 2 midnights  PT Class (Do Not Modify): Inpatient [101]  PT Acc Code (Do Not Modify): Private [1]       B Medical/Surgery History Past Medical History:  Diagnosis Date  . 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 Aortic atherosclerosis ([place])   . Arthritis   . Chronic diarrhea   . COPD  GOLD 0 / active smoker  10/24/2015  . Diverticulitis   . Diverticulosis   . Esophageal stricture   . Heart murmur   . Hemorrhoids   . Hyperglycemia 05/01/2013  . Hypertension   . Pneumonia 05/01/2013  . PVD (peripheral vascular  disease) ([place])   . Urine incontinence    Past Surgical History:  Procedure Laterality Date  . ABDOMINAL HYSTERECTOMY    . HEMORRHOID SURGERY N/A 06/26/2015   Procedure: INTERNAL AND EXTERNAL HEMORRHOIDECTOMY;  Surgeon: Georganna Skeans, MD;  Location: [facility];  Service: General;  Laterality: N/A;     A IV Location/Drains/Wounds Patient Lines/Drains/Airways Status   Active Line/Drains/Airways    Name:   Placement date:   Placement time:   Site:   Days:   Peripheral IV 05/24/19 Left Forearm   05/24/19    1209    (S) Forearm   less than 1   Incision (Closed) 06/26/15 Rectum Other (Comment)   06/26/15    1315     1428          Intake/Output Last 24 hours No intake or output data in the 24 hours ending 05/24/19 1334  Labs/Imaging Results for orders placed or performed during the hospital encounter of 05/24/19 (from the past 48 hour(s))  Protime-INR     Status: None   Collection Time: 05/24/19 11:05 AM  Result Value Ref Range   Prothrombin Time 14.2 11.4 - 15.2 seconds   INR 1.1 0.8 - 1.2    Comment: (NOTE) INR goal varies based on device and disease states. Performed at [facility], [place] [address] [place], [place] [address]   APTT     Status: None   Collection Time: 05/24/19 11:05 AM  Result Value Ref Range   aPTT 32 24 - 36 seconds    Comment: Performed at [facility] [address] [place], [place] [address]  CBC     Status: Abnormal   Collection Time: 05/24/19 11:05 AM  Result Value Ref Range   WBC 10.3 4.0 - 10.5 K/uL   RBC 6.48 (H) 3.87 - 5.11 MIL/uL   Hemoglobin 14.9 12.0 - 15.0 g/dL   HCT 49.1 (H) 36.0 - 46.0 %   MCV 75.8 (L) 80.0 - 100.0 fL   MCH 23.0 (L) 26.0 - 34.0 pg   MCHC 30.3 30.0 - 36.0 g/dL   RDW 19.0 (H) 11.5 - 15.5 %   Platelets 409 (H) 150 - 400 K/uL   nRBC 0.0 0.0 - 0.2 %    Comment: Performed at [facility] [address], [place], [place] [address]  Differential     Status: Abnormal   Collection Time:  05/24/19 11:05 AM  Result Value Ref Range   Neutrophils Relative % 79 %   Neutro Abs 8.1 (H) 1.7 - 7.7 K/uL   Lymphocytes Relative 13 %   Lymphs Abs 1.4 0.7 - 4.0 K/uL   Monocytes Relative 6 %   Monocytes Absolute 0.6 0.1 - 1.0 K/uL   Eosinophils Relative 1 %   Eosinophils Absolute 0.1 0.0 - 0.5 K/uL   Basophils Relative 1 %   Basophils Absolute 0.1 0.0 - 0.1 K/uL   Immature Granulocytes 0 %   Abs Immature Granulocytes 0.03 0.00 - 0.07 K/uL    Comment: Performed at [facility] [address] [place], [place] [address]  Comprehensive metabolic panel     Status: Abnormal   Collection Time: 05/24/19 11:05 AM  Result Value Ref Range   Sodium 137 135 - 145 mmol/L   Potassium 3.1 (L) 3.5 - 5.1 mmol/L   Chloride 104 98 - 111 mmol/L   CO2 20 (L) 22 - 32 mmol/L   Glucose, Bld 101 (H) 70 - 99 mg/dL   BUN 10 8 - 23 mg/dL   Creatinine, Ser 1.16 (H) 0.44 - 1.00 mg/dL   Calcium 9.1 8.9 - 10.3 mg/dL   Total Protein 6.8 6.5 - 8.1 g/dL   Albumin 3.5 3.5 - 5.0 g/dL   AST 13 (L) 15 - 41 U/L   ALT 12 0 - 44 U/L   Alkaline Phosphatase 90 38 - 126 U/L   Total Bilirubin 0.9 0.3 - 1.2 mg/dL   GFR calc non Af Amer 46 (L) >60 mL/min   GFR calc Af Amer 53 (L) >60 mL/min   Anion gap 13 5 - 15    Comment: Performed at [facility] [address] [place], [place] [address]  Ethanol     Status: None   Collection Time: 05/24/19 11:16 AM  Result Value Ref Range   Alcohol, Ethyl (B) <10 <10 mg/dL    Comment: (NOTE) Lowest detectable limit for serum alcohol is 10 mg/dL. For medical purposes only. Performed at [facility], [place] [address] [place], [place] [address]   Urine rapid drug screen (hosp performed)     Status: None   Collection Time: 05/24/19 11:40 AM  Result Value Ref Range   Opiates NONE DETECTED NONE DETECTED   Cocaine NONE DETECTED NONE DETECTED   Benzodiazepines NONE DETECTED NONE DETECTED   Amphetamines NONE DETECTED NONE DETECTED   Tetrahydrocannabinol NONE  DETECTED NONE DETECTED   Barbiturates NONE DETECTED NONE  DETECTED    Comment: (NOTE) DRUG SCREEN FOR MEDICAL PURPOSES ONLY.  IF CONFIRMATION IS NEEDED FOR ANY PURPOSE, NOTIFY LAB WITHIN 5 DAYS. LOWEST DETECTABLE LIMITS FOR URINE DRUG SCREEN Drug Class                     Cutoff (ng/mL) Amphetamine and metabolites    1000 Barbiturate and metabolites    200 Benzodiazepine                 578 Tricyclics and metabolites     300 Opiates and metabolites        300 Cocaine and metabolites        300 THC                            50 Performed at [facility], [place] [address] [place] [employer] [place], [place] [address]   Urinalysis, Routine w reflex microscopic     Status: Abnormal   Collection Time: 05/24/19 11:40 AM  Result Value Ref Range   Color, Urine YELLOW YELLOW   APPearance CLEAR CLEAR   Specific Gravity, Urine 1.005 1.005 - 1.030   pH 6.0 5.0 - 8.0   Glucose, UA NEGATIVE NEGATIVE mg/dL   Hgb urine dipstick SMALL (A) NEGATIVE   Bilirubin Urine NEGATIVE NEGATIVE   Ketones, ur NEGATIVE NEGATIVE mg/dL   Protein, ur 100 (A) NEGATIVE mg/dL   Nitrite NEGATIVE NEGATIVE   Leukocytes,Ua NEGATIVE NEGATIVE   RBC / HPF 0-5 0 - 5 RBC/hpf   WBC, UA 0-5 0 - 5 WBC/hpf   Bacteria, UA NONE SEEN NONE SEEN   Squamous Epithelial / LPF 0-5 0 - 5    Comment: Performed at [facility], [place] [address] [place], [place] [address]   No results found.  Pending Labs Unresulted Labs (From admission, onward)    Start     Ordered   05/25/19 0500  Hemoglobin A1c  Tomorrow morning,   R     05/24/19 1330   05/25/19 0500  Lipid panel  Tomorrow morning,   R    Comments:  Fasting    05/24/19 1330   05/24/19 1307  SARS Coronavirus 2 (CEPHEID - Performed in [facility] hospital lab), Hosp Order  (Asymptomatic Patients Labs)  Once,   R    Question:  Rule Out  Answer:  Yes   05/24/19 1306   05/24/19 1225  Sedimentation rate  Once,   R    Question:  Specimen collection method  Answer:  IV Team   05/24/19  1224   05/24/19 1225  C-reactive protein  Once,   R    Question:  Specimen collection method  Answer:  IV Team   05/24/19 1224          Vitals/Pain Today's Vitals   05/24/19 1157 05/24/19 1200 05/24/19 1215 05/24/19 1330  BP:  (!) 163/76 (!) 166/80 (!) 173/67  Pulse:  70 74 75  Resp:  (!) 22 20 (!) 22  Temp:      TempSrc:      SpO2:  94% 96% 97%  Weight:      Height:      PainSc: 0-No pain       Isolation Precautions No active isolations  Medications Medications  aspirin EC tablet 81 mg (has no administration in time range)  zolpidem (AMBIEN) tablet 5 mg (has no administration in time range)  enoxaparin (LOVENOX) injection 40  mg (has no administration in time range)   stroke: mapping our early stages of recovery book (has no administration in time range)  0.9 %  sodium chloride infusion (has no administration in time range)  acetaminophen (TYLENOL) tablet 650 mg (has no administration in time range)    Or  acetaminophen (TYLENOL) solution 650 mg (has no administration in time range)    Or  acetaminophen (TYLENOL) suppository 650 mg (has no administration in time range)  senna-docusate (Senokot-S) tablet 1 tablet (has no administration in time range)  aspirin EC tablet 650 mg (650 mg Oral Given 05/24/19 1308)    Mobility walks Moderate fall risk   Focused Assessments Cardiac Assessment Handoff:  Cardiac Rhythm: Normal sinus rhythm No results found for: CKTOTAL, CKMB, CKMBINDEX, TROPONINI No results found for: DDIMER Does the Patient currently have chest pain? No      R Recommendations: See Admitting Provider Note  Report given to:   Additional Notes:

## 2019-05-24 NOTE — ED Notes (Signed)
NURSE NAVIGATOR  Spoke to pts daughter again and gave her pts room No and phone number to nurses station

## 2019-05-24 NOTE — ED Provider Notes (Signed)
[facility] EMERGENCY DEPARTMENT Provider Note   CSN: [id number] Arrival date & time: 05/24/19  1020    History   Chief Complaint Chief Complaint  Patient presents with  . stroke sxs    sent by provider    HPI Elaine Turner is a 73 y.o. female.     HPI 73 year old female with past medical history as below here from her ophthalmologist for evaluation of retinal artery occlusion.  The patient states that around 6 or 7 last night, she experienced acute onset of complete loss of vision in her left eye.  She had experienced what she describes as "floaters" in both eyes, and now has had persistent decreased vision in the left eye.  Her right eye is normal.  She states she now sees a "blue light" in her left eye.  She went to Dr. Carolynn Sayers of ophthalmology today who sent her here after discovering central retinal artery occlusion on funduscopic exam.  She denies any other focal numbness or weakness.  No loss of balance.  No difficulty swallowing or speaking.  No history of stroke.  No headache.  No known history of arrhythmia.  No other complaints.  Past Medical History:  Diagnosis Date  . 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 Aortic atherosclerosis ([place])   . Arthritis   . Chronic diarrhea   . COPD  GOLD 0 / active smoker  10/24/2015  . Diverticulitis   . Diverticulosis   . Esophageal stricture   . Heart murmur   . Hemorrhoids   . Hyperglycemia 05/01/2013  . Hypertension   . Pneumonia 05/01/2013  . PVD (peripheral vascular disease) ([place])   . Urine incontinence     Patient Active Problem List   Diagnosis Date Noted  . Nasal obstruction 01/06/2019  . Sinus infection 09/16/2018  . Abdominal pain 08/18/2018  . Allergic rhinitis 07/28/2018  . Allergic conjunctivitis 07/28/2018  . Sigmoid diverticulitis 02/15/2018  . Acute respiratory distress 09/30/2017  . AKI (acute kidney injury) ([place])   . COPD with  acute exacerbation ([place])   . Cough 08/09/2017  . Weight loss 06/17/2017  . Constipation 06/17/2017  . Skin tag 03/19/2017  . Abnormal breath sounds 03/25/2016  . COPD  GOLD 0 / active smoker  10/24/2015  . Solitary pulmonary nodule 05/31/2015  . Left knee pain 02/09/2014  . Primary localized osteoarthrosis, lower leg 02/09/2014  . Anemia, unspecified 05/15/2013  . Hypokalemia 05/08/2013  . Hemorrhoids 05/02/2013  . CAP (community acquired pneumonia) 05/01/2013  . Hyponatremia 05/01/2013  . CKD (chronic kidney disease), stage II 05/01/2013  . Hyperglycemia 05/01/2013  . Atherosclerosis of native arteries of the extremities with intermittent claudication 10/14/2012  . Osteoarthritis 09/08/2012  . Cigarette smoker 09/08/2012  . Insomnia 09/08/2012  . Hyperlipidemia 09/08/2012  . Heart murmur, aortic 09/08/2012  . Encounter for well adult exam with abnormal findings 09/02/2012  . Hypertension   . PVD (peripheral vascular disease) ([place])     Past Surgical History:  Procedure Laterality Date  . ABDOMINAL HYSTERECTOMY    . HEMORRHOID SURGERY N/A 06/26/2015   Procedure: INTERNAL AND EXTERNAL HEMORRHOIDECTOMY;  Surgeon: Georganna Skeans, MD;  Location: [facility];  Service: General;  Laterality: N/A;     OB History   No obstetric history on file.      Home Medications    Prior to Admission medications   Medication Sig Start Date End Date Taking? Authorizing Provider  aspirin 81 MG EC tablet Take 1 tablet (81 mg total) by mouth daily. Swallow whole. 03/19/17  Yes Biagio Borg, MD  fluticasone [facility]) 50 MCG/ACT nasal spray Place 2 sprays into both nostrils daily. Patient taking differently: Place 2 sprays into both nostrils as needed for rhinitis.  07/28/18  Yes Biagio Borg, MD  losartan (COZAAR) 25 MG tablet Take 1 tablet (25 mg total) by mouth 2 (two) times daily. 03/16/19  Yes Biagio Borg, MD  NIFEdipine (PROCARDIA XL/NIFEDICAL-XL) 90 MG 24 hr tablet TAKE 1  TABLET (90 MG TOTAL) BY MOUTH DAILY. Patient taking differently: Take 90 mg by mouth daily.  03/16/19  Yes Biagio Borg, MD  zolpidem (AMBIEN) 5 MG tablet Take 1 tablet (5 mg total) by mouth at bedtime as needed for sleep. 11/22/18  Yes Biagio Borg, MD  cetirizine (ZYRTEC) 10 MG tablet Take 1 tablet (10 mg total) by mouth daily as needed for allergies. Patient not taking: Reported on 05/24/2019 07/28/18 07/28/19  Biagio Borg, MD  hydrochlorothiazide (HYDRODIURIL) 25 MG tablet TAKE 1 TABLET (25 MG TOTAL) BY MOUTH DAILY. Patient not taking: Reported on 05/24/2019 04/11/18   Biagio Borg, MD  lactulose (CHRONULAC) 10 GM/15ML solution Take 45 mLs (30 g total) by mouth 2 (two) times daily as needed for mild constipation. Patient not taking: Reported on 05/24/2019 11/11/18   Biagio Borg, MD  nystatin (MYCOSTATIN) 100000 UNIT/ML suspension Take 5 mLs (500,000 Units total) by mouth 4 (four) times daily for 10 days. 05/16/19 05/26/19  Biagio Borg, MD    Family History Family History  Problem Relation Age of Onset  . Hypertension Mother   . Lung cancer Father   . Colon cancer Neg Hx   . Esophageal cancer Neg Hx   . Pancreatic cancer Neg Hx   . Liver disease Neg Hx     Social History Social History   Tobacco Use  . Smoking status: Current Every Day Smoker    Packs/day: 1.00    Years: 60.00    Pack years: 60.00    Types: Cigarettes  . Smokeless tobacco: Never Used  Substance Use Topics  . Alcohol use: Yes    Comment: pt reports drinking 1 pint  per week  . Drug use: No     Allergies   Ace inhibitors and Levaquin [levofloxacin]   Review of Systems Review of Systems  Constitutional: Negative for chills, fatigue and fever.  HENT: Negative for congestion and rhinorrhea.   Eyes: Positive for visual disturbance.  Respiratory: Negative for cough, shortness of breath and wheezing.   Cardiovascular: Negative for chest pain and leg swelling.  Gastrointestinal: Negative for abdominal  pain, diarrhea, nausea and vomiting.  Genitourinary: Negative for dysuria and flank pain.  Musculoskeletal: Negative for neck pain and neck stiffness.  Skin: Negative for rash and wound.  Allergic/Immunologic: Negative for immunocompromised state.  Neurological: Negative for syncope, weakness and headaches.  All other systems reviewed and are negative.    Physical Exam Updated Vital Signs BP (!) 160/75 (BP Location: Right Arm)   Pulse 72   Temp 98.3 F (36.8 C) (Oral)   Resp (!) 22   Ht 5\' 2"  (1.575 m)   Wt 63.5 kg   SpO2 95%   BMI 25.61 kg/m   Physical Exam Vitals signs and nursing note reviewed.  Constitutional:      General: She is not in acute distress.    Appearance: She is well-developed.  HENT:  Head: Normocephalic and atraumatic.  Eyes:     Conjunctiva/sclera: Conjunctivae normal.  Neck:     Musculoskeletal: Neck supple.  Cardiovascular:     Rate and Rhythm: Normal rate and regular rhythm.     Heart sounds: Normal heart sounds. No murmur. No friction rub.  Pulmonary:     Effort: Pulmonary effort is normal. No respiratory distress.     Breath sounds: Normal breath sounds. No wheezing or rales.  Abdominal:     General: There is no distension.     Palpations: Abdomen is soft.     Tenderness: There is no abdominal tenderness.  Skin:    General: Skin is warm.     Capillary Refill: Capillary refill takes less than 2 seconds.  Neurological:     Mental Status: She is alert and oriented to person, place, and time.     Motor: No abnormal muscle tone.     Neurological Exam:  Mental Status: Alert and oriented to person, place, and time. Attention and concentration normal. Speech clear. Recent memory is intact. Cranial Nerves: Markedly diminished vision to finger only on left, normal on right.Marland Kitchen EOMI and PERRLA. No nystagmus noted. Facial sensation intact at forehead, maxillary cheek, and chin/mandible bilaterally. No facial asymmetry or weakness. Hearing grossly  normal. Uvula is midline, and palate elevates symmetrically. Normal SCM and trapezius strength. Tongue midline without fasciculations. Motor: Muscle strength 5/5 in proximal and distal UE and LE bilaterally. No pronator drift. Muscle tone normal. Reflexes: 2+ and symmetrical in all four extremities.  Sensation: Intact to light touch in upper and lower extremities distally bilaterally.  Gait: Normal without ataxia. Coordination: Normal FTN bilaterally.    ED Treatments / Results  Labs (all labs ordered are listed, but only abnormal results are displayed) Labs Reviewed  CBC - Abnormal; Notable for the following components:      Result Value   RBC 6.48 (*)    HCT 49.1 (*)    MCV 75.8 (*)    MCH 23.0 (*)    RDW 19.0 (*)    Platelets 409 (*)    All other components within normal limits  DIFFERENTIAL - Abnormal; Notable for the following components:   Neutro Abs 8.1 (*)    All other components within normal limits  COMPREHENSIVE METABOLIC PANEL - Abnormal; Notable for the following components:   Potassium 3.1 (*)    CO2 20 (*)    Glucose, Bld 101 (*)    Creatinine, Ser 1.16 (*)    AST 13 (*)    GFR calc non Af Amer 46 (*)    GFR calc Af Amer 53 (*)    All other components within normal limits  URINALYSIS, ROUTINE W REFLEX MICROSCOPIC - Abnormal; Notable for the following components:   Hgb urine dipstick SMALL (*)    Protein, ur 100 (*)    All other components within normal limits  ETHANOL  PROTIME-INR  APTT  RAPID URINE DRUG SCREEN, HOSP PERFORMED  SEDIMENTATION RATE  C-REACTIVE PROTEIN    EKG None  Radiology No results found.  Procedures Procedures (including critical care time)  Medications Ordered in ED Medications - No data to display   Initial Impression / Assessment and Plan / ED Course  I have reviewed the triage vital signs and the nursing notes.  Pertinent labs & imaging results that were available during my care of the patient were reviewed by me and  considered in my medical decision making (see chart for details).  73 yo female here with CRAO. No other neuro deficits. EKG shows pVCs, but no AFib. Will consult Neuro, plan to admit for stroke work-up. LVO neg and :LKN >12 hours ago. Not tPA candidate.  Final Clinical Impressions(s) / ED Diagnoses   Final diagnoses:  Central retinal artery occlusion of left eye    ED Discharge Orders    None       Duffy Bruce, MD 05/24/19 1239

## 2019-05-24 NOTE — Evaluation (Signed)
Speech Language Pathology Evaluation Patient Details Name: Elaine Turner MRN: [id number] DOB: [birth year] Today's Date: 05/24/2019 Time: 0623-7628 SLP Time Calculation (min) (ACUTE ONLY): 18 min  Problem List:  Patient Active Problem List   Diagnosis Date Noted  . Ischemic optic neuropathy of left eye 05/24/2019  . Tobacco dependence 05/24/2019  . CKD (chronic kidney disease) stage 3, GFR 30-59 ml/min (HCC) 05/24/2019  . Nasal obstruction 01/06/2019  . Sinus infection 09/16/2018  . Abdominal pain 08/18/2018  . Allergic rhinitis 07/28/2018  . Allergic conjunctivitis 07/28/2018  . Sigmoid diverticulitis 02/15/2018  . Acute respiratory distress 09/30/2017  . AKI (acute kidney injury) ([place])   . COPD with acute exacerbation ([place])   . Cough 08/09/2017  . Weight loss 06/17/2017  . Constipation 06/17/2017  . Skin tag 03/19/2017  . Abnormal breath sounds 03/25/2016  . COPD  GOLD 0 / active smoker  10/24/2015  . Solitary pulmonary nodule 05/31/2015  . Left knee pain 02/09/2014  . Primary localized osteoarthrosis, lower leg 02/09/2014  . Anemia, unspecified 05/15/2013  . Hypokalemia 05/08/2013  . Hemorrhoids 05/02/2013  . CAP (community acquired pneumonia) 05/01/2013  . Hyponatremia 05/01/2013  . CKD (chronic kidney disease), stage II 05/01/2013  . Hyperglycemia 05/01/2013  . Atherosclerosis of native arteries of the extremities with intermittent claudication 10/14/2012  . Osteoarthritis 09/08/2012  . Cigarette smoker 09/08/2012  . Insomnia 09/08/2012  . Hyperlipidemia 09/08/2012  . Heart murmur, aortic 09/08/2012  . Encounter for well adult exam with abnormal findings 09/02/2012  . Hypertension   . PVD (peripheral vascular disease) ([place])    Past Medical History:  Past Medical History:  Diagnosis Date  . 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 Aortic atherosclerosis ([place])   . Arthritis   . Chronic diarrhea   . COPD   GOLD 0 / active smoker  10/24/2015  . Diverticulitis   . Diverticulosis   . Esophageal stricture   . Heart murmur   . Hemorrhoids   . Hyperglycemia 05/01/2013  . Hypertension   . Pneumonia 05/01/2013  . PVD (peripheral vascular disease) ([place])   . Urine incontinence    Past Surgical History:  Past Surgical History:  Procedure Laterality Date  . ABDOMINAL HYSTERECTOMY    . HEMORRHOID SURGERY N/A 06/26/2015   Procedure: INTERNAL AND EXTERNAL HEMORRHOIDECTOMY;  Surgeon: Georganna Skeans, MD;  Location: [facility];  Service: General;  Laterality: N/A;   HPI:  Pt is a 73 y.o. female with medical history significant of PVD, HTN, hyperglycemia, COPD, AIN III of internal hemorrhoids, and aortic atherosclerosis who presented with concern for retinal artery occlusion after being sent in by her ophthalmologist.  MRI was negative for acute infarct.   Assessment / Plan / Recommendation Clinical Impression  Pt reported that she was living independently prior to admission without any deficits in speech, language or cognition and has not observed any changes in these areas with her vision changes. Her speech and language skills are currently within normal limits and no overt cognitive deficits were noted. Further skilled SLP services are not clinically indicated at this time. Pt, and nursing were educated regarding results and recommendations; both parties verbalized understanding as well as agreement with plan of care.    SLP Assessment  SLP Recommendation/Assessment: Patient does not need any further Speech Lanaguage Pathology Services SLP Visit Diagnosis: Aphasia (R47.01)    Follow Up Recommendations  None    Frequency and Duration  SLP Evaluation Cognition  Overall Cognitive Status: Within Functional Limits for tasks assessed Arousal/Alertness: Awake/alert Orientation Level: Oriented X4 Attention: Focused;Sustained Focused Attention: Appears intact Sustained  Attention: Appears intact Memory: Impaired Memory Impairment: Retrieval deficit;Decreased recall of new information(Immediate: 3/3; Dealyed: 2/3; with cue: 1/1) Awareness: Appears intact Problem Solving: Appears intact(5/5) Executive Function: Reasoning Reasoning: Appears intact(3/3)       Comprehension  Auditory Comprehension Overall Auditory Comprehension: Appears within functional limits for tasks assessed Yes/No Questions: Within Functional Limits Basic Biographical Questions: (5/5) Complex Questions: (5/5) Commands: Within Functional Limits Two Step Basic Commands: (4/4) Multistep Basic Commands: (4/4) Conversation: Complex Reading Comprehension Reading Status: Within funtional limits    Expression Expression Primary Mode of Expression: Verbal Verbal Expression Overall Verbal Expression: Appears within functional limits for tasks assessed Initiation: No impairment Automatic Speech: Counting;Day of week;Month of year(WNL) Level of Generative/Spontaneous Verbalization: Conversation Repetition: No impairment(5/5) Naming: No impairment Confrontation: Within functional limits(10/10) Convergent: (Sentence completion: 5/5) Pragmatics: No impairment   Oral / Motor  Oral Motor/Sensory Function Overall Oral Motor/Sensory Function: Within functional limits Motor Speech Overall Motor Speech: Appears within functional limits for tasks assessed Respiration: Within functional limits Phonation: Normal Resonance: Within functional limits Articulation: Within functional limitis Intelligibility: Intelligible Motor Planning: Witnin functional limits Motor Speech Errors: Not applicable   Benecio Kluger I. Hardin Negus, [place], [place] Office number [phone] Pager [phone]                   Horton Marshall 05/24/2019, 5:43 PM

## 2019-05-24 NOTE — ED Notes (Signed)
Patient transported to MRI 

## 2019-05-24 NOTE — ED Notes (Signed)
Dr. Lorin Mercy assessing pt at bedside.

## 2019-05-24 NOTE — Evaluation (Signed)
Physical Therapy Evaluation and Discharge Patient Details Name: Elaine Turner MRN: [id number] DOB: 21-Jan-1946 Today's Date: 05/24/2019   History of Present Illness  Pt is a 73 y/o female admitted following sudden onset L eye vision loss. Thought likely to be secondary to ischemic optic neuropathy in L eye. PMH includes CKD, HTN, tobacco dependence, and COPD.   Clinical Impression  Patient evaluated by Physical Therapy with no further acute PT needs identified. All education has been completed and the patient has no further questions. Pt overall steady with mobility tasks this session. Requiring supervision for gait, and no LOB noted. Pt reporting visual deficits have improved, but tend to fluctuate. She stated she will see spots occasionally, but they come and go. Did not seem to affect mobility. See below for any follow-up Physical Therapy or equipment needs. PT is signing off. Thank you for this referral. If needs change, please re-consult.     Follow Up Recommendations No PT follow up    Equipment Recommendations  None recommended by PT    Recommendations for Other Services       Precautions / Restrictions Precautions Precautions: None Restrictions Weight Bearing Restrictions: No      Mobility  Bed Mobility               General bed mobility comments: In chair upon entry.   Transfers Overall transfer level: Modified independent Equipment used: None                Ambulation/Gait Ambulation/Gait assistance: Supervision Gait Distance (Feet): 200 Feet Assistive device: None Gait Pattern/deviations: WFL(Within Functional Limits) Gait velocity: WFL    General Gait Details: Overall steady gait and pt reports gait is at baseline. Able to perform head turns without LOB.   Stairs            Wheelchair Mobility    Modified Rankin (Stroke Patients Only) Modified Rankin (Stroke Patients Only) Pre-Morbid Rankin Score: No symptoms Modified Rankin: No  significant disability     Balance Overall balance assessment: No apparent balance deficits (not formally assessed)                                           Pertinent Vitals/Pain Pain Assessment: No/denies pain    Home Living Family/patient expects to be discharged to:: Private residence Living Arrangements: Alone Available Help at Discharge: Family;Available PRN/intermittently Type of Home: House Home Access: Level entry     Home Layout: One level Home Equipment: None      Prior Function Level of Independence: Independent               Hand Dominance        Extremity/Trunk Assessment   Upper Extremity Assessment Upper Extremity Assessment: Defer to OT evaluation    Lower Extremity Assessment Lower Extremity Assessment: Overall WFL for tasks assessed    Cervical / Trunk Assessment Cervical / Trunk Assessment: Normal  Communication   Communication: No difficulties  Cognition Arousal/Alertness: Awake/alert Behavior During Therapy: WFL for tasks assessed/performed Overall Cognitive Status: Within Functional Limits for tasks assessed                                        General Comments      Exercises     Assessment/Plan  PT Assessment Patent does not need any further PT services  PT Problem List         PT Treatment Interventions      PT Goals (Current goals can be found in the Care Plan section)  Acute Rehab PT Goals Patient Stated Goal: "to figure out what is going on with my eye" PT Goal Formulation: With patient Time For Goal Achievement: 05/24/19 Potential to Achieve Goals: Good    Frequency     Barriers to discharge        Co-evaluation               AM-PAC PT "6 Clicks" Mobility  Outcome Measure Help needed turning from your back to your side while in a flat bed without using bedrails?: None Help needed moving from lying on your back to sitting on the side of a flat bed without  using bedrails?: None Help needed moving to and from a bed to a chair (including a wheelchair)?: None Help needed standing up from a chair using your arms (e.g., wheelchair or bedside chair)?: None Help needed to walk in hospital room?: None Help needed climbing 3-5 steps with a railing? : A Little 6 Click Score: 23    End of Session   Activity Tolerance: Patient tolerated treatment well Patient left: in chair;with call bell/phone within reach;with chair alarm set Nurse Communication: Mobility status PT Visit Diagnosis: Other symptoms and signs involving the nervous system (R29.898)    Time: 0263-7858 PT Time Calculation (min) (ACUTE ONLY): 12 min   Charges:   PT Evaluation $PT Eval Low Complexity: [place], PT, DPT  Acute Rehabilitation Services  Pager: [phone] Office: [phone]   Rudean Hitt 05/24/2019, 5:43 PM

## 2019-05-24 NOTE — H&P (Addendum)
History and Physical    Elaine JEANMARIE RFF:[id number] DOB: [birth year] DOA: 05/24/2019  PCP: Biagio Borg, MD Consultants:  Katy Fitch - eye; Joetta Manners - ENT Patient coming from:  Home - lives alone; NOK: Daughter  Chief Complaint:  UL L vision loss  HPI: Elaine Turner is a 73 y.o. female with medical history significant of PVD; HTN; hyperglycemia; COPD; AIN III of internal hemorrhoids; and aortic atherosclerosis presenting with concern for retinal artery occlusion after being sent in by her ophthalmologist.  She reports problems with both eyes last night and they said she had a stroke in the left eye.  She noticed problems while watching tv last night about 7pm.  Her eyes "just gave out" - she was having floaters and seeing spots and then she couldn't see anything out of her left eye.  No N/W/T of arms/legs, no dysarthria, dysphagia.  No h/o CVA.  The vision is blurred in that eye now, but her vision is fine on the right.   ED Course:   Central retinal artery occlusion - started last night while watching TV.  Saw Dr. Katy Fitch today and was sent here.  Needs CVA evaluation.  Inflammatory markers added for temporal arteritis.  Review of Systems: As per HPI; otherwise review of systems reviewed and negative.   Ambulatory Status:  Ambulates without assistance  Past Medical History:  Diagnosis Date  . 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 Aortic atherosclerosis ([place])   . Arthritis   . Chronic diarrhea   . COPD  GOLD 0 / active smoker  10/24/2015  . Diverticulitis   . Diverticulosis   . Esophageal stricture   . Heart murmur   . Hemorrhoids   . Hyperglycemia 05/01/2013  . Hypertension   . Pneumonia 05/01/2013  . PVD (peripheral vascular disease) ([place])   . Urine incontinence     Past Surgical History:  Procedure Laterality Date  . ABDOMINAL HYSTERECTOMY    . HEMORRHOID SURGERY N/A 06/26/2015   Procedure: INTERNAL AND  EXTERNAL HEMORRHOIDECTOMY;  Surgeon: Georganna Skeans, MD;  Location: [facility];  Service: General;  Laterality: N/A;    Social History   Socioeconomic History  . Marital status: Married    Spouse name: Not on file  . Number of children: Not on file  . Years of education: 24  . Highest education level: Not on file  Occupational History  . Occupation: retired  Scientific laboratory technician  . Financial resource strain: Not on file  . Food insecurity:    Worry: Not on file    Inability: Not on file  . Transportation needs:    Medical: Not on file    Non-medical: Not on file  Tobacco Use  . Smoking status: Current Every Day Smoker    Packs/day: 1.00    Years: 62.00    Pack years: 62.00    Types: Cigarettes  . Smokeless tobacco: Never Used  Substance and Sexual Activity  . Alcohol use: Yes    Comment: pt reports drinking 1 pint  per week  . Drug use: No  . Sexual activity: Never  Lifestyle  . Physical activity:    Days per week: Not on file    Minutes per session: Not on file  . Stress: Not on file  Relationships  . Social connections:    Talks on phone: Not on file    Gets together: Not on file  Attends religious service: Not on file    Active member of club or organization: Not on file    Attends meetings of clubs or organizations: Not on file    Relationship status: Not on file  . Intimate partner violence:    Fear of current or ex partner: Not on file    Emotionally abused: Not on file    Physically abused: Not on file    Forced sexual activity: Not on file  Other Topics Concern  . Not on file  Social History Narrative  . Not on file    Allergies  Allergen Reactions  . Ace Inhibitors Cough  . Levaquin [Levofloxacin] Other (See Comments)    GI upset    Family History  Problem Relation Age of Onset  . Hypertension Mother   . Lung cancer Father   . Colon cancer Neg Hx   . Esophageal cancer Neg Hx   . Pancreatic cancer Neg Hx   . Liver disease Neg Hx    . Stroke Neg Hx     Prior to Admission medications   Medication Sig Start Date End Date Taking? Authorizing Provider  aspirin 81 MG EC tablet Take 1 tablet (81 mg total) by mouth daily. Swallow whole. 03/19/17  Yes Biagio Borg, MD  fluticasone [facility]) 50 MCG/ACT nasal spray Place 2 sprays into both nostrils daily. Patient taking differently: Place 2 sprays into both nostrils as needed for rhinitis.  07/28/18  Yes Biagio Borg, MD  losartan (COZAAR) 25 MG tablet Take 1 tablet (25 mg total) by mouth 2 (two) times daily. 03/16/19  Yes Biagio Borg, MD  NIFEdipine (PROCARDIA XL/NIFEDICAL-XL) 90 MG 24 hr tablet TAKE 1 TABLET (90 MG TOTAL) BY MOUTH DAILY. Patient taking differently: Take 90 mg by mouth daily.  03/16/19  Yes Biagio Borg, MD  zolpidem (AMBIEN) 5 MG tablet Take 1 tablet (5 mg total) by mouth at bedtime as needed for sleep. 11/22/18  Yes Biagio Borg, MD  cetirizine (ZYRTEC) 10 MG tablet Take 1 tablet (10 mg total) by mouth daily as needed for allergies. Patient not taking: Reported on 05/24/2019 07/28/18 07/28/19  Biagio Borg, MD  hydrochlorothiazide (HYDRODIURIL) 25 MG tablet TAKE 1 TABLET (25 MG TOTAL) BY MOUTH DAILY. Patient not taking: Reported on 05/24/2019 04/11/18   Biagio Borg, MD  lactulose (CHRONULAC) 10 GM/15ML solution Take 45 mLs (30 g total) by mouth 2 (two) times daily as needed for mild constipation. Patient not taking: Reported on 05/24/2019 11/11/18   Biagio Borg, MD  nystatin (MYCOSTATIN) 100000 UNIT/ML suspension Take 5 mLs (500,000 Units total) by mouth 4 (four) times daily for 10 days. 05/16/19 05/26/19  Biagio Borg, MD    Physical Exam: Vitals:   05/24/19 1115 05/24/19 1154 05/24/19 1200 05/24/19 1215  BP: (!) 159/96 (!) 160/75 (!) 163/76 (!) 166/80  Pulse: 77 72 70 74  Resp: (!) 23 (!) 22 (!) 22 20  Temp:  98.3 F (36.8 C)    TempSrc:  Oral    SpO2: 93% 95% 94% 96%  Weight:      Height:         . General:  Appears calm and comfortable and is  NAD . Eyes:  PERRL (but still dilated), EOMI, normal lids, iris . ENT:  grossly normal hearing, lips & tongue, mmm; appropriate dentition . Neck:  no LAD, masses or thyromegaly; L carotid bruit . Cardiovascular:  Irregularly irregular but rate controlled, no m/Turner/g.  No LE edema.  Marland Kitchen Respiratory:   CTA bilaterally with no wheezes/rales/rhonchi.  Normal respiratory effort. . Abdomen:  soft, NT, ND, NABS . Skin:  no rash or induration seen on limited exam . Musculoskeletal:  grossly normal tone BUE/BLE, good ROM, no bony abnormality . Psychiatric:  grossly normal mood and affect, speech fluent and appropriate, AOx3 . Neurologic:  CN 2-12 grossly intact, moves all extremities in coordinated fashion, sensation intact    Radiological Exams on Admission: No results found.  EKG: Independently reviewed.  NSR with rate 75; nonspecific ST changes with no evidence of acute ischemia   Labs on Admission: I have personally reviewed the available labs and imaging studies at the time of the admission.  Pertinent labs:   K+ 3.1 CO2 20 Glucose 101 BUN 10/Creatinine 1.16/GFR 53 - better than usual baseline WBC 10.3 INR 1.1 UA: small hgb, 100 protein ETOH <10 UDS negative  Assessment/Plan Principal Problem:   Ischemic optic neuropathy of left eye Active Problems:   Hypertension   Hyperlipidemia   Tobacco dependence   CKD (chronic kidney disease) stage 3, GFR 30-59 ml/min (HCC)   Left Central Retinal Artery Occlusion -Patient with acute onset of L vision loss last night -Ophthalmology exam today confirms L central retinal artery occlusion -Patient needs additional CVA evaluation -Will admit -Telemetry monitoring -MRI/MRA -Carotid dopplers; L--sided bruit was appreciated and if significant disease is present she may require vascular surgery evaluation -Echo -Risk stratification with FLP, A1c; will also check TSH and UDS -ASA 325 mg PO daily -Neurology consult -PT/OT/ST/Nutrition  Consults  HTN -Allow permissive HTN for now -Treat BP only if >220/120, and then with goal of 15% reduction -Hold AR, CCB and plan to restart in 48-72 hours   HLD -Check FLP -Will empirically start Lipitor 40 mg daily  Atrial fibrillation -She reports h/o irregular heart rate but is not familiar with the term "afib" -Her HR is currently irregular but is in sinus rhythm -Will continue to monitor on telemetry, but I am suspicious for PAF -If PAF is noted, she should be started on [facility]   CKD -Appears to be stable and actually better than usual baseline -Will follow  Tobacco dependence -Encourage cessation.   -This was discussed with the patient and should be reviewed on an ongoing basis.   -Patch ordered at patient request.      Note: This patient has been tested and is pending for the novel coronavirus COVID-19.   DVT prophylaxis:  Lovenox  Code Status: Full - confirmed with patient Family Communication: None present.  She did not request that I call to discuss her care with her daughter. Disposition Plan:  Home once clinically improved Consults called: Neurology; PT/OT/ST/Nutrition  Admission status: Admit - It is my clinical opinion that admission to INPATIENT is reasonable and necessary because of the expectation that this patient will require hospital care that crosses at least 2 midnights to treat this condition based on the medical complexity of the problems presented.  Given the aforementioned information, the predictability of an adverse outcome is felt to be significant.     Karmen Bongo MD [facility]   How to contact the [facility] Attending or Consulting provider [place] or covering provider during after hours [place], for this patient?  1. Check the care team in [facility] and look for a) attending/consulting TRH provider listed and b) the [facility] team listed 2. Log into [email or web address] and use [place]'s universal password to access. If you do  not have the password,  please contact the hospital operator. 3. Locate the [facility] provider you are looking for under [facility] and page to a number that you can be directly reached. 4. If you still have difficulty reaching the provider, please page the [facility] (Director on Call) for the Hospitalists listed on amion for assistance.   05/24/2019, 1:34 PM

## 2019-05-25 ENCOUNTER — Inpatient Hospital Stay (HOSPITAL_COMMUNITY): Payer: Medicare Other

## 2019-05-25 ENCOUNTER — Encounter (HOSPITAL_COMMUNITY)
Admission: EM | Disposition: A | Discharge status: Home / Self Care | Payer: Self-pay | Source: Home / Self Care | Attending: Emergency Medicine

## 2019-05-25 ENCOUNTER — Encounter (HOSPITAL_COMMUNITY): Payer: Self-pay | Admitting: General Practice

## 2019-05-25 DIAGNOSIS — I160 Hypertensive urgency: Secondary | ICD-10-CM

## 2019-05-25 DIAGNOSIS — H3410 Central retinal artery occlusion, unspecified eye: Secondary | ICD-10-CM | POA: Diagnosis present

## 2019-05-25 DIAGNOSIS — I6389 Other cerebral infarction: Secondary | ICD-10-CM | POA: Diagnosis not present

## 2019-05-25 DIAGNOSIS — E785 Hyperlipidemia, unspecified: Secondary | ICD-10-CM

## 2019-05-25 DIAGNOSIS — N183 Chronic kidney disease, stage 3 (moderate): Secondary | ICD-10-CM

## 2019-05-25 DIAGNOSIS — I10 Essential (primary) hypertension: Secondary | ICD-10-CM | POA: Diagnosis not present

## 2019-05-25 DIAGNOSIS — F17200 Nicotine dependence, unspecified, uncomplicated: Secondary | ICD-10-CM

## 2019-05-25 DIAGNOSIS — H47012 Ischemic optic neuropathy, left eye: Secondary | ICD-10-CM

## 2019-05-25 HISTORY — PX: LOOP RECORDER INSERTION: EP1214

## 2019-05-25 HISTORY — DX: Ischemic optic neuropathy, left eye: H47.012

## 2019-05-25 LAB — BASIC METABOLIC PANEL
Anion gap: 13 (ref 5–15)
BUN: 10 mg/dL (ref 8–23)
CO2: 24 mmol/L (ref 22–32)
Calcium: 9.1 mg/dL (ref 8.9–10.3)
Chloride: 105 mmol/L (ref 98–111)
Creatinine, Ser: 1.11 mg/dL — ABNORMAL HIGH (ref 0.44–1.00)
GFR calc Af Amer: 56 mL/min — ABNORMAL LOW (ref >60)
GFR calc non Af Amer: 49 mL/min — ABNORMAL LOW (ref >60)
Glucose, Bld: 79 mg/dL (ref 70–99)
Potassium: 3.1 mmol/L — ABNORMAL LOW (ref 3.5–5.1)
Sodium: 142 mmol/L (ref 135–145)

## 2019-05-25 LAB — LIPID PANEL
Cholesterol: 144 mg/dL (ref 0–200)
HDL: 52 mg/dL (ref >40)
LDL Cholesterol: 78 mg/dL (ref 0–99)
Total CHOL/HDL Ratio: 2.8 RATIO
Triglycerides: 69 mg/dL (ref <150)
VLDL: 14 mg/dL (ref 0–40)

## 2019-05-25 LAB — HEMOGLOBIN A1C
Hgb A1c MFr Bld: 5.5 % (ref 4.8–5.6)
Mean Plasma Glucose: 111.15 mg/dL

## 2019-05-25 SURGERY — LOOP RECORDER INSERTION

## 2019-05-25 MED ORDER — ENSURE ENLIVE PO LIQD
237.0000 mL | Freq: Every day | ORAL | Status: DC
  Administered 2019-05-25: 17:00:00 237 mL via ORAL

## 2019-05-25 MED ORDER — LIDOCAINE-EPINEPHRINE 1 %-1:100000 IJ SOLN
INTRAMUSCULAR | Status: DC | PRN
  Administered 2019-05-25: 20 mL

## 2019-05-25 MED ORDER — LIDOCAINE-EPINEPHRINE 1 %-1:100000 IJ SOLN
INTRAMUSCULAR | Status: AC
  Filled 2019-05-25: qty 1

## 2019-05-25 MED ORDER — CLOPIDOGREL BISULFATE 75 MG PO TABS
75.0000 mg | ORAL_TABLET | Freq: Every day | ORAL | Status: DC
  Administered 2019-05-25: 75 mg via ORAL
  Filled 2019-05-25: qty 1

## 2019-05-25 MED ORDER — ENSURE ENLIVE PO LIQD: 237.0000 mL | Freq: Every day | ORAL | 12 refills | Status: DC

## 2019-05-25 MED ORDER — ATORVASTATIN CALCIUM 40 MG PO TABS: 40.0000 mg | ORAL_TABLET | Freq: Every day | ORAL | 0 refills | Status: DC

## 2019-05-25 MED ORDER — NICOTINE 14 MG/24HR TD PT24: 14.0000 mg | MEDICATED_PATCH | Freq: Every day | TRANSDERMAL | 0 refills | Status: DC

## 2019-05-25 MED ORDER — CLOPIDOGREL BISULFATE 75 MG PO TABS: 75.0000 mg | ORAL_TABLET | Freq: Every day | ORAL | 0 refills | Status: DC

## 2019-05-25 SURGICAL SUPPLY — 2 items
LOOP REVEAL LINQSYS (Prosthesis & Implant Heart) ×2 IMPLANT
PACK LOOP INSERTION (CUSTOM PROCEDURE TRAY) ×2 IMPLANT

## 2019-05-25 NOTE — Plan of Care (Signed)
Progressing towards goal

## 2019-05-25 NOTE — Social Work (Signed)
CSW acknowledging consult for SNF placement. Therapy currently recommending no follow up and pt is ambulating at supervision level.   Westley Hummer, MSW, [place] Work [phone]

## 2019-05-25 NOTE — Care Management CC44 (Signed)
Condition Code 44 Documentation Completed  Patient Details  Name: Elaine Turner MRN: [id number] Date of Birth: [birth year]   Condition Code 44 given:  Yes Patient signature on Condition Code 44 notice:  Yes Documentation of 2 MD's agreement:  Yes Code 44 added to claim:  Yes    Pollie Friar, RN 05/25/2019, 5:06 PM

## 2019-05-25 NOTE — Care Management Obs Status (Signed)
[facility] NOTIFICATION   Patient Details  Name: Elaine Turner MRN: [id number] Date of Birth: [birth year]   Medicare Observation Status Notification Given:  Yes    Pollie Friar, RN 05/25/2019, 5:06 PM

## 2019-05-25 NOTE — TOC Initial Note (Signed)
Transition of Care [facility]) - Initial/Assessment Note    Patient Details  Name: Elaine Turner MRN: [id number] Date of Birth: [birth year]  Transition of Care [facility]) CM/SW Contact:    Pollie Friar, RN Phone Number: 05/25/2019, 3:29 PM  Clinical Narrative:                 Pt denies issues with transportation or her home meds. Daughter is able to assist at home if needed.  Pt has transportation home when medically ready.  Expected Discharge Plan: Home/Self Care Barriers to Discharge: Continued Medical Work up   Patient Goals and CMS Choice Patient states their goals for this hospitalization and ongoing recovery are:: to get back home      Expected Discharge Plan and Services Expected Discharge Plan: Home/Self Care       Living arrangements for the past 2 months: Single Family Home(one level)                                      Prior Living Arrangements/Services Living arrangements for the past 2 months: Single Family Home(one level) Lives with:: Self Patient language and need for interpreter reviewed:: Yes(no needs) Do you feel safe going back to the place where you live?: Yes      Need for Family Participation in Patient Care: No (Comment)     Criminal Activity/Legal Involvement Pertinent to Current Situation/Hospitalization: No - Comment as needed  Activities of Daily Living Home Assistive Devices/Equipment: Eyeglasses ADL Screening (condition at time of admission) Patient's cognitive ability adequate to safely complete daily activities?: Yes Is the patient deaf or have difficulty hearing?: No Does the patient have difficulty seeing, even when wearing glasses/contacts?: No Does the patient have difficulty concentrating, remembering, or making decisions?: No Patient able to express need for assistance with ADLs?: Yes Does the patient have difficulty dressing or bathing?: No Independently performs ADLs?: Yes (appropriate for developmental age) Does the  patient have difficulty walking or climbing stairs?: No Weakness of Legs: None Weakness of Arms/Hands: None  Permission Sought/Granted                  Emotional Assessment Appearance:: Appears stated age Attitude/Demeanor/Rapport: Engaged Affect (typically observed): Accepting, Appropriate, Pleasant Orientation: : Oriented to Self, Oriented to Place, Oriented to  Time, Oriented to Situation   Psych Involvement: No (comment)  Admission diagnosis:  Central retinal artery occlusion of left eye [H34.12] Patient Active Problem List   Diagnosis Date Noted  . Ischemic optic neuropathy of left eye 05/24/2019  . Tobacco dependence 05/24/2019  . CKD (chronic kidney disease) stage 3, GFR 30-59 ml/min (HCC) 05/24/2019  . Nasal obstruction 01/06/2019  . Sinus infection 09/16/2018  . Abdominal pain 08/18/2018  . Allergic rhinitis 07/28/2018  . Allergic conjunctivitis 07/28/2018  . Sigmoid diverticulitis 02/15/2018  . Acute respiratory distress 09/30/2017  . AKI (acute kidney injury) ([place])   . COPD with acute exacerbation ([place])   . Cough 08/09/2017  . Weight loss 06/17/2017  . Constipation 06/17/2017  . Skin tag 03/19/2017  . Abnormal breath sounds 03/25/2016  . COPD  GOLD 0 / active smoker  10/24/2015  . Solitary pulmonary nodule 05/31/2015  . Left knee pain 02/09/2014  . Primary localized osteoarthrosis, lower leg 02/09/2014  . Anemia, unspecified 05/15/2013  . Hypokalemia 05/08/2013  . Hemorrhoids 05/02/2013  . CAP (community acquired pneumonia) 05/01/2013  . Hyponatremia 05/01/2013  . CKD (  chronic kidney disease), stage II 05/01/2013  . Hyperglycemia 05/01/2013  . Atherosclerosis of native arteries of the extremities with intermittent claudication 10/14/2012  . Osteoarthritis 09/08/2012  . Cigarette smoker 09/08/2012  . Insomnia 09/08/2012  . Hyperlipidemia 09/08/2012  . Heart murmur, aortic 09/08/2012  . Encounter for well adult exam with abnormal findings 09/02/2012   . Hypertension   . PVD (peripheral vascular disease) ([place])    PCP:  Biagio Borg, MD Pharmacy:   [id number] [address], [place] [address] Phone: [phone] Fax: [phone]     Social Determinants of Health (SDOH) Interventions    Readmission Risk Interventions No flowsheet data found.

## 2019-05-25 NOTE — Progress Notes (Signed)
Initial Nutrition Assessment   RD working remotely.  DOCUMENTATION CODES:   Not applicable  INTERVENTION:  Provide Ensure Enlive po once daily, each supplement provides 350 kcal and 20 grams of protein.  Encourage adequate PO intake.   NUTRITION DIAGNOSIS:   Increased nutrient needs related to chronic illness(COPD) as evidenced by estimated needs.  GOAL:   Patient will meet greater than or equal to 90% of their needs  MONITOR:   PO intake, Supplement acceptance, Labs, Weight trends, I & O's, Skin  REASON FOR ASSESSMENT:   Consult Assessment of nutrition requirement/status(CVA)  ASSESSMENT:   73 y.o. female with medical history significant of PVD; HTN; hyperglycemia; COPD; AIN III of internal hemorrhoids; and aortic atherosclerosis presenting with concern for retinal artery occlusion. MRI brain negative for acute infarct with moderate chronic microvascular ischemic change in the white matter.   RD contacted pt via inpatient room phone. Pt reports appetite is fine currently and PTA with usual intake of at least 2 meals a day with multiple snacks throughout the day. Current meal completion has been 60-100%. Pt with no significant weight loss per weight records. RD to order nutritional supplement to ensure adequate caloric and protein needs are met.   Unable to complete Nutrition-Focused physical exam at this time.   Labs and medications reviewed.   Diet Order:   Diet Order            Diet Heart Room service appropriate? Yes; Fluid consistency: Thin  Diet effective ____              EDUCATION NEEDS:   Not appropriate for education at this time  Skin:  Skin Assessment: Reviewed RN Assessment  Last BM:  5/27  Height:   Ht Readings from Last 1 Encounters:  05/24/19 _0  (1.575 m)    Weight:   Wt Readings from Last 1 Encounters:  05/24/19 63.5 kg    Ideal Body Weight:  50 kg  BMI:  Body mass index is 25.61 kg/m.  Estimated Nutritional Needs:    Kcal:  1700-1850  Protein:  75-85 grams  Fluid:  1.7 - 1.9 L/day    Corrin Parker, MS, RD, LDN Pager # [phone] After hours/ weekend pager # [phone]

## 2019-05-25 NOTE — Discharge Instructions (Signed)
Implant site/wound care instructions °Keep incision clean and dry for 3 days. °You can remove outer dressing tomorrow. °Leave steri-strips (little pieces of tape) on until seen in the office for wound check appointment. °Call the office ([phone]) for redness, drainage, swelling, or fever. ° °

## 2019-05-25 NOTE — Progress Notes (Signed)
PROGRESS NOTE    [name]  NOM:[id number] DOB: 1946-08-27 DOA: 05/24/2019 PCP: Biagio Borg, MD   Brief Narrative:  HPI On 05/24/2019 by Dr. Karmen Bongo Elaine Turner is a 73 y.o. female with medical history significant of PVD; HTN; hyperglycemia; COPD; AIN III of internal hemorrhoids; and aortic atherosclerosis presenting with concern for retinal artery occlusion after being sent in by her ophthalmologist.  She reports problems with both eyes last night and they said she had a stroke in the left eye.  She noticed problems while watching tv last night about 7pm.  Her eyes "just gave out" - she was having floaters and seeing spots and then she couldn't see anything out of her left eye.  No N/W/T of arms/legs, no dysarthria, dysphagia.  No h/o CVA.  The vision is blurred in that eye now, but her vision is fine on the right.  Assessment & Plan   Left Central Retinal Artery Occlusion -Patient with acute onset of L vision loss- currently improving some -Sent from Dr. Katy Fitch, ophthalmology  -MRI brain negative for acute infarct.  Moderate chronic microvascular ischemic change in the white matter. -MRA: Atherosclerotic irregularity and mild stenosis of the cavernous carotid bilaterally.  No large vessel occlusion -Neurology consulted and appreciated, pending recommendations -LDL 78, hemoglobin A1c 5.5 -Carotid Doppler showed bilateral ICA 1 to 39% stenosis.  Bilateral vertebral arteries demonstrate antegrade flow. -Echocardiogram showed an EF 60 to 65%.  LV diastolic Doppler parameters consistent with impaired relaxation.  Mobile echodensity noted on the RA side of the interatrial septum, 1.28 cm x 0.611 cm.  No ASD or PFO. -Cardiology consulted for TEE -PT/OT evaluated patient, no further therapy recommended -Placed on Plavix, aspirin, statin  Essential Hypertension -Initially allowing for permissive hypertension given possible CVA -Home medications held  Hyperlipidemia -Lipid  panel: Total cholesterol 144, HDL 52, LDL 78, triglycerides 69 -Placed on statin empirically  Atrial fibrillation -She reports h/o irregular heart rate but is not familiar with the term "afib" -Not sure why she is not on anticoagulation   Chronic kidney disease, stage III -Creatinine currently appears to be better than baseline -continue to monitor BMP  Tobacco dependence -discussed smoking cessation -continue nicotine patch  Hypokalemia -Replaced, continue to monitor BMP  DVT Prophylaxis  Lovenox  Code Status: Full  Family Communication: None at bedside  Disposition Plan: Admitted. Pending further neurology recommendations and workup.  Consultants Neurology Cardiology  Procedures  Echocardiogram Carotid doppler  Antibiotics   Anti-infectives (From admission, onward)   None      Subjective:   Elaine Turner seen and examined today.  Feels vision is improved but continues to have episodes of blurry vision with squiggly floating things.  Patient denies current chest pain, shortness breath, abdominal pain, nausea or vomiting, diarrhea constipation, dizziness or headache.    Objective:   Vitals:   05/25/19 0024 05/25/19 0359 05/25/19 0800 05/25/19 1300  BP: (!) 141/87 (!) 142/55 (!) 153/72 126/83  Pulse: 67 67 71 72  Resp: 16 (!) 22 17 (!) 21  Temp: 98 F (36.7 C) 97.7 F (36.5 C) 98.2 F (36.8 C) 98.4 F (36.9 C)  TempSrc: Oral Oral Oral Oral  SpO2: 94% 96% 98% 98%  Weight:      Height:        Intake/Output Summary (Last 24 hours) at 05/25/2019 1357 Last data filed at 05/25/2019 1300 Gross per 24 hour  Intake 1480.62 ml  Output -  Net 1480.62 ml   Danley Danker  Weights   05/24/19 1032  Weight: 63.5 kg    Exam  General: Well developed, well nourished, NAD, appears stated age  HEENT: NCAT, PERRLA, EOMI, Anicteic Sclera, mucous membranes moist.   Neck: Supple  Cardiovascular: S1 S2 auscultated, RRR  Respiratory: Clear to auscultation bilaterally    Abdomen: Soft, nontender, nondistended, + bowel sounds  Extremities: warm dry without cyanosis clubbing or edema  Neuro: AAOx3, nonfocal  Psych: Normal affect and demeanor with intact judgement and insight   Data Reviewed: I have personally reviewed following labs and imaging studies  CBC: Recent Labs  Lab 05/24/19 1105  WBC 10.3  NEUTROABS 8.1*  HGB 14.9  HCT 49.1*  MCV 75.8*  PLT 585*   Basic Metabolic Panel: Recent Labs  Lab 05/24/19 1105 05/25/19 0401  NA 137 142  K 3.1* 3.1*  CL 104 105  CO2 20* 24  GLUCOSE 101* 79  BUN 10 10  CREATININE 1.16* 1.11*  CALCIUM 9.1 9.1   GFR: Estimated Creatinine Clearance: 38.4 mL/min (A) (by C-G formula based on SCr of 1.11 mg/dL (H)). Liver Function Tests: Recent Labs  Lab 05/24/19 1105  AST 13*  ALT 12  ALKPHOS 90  BILITOT 0.9  PROT 6.8  ALBUMIN 3.5   No results for input(s): LIPASE, AMYLASE in the last 168 hours. No results for input(s): AMMONIA in the last 168 hours. Coagulation Profile: Recent Labs  Lab 05/24/19 1105  INR 1.1   Cardiac Enzymes: No results for input(s): CKTOTAL, CKMB, CKMBINDEX, TROPONINI in the last 168 hours. BNP (last 3 results) No results for input(s): PROBNP in the last 8760 hours. HbA1C: Recent Labs    05/25/19 0401  HGBA1C 5.5   CBG: No results for input(s): GLUCAP in the last 168 hours. Lipid Profile: Recent Labs    05/25/19 0401  CHOL 144  HDL 52  LDLCALC 78  TRIG 69  CHOLHDL 2.8   Thyroid Function Tests: Recent Labs    05/24/19 1622  TSH 0.337*   Anemia Panel: No results for input(s): VITAMINB12, FOLATE, FERRITIN, TIBC, IRON, RETICCTPCT in the last 72 hours. Urine analysis:    Component Value Date/Time   COLORURINE YELLOW 05/24/2019 1140   APPEARANCEUR CLEAR 05/24/2019 1140   LABSPEC 1.005 05/24/2019 1140   PHURINE 6.0 05/24/2019 1140   GLUCOSEU NEGATIVE 05/24/2019 1140   GLUCOSEU NEGATIVE 08/18/2018 1446   HGBUR SMALL (A) 05/24/2019 1140    BILIRUBINUR NEGATIVE 05/24/2019 1140   KETONESUR NEGATIVE 05/24/2019 1140   PROTEINUR 100 (A) 05/24/2019 1140   UROBILINOGEN 0.2 08/18/2018 1446   NITRITE NEGATIVE 05/24/2019 1140   LEUKOCYTESUR NEGATIVE 05/24/2019 1140   Sepsis Labs: @LABRCNTIP (procalcitonin:4,lacticidven:4)  ) Recent Results (from the past 240 hour(s))  SARS Coronavirus 2 (CEPHEID - Performed in [place] hospital lab), Hosp Order     Status: None   Collection Time: 05/24/19  1:36 PM  Result Value Ref Range Status   SARS Coronavirus 2 NEGATIVE NEGATIVE Final    Comment: (NOTE) If result is NEGATIVE SARS-CoV-2 target nucleic acids are NOT DETECTED. The SARS-CoV-2 RNA is generally detectable in upper and lower  respiratory specimens during the acute phase of infection. The lowest  concentration of SARS-CoV-2 viral copies this assay can detect is 250  copies / mL. A negative result does not preclude SARS-CoV-2 infection  and should not be used as the sole basis for treatment or other  patient management decisions.  A negative result may occur with  improper specimen collection / handling, submission of specimen  other  than nasopharyngeal swab, presence of viral mutation(s) within the  areas targeted by this assay, and inadequate number of viral copies  (<250 copies / mL). A negative result must be combined with clinical  observations, patient history, and epidemiological information. If result is POSITIVE SARS-CoV-2 target nucleic acids are DETECTED. The SARS-CoV-2 RNA is generally detectable in upper and lower  respiratory specimens dur ing the acute phase of infection.  Positive  results are indicative of active infection with SARS-CoV-2.  Clinical  correlation with patient history and other diagnostic information is  necessary to determine patient infection status.  Positive results do  not rule out bacterial infection or co-infection with other viruses. If result is PRESUMPTIVE POSTIVE SARS-CoV-2 nucleic  acids MAY BE PRESENT.   A presumptive positive result was obtained on the submitted specimen  and confirmed on repeat testing.  While 2019 novel coronavirus  (SARS-CoV-2) nucleic acids may be present in the submitted sample  additional confirmatory testing may be necessary for epidemiological  and / or clinical management purposes  to differentiate between  SARS-CoV-2 and other Sarbecovirus currently known to infect humans.  If clinically indicated additional testing with an alternate test  methodology [id number]) is advised. The SARS-CoV-2 RNA is generally  detectable in upper and lower respiratory sp ecimens during the acute  phase of infection. The expected result is Negative. Fact Sheet for Patients:  StrictlyIdeas.no Fact Sheet for Healthcare Providers: BankingDealers.co.za This test is not yet approved or cleared by the [place] FDA and has been authorized for detection and/or diagnosis of SARS-CoV-2 by FDA under an Emergency Use Authorization (EUA).  This EUA will remain in effect (meaning this test can be used) for the duration of the COVID-19 declaration under Section 564(b)(1) of the Act, 21 U.S.C. section 360bbb-3(b)(1), unless the authorization is terminated or revoked sooner. Performed at [facility], [place] [address], [place], [place] [address]       Radiology Studies: Mr Virgel Paling YF Contrast  Result Date: 05/24/2019 CLINICAL DATA:  Stroke. Retinal artery occlusion. Left eye vision problem. EXAM: MRI HEAD WITHOUT CONTRAST MRA HEAD WITHOUT CONTRAST TECHNIQUE: Multiplanar, multiecho pulse sequences of the brain and surrounding structures were obtained without intravenous contrast. Angiographic images of the head were obtained using MRA technique without contrast. COMPARISON:  None. FINDINGS: MRI HEAD FINDINGS Brain: Negative for acute infarct. Ventricle size and cerebral volume normal. Moderate chronic microvascular  ischemic change in the white matter and pons bilaterally. Negative for hemorrhage or mass. Vascular: Normal arterial flow voids Skull and upper cervical spine: No acute skeletal abnormality. Relatively large disc protrusion on the left at C3-4 with spinal stenosis. Smaller central disc protrusion C4-5. Limited evaluation. Sinuses/Orbits: Mucosal edema paranasal sinuses. Bilateral cataract surgery. Other: None MRA HEAD FINDINGS Both vertebral arteries patent to the basilar without stenosis. PICA patent bilaterally. Basilar widely patent. Superior cerebellar and posterior cerebral arteries patent bilaterally. Atherosclerotic irregularity in the cavernous carotid bilaterally with mild stenosis bilaterally. Anterior and middle cerebral arteries patent bilaterally without significant stenosis. IMPRESSION: 1. Negative for acute infarct. Moderate chronic microvascular ischemic change in the white matter 2. Atherosclerotic irregularity and mild stenosis in the cavernous carotid bilaterally. No large vessel occlusion. 3. Disc protrusions C3-4 and C4-5 Electronically Signed   By: Franchot Gallo M.D.   On: 05/24/2019 15:27   Mr Brain Wo Contrast  Result Date: 05/24/2019 CLINICAL DATA:  Stroke. Retinal artery occlusion. Left eye vision problem. EXAM: MRI HEAD WITHOUT CONTRAST MRA HEAD WITHOUT CONTRAST  TECHNIQUE: Multiplanar, multiecho pulse sequences of the brain and surrounding structures were obtained without intravenous contrast. Angiographic images of the head were obtained using MRA technique without contrast. COMPARISON:  None. FINDINGS: MRI HEAD FINDINGS Brain: Negative for acute infarct. Ventricle size and cerebral volume normal. Moderate chronic microvascular ischemic change in the white matter and pons bilaterally. Negative for hemorrhage or mass. Vascular: Normal arterial flow voids Skull and upper cervical spine: No acute skeletal abnormality. Relatively large disc protrusion on the left at C3-4 with spinal  stenosis. Smaller central disc protrusion C4-5. Limited evaluation. Sinuses/Orbits: Mucosal edema paranasal sinuses. Bilateral cataract surgery. Other: None MRA HEAD FINDINGS Both vertebral arteries patent to the basilar without stenosis. PICA patent bilaterally. Basilar widely patent. Superior cerebellar and posterior cerebral arteries patent bilaterally. Atherosclerotic irregularity in the cavernous carotid bilaterally with mild stenosis bilaterally. Anterior and middle cerebral arteries patent bilaterally without significant stenosis. IMPRESSION: 1. Negative for acute infarct. Moderate chronic microvascular ischemic change in the white matter 2. Atherosclerotic irregularity and mild stenosis in the cavernous carotid bilaterally. No large vessel occlusion. 3. Disc protrusions C3-4 and C4-5 Electronically Signed   By: Franchot Gallo M.D.   On: 05/24/2019 15:27   Vas US Carotid  Result Date: 05/25/2019 Carotid Arterial Duplex Study Indications:  Visual disturbance. Risk Factors: Hypertension. Performing Technologist: Maudry Mayhew MHA, RDMS, RVT, RDCS  Examination Guidelines: A complete evaluation includes B-mode imaging, spectral Doppler, color Doppler, and power Doppler as needed of all accessible portions of each vessel. Bilateral testing is considered an integral part of a complete examination. Limited examinations for reoccurring indications may be performed as noted.  Right Carotid Findings: +----------+--------+--------+--------+-----------------------+--------+           PSV cm/sEDV cm/sStenosisDescribe               Comments +----------+--------+--------+--------+-----------------------+--------+ CCA Prox  122     7                                               +----------+--------+--------+--------+-----------------------+--------+ CCA Distal100     7               smooth and heterogenous         +----------+--------+--------+--------+-----------------------+--------+ ICA  Prox  45      7               smooth and heterogenous         +----------+--------+--------+--------+-----------------------+--------+ ICA Distal72      13                                              +----------+--------+--------+--------+-----------------------+--------+ ECA       123     5               smooth and heterogenous         +----------+--------+--------+--------+-----------------------+--------+ +----------+--------+-------+----------------+-------------------+           PSV cm/sEDV cmsDescribe        Arm Pressure (mmHG) +----------+--------+-------+----------------+-------------------+ [id number]            Multiphasic, WNL                    +----------+--------+-------+----------------+-------------------+ +---------+--------+--+--------+-+---------+ VertebralPSV cm/s42EDV cm/s6Antegrade +---------+--------+--+--------+-+---------+  Left Carotid Findings: +----------+--------+-------+--------+--------------------------------+--------+  PSV cm/sEDV    StenosisDescribe                        Comments                   cm/s                                                    +----------+--------+-------+--------+--------------------------------+--------+ CCA Prox  132     17             smooth, heterogenous and                                                  calcific                                 +----------+--------+-------+--------+--------------------------------+--------+ CCA Distal103     17             smooth and heterogenous                  +----------+--------+-------+--------+--------------------------------+--------+ ICA Prox  83      19             smooth and heterogenous                  +----------+--------+-------+--------+--------------------------------+--------+ ICA Distal94      24                                                       +----------+--------+-------+--------+--------------------------------+--------+ ECA       109                                                             +----------+--------+-------+--------+--------------------------------+--------+ +----------+--------+--------+----------------+-------------------+ SubclavianPSV cm/sEDV cm/sDescribe        Arm Pressure (mmHG) +----------+--------+--------+----------------+-------------------+           144             Multiphasic, WNL                    +----------+--------+--------+----------------+-------------------+ +---------+--------+---+--------+-+---------+ VertebralPSV cm/s181EDV cm/s6Antegrade +---------+--------+---+--------+-+---------+  Summary: Right Carotid: Velocities in the right ICA are consistent with a 1-39% stenosis. Left Carotid: Velocities in the left ICA are consistent with a 1-39% stenosis. Vertebrals:  Bilateral vertebral arteries demonstrate antegrade flow. Subclavians: Normal flow hemodynamics were seen in bilateral subclavian              arteries. *See table(s) above for measurements and observations.  Electronically signed by Servando Snare MD on 05/25/2019 at 12:39:31 PM.    Final      Scheduled Meds: . aspirin EC  81 mg Oral Daily  . atorvastatin  40 mg Oral q1800  . clopidogrel  75 mg Oral  Daily  . enoxaparin (LOVENOX) injection  40 mg Subcutaneous Q24H  . nicotine  14 mg Transdermal Daily   Continuous Infusions: . sodium chloride Stopped (05/25/19 0340)     LOS: 1 day   Time Spent in minutes   30 minutes  Ellery Meroney D.O. on 05/25/2019 at 1:57 PM  Between 7am to 7pm - Please see pager noted on [email or web address]  After 7pm go to [email or web address]  And look for the night coverage person covering for me after hours  [facility] Office  [phone]

## 2019-05-25 NOTE — Progress Notes (Addendum)
Pt had order to be discharged home by Dr Ree Kida, AVS previewed and discharge instructions explained and given to pt, Pt called daughter to pick her up, pt was reassured and was taken down in a wheelchair at 2031. [place]-As[place], Maeson Lourenco Efe

## 2019-05-25 NOTE — Consult Note (Addendum)
ELECTROPHYSIOLOGY CONSULT NOTE  Patient ID: Elaine Turner MRN: [id number], DOB/AGE: 1946-12-05   Admit date: 05/24/2019 Date of Consult: 05/25/2019  Primary Physician: Biagio Borg, MD Primary Cardiologist: none Reason for Consultation: Cryptogenic stroke ; recommendations regarding Implantable Loop Recorder, requested by Dr. Leonie Man  History of Present Illness [name] was admitted on 05/24/2019 with L eye visual changes and then suddenly blind in the same eye.   PMHx includes HTN, PVD, hyperglycemia Neuro notes:CRAO .  she has undergone workup for stroke including echocardiogram and carotid dopplers.  The patient has been monitored on telemetry which has demonstrated sinus rhythm with no arrhythmias.  Inpatient stroke work-up is to be completed with a TEE for the purposes of further evaluating the R sided findings on her echo.  Neurology asks loop be placed regardless with retinal artery occlusion.   Echocardiogram this admission demonstrated  Transthoracic Echocardiogram  1. The left ventricle has normal systolic function with an ejection fraction of 60-65%. The cavity size was normal. Left ventricular diastolic Doppler parameters are consistent with impaired relaxation. Elevated mean left atrial pressure. 2. The right ventricle has normal systolic function. The cavity was normal. There is no increase in right ventricular wall thickness. 3. Left atrial size was mildly dilated. 4. Mobile echodensity noted on the RA side of the interatrial septum measuring 1.28 cm x 0.611 cm. No ASD or PFO by saline microcavitation study or color flow Doppler. 5. Small pericardial effusion. 6. No evidence of mitral valve stenosis. 7. The aortic valve is tricuspid. Moderate thickening of the aortic valve. Moderate calcification of the aortic valve. No stenosis of the aortic valve. 8. The ascending aorta is normal in size and structure.   Lab work is reviewed.  Prior to admission, the  patient denies chest pain, shortness of breath, dizziness, palpitations, or syncope.  She remains with visual defecits of the L eye with plans to home at discharge.    Past Medical History:  Diagnosis Date  . 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 Aortic atherosclerosis ([place])   . Arthritis   . Chronic diarrhea   . COPD  GOLD 0 / active smoker  10/24/2015  . Diverticulitis   . Diverticulosis   . Esophageal stricture   . Heart murmur   . Hemorrhoids   . Hyperglycemia 05/01/2013  . Hypertension   . 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 Pneumonia 05/01/2013  . PVD (peripheral vascular disease) ([place])   . Urine incontinence      Surgical History:  Past Surgical History:  Procedure Laterality Date  . ABDOMINAL HYSTERECTOMY    . HEMORRHOID SURGERY N/A 06/26/2015   Procedure: INTERNAL AND EXTERNAL HEMORRHOIDECTOMY;  Surgeon: Georganna Skeans, MD;  Location: [facility];  Service: General;  Laterality: N/A;     Medications Prior to Admission  Medication Sig Dispense Refill Last Dose  . aspirin 81 MG EC tablet Take 1 tablet (81 mg total) by mouth daily. Swallow whole. 30 tablet 12 05/24/2019 at Unknown time  . fluticasone (FLONASE) 50 MCG/ACT nasal spray Place 2 sprays into both nostrils daily. (Patient taking differently: Place 2 sprays into both nostrils as needed for rhinitis. ) 16 g 6 05/10/2019 at prn  . losartan (COZAAR) 25 MG tablet Take 1 tablet (25 mg total) by mouth 2 (two) times daily. 180 tablet 1 05/24/2019 at Unknown time  . NIFEdipine (PROCARDIA XL/NIFEDICAL-XL) 90 MG 24 hr tablet  TAKE 1 TABLET (90 MG TOTAL) BY MOUTH DAILY. (Patient taking differently: Take 90 mg by mouth daily. ) 90 tablet 1 05/24/2019 at Unknown time  . zolpidem (AMBIEN) 5 MG tablet Take 1 tablet (5 mg total) by mouth at bedtime as needed for sleep. 30 tablet 5 Past Month at prn  . nystatin (MYCOSTATIN) 100000 UNIT/ML suspension  Take 5 mLs (500,000 Units total) by mouth 4 (four) times daily for 10 days. 60 mL 0     Inpatient Medications:  . aspirin EC  81 mg Oral Daily  . atorvastatin  40 mg Oral q1800  . clopidogrel  75 mg Oral Daily  . enoxaparin (LOVENOX) injection  40 mg Subcutaneous Q24H  . nicotine  14 mg Transdermal Daily    Allergies:  Allergies  Allergen Reactions  . Ace Inhibitors Cough  . Levaquin [Levofloxacin] Other (See Comments)    GI upset    Social History   Socioeconomic History  . Marital status: Married    Spouse name: Not on file  . Number of children: Not on file  . Years of education: 78  . Highest education level: Not on file  Occupational History  . Occupation: retired  Scientific laboratory technician  . Financial resource strain: Not on file  . Food insecurity:    Worry: Not on file    Inability: Not on file  . Transportation needs:    Medical: Not on file    Non-medical: Not on file  Tobacco Use  . Smoking status: Current Every Day Smoker    Packs/day: 1.00    Years: 62.00    Pack years: 62.00    Types: Cigarettes  . Smokeless tobacco: Never Used  Substance and Sexual Activity  . Alcohol use: Yes    Comment: pt reports drinking 1 pint  per week  . Drug use: No  . Sexual activity: Not Currently  Lifestyle  . Physical activity:    Days per week: Not on file    Minutes per session: Not on file  . Stress: Not on file  Relationships  . Social connections:    Talks on phone: Not on file    Gets together: Not on file    Attends religious service: Not on file    Active member of club or organization: Not on file    Attends meetings of clubs or organizations: Not on file    Relationship status: Not on file  . Intimate partner violence:    Fear of current or ex partner: Not on file    Emotionally abused: Not on file    Physically abused: Not on file    Forced sexual activity: Not on file  Other Topics Concern  . Not on file  Social History Narrative  . Not on file      Family History  Problem Relation Age of Onset  . Hypertension Mother   . Lung cancer Father   . Colon cancer Neg Hx   . Esophageal cancer Neg Hx   . Pancreatic cancer Neg Hx   . Liver disease Neg Hx   . Stroke Neg Hx       Review of Systems: All other systems reviewed and are otherwise negative except as noted above.  Physical Exam: Vitals:   05/25/19 0024 05/25/19 0359 05/25/19 0800 05/25/19 1300  BP: (!) 141/87 (!) 142/55 (!) 153/72 126/83  Pulse: 67 67 71 72  Resp: 16 (!) 22 17 (!) 21  Temp: 98 F (36.7 C) 97.7  F (36.5 C) 98.2 F (36.8 C) 98.4 F (36.9 C)  TempSrc: Oral Oral Oral Oral  SpO2: 94% 96% 98% 98%  Weight:      Height:       Limited, focused exam given COVID19 pandemic GEN- The patient is well appearing, alert and oriented x 3 today.   Head- normocephalic, atraumatic Eyes-  Sclera clear, conjunctiva pink Ears- hearing intact Oropharynx- not examined Neck- supple Lungs- CTA b/l, normal work of breathing Heart- RRR, no murmurs, rubs or gallops  GI- not examined Extremities- no clubbing, cyanosis, or edema MS- no significant deformity or atrophy Skin- no rash or lesion Psych- euthymic mood, full affect   Labs:   Lab Results  Component Value Date   WBC 10.3 05/24/2019   HGB 14.9 05/24/2019   HCT 49.1 (H) 05/24/2019   MCV 75.8 (L) 05/24/2019   PLT 409 (H) 05/24/2019    Recent Labs  Lab 05/24/19 1105 05/25/19 0401  NA 137 142  K 3.1* 3.1*  CL 104 105  CO2 20* 24  BUN 10 10  CREATININE 1.16* 1.11*  CALCIUM 9.1 9.1  PROT 6.8  --   BILITOT 0.9  --   ALKPHOS 90  --   ALT 12  --   AST 13*  --   GLUCOSE 101* 79   No results found for: CKTOTAL, CKMB, CKMBINDEX, TROPONINI Lab Results  Component Value Date   CHOL 144 05/25/2019   CHOL 175 01/06/2018   CHOL 170 03/19/2017   Lab Results  Component Value Date   HDL 52 05/25/2019   HDL 64.30 01/06/2018   HDL 48.40 03/19/2017   Lab Results  Component Value Date   LDLCALC 78  05/25/2019   LDLCALC 91 01/06/2018   LDLCALC 102 (H) 03/19/2017   Lab Results  Component Value Date   TRIG 69 05/25/2019   TRIG 97.0 01/06/2018   TRIG 98.0 03/19/2017   Lab Results  Component Value Date   CHOLHDL 2.8 05/25/2019   CHOLHDL 3 01/06/2018   CHOLHDL 4 03/19/2017   No results found for: LDLDIRECT  No results found for: DDIMER   Radiology/Studies:   Mr Jodene Nam Head Wo Contrast Result Date: 05/24/2019 CLINICAL DATA:  Stroke. Retinal artery occlusion. Left eye vision problem. EXAM: MRI HEAD WITHOUT CONTRAST MRA HEAD WITHOUT CONTRAST TECHNIQUE: Multiplanar, multiecho pulse sequences of the brain and surrounding structures were obtained without intravenous contrast. Angiographic images of the head were obtained using MRA technique without contrast. COMPARISON:  None. FINDINGS: MRI HEAD FINDINGS Brain: Negative for acute infarct. Ventricle size and cerebral volume normal. Moderate chronic microvascular ischemic change in the white matter and pons bilaterally. Negative for hemorrhage or mass. Vascular: Normal arterial flow voids Skull and upper cervical spine: No acute skeletal abnormality. Relatively large disc protrusion on the left at C3-4 with spinal stenosis. Smaller central disc protrusion C4-5. Limited evaluation. Sinuses/Orbits: Mucosal edema paranasal sinuses. Bilateral cataract surgery. Other: None MRA HEAD FINDINGS Both vertebral arteries patent to the basilar without stenosis. PICA patent bilaterally. Basilar widely patent. Superior cerebellar and posterior cerebral arteries patent bilaterally. Atherosclerotic irregularity in the cavernous carotid bilaterally with mild stenosis bilaterally. Anterior and middle cerebral arteries patent bilaterally without significant stenosis. IMPRESSION: 1. Negative for acute infarct. Moderate chronic microvascular ischemic change in the white matter 2. Atherosclerotic irregularity and mild stenosis in the cavernous carotid bilaterally. No large  vessel occlusion. 3. Disc protrusions C3-4 and C4-5 Electronically Signed   By: Franchot Gallo M.D.   On: 05/24/2019  15:71     Vas US Carotid Result Date: 05/25/2019 Carotid Arterial Duplex Study Indications:  Visual disturbance. Risk Factors: Hypertension. Performing Technologist: Maudry Mayhew MHA, RDMS, RVT, RDCS  Examination Guidelines: A complete evaluation includes B-mode imaging, spectral Doppler, color Doppler, and power Doppler as needed of all accessible portions of each vessel. Bilateral testing is considered an integral part of a complete examination. Limited examinations for reoccurring indications may be performed as noted.  Right Carotid Findings:  Summary: Right Carotid: Velocities in the right ICA are consistent with a 1-39% stenosis. Left Carotid: Velocities in the left ICA are consistent with a 1-39% stenosis. Vertebrals:  Bilateral vertebral arteries demonstrate antegrade flow. Subclavians: Normal flow hemodynamics were seen in bilateral subclavian              arteries. *See table(s) above for measurements and observations.  Electronically signed by Servando Snare MD on 05/25/2019 at 12:39:31 PM.    Final     12-lead ECG SR All prior EKG's in EPIC reviewed with no documented atrial fibrillation  Telemetry SR, occ PVCs, rare couplets, NSVT 4 beats x1  Assessment and Plan:  1. Cryptogenic stroke The patient presents with cryptogenic stroke.    Dr. Curt Bears spoke at length with the patient about monitoring for afib with either a 30 day event monitor or an implantable loop recorder.  Risks, benefits, and alteratives to implantable loop recorder were discussed with the patient today.   At this time, the patient is very clear in her decision to proceed with implantable loop recorder.   Wound care was reviewed with the patient (keep incision clean and dry for 3 days).  Wound check Gianfranco Araki be scheduled for the patient  Please call with questions.   Baldwin Jamaica,  PA-C 05/25/2019  I have seen and examined this patient with Tommye Standard.  Agree with above, note added to reflect my findings.  On exam, RRR, no mrumurs, lungs clear.  Patient presented to the hospital with cryptogenic stroke. To date, no cause has been found. TEE planned for today. If unrevealing, Brittannie Tawney plan for LINQ monitor to look for atrial fibrillation. Risks and benefits discussed. Risks include but not limited to bleeding and infection. The patient understands the risks and has agreed to the procedure.  Gershon Shorten M. Nalany Steedley MD 05/25/2019 5:43 PM  \

## 2019-05-25 NOTE — Evaluation (Signed)
Occupational Therapy Evaluation and Discharge Patient Details Name: Elaine Turner MRN: [id number] DOB: 04/13/1946 Today's Date: 05/25/2019    History of Present Illness Pt is a 73 y/o female admitted following sudden onset L eye vision loss. Thought likely to be secondary to ischemic optic neuropathy in L eye. PMH includes CKD, HTN, tobacco dependence, and COPD.    Clinical Impression   Pt is functioning independently in mobility and ADL. Educated in safety related to vision in L eye and compensatory strategies. Pt reports improvement in vision, able to read large print, but not small. No further OT needs.    Follow Up Recommendations  No OT follow up    Equipment Recommendations  None recommended by OT    Recommendations for Other Services       Precautions / Restrictions Precautions Precautions: None      Mobility Bed Mobility               General bed mobility comments: In chair upon entry.   Transfers Overall transfer level: Independent Equipment used: None                  Balance Overall balance assessment: No apparent balance deficits (not formally assessed)                                         ADL either performed or assessed with clinical judgement   ADL Overall ADL's : Independent                                       General ADL Comments: Educated pt in implications of vision changes in L eye with regard to depth perception with mobility, ADL and driving.     Vision   Additional Comments: pt reports improvement in L eye acuity, able to read large print     Perception     Praxis      Pertinent Vitals/Pain Pain Assessment: No/denies pain     Hand Dominance Right   Extremity/Trunk Assessment Upper Extremity Assessment Upper Extremity Assessment: Overall WFL for tasks assessed   Lower Extremity Assessment Lower Extremity Assessment: Overall WFL for tasks assessed   Cervical / Trunk  Assessment Cervical / Trunk Assessment: Normal   Communication Communication Communication: No difficulties   Cognition Arousal/Alertness: Awake/alert Behavior During Therapy: WFL for tasks assessed/performed Overall Cognitive Status: Within Functional Limits for tasks assessed                                     General Comments       Exercises     Shoulder Instructions      Home Living Family/patient expects to be discharged to:: Private residence Living Arrangements: Alone Available Help at Discharge: Family;Available PRN/intermittently Type of Home: House Home Access: Stairs to enter [employer] of Steps: 1   Home Layout: One level     Bathroom Shower/Tub: Occupational psychologist: Standard     Home Equipment: None      Lives With: Alone    Prior Functioning/Environment Level of Independence: Independent                 OT Problem List:  OT Treatment/Interventions:      OT Goals(Current goals can be found in the care plan section) Acute Rehab OT Goals Patient Stated Goal: "to figure out what is going on with my eye"  OT Frequency:     Barriers to D/C:            Co-evaluation              AM-PAC OT "6 Clicks" Daily Activity     Outcome Measure Help from another person eating meals?: None Help from another person taking care of personal grooming?: None Help from another person toileting, which includes using toliet, bedpan, or urinal?: None Help from another person bathing (including washing, rinsing, drying)?: None Help from another person to put on and taking off regular upper body clothing?: None Help from another person to put on and taking off regular lower body clothing?: None 6 Click Score: 24   End of Session Equipment Utilized During Treatment: Gait belt  Activity Tolerance: Patient tolerated treatment well Patient left: in chair;with call bell/phone within reach;with chair alarm  set  OT Visit Diagnosis: Other (comment)(vision impairment)                Time: 1610-9604 OT Time Calculation (min): 16 min Charges:  OT General Charges $OT Visit: 1 Visit OT Evaluation $OT Eval Low Complexity: 1 Low  Nestor Lewandowsky, OTR/L Acute Rehabilitation Services Pager: [phone] Office: [phone]  Malka So 05/25/2019, 8:46 AM

## 2019-05-25 NOTE — Progress Notes (Addendum)
STROKE TEAM PROGRESS NOTE   HISTORY OF PRESENT ILLNESS (per Dr Cheral Marker) Elaine Turner is a 73 y.o. female with history of hypertension, hyperglycemia, PVD, heart murmur, aortic atherosclerosis.  Patient states that she suddenly started to see spots in her left eye on evening of 5/26.  She then suddenly lost all vision in her left eye.  She denies any pain or headache at that time.  She denies ever having this issue prior to this event.  She noted that some of her vision came back the following mornign, but she continued to have spots in her left eye.  At that time she went and saw her eye doctor Dr. Katy Fitch.  She had a dilated eye exam which showed a CRAO on the left.  Patient was sent immediately to the emergency department to have a stroke work-up.   SUBJECTIVE (INTERVAL HISTORY) Echo showed the known aortic atherosclerosis. Pt tells me she knows she should be taking ASA daily, but admits she does not. Cardiology consulted for TEE and further wk up   OBJECTIVE Vitals:   05/24/19 2159 05/25/19 0024 05/25/19 0359 05/25/19 0800  BP: (!) 160/67 (!) 141/87 (!) 142/55 (!) 153/72  Pulse: 67 67 67 71  Resp: 15 16 (!) 22 17  Temp: 97.7 F (36.5 C) 98 F (36.7 C) 97.7 F (36.5 C) 98.2 F (36.8 C)  TempSrc: Oral Oral Oral Oral  SpO2: 98% 94% 96% 98%  Weight:      Height:        CBC:  Recent Labs  Lab 05/24/19 1105  WBC 10.3  NEUTROABS 8.1*  HGB 14.9  HCT 49.1*  MCV 75.8*  PLT 409*    Basic Metabolic Panel:  Recent Labs  Lab 05/24/19 1105 05/25/19 0401  NA 137 142  K 3.1* 3.1*  CL 104 105  CO2 20* 24  GLUCOSE 101* 79  BUN 10 10  CREATININE 1.16* 1.11*  CALCIUM 9.1 9.1    Lipid Panel:     Component Value Date/Time   CHOL 144 05/25/2019 0401   TRIG 69 05/25/2019 0401   HDL 52 05/25/2019 0401   CHOLHDL 2.8 05/25/2019 0401   VLDL 14 05/25/2019 0401   LDLCALC 78 05/25/2019 0401   HgbA1c:  Lab Results  Component Value Date   HGBA1C 5.5 05/25/2019   Urine Drug  Screen:     Component Value Date/Time   LABOPIA NONE DETECTED 05/24/2019 1140   COCAINSCRNUR NONE DETECTED 05/24/2019 1140   LABBENZ NONE DETECTED 05/24/2019 1140   AMPHETMU NONE DETECTED 05/24/2019 1140   THCU NONE DETECTED 05/24/2019 1140   LABBARB NONE DETECTED 05/24/2019 1140    Alcohol Level     Component Value Date/Time   [facility] <10 05/24/2019 1116    IMAGING   Mr Jodene Nam Head Wo Contrast  Result Date: 05/24/2019 CLINICAL DATA:  Stroke. Retinal artery occlusion. Left eye vision problem. EXAM: MRI HEAD WITHOUT CONTRAST MRA HEAD WITHOUT CONTRAST TECHNIQUE: Multiplanar, multiecho pulse sequences of the brain and surrounding structures were obtained without intravenous contrast. Angiographic images of the head were obtained using MRA technique without contrast. COMPARISON:  None. FINDINGS: MRI HEAD FINDINGS Brain: Negative for acute infarct. Ventricle size and cerebral volume normal. Moderate chronic microvascular ischemic change in the white matter and pons bilaterally. Negative for hemorrhage or mass. Vascular: Normal arterial flow voids Skull and upper cervical spine: No acute skeletal abnormality. Relatively large disc protrusion on the left at C3-4 with spinal stenosis. Smaller central disc protrusion C4-5.  Limited evaluation. Sinuses/Orbits: Mucosal edema paranasal sinuses. Bilateral cataract surgery. Other: None MRA HEAD FINDINGS Both vertebral arteries patent to the basilar without stenosis. PICA patent bilaterally. Basilar widely patent. Superior cerebellar and posterior cerebral arteries patent bilaterally. Atherosclerotic irregularity in the cavernous carotid bilaterally with mild stenosis bilaterally. Anterior and middle cerebral arteries patent bilaterally without significant stenosis. IMPRESSION: 1. Negative for acute infarct. Moderate chronic microvascular ischemic change in the white matter 2. Atherosclerotic irregularity and mild stenosis in the cavernous carotid bilaterally. No  large vessel occlusion. 3. Disc protrusions C3-4 and C4-5 Electronically Signed   By: Franchot Gallo M.D.   On: 05/24/2019 15:27   Mr Brain Wo Contrast  Result Date: 05/24/2019 CLINICAL DATA:  Stroke. Retinal artery occlusion. Left eye vision problem. EXAM: MRI HEAD WITHOUT CONTRAST MRA HEAD WITHOUT CONTRAST TECHNIQUE: Multiplanar, multiecho pulse sequences of the brain and surrounding structures were obtained without intravenous contrast. Angiographic images of the head were obtained using MRA technique without contrast. COMPARISON:  None. FINDINGS: MRI HEAD FINDINGS Brain: Negative for acute infarct. Ventricle size and cerebral volume normal. Moderate chronic microvascular ischemic change in the white matter and pons bilaterally. Negative for hemorrhage or mass. Vascular: Normal arterial flow voids Skull and upper cervical spine: No acute skeletal abnormality. Relatively large disc protrusion on the left at C3-4 with spinal stenosis. Smaller central disc protrusion C4-5. Limited evaluation. Sinuses/Orbits: Mucosal edema paranasal sinuses. Bilateral cataract surgery. Other: None MRA HEAD FINDINGS Both vertebral arteries patent to the basilar without stenosis. PICA patent bilaterally. Basilar widely patent. Superior cerebellar and posterior cerebral arteries patent bilaterally. Atherosclerotic irregularity in the cavernous carotid bilaterally with mild stenosis bilaterally. Anterior and middle cerebral arteries patent bilaterally without significant stenosis. IMPRESSION: 1. Negative for acute infarct. Moderate chronic microvascular ischemic change in the white matter 2. Atherosclerotic irregularity and mild stenosis in the cavernous carotid bilaterally. No large vessel occlusion. 3. Disc protrusions C3-4 and C4-5 Electronically Signed   By: Franchot Gallo M.D.   On: 05/24/2019 15:27   Vas US Carotid  Result Date: 05/25/2019 Carotid Arterial Duplex Study Indications:  Visual disturbance. Risk Factors:  Hypertension. Performing Technologist: Maudry Mayhew MHA, RDMS, RVT, RDCS  Examination Guidelines: A complete evaluation includes B-mode imaging, spectral Doppler, color Doppler, and power Doppler as needed of all accessible portions of each vessel. Bilateral testing is considered an integral part of a complete examination. Limited examinations for reoccurring indications may be performed as noted.  Right Carotid Findings: +----------+--------+--------+--------+-----------------------+--------+           PSV cm/sEDV cm/sStenosisDescribe               Comments +----------+--------+--------+--------+-----------------------+--------+ CCA Prox  122     7                                               +----------+--------+--------+--------+-----------------------+--------+ CCA Distal100     7               smooth and heterogenous         +----------+--------+--------+--------+-----------------------+--------+ ICA Prox  45      7               smooth and heterogenous         +----------+--------+--------+--------+-----------------------+--------+ ICA Distal72      13                                              +----------+--------+--------+--------+-----------------------+--------+  ECA       123     5               smooth and heterogenous         +----------+--------+--------+--------+-----------------------+--------+ +----------+--------+-------+----------------+-------------------+           PSV cm/sEDV cmsDescribe        Arm Pressure (mmHG) +----------+--------+-------+----------------+-------------------+ [id number]            Multiphasic, WNL                    +----------+--------+-------+----------------+-------------------+ +---------+--------+--+--------+-+---------+ VertebralPSV cm/s42EDV cm/s6Antegrade +---------+--------+--+--------+-+---------+  Left Carotid Findings:  +----------+--------+-------+--------+--------------------------------+--------+           PSV cm/sEDV    StenosisDescribe                        Comments                   cm/s                                                    +----------+--------+-------+--------+--------------------------------+--------+ CCA Prox  132     17             smooth, heterogenous and                                                  calcific                                 +----------+--------+-------+--------+--------------------------------+--------+ CCA Distal103     17             smooth and heterogenous                  +----------+--------+-------+--------+--------------------------------+--------+ ICA Prox  83      19             smooth and heterogenous                  +----------+--------+-------+--------+--------------------------------+--------+ ICA Distal94      24                                                      +----------+--------+-------+--------+--------------------------------+--------+ ECA       109                                                             +----------+--------+-------+--------+--------------------------------+--------+ +----------+--------+--------+----------------+-------------------+ SubclavianPSV cm/sEDV cm/sDescribe        Arm Pressure (mmHG) +----------+--------+--------+----------------+-------------------+           144             Multiphasic, WNL                    +----------+--------+--------+----------------+-------------------+ +---------+--------+---+--------+-+---------+  VertebralPSV cm/s181EDV cm/s6Antegrade +---------+--------+---+--------+-+---------+  Summary: Right Carotid: Velocities in the right ICA are consistent with a 1-39% stenosis. Left Carotid: Velocities in the left ICA are consistent with a 1-39% stenosis. Vertebrals:  Bilateral vertebral arteries demonstrate antegrade flow. Subclavians:  Normal flow hemodynamics were seen in bilateral subclavian              arteries. *See table(s) above for measurements and observations.  Electronically signed by Servando Snare MD on 05/25/2019 at 12:39:31 PM.    Final    Transthoracic Echocardiogram   1. The left ventricle has normal systolic function with an ejection fraction of 60-65%. The cavity size was normal. Left ventricular diastolic Doppler parameters are consistent with impaired relaxation. Elevated mean left atrial pressure.  2. The right ventricle has normal systolic function. The cavity was normal. There is no increase in right ventricular wall thickness.  3. Left atrial size was mildly dilated.  4. Mobile echodensity noted on the RA side of the interatrial septum measuring 1.28 cm x 0.611 cm. No ASD or PFO by saline microcavitation study or color flow Doppler.  5. Small pericardial effusion.  6. No evidence of mitral valve stenosis.  7. The aortic valve is tricuspid. Moderate thickening of the aortic valve. Moderate calcification of the aortic valve. No stenosis of the aortic valve.  8. The ascending aorta is normal in size and structure.  Bilateral Carotid Dopplers  00/00/2020 Pending  PHYSICAL EXAM Blood pressure (!) 153/72, pulse 71, temperature 98.2 F (36.8 C), temperature source Oral, resp. rate 17, height 5\' 2"  (1.575 m), weight 63.5 kg, SpO2 98 %.  GENl: Appears well-developed and well-nourished.  Pleasant elderly African-American lady Psych: Affect appropriate to situation Eyes: No scleral injection HENT: No OP obstrucion Head: Normocephalic.  Cardiovascular: Normal rate and regular rhythm.  Respiratory: Effort normal, non-labored breathing GI: Soft.  No distension. There is no tenderness.  Skin: WDI  Neuro: Mental Status: Patient is awake, alert, oriented to person, place, month, year, and situation. Patient is able to give a clear and coherent history. No signs of aphasia or neglect Cranial Nerves: II:  Visual Fields are full.  However she states that she has spots in her left eye when looking at the television and at examiner. She has normal caliber vessels OD on funduscopic exam; decreased caliber or arterial and venous circulation on funduscopic exam OS, as well as some patchy retinal pallor.   Vision acuity is severely diminished in the left eye and she can see moving objects but cannot make out details about color and shape III,IV, VI: EOMI without ptosis or diploplia. Pupils equal, 3 mm round not reactive to light V: Facial sensation is symmetric to temperature VII: Facial movement is symmetric.  VIII: hearing is intact to voice X: Palat elevates symmetrically XI: Shoulder shrug is symmetric. XII: tongue is midline without atrophy or fasciculations.  Motor: Tone is normal. Bulk is normal. 5/5 strength was present in all four extremities.  Sensory: Sensation is symmetric to light touch and temperature in the arms and legs. Deep Tendon Reflexes: 2+ and symmetric in the biceps with 1+ at the patella Plantars: Toes are downgoing bilaterally.  Cerebellar: FNF and HKS are intact bilaterally      HOME MEDICATIONS:  Medications Prior to Admission  Medication Sig Dispense Refill  . aspirin 81 MG EC tablet Take 1 tablet (81 mg total) by mouth daily. Swallow whole. 30 tablet 12  . fluticasone (FLONASE) 50 MCG/ACT nasal spray Place 2 sprays into  both nostrils daily. (Patient taking differently: Place 2 sprays into both nostrils as needed for rhinitis. ) 16 g 6  . losartan (COZAAR) 25 MG tablet Take 1 tablet (25 mg total) by mouth 2 (two) times daily. 180 tablet 1  . NIFEdipine (PROCARDIA XL/NIFEDICAL-XL) 90 MG 24 hr tablet TAKE 1 TABLET (90 MG TOTAL) BY MOUTH DAILY. (Patient taking differently: Take 90 mg by mouth daily. ) 90 tablet 1  . zolpidem (AMBIEN) 5 MG tablet Take 1 tablet (5 mg total) by mouth at bedtime as needed for sleep. 30 tablet 5  . nystatin (MYCOSTATIN) 100000 UNIT/ML  suspension Take 5 mLs (500,000 Units total) by mouth 4 (four) times daily for 10 days. 60 mL 0      HOSPITAL MEDICATIONS:  . aspirin EC  81 mg Oral Daily  . atorvastatin  40 mg Oral q1800  . enoxaparin (LOVENOX) injection  40 mg Subcutaneous Q24H  . nicotine  14 mg Transdermal Daily    ALLERGIES Allergies  Allergen Reactions  . Ace Inhibitors Cough  . Levaquin [Levofloxacin] Other (See Comments)    GI upset    PAST MEDICAL HISTORY Past Medical History:  Diagnosis Date  . 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 Aortic atherosclerosis ([place])   . Arthritis   . Chronic diarrhea   . COPD  GOLD 0 / active smoker  10/24/2015  . Diverticulitis   . Diverticulosis   . Esophageal stricture   . Heart murmur   . Hemorrhoids   . Hyperglycemia 05/01/2013  . Hypertension   . 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 Pneumonia 05/01/2013  . PVD (peripheral vascular disease) ([place])   . Urine incontinence     SURGICAL HISTORY Past Surgical History:  Procedure Laterality Date  . ABDOMINAL HYSTERECTOMY    . HEMORRHOID SURGERY N/A 06/26/2015   Procedure: INTERNAL AND EXTERNAL HEMORRHOIDECTOMY;  Surgeon: Georganna Skeans, MD;  Location: [facility];  Service: General;  Laterality: N/A;    FAMILY HISTORY Family History  Problem Relation Age of Onset  . Hypertension Mother   . Lung cancer Father   . Colon cancer Neg Hx   . Esophageal cancer Neg Hx   . Pancreatic cancer Neg Hx   . Liver disease Neg Hx   . Stroke Neg Hx     SOCIAL HISTORY  reports that she has been smoking cigarettes. She has a 62.00 pack-year smoking history. She has never used smokeless tobacco. She reports current alcohol use. She reports that she does not use drugs.  ASSESSMENT/PLAN [name] is a 73 y.o. female presenting with left eye vision loss, diagnosed with CRAO by Dr Katy Fitch, opthalmology. Sent to the ER for stroke wk up.  She did not receive IV t-PA due to outside of time window; no LVO.   Stroke: CRAO  Resultant  Left eye vision loss  MRI head - neg for further infarcts  MRA head - moderate chronic microvascular dz, irreg atherosclerotic disease in bilat carotids, only causing mild stenosis  Carotid Doppler - pending  2D Echo - Severe dilation of Aorta with athero dx, there is an area of Mobile echodensity noted on the RA EF 60%. TEE requested and d/w Cardiology today  Lacey Jensen Virus 2 neg  LDL - 78  HgbA1c - 5.5  UDS - neg  VTE prophylaxis - Lovenox  Diet heart healthy  none prior to admission, now on ASA 81mg  +  Plavix 75mg  for 3 weeks, then monotherapy with ASA only, unles cardiology has other recs pdinding TEE  Loop recorder after TEE  Patient counseled to be compliant with her antithrombotic medications  Ongoing aggressive stroke risk factor management  Therapy recommendations:  pending  Disposition:  Pending  Hypertension  Stable . Permissive hypertension (OK if < 220/120) but gradually normalize in 5-7 days . Long-term BP goal normotensive  Hyperlipidemia  Lipid lowering medication PTA:  none  LDL 78, goal < 70  Current lipid lowering medication:Lipitor 40mg  QD  Continue statin at discharge  Diabetes  HgbA1c 5.5, goal < 7.0  Controlled  Other Stroke Risk Factors  Advanced age  Cigarette smoker; advised to stop smoking  Coronary artery disease/PVD/Known aortic atherosclerosis  Other Active Problems COPD H/o anal cancer No driving till cleared by [facility] day # 1  Desiree Metzger-Cihelka, ARNP-C, ANVP-BC  I have personally obtained history,examined this patient, reviewed notes, independently viewed imaging studies, participated in medical decision making and plan of care.ROS completed by me personally and pertinent positives fully documented  I have made any additions or clarifications directly to the above note. Agree with note above.   She presented with sudden onset of painless loss of vision in the left eye from central retinal artery occlusion.  Etiology unclear as to embolic occlusion versus medium vessel atherosclerosis.  Recommend aspirin and Plavix for 3 weeks followed by aspirin alone.  Check transesophageal echocardiogram to evaluate for right atrial hypodensity.  She may need loop recorder later.  Discussed with Dr. Ree Kida.  Greater than 50% time during this 35-minute visit were spent on counseling and coordination of care about her vision loss and answering questions Antony Contras, MD Medical Director [place] Pager: [phone] 05/25/2019 2:26 PM  To contact Stroke Continuity provider, please refer to [email or web address] After hours, contact General Neurology

## 2019-05-25 NOTE — Progress Notes (Signed)
Carotid artery duplex completed.  05/25/2019 11:13 AM Maudry Mayhew, MHA, RVT, RDCS, RDMS

## 2019-05-25 NOTE — Discharge Summary (Signed)
Physician Discharge Summary  Elaine Turner GMW:[id number] DOB: 1946/08/08 DOA: 05/24/2019  PCP: Biagio Borg, MD  Admit date: 05/24/2019 Discharge date: 05/25/2019  Time spent: 45 minutes  Recommendations for Outpatient Follow-up:  Patient will be discharged to home.  Patient will need to follow up with primary care provider within one week of discharge, repeat BMP.  Follow up with neurology. Follow up with cardiology for TEE. Follow up with Dr. Katy Fitch, ophthalmology. Patient should continue medications as prescribed.  Patient should follow a heart healthy diet.   Discharge Diagnoses:  Left Central Retinal Artery Occlusion Essential Hypertension Hyperlipidemia Atrial fibrillation Chronic kidney disease, stage III Tobacco dependence Hypokalemia  Discharge Condition: Stable  Diet recommendation: heart healthy  Filed Weights   05/24/19 1032  Weight: 63.5 kg    History of present illness:  On 05/24/2019 by Dr. Marijean Bravo Branchis a 73 y.o.femalewith medical history significant ofPVD; HTN; hyperglycemia; COPD; AIN III of internal hemorrhoids; and aortic atherosclerosis presenting with concern for retinal artery occlusion after being sent in by her ophthalmologist.She reports problems with both eyes last night and they said she had a stroke in the left eye. She noticed problems while watching tv last night about 7pm. Her eyes "just gave out" - she was having floaters and seeing spots and then she couldn't see anything out of her left eye. No N/W/T of arms/legs, no dysarthria, dysphagia. No h/o CVA. The vision is blurred in that eye now, but her vision is fine on the right.  Hospital Course:  Left Central Retinal Artery Occlusion -Patient with acute onset of L vision loss- currently improving some -Sent from Dr. Katy Fitch, ophthalmology  -MRI brain negative for acute infarct.  Moderate chronic microvascular ischemic change in the white matter. -MRA: Atherosclerotic  irregularity and mild stenosis of the cavernous carotid bilaterally.  No large vessel occlusion -Neurology consulted and appreciated, pending recommendations -LDL 78, hemoglobin A1c 5.5 -Carotid Doppler showed bilateral ICA 1 to 39% stenosis.  Bilateral vertebral arteries demonstrate antegrade flow. -Echocardiogram showed an EF 60 to 65%.  LV diastolic Doppler parameters consistent with impaired relaxation.  Mobile echodensity noted on the RA side of the interatrial septum, 1.28 cm x 0.611 cm.  No ASD or PFO. -Cardiology consulted for TEE- however this will not occur until at least 05/29/2019- will complete as an outpatient -s/p loop recorder placement  -PT/OT evaluated patient, no further therapy recommended -Placed on Plavix, aspirin, statin  Essential Hypertension -Initially allowing for permissive hypertension given possible CVA -Home medications held  Hyperlipidemia -Lipid panel: Total cholesterol 144, HDL 52, LDL 78, triglycerides 69 -Placed on statin empirically  Atrial fibrillation -She reports h/o irregular heart rate but is not familiar with the term "afib" -Not sure why she is not on anticoagulation   Chronic kidney disease, stage III -Creatinine currently appears to be better than baseline -continue to monitor BMP  Tobacco dependence -discussed smoking cessation -continue nicotine patch  Hypokalemia -Replaced, continue to monitor BMP  Consultants Neurology Cardiology  Procedures  Echocardiogram Carotid doppler Placement of loop recorder  Discharge Exam: Vitals:   05/25/19 1300 05/25/19 1637  BP: 126/83 (!) 169/75  Pulse: 72 71  Resp: (!) 21 16  Temp: 98.4 F (36.9 C) 98.7 F (37.1 C)  SpO2: 98% 98%     General: Well developed, well nourished, NAD, appears stated age  HEENT: NCAT, PERRLA, EOMI, Anicteic Sclera, mucous membranes moist.  Neck: Supple  Cardiovascular: S1 S2 auscultated, RRR  Respiratory: Clear to auscultation  bilaterally  with equal chest rise  Abdomen: Soft, nontender, nondistended, + bowel sounds  Extremities: warm dry without cyanosis clubbing or edema  Neuro: AAOx3, mild vision loss- L, otherwise nonfocal  Psych: Normal affect and demeanor   Discharge Instructions Discharge Instructions    Discharge instructions   Complete by:  As directed    Patient will be discharged to home.  Patient will need to follow up with primary care provider within one week of discharge, repeat BMP.  Follow up with neurology. Follow up with cardiology for TEE. Follow up with Dr. Katy Fitch, ophthalmology. Patient should continue medications as prescribed.  Patient should follow a heart healthy diet.     Allergies as of 05/25/2019      Reactions   Ace Inhibitors Cough   Levaquin [levofloxacin] Other (See Comments)   GI upset      Medication List    TAKE these medications   aspirin 81 MG EC tablet Take 1 tablet (81 mg total) by mouth daily. Swallow whole.   atorvastatin 40 MG tablet Commonly known as:  LIPITOR Take 1 tablet (40 mg total) by mouth daily at 6 PM.   clopidogrel 75 MG tablet Commonly known as:  PLAVIX Take 1 tablet (75 mg total) by mouth daily. Start taking on:  May 26, 2019   feeding supplement (ENSURE ENLIVE) Liqd Take 237 mLs by mouth daily at 3 pm. Start taking on:  May 26, 2019   fluticasone 50 MCG/ACT nasal spray Commonly known as:  FLONASE Place 2 sprays into both nostrils daily. What changed:    when to take this  reasons to take this   losartan 25 MG tablet Commonly known as:  COZAAR Take 1 tablet (25 mg total) by mouth 2 (two) times daily.   nicotine 14 mg/24hr patch Commonly known as:  NICODERM CQ - dosed in mg/24 hours Place 1 patch (14 mg total) onto the skin daily. Start taking on:  May 26, 2019   NIFEdipine 90 MG 24 hr tablet Commonly known as:  PROCARDIA XL/NIFEDICAL-XL TAKE 1 TABLET (90 MG TOTAL) BY MOUTH DAILY. What changed:    how much to take  how to take  this  when to take this  additional instructions   nystatin 100000 UNIT/ML suspension Commonly known as:  MYCOSTATIN Take 5 mLs (500,000 Units total) by mouth 4 (four) times daily for 10 days.   zolpidem 5 MG tablet Commonly known as:  AMBIEN Take 1 tablet (5 mg total) by mouth at bedtime as needed for sleep.      Allergies  Allergen Reactions  . Ace Inhibitors Cough  . Levaquin [Levofloxacin] Other (See Comments)    GI upset   Follow-up Information    Biagio Borg, MD. Schedule an appointment as soon as possible for a visit in 1 week(s).   Specialties:  Internal Medicine, Radiology Why:  Hospital follow up Contact information: [address] [place] [address] [phone]        [facility] Schedule an appointment as soon as possible for a visit in 1 week(s).   Why:  Hospital follow up Contact information: [address] [place] [address] [phone]        Garvin Fila, MD. Schedule an appointment as soon as possible for a visit in 4 week(s).   Specialties:  Neurology, Radiology Why:  Hospital follow up Contact information: [address] [place] [address] [phone]  [place] [facility] Follow up.   Specialty:  Cardiology Why:  06/06/2019 @ 2:30PM, wound check visit Contact information: [address], [place] [phone]           The results of significant diagnostics from this hospitalization (including imaging, microbiology, ancillary and laboratory) are listed below for reference.    Significant Diagnostic Studies: Mr Virgel [place] Contrast  Result Date: 05/24/2019 CLINICAL DATA:  Stroke. Retinal artery occlusion. Left eye vision problem. EXAM: MRI HEAD WITHOUT CONTRAST MRA HEAD WITHOUT CONTRAST TECHNIQUE: Multiplanar, multiecho pulse sequences of the brain and surrounding structures were obtained without intravenous  contrast. Angiographic images of the head were obtained using MRA technique without contrast. COMPARISON:  None. FINDINGS: MRI HEAD FINDINGS Brain: Negative for acute infarct. Ventricle size and cerebral volume normal. Moderate chronic microvascular ischemic change in the white matter and pons bilaterally. Negative for hemorrhage or mass. Vascular: Normal arterial flow voids Skull and upper cervical spine: No acute skeletal abnormality. Relatively large disc protrusion on the left at C3-4 with spinal stenosis. Smaller central disc protrusion C4-5. Limited evaluation. Sinuses/Orbits: Mucosal edema paranasal sinuses. Bilateral cataract surgery. Other: None MRA HEAD FINDINGS Both vertebral arteries patent to the basilar without stenosis. PICA patent bilaterally. Basilar widely patent. Superior cerebellar and posterior cerebral arteries patent bilaterally. Atherosclerotic irregularity in the cavernous carotid bilaterally with mild stenosis bilaterally. Anterior and middle cerebral arteries patent bilaterally without significant stenosis. IMPRESSION: 1. Negative for acute infarct. Moderate chronic microvascular ischemic change in the white matter 2. Atherosclerotic irregularity and mild stenosis in the cavernous carotid bilaterally. No large vessel occlusion. 3. Disc protrusions C3-4 and C4-5 Electronically Signed   By: Franchot Gallo M.D.   On: 05/24/2019 15:27   Mr Brain Wo Contrast  Result Date: 05/24/2019 CLINICAL DATA:  Stroke. Retinal artery occlusion. Left eye vision problem. EXAM: MRI HEAD WITHOUT CONTRAST MRA HEAD WITHOUT CONTRAST TECHNIQUE: Multiplanar, multiecho pulse sequences of the brain and surrounding structures were obtained without intravenous contrast. Angiographic images of the head were obtained using MRA technique without contrast. COMPARISON:  None. FINDINGS: MRI HEAD FINDINGS Brain: Negative for acute infarct. Ventricle size and cerebral volume normal. Moderate chronic microvascular ischemic  change in the white matter and pons bilaterally. Negative for hemorrhage or mass. Vascular: Normal arterial flow voids Skull and upper cervical spine: No acute skeletal abnormality. Relatively large disc protrusion on the left at C3-4 with spinal stenosis. Smaller central disc protrusion C4-5. Limited evaluation. Sinuses/Orbits: Mucosal edema paranasal sinuses. Bilateral cataract surgery. Other: None MRA HEAD FINDINGS Both vertebral arteries patent to the basilar without stenosis. PICA patent bilaterally. Basilar widely patent. Superior cerebellar and posterior cerebral arteries patent bilaterally. Atherosclerotic irregularity in the cavernous carotid bilaterally with mild stenosis bilaterally. Anterior and middle cerebral arteries patent bilaterally without significant stenosis. IMPRESSION: 1. Negative for acute infarct. Moderate chronic microvascular ischemic change in the white matter 2. Atherosclerotic irregularity and mild stenosis in the cavernous carotid bilaterally. No large vessel occlusion. 3. Disc protrusions C3-4 and C4-5 Electronically Signed   By: Franchot Gallo M.D.   On: 05/24/2019 15:27   Vas US Carotid  Result Date: 05/25/2019 Carotid Arterial Duplex Study Indications:  Visual disturbance. Risk Factors: Hypertension. Performing Technologist: Maudry Mayhew MHA, RDMS, RVT, RDCS  Examination Guidelines: A complete evaluation includes B-mode imaging, spectral Doppler, color Doppler, and power Doppler as needed of all accessible portions of each vessel. Bilateral testing is considered an integral part of a complete examination. Limited  examinations for reoccurring indications may be performed as noted.  Right Carotid Findings: +----------+--------+--------+--------+-----------------------+--------+           PSV cm/sEDV cm/sStenosisDescribe               Comments +----------+--------+--------+--------+-----------------------+--------+ CCA Prox  122     7                                                +----------+--------+--------+--------+-----------------------+--------+ CCA Distal100     7               smooth and heterogenous         +----------+--------+--------+--------+-----------------------+--------+ ICA Prox  45      7               smooth and heterogenous         +----------+--------+--------+--------+-----------------------+--------+ ICA Distal72      13                                              +----------+--------+--------+--------+-----------------------+--------+ ECA       123     5               smooth and heterogenous         +----------+--------+--------+--------+-----------------------+--------+ +----------+--------+-------+----------------+-------------------+           PSV cm/sEDV cmsDescribe        Arm Pressure (mmHG) +----------+--------+-------+----------------+-------------------+ [id number]            Multiphasic, WNL                    +----------+--------+-------+----------------+-------------------+ +---------+--------+--+--------+-+---------+ VertebralPSV cm/s42EDV cm/s6Antegrade +---------+--------+--+--------+-+---------+  Left Carotid Findings: +----------+--------+-------+--------+--------------------------------+--------+           PSV cm/sEDV    StenosisDescribe                        Comments                   cm/s                                                    +----------+--------+-------+--------+--------------------------------+--------+ CCA Prox  132     17             smooth, heterogenous and                                                  calcific                                 +----------+--------+-------+--------+--------------------------------+--------+ CCA Distal103     17             smooth and heterogenous                  +----------+--------+-------+--------+--------------------------------+--------+ ICA Prox  83      19  smooth and  heterogenous                  +----------+--------+-------+--------+--------------------------------+--------+ ICA Distal94      24                                                      +----------+--------+-------+--------+--------------------------------+--------+ ECA       109                                                             +----------+--------+-------+--------+--------------------------------+--------+ +----------+--------+--------+----------------+-------------------+ SubclavianPSV cm/sEDV cm/sDescribe        Arm Pressure (mmHG) +----------+--------+--------+----------------+-------------------+           144             Multiphasic, WNL                    +----------+--------+--------+----------------+-------------------+ +---------+--------+---+--------+-+---------+ VertebralPSV cm/s181EDV cm/s6Antegrade +---------+--------+---+--------+-+---------+  Summary: Right Carotid: Velocities in the right ICA are consistent with a 1-39% stenosis. Left Carotid: Velocities in the left ICA are consistent with a 1-39% stenosis. Vertebrals:  Bilateral vertebral arteries demonstrate antegrade flow. Subclavians: Normal flow hemodynamics were seen in bilateral subclavian              arteries. *See table(s) above for measurements and observations.  Electronically signed by Servando Snare MD on 05/25/2019 at 12:39:31 PM.    Final     Microbiology: Recent Results (from the past 240 hour(s))  SARS Coronavirus 2 (CEPHEID - Performed in [facility] hospital lab), Hosp Order     Status: None   Collection Time: 05/24/19  1:36 PM  Result Value Ref Range Status   SARS Coronavirus 2 NEGATIVE NEGATIVE Final    Comment: (NOTE) If result is NEGATIVE SARS-CoV-2 target nucleic acids are NOT DETECTED. The SARS-CoV-2 RNA is generally detectable in upper and lower  respiratory specimens during the acute phase of infection. The lowest  concentration of SARS-CoV-2 viral copies this assay  can detect is 250  copies / mL. A negative result does not preclude SARS-CoV-2 infection  and should not be used as the sole basis for treatment or other  patient management decisions.  A negative result may occur with  improper specimen collection / handling, submission of specimen other  than nasopharyngeal swab, presence of viral mutation(s) within the  areas targeted by this assay, and inadequate number of viral copies  (<250 copies / mL). A negative result must be combined with clinical  observations, patient history, and epidemiological information. If result is POSITIVE SARS-CoV-2 target nucleic acids are DETECTED. The SARS-CoV-2 RNA is generally detectable in upper and lower  respiratory specimens dur ing the acute phase of infection.  Positive  results are indicative of active infection with SARS-CoV-2.  Clinical  correlation with patient history and other diagnostic information is  necessary to determine patient infection status.  Positive results do  not rule out bacterial infection or co-infection with other viruses. If result is PRESUMPTIVE POSTIVE SARS-CoV-2 nucleic acids MAY BE PRESENT.   A presumptive positive result was obtained on the submitted specimen  and confirmed on repeat testing.  While 2019  novel coronavirus  (SARS-CoV-2) nucleic acids may be present in the submitted sample  additional confirmatory testing may be necessary for epidemiological  and / or clinical management purposes  to differentiate between  SARS-CoV-2 and other Sarbecovirus currently known to infect humans.  If clinically indicated additional testing with an alternate test  methodology [phone]) is advised. The SARS-CoV-2 RNA is generally  detectable in upper and lower respiratory sp ecimens during the acute  phase of infection. The expected result is Negative. Fact Sheet for Patients:  StrictlyIdeas.no Fact Sheet for Healthcare  Providers: BankingDealers.co.za This test is not yet approved or cleared by the [place] FDA and has been authorized for detection and/or diagnosis of SARS-CoV-2 by FDA under an Emergency Use Authorization (EUA).  This EUA will remain in effect (meaning this test can be used) for the duration of the COVID-19 declaration under Section 564(b)(1) of the Act, 21 U.S.C. section 360bbb-3(b)(1), unless the authorization is terminated or revoked sooner. Performed at [facility], [place] [address] [place], [place] [address]      Labs: Basic Metabolic Panel: Recent Labs  Lab 05/24/19 1105 05/25/19 0401  NA 137 142  K 3.1* 3.1*  CL 104 105  CO2 20* 24  GLUCOSE 101* 79  BUN 10 10  CREATININE 1.16* 1.11*  CALCIUM 9.1 9.1   Liver Function Tests: Recent Labs  Lab 05/24/19 1105  AST 13*  ALT 12  ALKPHOS 90  BILITOT 0.9  PROT 6.8  ALBUMIN 3.5   No results for input(s): LIPASE, AMYLASE in the last 168 hours. No results for input(s): AMMONIA in the last 168 hours. CBC: Recent Labs  Lab 05/24/19 1105  WBC 10.3  NEUTROABS 8.1*  HGB 14.9  HCT 49.1*  MCV 75.8*  PLT 409*   Cardiac Enzymes: No results for input(s): CKTOTAL, CKMB, CKMBINDEX, TROPONINI in the last 168 hours. BNP: BNP (last 3 results) No results for input(s): BNP in the last 8760 hours.  ProBNP (last 3 results) No results for input(s): PROBNP in the last 8760 hours.  CBG: No results for input(s): GLUCAP in the last 168 hours.     Signed:  Cristal Ford  [facility] 05/25/2019, 7:29 PM

## 2019-05-26 ENCOUNTER — Encounter (HOSPITAL_COMMUNITY): Payer: Self-pay | Admitting: Cardiology

## 2019-05-26 ENCOUNTER — Telehealth: Payer: Self-pay | Admitting: *Deleted

## 2019-05-26 NOTE — Telephone Encounter (Signed)
Tried calling pt to make hosp f./u appt there was no answer and no vm. Will retry later...Elaine Turner

## 2019-05-29 DIAGNOSIS — H35371 Puckering of macula, right eye: Secondary | ICD-10-CM | POA: Diagnosis not present

## 2019-05-29 DIAGNOSIS — H10413 Chronic giant papillary conjunctivitis, bilateral: Secondary | ICD-10-CM | POA: Diagnosis not present

## 2019-05-29 DIAGNOSIS — H35372 Puckering of macula, left eye: Secondary | ICD-10-CM | POA: Diagnosis not present

## 2019-05-29 DIAGNOSIS — Z961 Presence of intraocular lens: Secondary | ICD-10-CM | POA: Diagnosis not present

## 2019-05-29 DIAGNOSIS — H3412 Central retinal artery occlusion, left eye: Secondary | ICD-10-CM | POA: Diagnosis not present

## 2019-05-29 NOTE — Telephone Encounter (Signed)
Called pt back concerning hosp f/u appt that's been scheduled for 05/30/19. Pt states yes her daughter called back and made appt for her. Inform pt had some additional questions concerning D/c. Completed TCM call below.Elaine Turner  Transition Care Management Follow-up Telephone Call   Date discharged? 05/25/19   How have you been since you were released from the hospital? Pt states she seem to be doing fine   Do you understand why you were in the hospital? YES   Do you understand the discharge instructions? YES   Where were you discharged to? Home   Items Reviewed:  Medications reviewed: YES, pt states her medicines all the same  Allergies reviewed: YES  Dietary changes reviewed: YES, heart healthy  Referrals reviewed: YES, she states will see Dr. Katy Turner next week   Functional Questionnaire:   Activities of Daily Living (ADLs):   She states she are independent in the following: ambulation, bathing and hygiene, feeding, continence, grooming, toileting and dressing States she doesn't require assistance    Any transportation issues/concerns?: NO   Any patient concerns? NO   Confirmed importance and date/time of follow-up visits scheduled YES, appt 05/30/19  Provider Appointment booked with Dr. Jenny Turner  Confirmed with patient if condition begins to worsen call PCP or go to the ER.  Patient was given the office number and encouraged to call back with question or concerns.  : YES

## 2019-05-30 ENCOUNTER — Inpatient Hospital Stay: Payer: Medicare Other | Admitting: Internal Medicine

## 2019-05-31 ENCOUNTER — Telehealth: Payer: Self-pay | Admitting: Internal Medicine

## 2019-05-31 ENCOUNTER — Ambulatory Visit (INDEPENDENT_AMBULATORY_CARE_PROVIDER_SITE_OTHER)
Admission: RE | Admit: 2019-05-31 | Discharge: 2019-05-31 | Disposition: A | Discharge status: Home / Self Care | Payer: Medicare Other | Source: Ambulatory Visit | Attending: Internal Medicine | Admitting: Internal Medicine

## 2019-05-31 ENCOUNTER — Encounter: Payer: Self-pay | Admitting: Internal Medicine

## 2019-05-31 ENCOUNTER — Other Ambulatory Visit: Payer: Self-pay

## 2019-05-31 ENCOUNTER — Ambulatory Visit (INDEPENDENT_AMBULATORY_CARE_PROVIDER_SITE_OTHER): Payer: Medicare Other | Admitting: Internal Medicine

## 2019-05-31 VITALS — BP 140/74 | HR 87 | Temp 97.9°F | Ht 62.0 in | Wt 130.0 lb

## 2019-05-31 DIAGNOSIS — M545 Low back pain, unspecified: Secondary | ICD-10-CM | POA: Insufficient documentation

## 2019-05-31 DIAGNOSIS — G8929 Other chronic pain: Secondary | ICD-10-CM

## 2019-05-31 DIAGNOSIS — J449 Chronic obstructive pulmonary disease, unspecified: Secondary | ICD-10-CM

## 2019-05-31 DIAGNOSIS — M5136 Other intervertebral disc degeneration, lumbar region: Secondary | ICD-10-CM | POA: Diagnosis not present

## 2019-05-31 DIAGNOSIS — H3412 Central retinal artery occlusion, left eye: Secondary | ICD-10-CM

## 2019-05-31 DIAGNOSIS — I517 Cardiomegaly: Secondary | ICD-10-CM | POA: Diagnosis not present

## 2019-05-31 DIAGNOSIS — R634 Abnormal weight loss: Secondary | ICD-10-CM

## 2019-05-31 IMAGING — DX LUMBAR SPINE - COMPLETE 4+ VIEW
5 series · 5 of 5 positions shown · non-contrast
Comparison: [DATE]

CLINICAL DATA: Low back pain over the last week.

EXAM:
LUMBAR SPINE - COMPLETE 4+ VIEW

[l-spine ap]
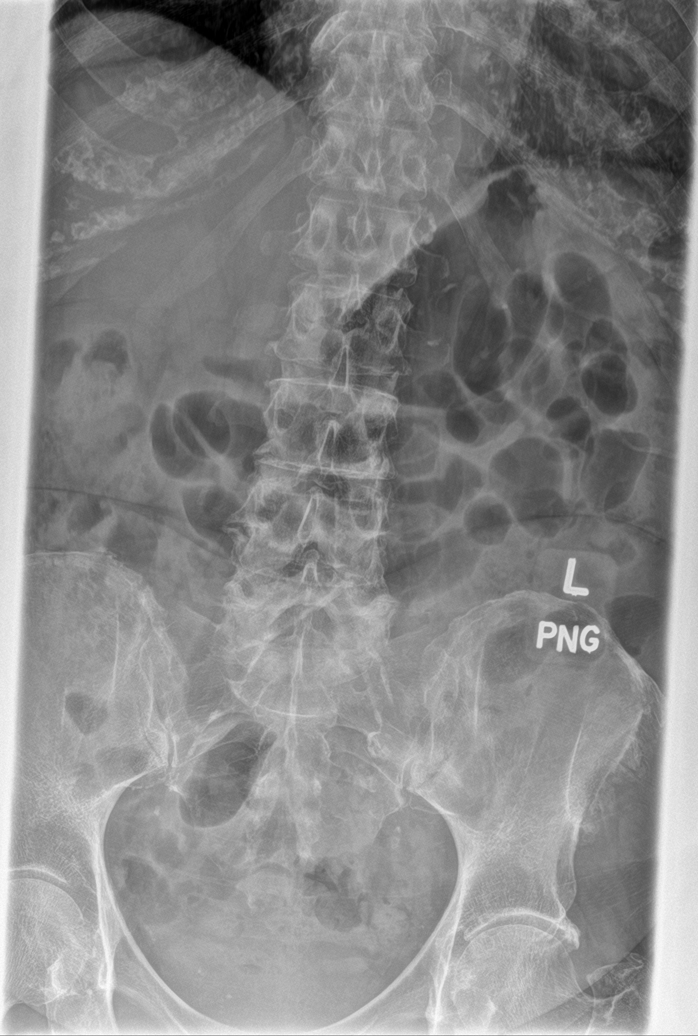

[l-spine obl (1 of 2)]
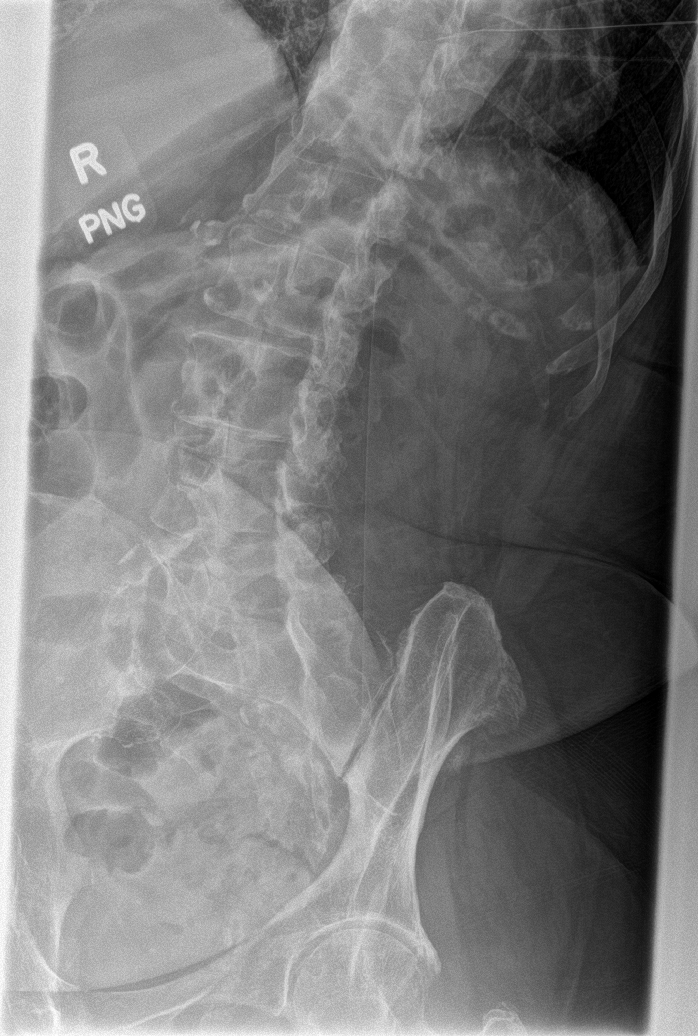

[l-spine obl (2 of 2)]
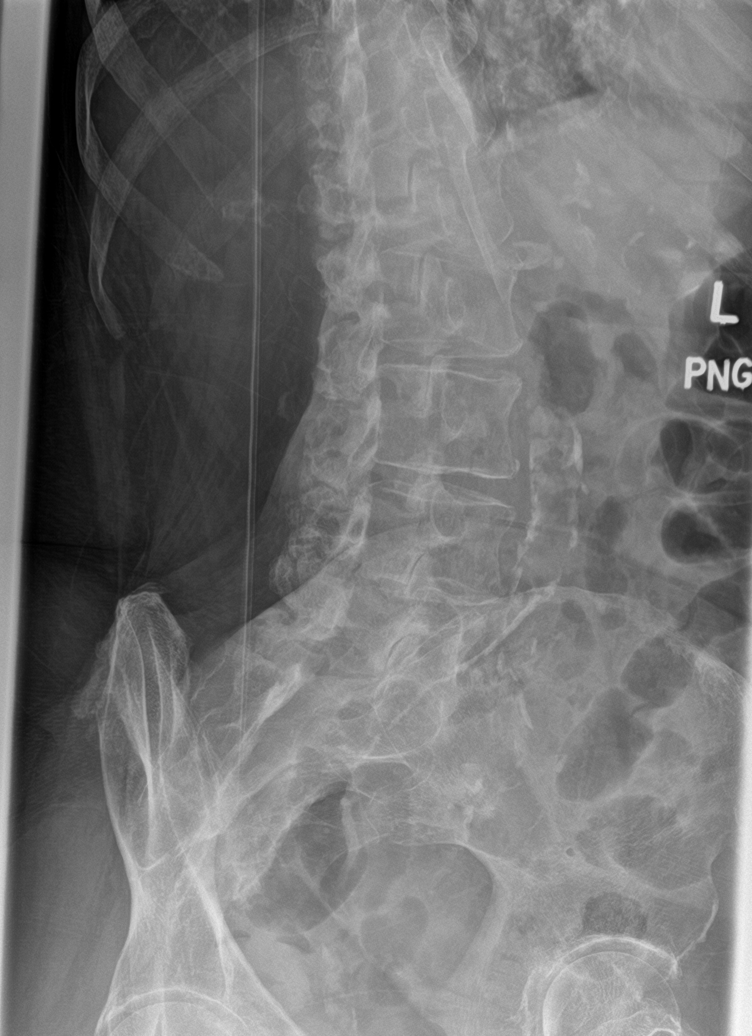

[l-spine lat]
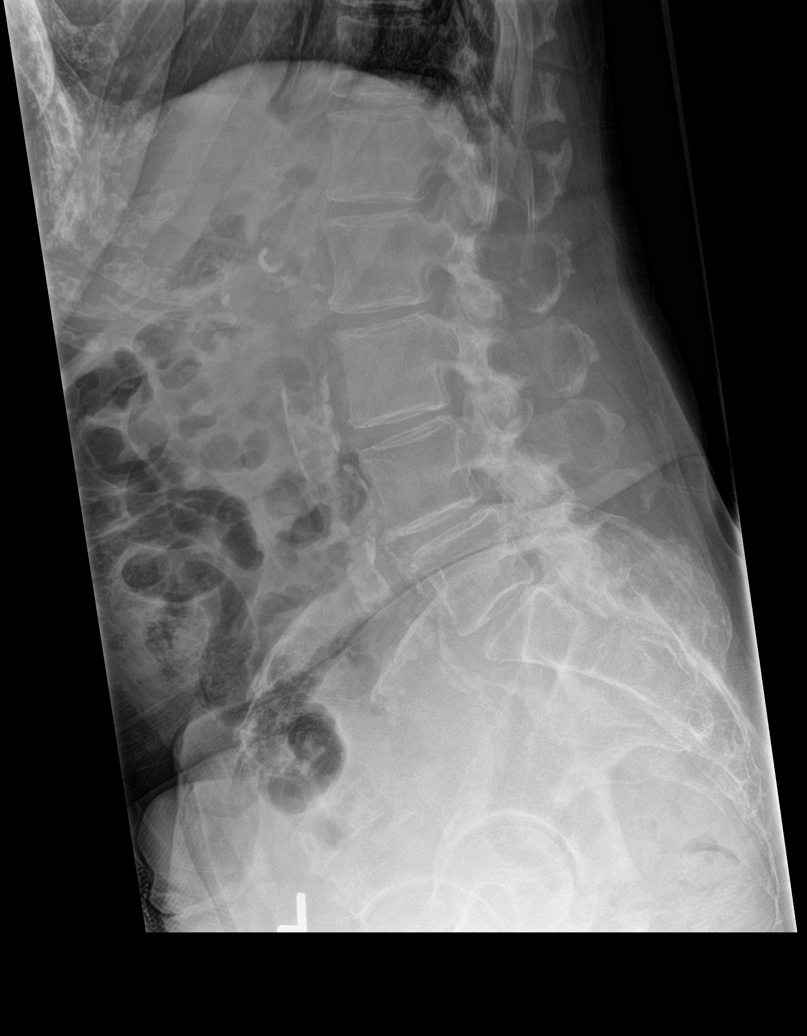

[l-spine spot]
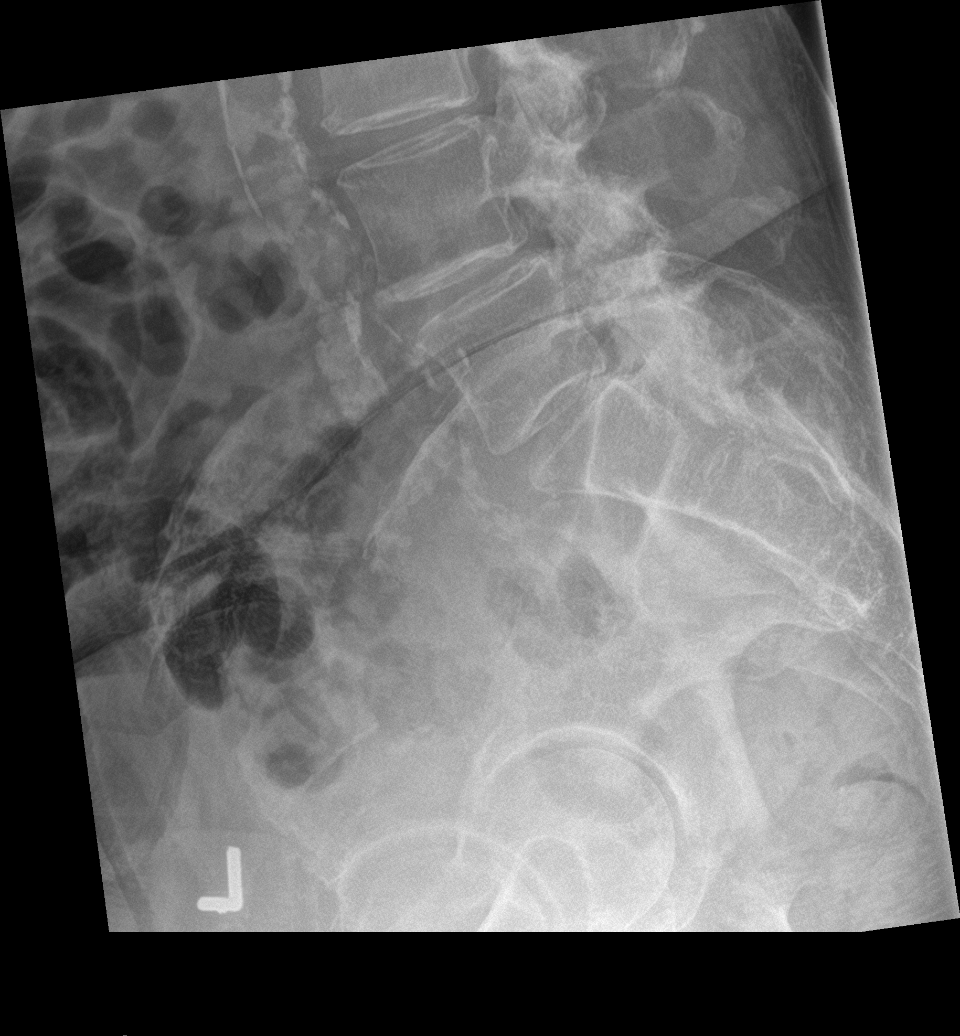

[5 of 5 positions shown; findings below may reference images not displayed]

FINDINGS: Normal alignment. Mild disc space narrowing throughout the lumbar
region without advanced disc space narrowing. Lower lumbar facet
arthropathy which could be a cause of back pain. No fracture or
focal lesion. Arterial calcification is present.
IMPRESSION: No acute finding. Ordinary lumbar disc space narrowing. Lower lumbar
facet osteoarthritis which could be symptomatic.

## 2019-05-31 IMAGING — DX CHEST - 2 VIEW
2 series · 2 of 2 positions shown · non-contrast
Comparison: [DATE]

CLINICAL DATA: Recent weight loss.

EXAM:
CHEST - 2 VIEW

[chest pa]
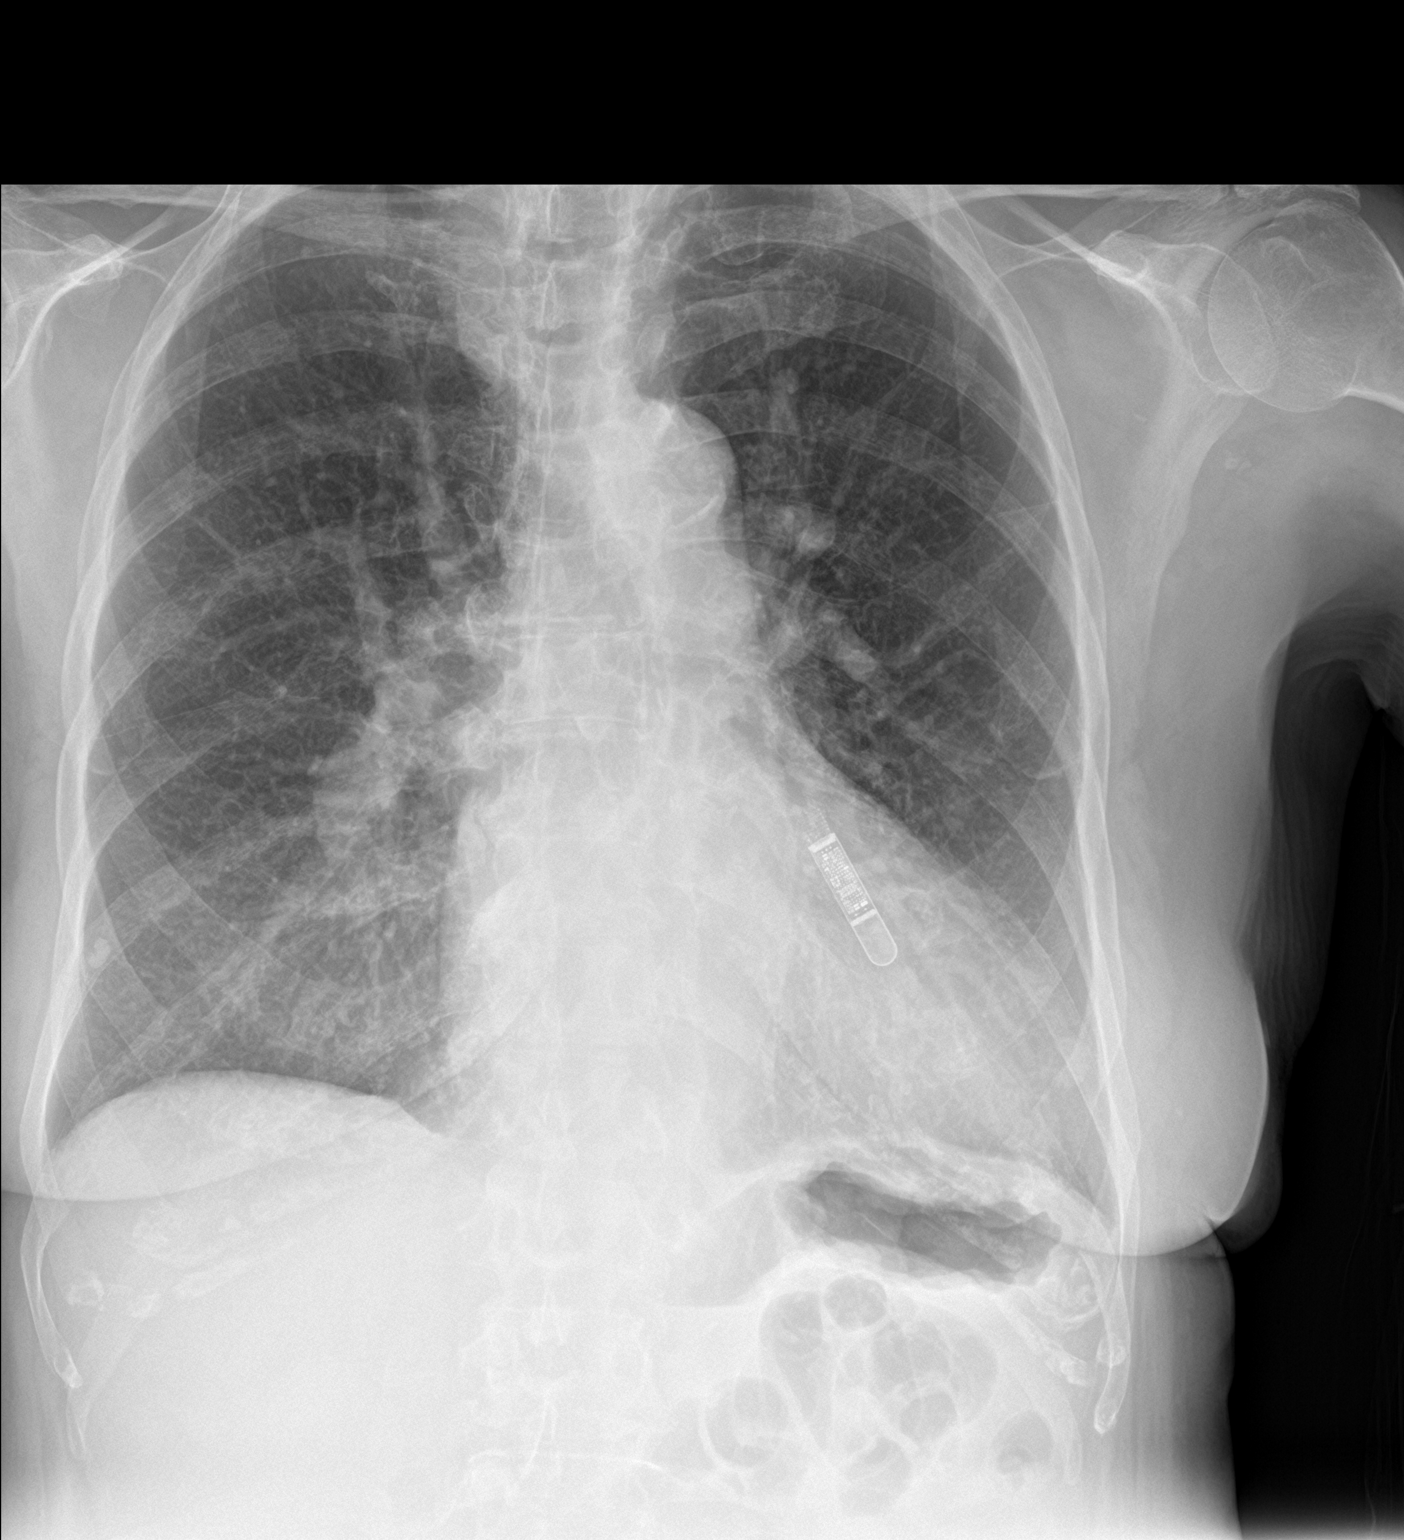

[chest lat]
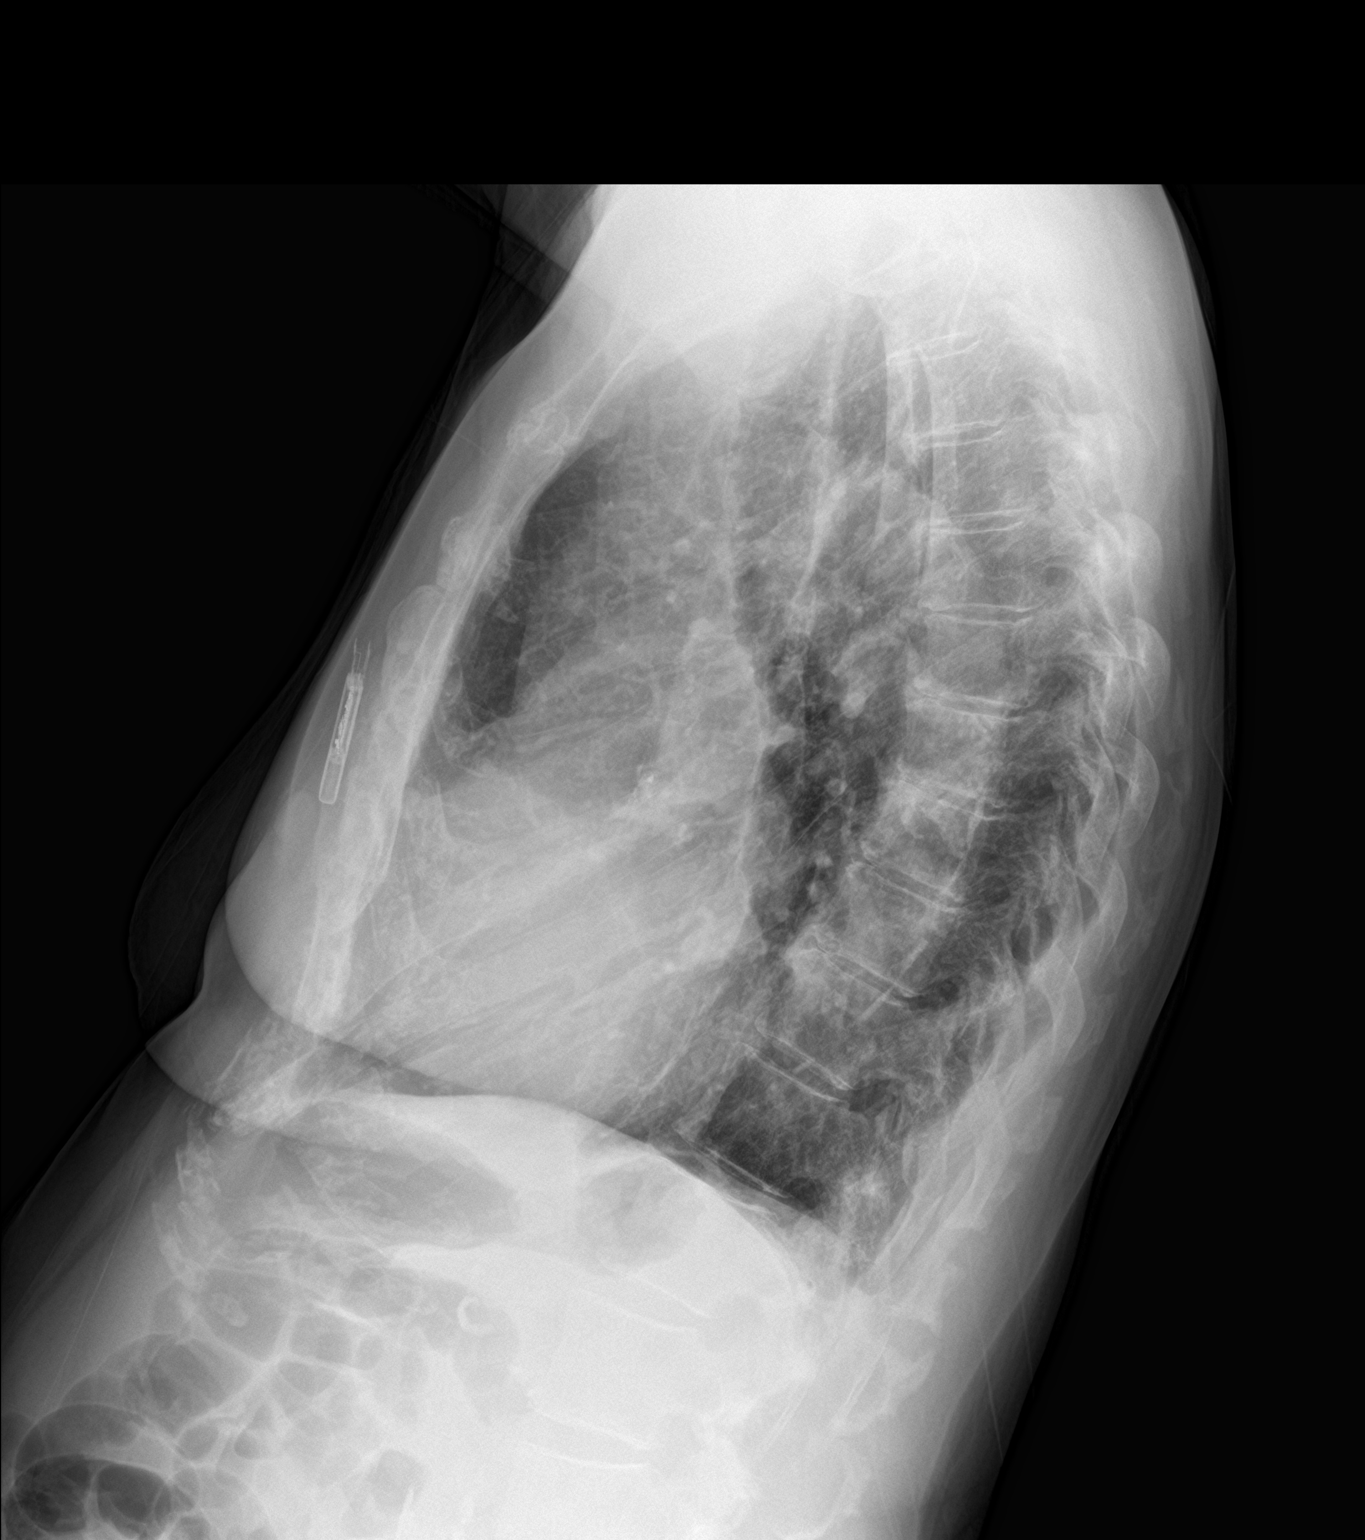

[2 of 2 positions shown; findings below may reference images not displayed]

FINDINGS: Cardiomegaly. Aortic atherosclerosis. Loop recorder projects over
chest. There may be pulmonary venous hypertension but there is no
frank edema. No effusions. Bilateral nipple shadows. Benign breast
calcification projects over the right lower lung. Ordinary mild
degenerative changes affect the spine.
IMPRESSION: Cardiomegaly. Aortic atherosclerosis. Possible pulmonary venous
hypertension without frank edema or effusions.

## 2019-05-31 MED ORDER — TIZANIDINE HCL 2 MG PO TABS: 2.0000 mg | ORAL_TABLET | Freq: Four times a day (QID) | ORAL | 2 refills | Status: DC | PRN

## 2019-05-31 MED ORDER — TRAMADOL HCL 50 MG PO TABS: 50.0000 mg | ORAL_TABLET | Freq: Four times a day (QID) | ORAL | 0 refills | Status: DC | PRN

## 2019-05-31 MED ORDER — HYDROCODONE-ACETAMINOPHEN 5-325 MG PO TABS: 1.0000 | ORAL_TABLET | Freq: Four times a day (QID) | ORAL | 0 refills | Status: DC | PRN

## 2019-05-31 NOTE — Telephone Encounter (Signed)
Attempted to contact pt, no VM, phone continuously rings.

## 2019-05-31 NOTE — Assessment & Plan Note (Signed)
Etiology unclear, for cxr 

## 2019-05-31 NOTE — Telephone Encounter (Signed)
Copied from [id number] [phone]. Topic: Quick Communication - Rx Refill/Question >> May 31, 2019 12:26 PM Nils Flack, Marland Kitchen wrote: Medication: something different than tramadol - pt says that it does not help.   Has the patient contacted their pharmacy? No. (Agent: If no, request that the patient contact the pharmacy for the refill.) (Agent: If yes, when and what did the pharmacy advise?)  Preferred Pharmacy (with phone number or street name): [facility] [address]   Agent: Please be advised that RX refills may take up to 3 business days. We ask that you follow-up with your pharmacy.

## 2019-05-31 NOTE — Telephone Encounter (Signed)
Pt has been informed of new med sent to pharmacy.

## 2019-05-31 NOTE — Assessment & Plan Note (Signed)
Mild to mod recurrent, likely underlying lumbar djd/ddd, for pain control,muscle relaxer rpn, for films today

## 2019-05-31 NOTE — Patient Instructions (Signed)
Please take all new medication as prescribed - the tramadol as needed for pain, and the muscle relaxer as needed  Please continue all other medications as before, and refills have been done if requested.  Please have the pharmacy call with any other refills you may need.  Please continue your efforts at being more active, low cholesterol diet, and weight control.  Please keep your appointments with your specialists as you may have planned  Please go to the XRAY Department in the Basement (go straight as you get off the elevator) for the x-ray testing  You will be contacted by phone if any changes need to be made immediately.  Otherwise, you will receive a letter about your results with an explanation, but please check with MyChart first.  Please remember to sign up for MyChart if you have not done so, as this will be important to you in the future with finding out test results, communicating by private email, and scheduling acute appointments online when needed.

## 2019-05-31 NOTE — Telephone Encounter (Signed)
[place] for change to hydrocodone prn - done erx

## 2019-05-31 NOTE — Addendum Note (Signed)
Addended by: Biagio Borg on: 05/31/2019 01:10 PM   Modules accepted: Orders

## 2019-05-31 NOTE — Progress Notes (Signed)
Subjective:    Patient ID: Elaine Turner, female    DOB: 09/19/1946, 73 y.o.   MRN: [id number]  HPI  73 y.o.femalewith medical history significant ofPVD; HTN; hyperglycemia; COPD; AIN III of internal hemorrhoids; and aortic atherosclerosis presenting with concern for retinal artery occlusion after being sent in by her ophthalmologist.  She reported problems with both eyes the prio night and they said she had a stroke in the left eye. She noticed problems while watching tv, Her eyes "just gave out" - she was having floaters and seeing spots and then she couldn't see anything out of her left eye. No focal neuro signs of arms/legs, no dysarthria, dysphagia. No h/o CVA. The vision is blurred in that eye now, but her vision was fine on the right. In hospital: MRI brain negative for acute infarct.  Moderate chronic microvascular ischemic change in the white matter. -MRA: Atherosclerotic irregularity and mild stenosis of the cavernous carotid bilaterally.  No large vessel occlusion -Neurology consulted and appreciated, pending recommendations -LDL 78, hemoglobin A1c 5.5 -Carotid Doppler showed bilateral ICA 1 to 39% stenosis.  Bilateral vertebral arteries demonstrate antegrade flow. -Echocardiogram showed an EF 60 to 65%.  LV diastolic Doppler parameters consistent with impaired relaxation.  Mobile echodensity noted on the RA side of the interatrial septum, 1.28 cm x 0.611 cm.  No ASD or PFO. -Cardiology consulted for TEE -PT/OT evaluated patient, no further therapy recommended -Placed on Plavix, aspirin, statin  Has lost significant wt, states she has no taste or smell, COVID neg 5/27, denies fever, ST, Cough, sob.  ? Stress related, Denies worsening depressive symptoms, suicidal ideation, or panic; has ongoing anxiety Wt Readings from Last 3 Encounters:  05/31/19 130 lb (59 kg)  05/24/19 140 lb (63.5 kg)  04/18/19 140 lb (63.5 kg)  Pt continues to have recurring LBP without change in  severity, bowel or bladder change, fever, wt loss,  worsening LE pain/numbness/weakness, gait change or falls. Past Medical History:  Diagnosis Date  . 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 Aortic atherosclerosis ([place])   . Arthritis   . Chronic diarrhea   . COPD  GOLD 0 / active smoker  10/24/2015  . Diverticulitis   . Diverticulosis   . Esophageal stricture   . Heart murmur   . Hemorrhoids   . Hyperglycemia 05/01/2013  . Hypertension   . 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 Pneumonia 05/01/2013  . PVD (peripheral vascular disease) ([place])   . Urine incontinence    Past Surgical History:  Procedure Laterality Date  . ABDOMINAL HYSTERECTOMY    . HEMORRHOID SURGERY N/A 06/26/2015   Procedure: INTERNAL AND EXTERNAL HEMORRHOIDECTOMY;  Surgeon: Georganna Skeans, MD;  Location: [facility];  Service: General;  Laterality: N/A;  . LOOP RECORDER INSERTION N/A 05/25/2019   Procedure: LOOP RECORDER INSERTION;  Surgeon: Constance Haw, MD;  Location: [facility];  Service: Cardiovascular;  Laterality: N/A;    reports that she has been smoking cigarettes. She has a 62.00 pack-year smoking history. She has never used smokeless tobacco. She reports current alcohol use. She reports that she does not use drugs. family history includes Hypertension in her mother; Lung cancer in her father. Allergies  Allergen Reactions  . Ace Inhibitors Cough  . Levaquin [Levofloxacin] Other (See Comments)    GI upset   Current Outpatient Medications on File Prior to Visit  Medication Sig Dispense Refill  .  aspirin 81 MG EC tablet Take 1 tablet (81 mg total) by mouth daily. Swallow whole. 30 tablet 12  . atorvastatin (LIPITOR) 40 MG tablet Take 1 tablet (40 mg total) by mouth daily at 6 PM. 30 tablet 0  . clopidogrel (PLAVIX) 75 MG tablet Take 1 tablet (75 mg total) by mouth daily. 30 tablet 0  . feeding supplement,  ENSURE ENLIVE, (ENSURE ENLIVE) LIQD Take 237 mLs by mouth daily at 3 pm. 237 mL 12  . fluticasone (FLONASE) 50 MCG/ACT nasal spray Place 2 sprays into both nostrils daily. (Patient taking differently: Place 2 sprays into both nostrils as needed for rhinitis. ) 16 g 6  . losartan (COZAAR) 25 MG tablet Take 1 tablet (25 mg total) by mouth 2 (two) times daily. 180 tablet 1  . nicotine (NICODERM CQ - DOSED IN MG/24 HOURS) 14 mg/24hr patch Place 1 patch (14 mg total) onto the skin daily. 28 patch 0  . NIFEdipine (PROCARDIA XL/NIFEDICAL-XL) 90 MG 24 hr tablet TAKE 1 TABLET (90 MG TOTAL) BY MOUTH DAILY. (Patient taking differently: Take 90 mg by mouth daily. ) 90 tablet 1  . zolpidem (AMBIEN) 5 MG tablet Take 1 tablet (5 mg total) by mouth at bedtime as needed for sleep. 30 tablet 5   No current facility-administered medications on file prior to visit.    Review of Systems  Constitutional: Negative for other unusual diaphoresis or sweats HENT: Negative for ear discharge or swelling Eyes: Negative for other worsening visual disturbances Respiratory: Negative for stridor or other swelling  Gastrointestinal: Negative for worsening distension or other blood Genitourinary: Negative for retention or other urinary change Musculoskeletal: Negative for other MSK pain or swelling Skin: Negative for color change or other new lesions Neurological: Negative for worsening tremors and other numbness  Psychiatric/Behavioral: Negative for worsening agitation or other fatigue All other system neg per pt    Objective:   Physical Exam BP 140/74   Pulse 87   Temp 97.9 F (36.6 C) (Oral)   Ht 5\' 2"  (1.575 m)   Wt 130 lb (59 kg)   SpO2 94%   BMI 23.78 kg/m  VS noted,  Constitutional: Pt appears in NAD HENT: Head: NCAT.  Right Ear: External ear normal.  Left Ear: External ear normal.  Eyes: . Pupils are equal, round, and reactive to light. Conjunctivae and EOM are normal Nose: without d/c or deformity Neck:  Neck supple. Gross normal ROM Cardiovascular: Normal rate and regular rhythm.   Pulmonary/Chest: Effort normal and breath sounds without rales or wheezing.  Abd:  Soft, NT, ND, + BS, no organomegaly Neurological: Pt is alert. At baseline orientation, motor grossly intact, has low vision left eye o/w CN 2-12 intact, Skin: Skin is warm. No rashes, other new lesions, no LE edema Psychiatric: Pt behavior is normal without agitation , mild nervous No other exam findings Lab Results  Component Value Date   WBC 10.3 05/24/2019   HGB 14.9 05/24/2019   HCT 49.1 (H) 05/24/2019   PLT 409 (H) 05/24/2019   GLUCOSE 79 05/25/2019   CHOL 144 05/25/2019   TRIG 69 05/25/2019   HDL 52 05/25/2019   LDLCALC 78 05/25/2019   ALT 12 05/24/2019   AST 13 (L) 05/24/2019   NA 142 05/25/2019   K 3.1 (L) 05/25/2019   CL 105 05/25/2019   CREATININE 1.11 (H) 05/25/2019   BUN 10 05/25/2019   CO2 24 05/25/2019   TSH 0.337 (L) 05/24/2019   INR 1.1 05/24/2019  HGBA1C 5.5 05/25/2019       Assessment & Plan:

## 2019-05-31 NOTE — Telephone Encounter (Signed)
Pt called back. °

## 2019-05-31 NOTE — Assessment & Plan Note (Signed)
stable overall by history and exam, recent data reviewed with pt, and pt to continue medical treatment as before,  to f/u any worsening symptoms or concerns  

## 2019-05-31 NOTE — Assessment & Plan Note (Signed)
Encouraged to quit smoking, o/w stable

## 2019-06-01 ENCOUNTER — Telehealth: Payer: Self-pay | Admitting: Cardiology

## 2019-06-01 NOTE — Telephone Encounter (Signed)
New Message     Pt is calling and is wondering if she can shower with her Loop recorder    Please call

## 2019-06-01 NOTE — Telephone Encounter (Signed)
Follow Up:    Returiinng your call.

## 2019-06-01 NOTE — Telephone Encounter (Signed)
Attempted to call patient. No answer and unable to leave a message.   Chanetta Marshall, NP 06/01/2019 1:58 PM

## 2019-06-01 NOTE — Telephone Encounter (Signed)
Attempted to call pt back. No answer and unable to leave a message. Ok for pt to have a shower.

## 2019-06-02 NOTE — Telephone Encounter (Signed)
Spoke w/ pt and informed her that it is ok to shower. Pt verbalized understanding.

## 2019-06-05 ENCOUNTER — Telehealth: Payer: Self-pay | Admitting: Student

## 2019-06-05 NOTE — Telephone Encounter (Signed)
Attempted call to ask for remote transmission. Continuous ringing.

## 2019-06-05 NOTE — Telephone Encounter (Signed)
Pt stated she will call when she gets back home and send a transmission.

## 2019-06-05 NOTE — Telephone Encounter (Signed)
Transmission received. All AF appears to be undersensing of R waves +/- noise. Will reprogram at visit 06/06/2019.

## 2019-06-05 NOTE — Telephone Encounter (Signed)
Pt was returning [place] phone call. I told the pt Jonni Sanger will give her a call back as soon as he can. The best number to call the pt is [phone].

## 2019-06-05 NOTE — Telephone Encounter (Signed)
The pt sent manual transmission. Transmission received 06/05/2019. I informed the pt that if the nurse see anything they will give her a call back. If everything looks normal the nurse will not call. The pt verbalized understanding.

## 2019-06-05 NOTE — Telephone Encounter (Signed)
Attempted to call alternate number. LMOM to call back DC.

## 2019-06-06 ENCOUNTER — Other Ambulatory Visit: Payer: Self-pay

## 2019-06-06 ENCOUNTER — Ambulatory Visit (INDEPENDENT_AMBULATORY_CARE_PROVIDER_SITE_OTHER): Payer: Medicare Other | Admitting: *Deleted

## 2019-06-06 DIAGNOSIS — I639 Cerebral infarction, unspecified: Secondary | ICD-10-CM

## 2019-06-06 LAB — CUP PACEART INCLINIC DEVICE CHECK
Date Time Interrogation Session: 20200609153118
Implantable Pulse Generator Implant Date: 20200528

## 2019-06-06 NOTE — Progress Notes (Signed)
ILR wound check in clinic. Steri strips removed. Wound well healed. R waves .57 mv. Home monitor transmitting nightly. Multiple episodes AF/AT that are due to intermittant undersensing due to variable r wave amplitude and TWOS . Decay delay increased to 200 ms, AT/AF Recording Threshold changed to record episodes > 10 minutes, and AF detection programmed less sensitive. Questions answered. Pt to to send manual transmission when she arrives at her home.

## 2019-06-06 NOTE — Patient Instructions (Signed)
Please send manual transmission when you get home.

## 2019-06-15 ENCOUNTER — Telehealth: Payer: Self-pay | Admitting: Internal Medicine

## 2019-06-15 DIAGNOSIS — M545 Low back pain, unspecified: Secondary | ICD-10-CM

## 2019-06-15 NOTE — Telephone Encounter (Signed)
Appt scheduled

## 2019-06-15 NOTE — Telephone Encounter (Signed)
Attempted to contact pt, no VM to leave msg, phone constantly rings.

## 2019-06-15 NOTE — Addendum Note (Signed)
Addended by: Biagio Borg on: 06/15/2019 01:25 PM   Modules accepted: Orders

## 2019-06-15 NOTE — Telephone Encounter (Signed)
OV needed? Or what is your next suggestion? Please advise.

## 2019-06-15 NOTE — Telephone Encounter (Signed)
Patient would like to discuss persistent back pain that is not improving with hydrocodone and zanaflex.

## 2019-06-15 NOTE — Telephone Encounter (Signed)
Noted  

## 2019-06-15 NOTE — Telephone Encounter (Signed)
Ok to refer to Dr Tamala Julian - I will place the order, thanks

## 2019-06-23 ENCOUNTER — Telehealth: Payer: Self-pay | Admitting: Internal Medicine

## 2019-06-23 MED ORDER — ATORVASTATIN CALCIUM 40 MG PO TABS: 40.0000 mg | ORAL_TABLET | Freq: Every day | ORAL | 3 refills | Status: DC

## 2019-06-23 MED ORDER — CLOPIDOGREL BISULFATE 75 MG PO TABS: 75.0000 mg | ORAL_TABLET | Freq: Every day | ORAL | 3 refills | Status: DC

## 2019-06-23 NOTE — Addendum Note (Signed)
Addended by: Biagio Borg on: 06/23/2019 05:10 PM   Modules accepted: Orders

## 2019-06-23 NOTE — Telephone Encounter (Signed)
Yes to continue both - refills done

## 2019-06-23 NOTE — Telephone Encounter (Signed)
Pt calling again to find out if she should continue taking these medications.

## 2019-06-23 NOTE — Telephone Encounter (Signed)
Pt called and stated she was in the hospital and was prescribed  atorvastatin (LIPITOR) 40 MG tablet  And clopidogrel (PLAVIX) 75 MG tablet  Pt wants to know if Dr. Jenny Reichmann wants her to keep taking these and if so she has no more refills and would need Rx called into pharmacy / Please advise

## 2019-06-26 NOTE — Telephone Encounter (Signed)
Pt has been informed to continue medications and refills were sent in.

## 2019-06-27 ENCOUNTER — Ambulatory Visit: Payer: Medicare Other | Admitting: Neurology

## 2019-06-27 ENCOUNTER — Ambulatory Visit (INDEPENDENT_AMBULATORY_CARE_PROVIDER_SITE_OTHER): Payer: Medicare Other | Admitting: *Deleted

## 2019-06-27 DIAGNOSIS — I639 Cerebral infarction, unspecified: Secondary | ICD-10-CM

## 2019-06-28 ENCOUNTER — Other Ambulatory Visit: Payer: Self-pay

## 2019-06-28 ENCOUNTER — Telehealth: Payer: Self-pay

## 2019-06-28 ENCOUNTER — Encounter: Payer: Self-pay | Admitting: Neurology

## 2019-06-28 ENCOUNTER — Ambulatory Visit: Payer: Medicare Other | Admitting: Neurology

## 2019-06-28 ENCOUNTER — Ambulatory Visit: Payer: Medicare Other | Admitting: Family Medicine

## 2019-06-28 VITALS — BP 163/56 | HR 76 | Temp 97.5°F | Ht 63.0 in | Wt 120.0 lb

## 2019-06-28 DIAGNOSIS — H3412 Central retinal artery occlusion, left eye: Secondary | ICD-10-CM | POA: Diagnosis not present

## 2019-06-28 DIAGNOSIS — Z0289 Encounter for other administrative examinations: Secondary | ICD-10-CM

## 2019-06-28 LAB — CUP PACEART REMOTE DEVICE CHECK
Date Time Interrogation Session: 20200630213638
Implantable Pulse Generator Implant Date: 20200528

## 2019-06-28 NOTE — Patient Instructions (Signed)
I had a long d/w patient about her recent episode of left eye vision loss from CRAO, risk for recurrent stroke/TIAs, personally independently reviewed imaging studies and stroke evaluation results and answered questions.Continue aspirin 81 mg daily but discontinue Plavix for secondary stroke prevention and maintain strict control of hypertension with blood pressure goal below 130/90, diabetes with hemoglobin A1c goal below 6.5% and lipids with LDL cholesterol goal below 70 mg/dL. I also advised the patient to eat a healthy diet with plenty of whole grains, cereals, fruits and vegetables, exercise regularly and maintain ideal body weight.  Keep upcoming follow-up visit with ophthalmologist Dr. Katy Fitch for her vision loss followup in the future with my nurse practitioner Janett Billow in 6 months or call earlier if necessary   Stroke Prevention Some medical conditions and behaviors are associated with a higher chance of having a stroke. You can help prevent a stroke by making nutrition, lifestyle, and other changes, including managing any medical conditions you may have. What nutrition changes can be made?   Eat healthy foods. You can do this by: ? Choosing foods high in fiber, such as fresh fruits and vegetables and whole grains. ? Eating at least 5 or more servings of fruits and vegetables a day. Try to fill half of your plate at each meal with fruits and vegetables. ? Choosing lean protein foods, such as lean cuts of meat, poultry without skin, fish, tofu, beans, and nuts. ? Eating low-fat dairy products. ? Avoiding foods that are high in salt (sodium). This can help lower blood pressure. ? Avoiding foods that have saturated fat, trans fat, and cholesterol. This can help prevent high cholesterol. ? Avoiding processed and premade foods.  Follow your health care provider's specific guidelines for losing weight, controlling high blood pressure (hypertension), lowering high cholesterol, and managing diabetes.  These may include: ? Reducing your daily calorie intake. ? Limiting your daily sodium intake to 1,500 milligrams (mg). ? Using only healthy fats for cooking, such as olive oil, canola oil, or sunflower oil. ? Counting your daily carbohydrate intake. What lifestyle changes can be made?  Maintain a healthy weight. Talk to your health care provider about your ideal weight.  Get at least 30 minutes of moderate physical activity at least 5 days a week. Moderate activity includes brisk walking, biking, and swimming.  Do not use any products that contain nicotine or tobacco, such as cigarettes and e-cigarettes. If you need help quitting, ask your health care provider. It may also be helpful to avoid exposure to secondhand smoke.  Limit alcohol intake to no more than 1 drink a day for nonpregnant women and 2 drinks a day for men. One drink equals 12 oz of beer, 5 oz of wine, or 1 oz of hard liquor.  Stop any illegal drug use.  Avoid taking birth control pills. Talk to your health care provider about the risks of taking birth control pills if: ? You are over 66 years old. ? You smoke. ? You get migraines. ? You have ever had a blood clot. What other changes can be made?  Manage your cholesterol levels. ? Eating a healthy diet is important for preventing high cholesterol. If cholesterol cannot be managed through diet alone, you may also need to take medicines. ? Take any prescribed medicines to control your cholesterol as told by your health care provider.  Manage your diabetes. ? Eating a healthy diet and exercising regularly are important parts of managing your blood sugar. If your blood sugar  cannot be managed through diet and exercise, you may need to take medicines. ? Take any prescribed medicines to control your diabetes as told by your health care provider.  Control your hypertension. ? To reduce your risk of stroke, try to keep your blood pressure below 130/80. ? Eating a healthy  diet and exercising regularly are an important part of controlling your blood pressure. If your blood pressure cannot be managed through diet and exercise, you may need to take medicines. ? Take any prescribed medicines to control hypertension as told by your health care provider. ? Ask your health care provider if you should monitor your blood pressure at home. ? Have your blood pressure checked every year, even if your blood pressure is normal. Blood pressure increases with age and some medical conditions.  Get evaluated for sleep disorders (sleep apnea). Talk to your health care provider about getting a sleep evaluation if you snore a lot or have excessive sleepiness.  Take over-the-counter and prescription medicines only as told by your health care provider. Aspirin or blood thinners (antiplatelets or anticoagulants) may be recommended to reduce your risk of forming blood clots that can lead to stroke.  Make sure that any other medical conditions you have, such as atrial fibrillation or atherosclerosis, are managed. What are the warning signs of a stroke? The warning signs of a stroke can be easily remembered as BEFAST.  B is for balance. Signs include: ? Dizziness. ? Loss of balance or coordination. ? Sudden trouble walking.  E is for eyes. Signs include: ? A sudden change in vision. ? Trouble seeing.  F is for face. Signs include: ? Sudden weakness or numbness of the face. ? The face or eyelid drooping to one side.  A is for arms. Signs include: ? Sudden weakness or numbness of the arm, usually on one side of the body.  S is for speech. Signs include: ? Trouble speaking (aphasia). ? Trouble understanding.  T is for time. ? These symptoms may represent a serious problem that is an emergency. Do not wait to see if the symptoms will go away. Get medical help right away. Call your local emergency services (911 in the [place] Do not drive yourself to the hospital.  Other signs of  stroke may include: ? A sudden, severe headache with no known cause. ? Nausea or vomiting. ? Seizure. Where to find more information For more information, visit:  American Stroke Association: www.strokeassociation.org  National Stroke Association: www.stroke.org Summary  You can prevent a stroke by eating healthy, exercising, not smoking, limiting alcohol intake, and managing any medical conditions you may have.  Do not use any products that contain nicotine or tobacco, such as cigarettes and e-cigarettes. If you need help quitting, ask your health care provider. It may also be helpful to avoid exposure to secondhand smoke.  Remember BEFAST for warning signs of stroke. Get help right away if you or a loved one has any of these signs. This information is not intended to replace advice given to you by your health care provider. Make sure you discuss any questions you have with your health care provider. Document Released: 01/21/2005 Document Revised: 11/26/2017 Document Reviewed: 01/19/2017 Elsevier Patient Education  2020 [employer].

## 2019-06-28 NOTE — Progress Notes (Signed)
[name] [address] [place] [address] [phone]       OFFICE FOLLOW-UP NOTE  [name] Date of Birth:  08/24/1946 Medical Record Number:  [id number]   HPI: [name] is a pleasant 73 year old African-American lady seen today for initial office follow-up visit for episode of left eye vision loss in May 2020.  History is obtained from the patient and I personally reviewed imaging films in PACS.  [name] is a 73 year old lady with history of hypertension, hyperglycemia, peripheral vascular disease, cardiac murmur, aortic atherosclerosis who presented to [facility] on 05/24/2019 with episode of sudden onset of vision disturbance the night prior where she was working on her computer.  She started seeing spots in front of her left eye and suddenly lost vision.  She denied any pain or headache.  Her vision apparently came back 10 hours later next morning but spots continued in the vision.  She went and saw her eye doctor Dr. Katy Fitch who did a dilated funduscopic exam and diagnosed central retinal artery occlusion on the left.  Patient was referred to the emergency room for stroke work-up.  CT scan of the head was unremarkable.  Transthoracic echo showed normal ejection fraction with a mobile echodensity was noted on the right atrial side of the intra-atrial septum.  2D echo was unremarkable without significant extracranial stenosis.  Hemoglobin A1c was 5.5.  LDL cholesterol 78 mg percent.  Cardiology was consulted to evaluate the right atrial hypodensity.  A loop recorder was inserted and so far atrial fibrillation has not yet been found until last evaluation today.  Patient was started on aspirin and Plavix and states she is done well.  She is improved vision partially in the left eye and now can detect colors and some motion but is still unable to read.  She has had no other neurological symptoms.  She is tolerating aspirin Plavix well without bruising or  bleeding.  The patient went home on a nicotine patch to quit smoking but it caused itching hence she stopped it.  She is still smoking.  She also tried Chantix which did not help her.  She plans to see her primary care physician to discuss further smoking cessation options.  ROS:   14 system review of systems is positive for appetite change, unexpected weight change, eye itching, loss of vision, constipation and all other systems negative  PMH:  Past Medical History:  Diagnosis Date   AIN III (anal intraepithelial neoplasia III) 06/26/2015   AIN II-III of internal hemorrhoids   Aortic atherosclerosis (HCC)    Arthritis    Chronic diarrhea    COPD  GOLD 0 / active smoker  10/24/2015   Diverticulitis    Diverticulosis    Esophageal stricture    Heart murmur    Hemorrhoids    Hyperglycemia 05/01/2013   Hypertension    Ischemic optic neuropathy, left eye 05/25/2019   Pneumonia 05/01/2013   PVD (peripheral vascular disease) ([place])    Urine incontinence     Social History:  Social History   Socioeconomic History   Marital status: Married    Spouse name: Not on file   Number of children: Not on file   Years of education: 12   Highest education level: Not on file  Occupational History   Occupation: retired  Scientist, product/process development strain: Not on file   Food insecurity    Worry: Not on file    Inability:  Not on file   Transportation needs    Medical: Not on file    Non-medical: Not on file  Tobacco Use   Smoking status: Current Every Day Smoker    Packs/day: 1.00    Years: 62.00    Pack years: 62.00    Types: Cigarettes   Smokeless tobacco: Never Used  Substance and Sexual Activity   Alcohol use: Yes    Comment: pt reports drinking 1 pint  per week   Drug use: No   Sexual activity: Not Currently  Lifestyle   Physical activity    Days per week: Not on file    Minutes per session: Not on file   Stress: Not on file  Relationships    Social connections    Talks on phone: Not on file    Gets together: Not on file    Attends religious service: Not on file    Active member of club or organization: Not on file    Attends meetings of clubs or organizations: Not on file    Relationship status: Not on file   Intimate partner violence    Fear of current or ex partner: Not on file    Emotionally abused: Not on file    Physically abused: Not on file    Forced sexual activity: Not on file  Other Topics Concern   Not on file  Social History Narrative   Not on file    Medications:   Current Outpatient Medications on File Prior to Visit  Medication Sig Dispense Refill   aspirin 81 MG EC tablet Take 1 tablet (81 mg total) by mouth daily. Swallow whole. 30 tablet 12   atorvastatin (LIPITOR) 40 MG tablet Take 1 tablet (40 mg total) by mouth daily at 6 PM. 90 tablet 3   feeding supplement, ENSURE ENLIVE, (ENSURE ENLIVE) LIQD Take 237 mLs by mouth daily at 3 pm. 237 mL 12   fluticasone (FLONASE) 50 MCG/ACT nasal spray Place 2 sprays into both nostrils daily. (Patient taking differently: Place 2 sprays into both nostrils as needed for rhinitis. ) 16 g 6   HYDROcodone-acetaminophen (NORCO/VICODIN) 5-325 MG tablet Take 1 tablet by mouth every 6 (six) hours as needed for moderate pain. 30 tablet 0   losartan (COZAAR) 25 MG tablet Take 1 tablet (25 mg total) by mouth 2 (two) times daily. 180 tablet 1   NIFEdipine (PROCARDIA XL/NIFEDICAL-XL) 90 MG 24 hr tablet TAKE 1 TABLET (90 MG TOTAL) BY MOUTH DAILY. (Patient taking differently: Take 90 mg by mouth daily. ) 90 tablet 1   tiZANidine (ZANAFLEX) 2 MG tablet Take 1 tablet (2 mg total) by mouth every 6 (six) hours as needed for muscle spasms. 30 tablet 2   zolpidem (AMBIEN) 5 MG tablet Take 1 tablet (5 mg total) by mouth at bedtime as needed for sleep. 30 tablet 5   No current facility-administered medications on file prior to visit.     Allergies:   Allergies  Allergen  Reactions   Ace Inhibitors Cough   Levaquin [Levofloxacin] Other (See Comments)    GI upset    Physical Exam General: well developed, well nourished elderly African-American lady, seated, in no evident distress Head: head normocephalic and atraumatic.  Neck: supple with no carotid or supraclavicular bruits Cardiovascular: regular rate and rhythm, no murmurs Musculoskeletal: no deformity Skin:  no rash/petichiae Vascular:  Normal pulses all extremities Vitals:   06/28/19 0902  BP: (!) 163/56  Pulse: 76  Temp: (!) 97.5 F (  36.4 C)   Neurologic Exam Mental Status: Awake and fully alert. Oriented to place and time. Recent and remote memory intact. Attention span, concentration and fund of knowledge appropriate. Mood and affect appropriate.  Cranial Nerves: Fundoscopic exam difficult to do through undilated fundus..  Pupils are equal but left is sluggishly reactive.  Vision acuity is diminished significantly in the left eye but she can detect color at 2 feet and motion.. Extraocular movements full without nystagmus. Visual fields full to confrontation. Hearing intact. Facial sensation intact. Face, tongue, palate moves normally and symmetrically.  Motor: Normal bulk and tone. Normal strength in all tested extremity muscles. Sensory.: intact to touch ,pinprick .position and vibratory sensation.  Coordination: Rapid alternating movements normal in all extremities. Finger-to-nose and heel-to-shin performed accurately bilaterally. Gait and Station: Arises from chair without difficulty. Stance is normal. Gait demonstrates normal stride length and balance . Able to heel, toe and tandem walk without difficulty.  Reflexes: 1+ and symmetric. Toes downgoing.   NIHSS  0 Modified Rankin  2   ASSESSMENT: 73 year old African-American lady with painless sudden onset of left eye vision loss and May 2020-secondary to central retinal artery occlusion.  Vascular risk factors of hyperlipidemia  hypertension, smoking and age     PLAN: I had a long d/w patient about her recent episode of left eye vision loss from CRAO, risk for recurrent stroke/TIAs, personally independently reviewed imaging studies and stroke evaluation results and answered questions.Continue aspirin 81 mg daily but discontinue Plavix for secondary stroke prevention and maintain strict control of hypertension with blood pressure goal below 130/90, diabetes with hemoglobin A1c goal below 6.5% and lipids with LDL cholesterol goal below 70 mg/dL. I also advised the patient to eat a healthy diet with plenty of whole grains, cereals, fruits and vegetables, exercise regularly and maintain ideal body weight.  I counseled her to quit smoking completely and to seek help from primary care physician to do so.  Keep upcoming follow-up visit with ophthalmologist Dr. Katy Fitch for her vision loss followup in the future with my nurse practitioner Janett Billow in 6 months or call earlier if necessary Greater than 50% of time during this 25 minute visit was spent on counseling,explanation of diagnosis opf central retinal artery occlusion, planning of further management, discussion with patient and family and coordination of care Antony Contras, MD  [facility] [address] [place], [place] [address]  Phone [phone] Fax [phone] Note: This document was prepared with digital dictation and possible smart phrase technology. Any transcriptional errors that result from this process are unintentional

## 2019-06-28 NOTE — Progress Notes (Deleted)
[facility] [place] [place], [place] [address] Phone: [phone] Subjective:    I'm seeing this patient by the request  of:    CC:   MEQ:[id number]  [place] is a 73 y.o. female coming in with complaint of ***  Onset-  Location Duration-  Character- Aggravating factors- Reliving factors-  Therapies tried-  Severity-     Past Medical History:  Diagnosis Date  . 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 Aortic atherosclerosis ([place])   . Arthritis   . Chronic diarrhea   . COPD  GOLD 0 / active smoker  10/24/2015  . Diverticulitis   . Diverticulosis   . Esophageal stricture   . Heart murmur   . Hemorrhoids   . Hyperglycemia 05/01/2013  . Hypertension   . 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 Pneumonia 05/01/2013  . PVD (peripheral vascular disease) ([place])   . Urine incontinence    Past Surgical History:  Procedure Laterality Date  . ABDOMINAL HYSTERECTOMY    . HEMORRHOID SURGERY N/A 06/26/2015   Procedure: INTERNAL AND EXTERNAL HEMORRHOIDECTOMY;  Surgeon: Georganna Skeans, MD;  Location: [facility];  Service: General;  Laterality: N/A;  . LOOP RECORDER INSERTION N/A 05/25/2019   Procedure: LOOP RECORDER INSERTION;  Surgeon: Constance Haw, MD;  Location: [facility];  Service: Cardiovascular;  Laterality: N/A;   Social History   Socioeconomic History  . Marital status: Married    Spouse name: Not on file  . Number of children: Not on file  . Years of education: 63  . Highest education level: Not on file  Occupational History  . Occupation: retired  Scientific laboratory technician  . Financial resource strain: Not on file  . Food insecurity    Worry: Not on file    Inability: Not on file  . Transportation needs    Medical: Not on file    Non-medical: Not on file  Tobacco Use  . Smoking status: Current Every Day Smoker    Packs/day: 1.00   Years: 62.00    Pack years: 62.00    Types: Cigarettes  . Smokeless tobacco: Never Used  Substance and Sexual Activity  . Alcohol use: Yes    Comment: pt reports drinking 1 pint  per week  . Drug use: No  . Sexual activity: Not Currently  Lifestyle  . Physical activity    Days per week: Not on file    Minutes per session: Not on file  . Stress: Not on file  Relationships  . Social Herbalist on phone: Not on file    Gets together: Not on file    Attends religious service: Not on file    Active member of club or organization: Not on file    Attends meetings of clubs or organizations: Not on file    Relationship status: Not on file  Other Topics Concern  . Not on file  Social History Narrative  . Not on file   Allergies  Allergen Reactions  . Ace Inhibitors Cough  . Levaquin [Levofloxacin] Other (See Comments)    GI upset   Family History  Problem Relation Age of Onset  . Hypertension Mother   . Lung cancer Father   . Colon cancer Neg Hx   . Esophageal cancer Neg Hx   . Pancreatic cancer Neg Hx   . Liver disease Neg Hx   .  Stroke Neg Hx      Current Outpatient Medications (Cardiovascular):  .  atorvastatin (LIPITOR) 40 MG tablet, Take 1 tablet (40 mg total) by mouth daily at 6 PM. .  losartan (COZAAR) 25 MG tablet, Take 1 tablet (25 mg total) by mouth 2 (two) times daily. Marland Kitchen  NIFEdipine (PROCARDIA XL/NIFEDICAL-XL) 90 MG 24 hr tablet, TAKE 1 TABLET (90 MG TOTAL) BY MOUTH DAILY. (Patient taking differently: Take 90 mg by mouth daily. )  Current Outpatient Medications (Respiratory):  .  fluticasone (FLONASE) 50 MCG/ACT nasal spray, Place 2 sprays into both nostrils daily. (Patient taking differently: Place 2 sprays into both nostrils as needed for rhinitis. )  Current Outpatient Medications (Analgesics):  .  aspirin 81 MG EC tablet, Take 1 tablet (81 mg total) by mouth daily. Swallow whole. Marland Kitchen  HYDROcodone-acetaminophen (NORCO/VICODIN) 5-325 MG tablet, Take 1  tablet by mouth every 6 (six) hours as needed for moderate pain.  Current Outpatient Medications (Hematological):  .  clopidogrel (PLAVIX) 75 MG tablet, Take 1 tablet (75 mg total) by mouth daily.  Current Outpatient Medications (Other):  .  feeding supplement, ENSURE ENLIVE, (ENSURE ENLIVE) LIQD, Take 237 mLs by mouth daily at 3 pm. .  nicotine (NICODERM CQ - DOSED IN MG/24 HOURS) 14 mg/24hr patch, Place 1 patch (14 mg total) onto the skin daily. Marland Kitchen  tiZANidine (ZANAFLEX) 2 MG tablet, Take 1 tablet (2 mg total) by mouth every 6 (six) hours as needed for muscle spasms. Marland Kitchen  zolpidem (AMBIEN) 5 MG tablet, Take 1 tablet (5 mg total) by mouth at bedtime as needed for sleep.    Past medical history, social, surgical and family history all reviewed in electronic medical record.  No pertanent information unless stated regarding to the chief complaint.   Review of Systems:  No headache, visual changes, nausea, vomiting, diarrhea, constipation, dizziness, abdominal pain, skin rash, fevers, chills, night sweats, weight loss, swollen lymph nodes, body aches, joint swelling, muscle aches, chest pain, shortness of breath, mood changes.   Objective  There were no vitals taken for this visit. Systems examined below as of    General: No apparent distress alert and oriented x3 mood and affect normal, dressed appropriately.  HEENT: Pupils equal, extraocular movements intact  Respiratory: Patient's speak in full sentences and does not appear short of breath  Cardiovascular: No lower extremity edema, non tender, no erythema  Skin: Warm dry intact with no signs of infection or rash on extremities or on axial skeleton.  Abdomen: Soft nontender  Neuro: Cranial nerves II through XII are intact, neurovascularly intact in all extremities with 2+ DTRs and 2+ pulses.  Lymph: No lymphadenopathy of posterior or anterior cervical chain or axillae bilaterally.  Gait normal with good balance and coordination.  MSK:  Non  tender with full range of motion and good stability and symmetric strength and tone of shoulders, elbows, wrist, hip, knee and ankles bilaterally.     Impression and Recommendations:     This case required medical decision making of moderate complexity. The above documentation has been reviewed and is accurate and complete Lyndal Pulley, DO       Note: This dictation was prepared with Dragon dictation along with smaller phrase technology. Any transcriptional errors that result from this process are unintentional.

## 2019-06-28 NOTE — Telephone Encounter (Signed)
Patient no show for appt on 06/27/2019.

## 2019-07-08 NOTE — Progress Notes (Signed)
Carelink Summary Report / Loop Recorder 

## 2019-07-11 ENCOUNTER — Encounter: Payer: Self-pay | Admitting: Internal Medicine

## 2019-07-12 ENCOUNTER — Ambulatory Visit (INDEPENDENT_AMBULATORY_CARE_PROVIDER_SITE_OTHER): Payer: Medicare Other | Admitting: Internal Medicine

## 2019-07-12 DIAGNOSIS — R739 Hyperglycemia, unspecified: Secondary | ICD-10-CM

## 2019-07-12 DIAGNOSIS — G8929 Other chronic pain: Secondary | ICD-10-CM

## 2019-07-12 DIAGNOSIS — M545 Low back pain, unspecified: Secondary | ICD-10-CM

## 2019-07-12 DIAGNOSIS — H1013 Acute atopic conjunctivitis, bilateral: Secondary | ICD-10-CM

## 2019-07-12 MED ORDER — HYDROCODONE-ACETAMINOPHEN 7.5-325 MG PO TABS: 1.0000 | ORAL_TABLET | Freq: Two times a day (BID) | ORAL | 0 refills | Status: DC | PRN

## 2019-07-12 NOTE — Progress Notes (Signed)
Patient ID: [place], female   DOB: 1946-02-26, 73 y.o.   MRN: [id number]   Phone note only  Cumulative time during 7-day interval 18 min, there was not an associated office visit for this concern within a 7 day period.  Verbal consent for services obtained from patient prior to services given.  Names of all persons present for services: Cathlean Cower, MD, patient  Chief complaint: back pain and eye allergy symptoms  History, background, results pertinent:  Here to f/u with mod to severe recurring LBP , without bowel or bladder change, fever, wt loss,  worsening LE pain/numbness/weakness, gait change or falls.  Does have several wks ongoing eye allergy symptoms with clearish congestion, itchy swelling , itch and sneezing, without fever, pain, ST, cough, swelling or wheezing.  Pt denies chest pain, increased sob or doe, wheezing, orthopnea, PND, increased LE swelling, palpitations, dizziness or syncope.   Pt denies polydipsia, polyuria Past Medical History:  Diagnosis Date  . 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 Aortic atherosclerosis ([place])   . Arthritis   . Chronic diarrhea   . COPD  GOLD 0 / active smoker  10/24/2015  . Diverticulitis   . Diverticulosis   . Esophageal stricture   . Heart murmur   . Hemorrhoids   . Hyperglycemia 05/01/2013  . Hypertension   . 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 Pneumonia 05/01/2013  . PVD (peripheral vascular disease) ([place])   . Urine incontinence    No results found for this or any previous visit (from the past 48 hour(s)). Lab Results  Component Value Date   WBC 10.3 05/24/2019   HGB 14.9 05/24/2019   HCT 49.1 (H) 05/24/2019   PLT 409 (H) 05/24/2019   GLUCOSE 79 05/25/2019   CHOL 144 05/25/2019   TRIG 69 05/25/2019   HDL 52 05/25/2019   LDLCALC 78 05/25/2019   ALT 12 05/24/2019   AST 13 (L) 05/24/2019   NA 142 05/25/2019   K 3.1 (L) 05/25/2019   CL 105 05/25/2019    CREATININE 1.11 (H) 05/25/2019   BUN 10 05/25/2019   CO2 24 05/25/2019   TSH 0.337 (L) 05/24/2019   INR 1.1 05/24/2019   HGBA1C 5.5 05/25/2019   A/P/next steps:   Acute on chronic LBP - for increased vicodin 7.5 bid prn, refer orthopedic, cont zanaflex prn,  to f/u any worsening symptoms or concerns  Allergic conjunctivitis - mild, for zaditor otc prn,  to f/u any worsening symptoms or concerns  Cathlean Cower MD

## 2019-07-16 ENCOUNTER — Encounter: Payer: Self-pay | Admitting: Internal Medicine

## 2019-07-16 NOTE — Assessment & Plan Note (Signed)
See notes

## 2019-07-16 NOTE — Assessment & Plan Note (Signed)
stable overall by history and exam, recent data reviewed with pt, and pt to continue medical treatment as before,  to f/u any worsening symptoms or concerns  

## 2019-07-16 NOTE — Patient Instructions (Signed)
Ok for the vicodin as prescribed, and ok for OTC zaditor for the eyes  You will be contacted regarding the referral for: orthopedic  Please continue all other medications as before, and refills have been done if requested.  Please have the pharmacy call with any other refills you may need.  Please keep your appointments with your specialists as you may have planned

## 2019-07-18 ENCOUNTER — Other Ambulatory Visit: Payer: Self-pay

## 2019-07-18 NOTE — Patient Outreach (Signed)
[facility]) Care Management  07/18/2019  [place] 10/01/1946 [id number]   Medication Adherence call to [name] patient did not answer patient is past due on Losartan 25 mg under [place].  [facility] Management Direct Dial [phone]  Fax [phone] [email or web address]

## 2019-07-31 ENCOUNTER — Ambulatory Visit (INDEPENDENT_AMBULATORY_CARE_PROVIDER_SITE_OTHER): Payer: Medicare Other | Admitting: *Deleted

## 2019-07-31 DIAGNOSIS — I639 Cerebral infarction, unspecified: Secondary | ICD-10-CM

## 2019-07-31 LAB — CUP PACEART REMOTE DEVICE CHECK
Date Time Interrogation Session: 20200802221411
Implantable Pulse Generator Implant Date: 20200528

## 2019-08-09 NOTE — Progress Notes (Signed)
Carelink Summary Report / Loop Recorder 

## 2019-08-11 DIAGNOSIS — M48061 Spinal stenosis, lumbar region without neurogenic claudication: Secondary | ICD-10-CM | POA: Diagnosis not present

## 2019-08-11 DIAGNOSIS — M545 Low back pain: Secondary | ICD-10-CM | POA: Diagnosis not present

## 2019-08-25 DIAGNOSIS — M5136 Other intervertebral disc degeneration, lumbar region: Secondary | ICD-10-CM | POA: Diagnosis not present

## 2019-08-25 DIAGNOSIS — R200 Anesthesia of skin: Secondary | ICD-10-CM | POA: Diagnosis not present

## 2019-08-26 ENCOUNTER — Other Ambulatory Visit: Payer: Self-pay | Admitting: Internal Medicine

## 2019-08-28 NOTE — Telephone Encounter (Signed)
Done erx 

## 2019-09-01 ENCOUNTER — Ambulatory Visit (INDEPENDENT_AMBULATORY_CARE_PROVIDER_SITE_OTHER): Payer: Medicare Other | Admitting: *Deleted

## 2019-09-01 DIAGNOSIS — I639 Cerebral infarction, unspecified: Secondary | ICD-10-CM

## 2019-09-02 LAB — CUP PACEART REMOTE DEVICE CHECK
Date Time Interrogation Session: 20200904223712
Implantable Pulse Generator Implant Date: 20200528

## 2019-09-13 NOTE — Progress Notes (Signed)
Carelink Summary Report / Loop Recorder 

## 2019-09-18 DIAGNOSIS — G5603 Carpal tunnel syndrome, bilateral upper limbs: Secondary | ICD-10-CM | POA: Diagnosis not present

## 2019-09-26 DIAGNOSIS — G5602 Carpal tunnel syndrome, left upper limb: Secondary | ICD-10-CM | POA: Diagnosis not present

## 2019-09-26 DIAGNOSIS — G5601 Carpal tunnel syndrome, right upper limb: Secondary | ICD-10-CM | POA: Diagnosis not present

## 2019-09-26 DIAGNOSIS — M79641 Pain in right hand: Secondary | ICD-10-CM | POA: Diagnosis not present

## 2019-09-26 DIAGNOSIS — M79642 Pain in left hand: Secondary | ICD-10-CM | POA: Diagnosis not present

## 2019-09-26 DIAGNOSIS — G5603 Carpal tunnel syndrome, bilateral upper limbs: Secondary | ICD-10-CM | POA: Diagnosis not present

## 2019-09-28 HISTORY — PX: CARPAL TUNNEL RELEASE: SHX101

## 2019-10-04 ENCOUNTER — Ambulatory Visit (INDEPENDENT_AMBULATORY_CARE_PROVIDER_SITE_OTHER): Payer: Medicare Other | Admitting: *Deleted

## 2019-10-04 DIAGNOSIS — I639 Cerebral infarction, unspecified: Secondary | ICD-10-CM | POA: Diagnosis not present

## 2019-10-05 LAB — CUP PACEART REMOTE DEVICE CHECK
Date Time Interrogation Session: 20201007224053
Implantable Pulse Generator Implant Date: 20200528

## 2019-10-13 NOTE — Progress Notes (Signed)
Carelink Summary Report / Loop Recorder 

## 2019-10-18 DIAGNOSIS — M79641 Pain in right hand: Secondary | ICD-10-CM | POA: Diagnosis not present

## 2019-10-18 DIAGNOSIS — M79642 Pain in left hand: Secondary | ICD-10-CM | POA: Diagnosis not present

## 2019-10-18 DIAGNOSIS — G5603 Carpal tunnel syndrome, bilateral upper limbs: Secondary | ICD-10-CM | POA: Diagnosis not present

## 2019-10-23 ENCOUNTER — Other Ambulatory Visit: Payer: Self-pay | Admitting: Internal Medicine

## 2019-10-23 DIAGNOSIS — I10 Essential (primary) hypertension: Secondary | ICD-10-CM

## 2019-10-26 DIAGNOSIS — G5601 Carpal tunnel syndrome, right upper limb: Secondary | ICD-10-CM | POA: Diagnosis not present

## 2019-11-06 ENCOUNTER — Ambulatory Visit (INDEPENDENT_AMBULATORY_CARE_PROVIDER_SITE_OTHER): Payer: Medicare Other | Admitting: *Deleted

## 2019-11-06 DIAGNOSIS — I639 Cerebral infarction, unspecified: Secondary | ICD-10-CM | POA: Diagnosis not present

## 2019-11-07 DIAGNOSIS — M79641 Pain in right hand: Secondary | ICD-10-CM | POA: Diagnosis not present

## 2019-11-07 LAB — CUP PACEART REMOTE DEVICE CHECK
Date Time Interrogation Session: 20201109224235
Implantable Pulse Generator Implant Date: 20200528

## 2019-11-21 DIAGNOSIS — M79641 Pain in right hand: Secondary | ICD-10-CM | POA: Diagnosis not present

## 2019-12-02 NOTE — Progress Notes (Signed)
Carelink Summary Report / Loop Recorder 

## 2019-12-08 ENCOUNTER — Ambulatory Visit (INDEPENDENT_AMBULATORY_CARE_PROVIDER_SITE_OTHER): Payer: Medicare Other | Admitting: *Deleted

## 2019-12-08 DIAGNOSIS — H47012 Ischemic optic neuropathy, left eye: Secondary | ICD-10-CM

## 2019-12-11 ENCOUNTER — Ambulatory Visit (INDEPENDENT_AMBULATORY_CARE_PROVIDER_SITE_OTHER): Payer: Medicare Other | Admitting: *Deleted

## 2019-12-11 DIAGNOSIS — H3412 Central retinal artery occlusion, left eye: Secondary | ICD-10-CM | POA: Diagnosis not present

## 2019-12-11 LAB — CUP PACEART REMOTE DEVICE CHECK
Date Time Interrogation Session: 20201212174644
Implantable Pulse Generator Implant Date: 20200528

## 2019-12-15 DIAGNOSIS — G5601 Carpal tunnel syndrome, right upper limb: Secondary | ICD-10-CM | POA: Diagnosis not present

## 2020-01-09 NOTE — Progress Notes (Signed)
ILR remote 

## 2020-01-11 ENCOUNTER — Ambulatory Visit (INDEPENDENT_AMBULATORY_CARE_PROVIDER_SITE_OTHER): Payer: Medicare Other | Admitting: *Deleted

## 2020-01-11 DIAGNOSIS — H3412 Central retinal artery occlusion, left eye: Secondary | ICD-10-CM

## 2020-01-12 LAB — CUP PACEART REMOTE DEVICE CHECK
Date Time Interrogation Session: 20210114180405
Implantable Pulse Generator Implant Date: 20200528

## 2020-01-24 ENCOUNTER — Ambulatory Visit (INDEPENDENT_AMBULATORY_CARE_PROVIDER_SITE_OTHER): Payer: Medicare Other | Admitting: Internal Medicine

## 2020-01-24 ENCOUNTER — Other Ambulatory Visit: Payer: Self-pay

## 2020-01-24 ENCOUNTER — Encounter: Payer: Self-pay | Admitting: Internal Medicine

## 2020-01-24 VITALS — BP 148/72 | HR 68 | Temp 98.1°F | Ht 62.0 in | Wt 128.4 lb

## 2020-01-24 DIAGNOSIS — I10 Essential (primary) hypertension: Secondary | ICD-10-CM

## 2020-01-24 DIAGNOSIS — H3412 Central retinal artery occlusion, left eye: Secondary | ICD-10-CM

## 2020-01-24 DIAGNOSIS — N183 Chronic kidney disease, stage 3 unspecified: Secondary | ICD-10-CM

## 2020-01-24 DIAGNOSIS — Z0001 Encounter for general adult medical examination with abnormal findings: Secondary | ICD-10-CM

## 2020-01-24 DIAGNOSIS — E538 Deficiency of other specified B group vitamins: Secondary | ICD-10-CM

## 2020-01-24 DIAGNOSIS — R739 Hyperglycemia, unspecified: Secondary | ICD-10-CM | POA: Diagnosis not present

## 2020-01-24 DIAGNOSIS — E785 Hyperlipidemia, unspecified: Secondary | ICD-10-CM | POA: Diagnosis not present

## 2020-01-24 DIAGNOSIS — E611 Iron deficiency: Secondary | ICD-10-CM

## 2020-01-24 DIAGNOSIS — F419 Anxiety disorder, unspecified: Secondary | ICD-10-CM | POA: Diagnosis not present

## 2020-01-24 DIAGNOSIS — F32A Depression, unspecified: Secondary | ICD-10-CM

## 2020-01-24 DIAGNOSIS — E559 Vitamin D deficiency, unspecified: Secondary | ICD-10-CM

## 2020-01-24 DIAGNOSIS — Z1159 Encounter for screening for other viral diseases: Secondary | ICD-10-CM

## 2020-01-24 DIAGNOSIS — F329 Major depressive disorder, single episode, unspecified: Secondary | ICD-10-CM

## 2020-01-24 LAB — URINALYSIS, ROUTINE W REFLEX MICROSCOPIC
Bilirubin Urine: NEGATIVE
Hgb urine dipstick: NEGATIVE
Ketones, ur: NEGATIVE
Leukocytes,Ua: NEGATIVE
Nitrite: NEGATIVE
RBC / HPF: NONE SEEN (ref 0–?)
Specific Gravity, Urine: 1.025 (ref 1.000–1.030)
Total Protein, Urine: 100 — AB
Urine Glucose: NEGATIVE
Urobilinogen, UA: 0.2 (ref 0.0–1.0)
pH: 6 (ref 5.0–8.0)

## 2020-01-24 LAB — CBC WITH DIFFERENTIAL/PLATELET
Basophils Absolute: 0.1 10*3/uL (ref 0.0–0.1)
Basophils Relative: 0.8 % (ref 0.0–3.0)
Eosinophils Absolute: 0.2 10*3/uL (ref 0.0–0.7)
Eosinophils Relative: 1.9 % (ref 0.0–5.0)
HCT: 33.3 % — ABNORMAL LOW (ref 36.0–46.0)
Hemoglobin: 10.5 g/dL — ABNORMAL LOW (ref 12.0–15.0)
Lymphocytes Relative: 19.9 % (ref 12.0–46.0)
Lymphs Abs: 2.1 10*3/uL (ref 0.7–4.0)
MCHC: 31.5 g/dL (ref 30.0–36.0)
MCV: 73.8 fl — ABNORMAL LOW (ref 78.0–100.0)
Monocytes Absolute: 0.7 10*3/uL (ref 0.1–1.0)
Monocytes Relative: 6.7 % (ref 3.0–12.0)
Neutro Abs: 7.6 10*3/uL (ref 1.4–7.7)
Neutrophils Relative %: 70.7 % (ref 43.0–77.0)
Platelets: 452 10*3/uL — ABNORMAL HIGH (ref 150.0–400.0)
RBC: 4.51 Mil/uL (ref 3.87–5.11)
RDW: 18.4 % — ABNORMAL HIGH (ref 11.5–15.5)
WBC: 10.7 10*3/uL — ABNORMAL HIGH (ref 4.0–10.5)

## 2020-01-24 LAB — LIPID PANEL
Cholesterol: 161 mg/dL (ref 0–200)
HDL: 79.4 mg/dL (ref >39.00)
LDL Cholesterol: 68 mg/dL (ref 0–99)
NonHDL: 81.83
Total CHOL/HDL Ratio: 2
Triglycerides: 69 mg/dL (ref 0.0–149.0)
VLDL: 13.8 mg/dL (ref 0.0–40.0)

## 2020-01-24 LAB — BASIC METABOLIC PANEL
BUN: 22 mg/dL (ref 6–23)
CO2: 22 mEq/L (ref 19–32)
Calcium: 9.1 mg/dL (ref 8.4–10.5)
Chloride: 106 mEq/L (ref 96–112)
Creatinine, Ser: 1.44 mg/dL — ABNORMAL HIGH (ref 0.40–1.20)
GFR: 42.83 mL/min — ABNORMAL LOW (ref >60.00)
Glucose, Bld: 85 mg/dL (ref 70–99)
Potassium: 3.7 mEq/L (ref 3.5–5.1)
Sodium: 137 mEq/L (ref 135–145)

## 2020-01-24 LAB — HEMOGLOBIN A1C: Hgb A1c MFr Bld: 5.9 % (ref 4.6–6.5)

## 2020-01-24 LAB — HEPATIC FUNCTION PANEL
ALT: 14 U/L (ref 0–35)
AST: 18 U/L (ref 0–37)
Albumin: 4.2 g/dL (ref 3.5–5.2)
Alkaline Phosphatase: 68 U/L (ref 39–117)
Bilirubin, Direct: 0.1 mg/dL (ref 0.0–0.3)
Total Bilirubin: 0.5 mg/dL (ref 0.2–1.2)
Total Protein: 6.9 g/dL (ref 6.0–8.3)

## 2020-01-24 LAB — TSH: TSH: 0.6 u[IU]/mL (ref 0.35–4.50)

## 2020-01-24 MED ORDER — LOSARTAN POTASSIUM 50 MG PO TABS: 50.0000 mg | ORAL_TABLET | Freq: Every day | ORAL | 3 refills | Status: DC

## 2020-01-24 MED ORDER — CLOPIDOGREL BISULFATE 75 MG PO TABS: 75.0000 mg | ORAL_TABLET | Freq: Every day | ORAL | 3 refills | Status: DC

## 2020-01-24 MED ORDER — CITALOPRAM HYDROBROMIDE 10 MG PO TABS: 10.0000 mg | ORAL_TABLET | Freq: Every day | ORAL | 3 refills | Status: DC

## 2020-01-24 NOTE — Assessment & Plan Note (Signed)
stable overall by history and exam, recent data reviewed with pt, and pt to continue medical treatment as before,  to f/u any worsening symptoms or concerns  

## 2020-01-24 NOTE — Progress Notes (Signed)
Subjective:    Patient ID: Elaine Turner, female    DOB: Sep 15, 1946, 74 y.o.   MRN: [id number]  HPI  Here for wellness and f/u;  Overall doing ok;  Pt denies Chest pain, worsening SOB, DOE, wheezing, orthopnea, PND, worsening LE edema, palpitations, dizziness or syncope.  Pt denies neurological change such as new headache, facial or extremity weakness.  Pt denies polydipsia, polyuria, or low sugar symptoms. Pt states overall good compliance with treatment and medications, good tolerability, and has been trying to follow appropriate diet.  Pt as had mild to  Mod worsening depressive symptoms, but no suicidal ideation or panic. No fever, night sweats, wt loss, loss of appetite, or other constitutional symptoms.  Pt states good ability with ADL's, has low fall risk, home safety reviewed and adequate, no other significant changes in hearing or vision, and only occasionally active with exercise. Had covid #1 last wk, next due feb 11.  Per pt did not have good results with right hand CTS x 2 mo, but admit shse was told it could take up to a year to get better, getting PT, but still locks up and burns with use.  Also mentions still not taking plaivx after 2 mo when not told how to restart after her right CTS surgury Past Medical History:  Diagnosis Date  . 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 Aortic atherosclerosis ([place])   . Arthritis   . Chronic diarrhea   . COPD  GOLD 0 / active smoker  10/24/2015  . Diverticulitis   . Diverticulosis   . Esophageal stricture   . Heart murmur   . Hemorrhoids   . Hyperglycemia 05/01/2013  . Hypertension   . 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 Pneumonia 05/01/2013  . PVD (peripheral vascular disease) ([place])   . Urine incontinence    Past Surgical History:  Procedure Laterality Date  . ABDOMINAL HYSTERECTOMY    . CARPAL TUNNEL RELEASE Right 09/2019  . HEMORRHOID SURGERY N/A 06/26/2015   Procedure: INTERNAL AND EXTERNAL HEMORRHOIDECTOMY;  Surgeon: Georganna Skeans, MD;  Location: [facility];  Service: General;  Laterality: N/A;  . LOOP RECORDER INSERTION N/A 05/25/2019   Procedure: LOOP RECORDER INSERTION;  Surgeon: Constance Haw, MD;  Location: [facility];  Service: Cardiovascular;  Laterality: N/A;    reports that she has been smoking cigarettes. She has a 62.00 pack-year smoking history. She has never used smokeless tobacco. She reports current alcohol use. She reports that she does not use drugs. family history includes Hypertension in her mother; Lung cancer in her father. Allergies  Allergen Reactions  . Ace Inhibitors Cough  . Levaquin [Levofloxacin] Other (See Comments)    GI upset   Current Outpatient Medications on File Prior to Visit  Medication Sig Dispense Refill  . aspirin 81 MG EC tablet Take 1 tablet (81 mg total) by mouth daily. Swallow whole. 30 tablet 12  . atorvastatin (LIPITOR) 40 MG tablet Take 1 tablet (40 mg total) by mouth daily at 6 PM. 90 tablet 3  . feeding supplement, ENSURE ENLIVE, (ENSURE ENLIVE) LIQD Take 237 mLs by mouth daily at 3 pm. 237 mL 12  . fluticasone (FLONASE) 50 MCG/ACT nasal spray Place 2 sprays into both nostrils daily. (Patient taking differently: Place 2 sprays into both nostrils as needed for rhinitis. ) 16 g 6  . HYDROcodone-acetaminophen (NORCO) 7.5-325 MG tablet Take 1 tablet by  mouth 2 (two) times daily as needed for moderate pain or severe pain. 60 tablet 0  . NIFEdipine (PROCARDIA XL/NIFEDICAL-XL) 90 MG 24 hr tablet TAKE 1 TABLET BY MOUTH EVERY DAY 90 tablet 1  . tiZANidine (ZANAFLEX) 2 MG tablet Take 1 tablet (2 mg total) by mouth every 6 (six) hours as needed for muscle spasms. 30 tablet 2  . zolpidem (AMBIEN) 5 MG tablet TAKE 1 TABLET (5 MG TOTAL) BY MOUTH AT BEDTIME AS NEEDED FOR SLEEP. 30 tablet 5   No current facility-administered medications on file prior to visit.   Review of Systems  All otherwise neg per pt     Objective:   Physical Exam BP (!) 148/72 (BP Location: Right Arm, Patient Position: Sitting, Cuff Size: Normal)   Pulse 68   Temp 98.1 F (36.7 C) (Oral)   Ht 5\' 2"  (1.575 m)   Wt 128 lb 6.4 oz (58.2 kg)   SpO2 98%   BMI 23.48 kg/m  VS noted,  Constitutional: Pt appears in NAD HENT: Head: NCAT.  Right Ear: External ear normal.  Left Ear: External ear normal.  Eyes: . Pupils are equal, round, and reactive to light. Conjunctivae and EOM are normal Nose: without d/c or deformity Neck: Neck supple. Gross normal ROM Cardiovascular: Normal rate and regular rhythm.   Pulmonary/Chest: Effort normal and breath sounds without rales or wheezing.  Abd:  Soft, NT, ND, + BS, no organomegaly Neurological: Pt is alert. At baseline orientation, motor grossly intact Skin: Skin is warm. No rashes, other new lesions, no LE edema Psychiatric: Pt behavior is normal without agitation  All otherwise neg per pt Lab Results  Component Value Date   WBC 10.7 (H) 01/24/2020   HGB 10.5 Repeated and verified X2. (L) 01/24/2020   HCT 33.3 (L) 01/24/2020   PLT 452.0 (H) 01/24/2020   GLUCOSE 85 01/24/2020   CHOL 161 01/24/2020   TRIG 69.0 01/24/2020   HDL 79.40 01/24/2020   LDLCALC 68 01/24/2020   ALT 14 01/24/2020   AST 18 01/24/2020   NA 137 01/24/2020   K 3.7 01/24/2020   CL 106 01/24/2020   CREATININE 1.44 (H) 01/24/2020   BUN 22 01/24/2020   CO2 22 01/24/2020   TSH 0.60 01/24/2020   INR 1.1 05/24/2019   HGBA1C 5.9 01/24/2020       Assessment & Plan:

## 2020-01-24 NOTE — Assessment & Plan Note (Addendum)
Mild to mod, for celexa 10 qd  to f/u any worsening symptoms or concerns  I spent 32 minutes preparing to see the patient by review of recent labs, imaging and procedures, obtaining and reviewing separately obtained history, communicating with the patient and family or caregiver, ordering medications, tests or procedures, and documenting clinical information in the EHR including the differential Dx, treatment, and any further evaluation and other management of anxiety/depression, central retinal artery occulasion, HTN, HLD, hyperglycemia, CKD

## 2020-01-24 NOTE — Assessment & Plan Note (Signed)
To restart plavix

## 2020-01-24 NOTE — Assessment & Plan Note (Signed)
stable overall by history and exam, recent data reviewed with pt, and pt to continue medical treatment as before,  to f/u any worsening symptoms or concerns le  

## 2020-01-24 NOTE — Assessment & Plan Note (Signed)
stable overall by history and exam, recent data reviewed with pt, and pt to continue medical treatment as before,  to f/u any worsening symptoms or concerns;e  

## 2020-01-24 NOTE — Assessment & Plan Note (Addendum)
Mild uncontrolled, for increased losartan 50 qd, o/w stable overall by history and exam, recent data reviewed with pt, and pt to continue medical treatment as before,  to f/u any worsening symptoms or concerns

## 2020-01-24 NOTE — Patient Instructions (Signed)
Ok to increase the losartan to 50 mg per day  OK to restart the plavix 75 mg per day  Please take all new medication as prescribed - the celexa 10 mg per day  Please continue all other medications as before, and refills have been done if requested.  Please have the pharmacy call with any other refills you may need.  Please continue your efforts at being more active, low cholesterol diet, and weight control.  You are otherwise up to date with prevention measures today.  Please keep your appointments with your specialists as you may have planned  Please go to the LAB at the blood drawing area for the tests to be done  You will be contacted by phone if any changes need to be made immediately.  Otherwise, you will receive a letter about your results with an explanation, but please check with MyChart first.  Please remember to sign up for MyChart if you have not done so, as this will be important to you in the future with finding out test results, communicating by private email, and scheduling acute appointments online when needed.  Please make an Appointment to return in 6 months, or sooner if needed

## 2020-01-25 LAB — HEPATITIS C ANTIBODY
Hepatitis C Ab: NONREACTIVE
SIGNAL TO CUT-OFF: 0.01

## 2020-01-26 ENCOUNTER — Encounter: Payer: Self-pay | Admitting: Internal Medicine

## 2020-01-29 ENCOUNTER — Other Ambulatory Visit (INDEPENDENT_AMBULATORY_CARE_PROVIDER_SITE_OTHER): Payer: Medicare Other

## 2020-01-29 ENCOUNTER — Other Ambulatory Visit: Payer: Self-pay

## 2020-01-29 DIAGNOSIS — E611 Iron deficiency: Secondary | ICD-10-CM | POA: Diagnosis not present

## 2020-01-29 DIAGNOSIS — R1030 Lower abdominal pain, unspecified: Secondary | ICD-10-CM | POA: Diagnosis not present

## 2020-01-29 DIAGNOSIS — E538 Deficiency of other specified B group vitamins: Secondary | ICD-10-CM | POA: Diagnosis not present

## 2020-01-29 DIAGNOSIS — E559 Vitamin D deficiency, unspecified: Secondary | ICD-10-CM

## 2020-01-29 LAB — IBC PANEL
Iron: 84 ug/dL (ref 42–145)
Saturation Ratios: 23.1 % (ref 20.0–50.0)
Transferrin: 260 mg/dL (ref 212.0–360.0)

## 2020-01-29 LAB — VITAMIN D 25 HYDROXY (VIT D DEFICIENCY, FRACTURES): VITD: 15.68 ng/mL — ABNORMAL LOW (ref 30.00–100.00)

## 2020-01-29 LAB — VITAMIN B12: Vitamin B-12: 171 pg/mL — ABNORMAL LOW (ref 211–911)

## 2020-01-29 LAB — LIPASE: Lipase: 45 U/L (ref 11.0–59.0)

## 2020-01-30 DIAGNOSIS — M79641 Pain in right hand: Secondary | ICD-10-CM | POA: Diagnosis not present

## 2020-02-06 DIAGNOSIS — M65321 Trigger finger, right index finger: Secondary | ICD-10-CM | POA: Diagnosis not present

## 2020-02-06 DIAGNOSIS — G5603 Carpal tunnel syndrome, bilateral upper limbs: Secondary | ICD-10-CM | POA: Diagnosis not present

## 2020-02-06 DIAGNOSIS — M65341 Trigger finger, right ring finger: Secondary | ICD-10-CM | POA: Diagnosis not present

## 2020-02-12 ENCOUNTER — Ambulatory Visit (INDEPENDENT_AMBULATORY_CARE_PROVIDER_SITE_OTHER): Payer: Medicare Other | Admitting: *Deleted

## 2020-02-12 DIAGNOSIS — H3412 Central retinal artery occlusion, left eye: Secondary | ICD-10-CM

## 2020-02-12 LAB — CUP PACEART REMOTE DEVICE CHECK
Date Time Interrogation Session: 20210214235551
Implantable Pulse Generator Implant Date: 20200528

## 2020-02-12 NOTE — Progress Notes (Signed)
ILR Remote 

## 2020-03-14 ENCOUNTER — Ambulatory Visit (INDEPENDENT_AMBULATORY_CARE_PROVIDER_SITE_OTHER): Payer: Medicare Other | Admitting: *Deleted

## 2020-03-14 DIAGNOSIS — H3412 Central retinal artery occlusion, left eye: Secondary | ICD-10-CM

## 2020-03-14 LAB — CUP PACEART REMOTE DEVICE CHECK
Date Time Interrogation Session: 20210318013343
Implantable Pulse Generator Implant Date: 20200528

## 2020-03-14 NOTE — Progress Notes (Signed)
ILR Remote 

## 2020-04-11 ENCOUNTER — Other Ambulatory Visit: Payer: Self-pay | Admitting: Internal Medicine

## 2020-04-11 DIAGNOSIS — I10 Essential (primary) hypertension: Secondary | ICD-10-CM

## 2020-04-12 DIAGNOSIS — M65321 Trigger finger, right index finger: Secondary | ICD-10-CM | POA: Diagnosis not present

## 2020-04-12 DIAGNOSIS — G5603 Carpal tunnel syndrome, bilateral upper limbs: Secondary | ICD-10-CM | POA: Diagnosis not present

## 2020-04-14 LAB — CUP PACEART REMOTE DEVICE CHECK
Date Time Interrogation Session: 20210418015600
Implantable Pulse Generator Implant Date: 20200528

## 2020-04-15 ENCOUNTER — Ambulatory Visit (INDEPENDENT_AMBULATORY_CARE_PROVIDER_SITE_OTHER): Payer: Medicare Other | Admitting: *Deleted

## 2020-04-15 DIAGNOSIS — H3412 Central retinal artery occlusion, left eye: Secondary | ICD-10-CM

## 2020-04-15 NOTE — Progress Notes (Signed)
ILR Remote 

## 2020-04-25 ENCOUNTER — Other Ambulatory Visit: Payer: Self-pay | Admitting: Internal Medicine

## 2020-04-26 NOTE — Telephone Encounter (Signed)
Done erx 

## 2020-04-26 NOTE — Telephone Encounter (Signed)
Check [place] registry last filled Zolpidem 02/24/2020../lm,b

## 2020-05-04 DIAGNOSIS — G5601 Carpal tunnel syndrome, right upper limb: Secondary | ICD-10-CM | POA: Diagnosis not present

## 2020-05-08 DIAGNOSIS — G5603 Carpal tunnel syndrome, bilateral upper limbs: Secondary | ICD-10-CM | POA: Diagnosis not present

## 2020-05-08 DIAGNOSIS — M65321 Trigger finger, right index finger: Secondary | ICD-10-CM | POA: Diagnosis not present

## 2020-05-15 LAB — CUP PACEART REMOTE DEVICE CHECK
Date Time Interrogation Session: 20210519015939
Implantable Pulse Generator Implant Date: 20200528

## 2020-05-20 ENCOUNTER — Ambulatory Visit (INDEPENDENT_AMBULATORY_CARE_PROVIDER_SITE_OTHER): Payer: Medicare Other | Admitting: *Deleted

## 2020-05-20 DIAGNOSIS — I639 Cerebral infarction, unspecified: Secondary | ICD-10-CM

## 2020-05-20 NOTE — Progress Notes (Signed)
Carelink Summary Report / Loop Recorder 

## 2020-06-12 DIAGNOSIS — M79671 Pain in right foot: Secondary | ICD-10-CM | POA: Diagnosis not present

## 2020-06-24 ENCOUNTER — Ambulatory Visit (INDEPENDENT_AMBULATORY_CARE_PROVIDER_SITE_OTHER): Payer: Medicare Other | Admitting: *Deleted

## 2020-06-24 DIAGNOSIS — I639 Cerebral infarction, unspecified: Secondary | ICD-10-CM | POA: Diagnosis not present

## 2020-06-24 LAB — CUP PACEART REMOTE DEVICE CHECK
Date Time Interrogation Session: 20210628000645
Implantable Pulse Generator Implant Date: 20200528

## 2020-06-25 NOTE — Progress Notes (Signed)
Carelink Summary Report / Loop Recorder 

## 2020-07-22 ENCOUNTER — Other Ambulatory Visit: Payer: Self-pay | Admitting: Internal Medicine

## 2020-07-22 NOTE — Telephone Encounter (Signed)
Please refill as per office routine med refill policy (all routine meds refilled for 3 mo or monthly per pt preference up to one year from last visit, then month to month grace period for 3 mo, then further med refills will have to be denied)  

## 2020-07-29 ENCOUNTER — Ambulatory Visit (INDEPENDENT_AMBULATORY_CARE_PROVIDER_SITE_OTHER): Payer: Medicare Other | Admitting: *Deleted

## 2020-07-29 DIAGNOSIS — I639 Cerebral infarction, unspecified: Secondary | ICD-10-CM | POA: Diagnosis not present

## 2020-07-29 LAB — CUP PACEART REMOTE DEVICE CHECK
Date Time Interrogation Session: 20210731000922
Implantable Pulse Generator Implant Date: 20200528

## 2020-07-30 NOTE — Progress Notes (Signed)
Carelink Summary Report / Loop Recorder 

## 2020-08-22 NOTE — Progress Notes (Signed)
Virtual Visit via Video Note  I connected with Elaine Turner on 08/22/20 at  3:00 PM EDT by a video enabled telemedicine application and verified that I am speaking with the correct person using two identifiers.   I discussed the limitations of evaluation and management by telemedicine and the availability of in person appointments. The patient expressed understanding and agreed to proceed.  Present for the visit:  Myself, Dr Billey Gosling, [facility].  The patient is currently at home and I am in the office.    No referring provider.    History of Present Illness: She is here for an acute visit for cold symptoms.   Her symptoms started last week.  She is experiencing fatigue, sore throat, runny nose, postnasal drip, cough.  She states she was in contact with her granddaughter who has similar symptoms.  She has had the Covid vaccine.  Her symptoms have not been improving.  She does have frequent illnesses like this and this is similar.  She has tried taking mucinex.     Review of Systems  Constitutional: Positive for malaise/fatigue. Negative for chills and fever.       Appetite ok  HENT: Positive for sore throat. Negative for congestion, ear pain and sinus pain.        Rhinorrhea, PND  Respiratory: Positive for cough (dry). Negative for shortness of breath and wheezing.   Gastrointestinal: Negative for diarrhea and nausea.  Musculoskeletal: Negative for myalgias.  Neurological: Negative for dizziness and headaches.      Social History   Socioeconomic History  . Marital status: Married    Spouse name: Not on file  . Number of children: Not on file  . Years of education: 42  . Highest education level: Not on file  Occupational History  . Occupation: retired  Tobacco Use  . Smoking status: Current Every Day Smoker    Packs/day: 1.00    Years: 62.00    Pack years: 62.00    Types: Cigarettes  . Smokeless tobacco: Never Used  Vaping Use  . Vaping Use: Never used   Substance and Sexual Activity  . Alcohol use: Yes    Comment: pt reports drinking 1 pint  per week  . Drug use: No  . Sexual activity: Not Currently  Other Topics Concern  . Not on file  Social History Narrative  . Not on file   Social Determinants of Health   Financial Resource Strain:   . Difficulty of Paying Living Expenses: Not on file  Food Insecurity:   . Worried About Charity fundraiser in the Last Year: Not on file  . Ran Out of Food in the Last Year: Not on file  Transportation Needs:   . Lack of Transportation (Medical): Not on file  . Lack of Transportation (Non-Medical): Not on file  Physical Activity:   . Days of Exercise per Week: Not on file  . Minutes of Exercise per Session: Not on file  Stress:   . Feeling of Stress : Not on file  Social Connections:   . Frequency of Communication with Friends and Family: Not on file  . Frequency of Social Gatherings with Friends and Family: Not on file  . Attends Religious Services: Not on file  . Active Member of Clubs or Organizations: Not on file  . Attends Archivist Meetings: Not on file  . Marital Status: Not on file     Observations/Objective: Appears well in NAD Breathing normally Skin  appears warm and dry  Assessment and Plan:  See Problem List for Assessment and Plan of chronic medical problems.   Follow Up Instructions:    I discussed the assessment and treatment plan with the patient. The patient was provided an opportunity to ask questions and all were answered. The patient agreed with the plan and demonstrated an understanding of the instructions.   The patient was advised to call back or seek an in-person evaluation if the symptoms worsen or if the condition fails to improve as anticipated.    Binnie Rail, MD

## 2020-08-23 ENCOUNTER — Telehealth (INDEPENDENT_AMBULATORY_CARE_PROVIDER_SITE_OTHER): Payer: Medicare Other | Admitting: Internal Medicine

## 2020-08-23 ENCOUNTER — Encounter: Payer: Self-pay | Admitting: Internal Medicine

## 2020-08-23 DIAGNOSIS — J329 Chronic sinusitis, unspecified: Secondary | ICD-10-CM

## 2020-08-23 MED ORDER — CEPHALEXIN 500 MG PO CAPS
500.0000 mg | ORAL_CAPSULE | Freq: Two times a day (BID) | ORAL | 0 refills | Status: AC
Start: 2020-08-23 — End: 2020-08-30

## 2020-08-23 NOTE — Assessment & Plan Note (Signed)
Likely bacterial given her history and contact with her granddaughter who had cold symptoms Start Keflex otc cold medications Rest, fluid Call if no improvement

## 2020-08-26 ENCOUNTER — Telehealth: Payer: Self-pay | Admitting: Internal Medicine

## 2020-08-26 NOTE — Telephone Encounter (Signed)
She does not need a new antibiotic - she needs to continue this one.    I do think she should get tested for covid to make sure that is not what this is.  Continue mucinex.  Can start claritin daily.

## 2020-08-26 NOTE — Telephone Encounter (Signed)
Patient made aware of Dr Quay Burow recommendation

## 2020-08-26 NOTE — Telephone Encounter (Signed)
cephALEXin (KEFLEX) 500 MG capsule  [id number] [place], [place] - [address] Phone:  [phone]  Fax:  [phone]     Patient would like something else called in for her cough... Cephalexin not working

## 2020-08-28 MED ORDER — PROMETHAZINE-CODEINE 6.25-10 MG/5ML PO SYRP: 5.0000 mL | ORAL_SOLUTION | Freq: Four times a day (QID) | ORAL | 0 refills | Status: DC | PRN

## 2020-08-28 MED ORDER — HYDROCODONE-HOMATROPINE 5-1.5 MG/5ML PO SYRP: 5.0000 mL | ORAL_SOLUTION | Freq: Four times a day (QID) | ORAL | 0 refills | Status: DC | PRN

## 2020-08-28 NOTE — Telephone Encounter (Signed)
    [facility] Please send to [facility] [place]

## 2020-08-28 NOTE — Addendum Note (Signed)
Addended by: Biagio Borg on: 08/28/2020 01:02 PM   Modules accepted: Orders

## 2020-08-28 NOTE — Telephone Encounter (Signed)
Patient states she had a covid test this week, and it is negative.  She is continuing to ask for a script for cough medicine. She is aware of the recommendations for Dr. Quay Burow.

## 2020-08-28 NOTE — Addendum Note (Signed)
Addended by: Biagio Borg on: 08/28/2020 08:54 PM   Modules accepted: Orders

## 2020-08-28 NOTE — Telephone Encounter (Signed)
Sent to Dr. John. 

## 2020-08-28 NOTE — Telephone Encounter (Signed)
Done erx 

## 2020-08-28 NOTE — Telephone Encounter (Signed)
Cough med done erx 

## 2020-08-29 ENCOUNTER — Ambulatory Visit (INDEPENDENT_AMBULATORY_CARE_PROVIDER_SITE_OTHER): Payer: Medicare Other | Admitting: *Deleted

## 2020-08-29 DIAGNOSIS — I639 Cerebral infarction, unspecified: Secondary | ICD-10-CM

## 2020-08-29 LAB — CUP PACEART REMOTE DEVICE CHECK
Date Time Interrogation Session: 20210902001512
Implantable Pulse Generator Implant Date: 20200528

## 2020-08-30 NOTE — Progress Notes (Signed)
Carelink Summary Report / Loop Recorder 

## 2020-09-01 ENCOUNTER — Ambulatory Visit (HOSPITAL_COMMUNITY)
Admission: EM | Admit: 2020-09-01 | Discharge: 2020-09-01 | Disposition: A | Discharge status: Home / Self Care | Payer: Medicare Other | Attending: Family Medicine | Admitting: Family Medicine

## 2020-09-01 ENCOUNTER — Ambulatory Visit (INDEPENDENT_AMBULATORY_CARE_PROVIDER_SITE_OTHER): Payer: Medicare Other

## 2020-09-01 ENCOUNTER — Encounter (HOSPITAL_COMMUNITY): Payer: Self-pay

## 2020-09-01 ENCOUNTER — Other Ambulatory Visit: Payer: Self-pay

## 2020-09-01 DIAGNOSIS — R0682 Tachypnea, not elsewhere classified: Secondary | ICD-10-CM | POA: Insufficient documentation

## 2020-09-01 DIAGNOSIS — R0602 Shortness of breath: Secondary | ICD-10-CM

## 2020-09-01 DIAGNOSIS — R062 Wheezing: Secondary | ICD-10-CM

## 2020-09-01 DIAGNOSIS — I739 Peripheral vascular disease, unspecified: Secondary | ICD-10-CM | POA: Diagnosis not present

## 2020-09-01 DIAGNOSIS — R059 Cough, unspecified: Secondary | ICD-10-CM

## 2020-09-01 DIAGNOSIS — Z7982 Long term (current) use of aspirin: Secondary | ICD-10-CM | POA: Insufficient documentation

## 2020-09-01 DIAGNOSIS — J449 Chronic obstructive pulmonary disease, unspecified: Secondary | ICD-10-CM | POA: Insufficient documentation

## 2020-09-01 DIAGNOSIS — R0981 Nasal congestion: Secondary | ICD-10-CM | POA: Diagnosis present

## 2020-09-01 DIAGNOSIS — Z881 Allergy status to other antibiotic agents status: Secondary | ICD-10-CM | POA: Diagnosis not present

## 2020-09-01 DIAGNOSIS — R05 Cough: Secondary | ICD-10-CM | POA: Diagnosis not present

## 2020-09-01 DIAGNOSIS — I10 Essential (primary) hypertension: Secondary | ICD-10-CM | POA: Insufficient documentation

## 2020-09-01 DIAGNOSIS — R5383 Other fatigue: Secondary | ICD-10-CM | POA: Diagnosis not present

## 2020-09-01 DIAGNOSIS — F17210 Nicotine dependence, cigarettes, uncomplicated: Secondary | ICD-10-CM | POA: Diagnosis not present

## 2020-09-01 DIAGNOSIS — Z79899 Other long term (current) drug therapy: Secondary | ICD-10-CM | POA: Insufficient documentation

## 2020-09-01 DIAGNOSIS — Z8249 Family history of ischemic heart disease and other diseases of the circulatory system: Secondary | ICD-10-CM | POA: Diagnosis not present

## 2020-09-01 DIAGNOSIS — I700 Atherosclerosis of aorta: Secondary | ICD-10-CM | POA: Diagnosis not present

## 2020-09-01 DIAGNOSIS — R6883 Chills (without fever): Secondary | ICD-10-CM | POA: Insufficient documentation

## 2020-09-01 DIAGNOSIS — J069 Acute upper respiratory infection, unspecified: Secondary | ICD-10-CM | POA: Insufficient documentation

## 2020-09-01 DIAGNOSIS — Z20822 Contact with and (suspected) exposure to covid-19: Secondary | ICD-10-CM | POA: Diagnosis not present

## 2020-09-01 DIAGNOSIS — I517 Cardiomegaly: Secondary | ICD-10-CM | POA: Diagnosis not present

## 2020-09-01 LAB — SARS CORONAVIRUS 2 (TAT 6-24 HRS): SARS Coronavirus 2: NEGATIVE

## 2020-09-01 IMAGING — DX DG CHEST 2V
2 series · 2 of 2 positions shown · non-contrast
Comparison: [DATE].

CLINICAL DATA: Cough, wheezing.

EXAM:
CHEST - 2 VIEW

[chest pa]
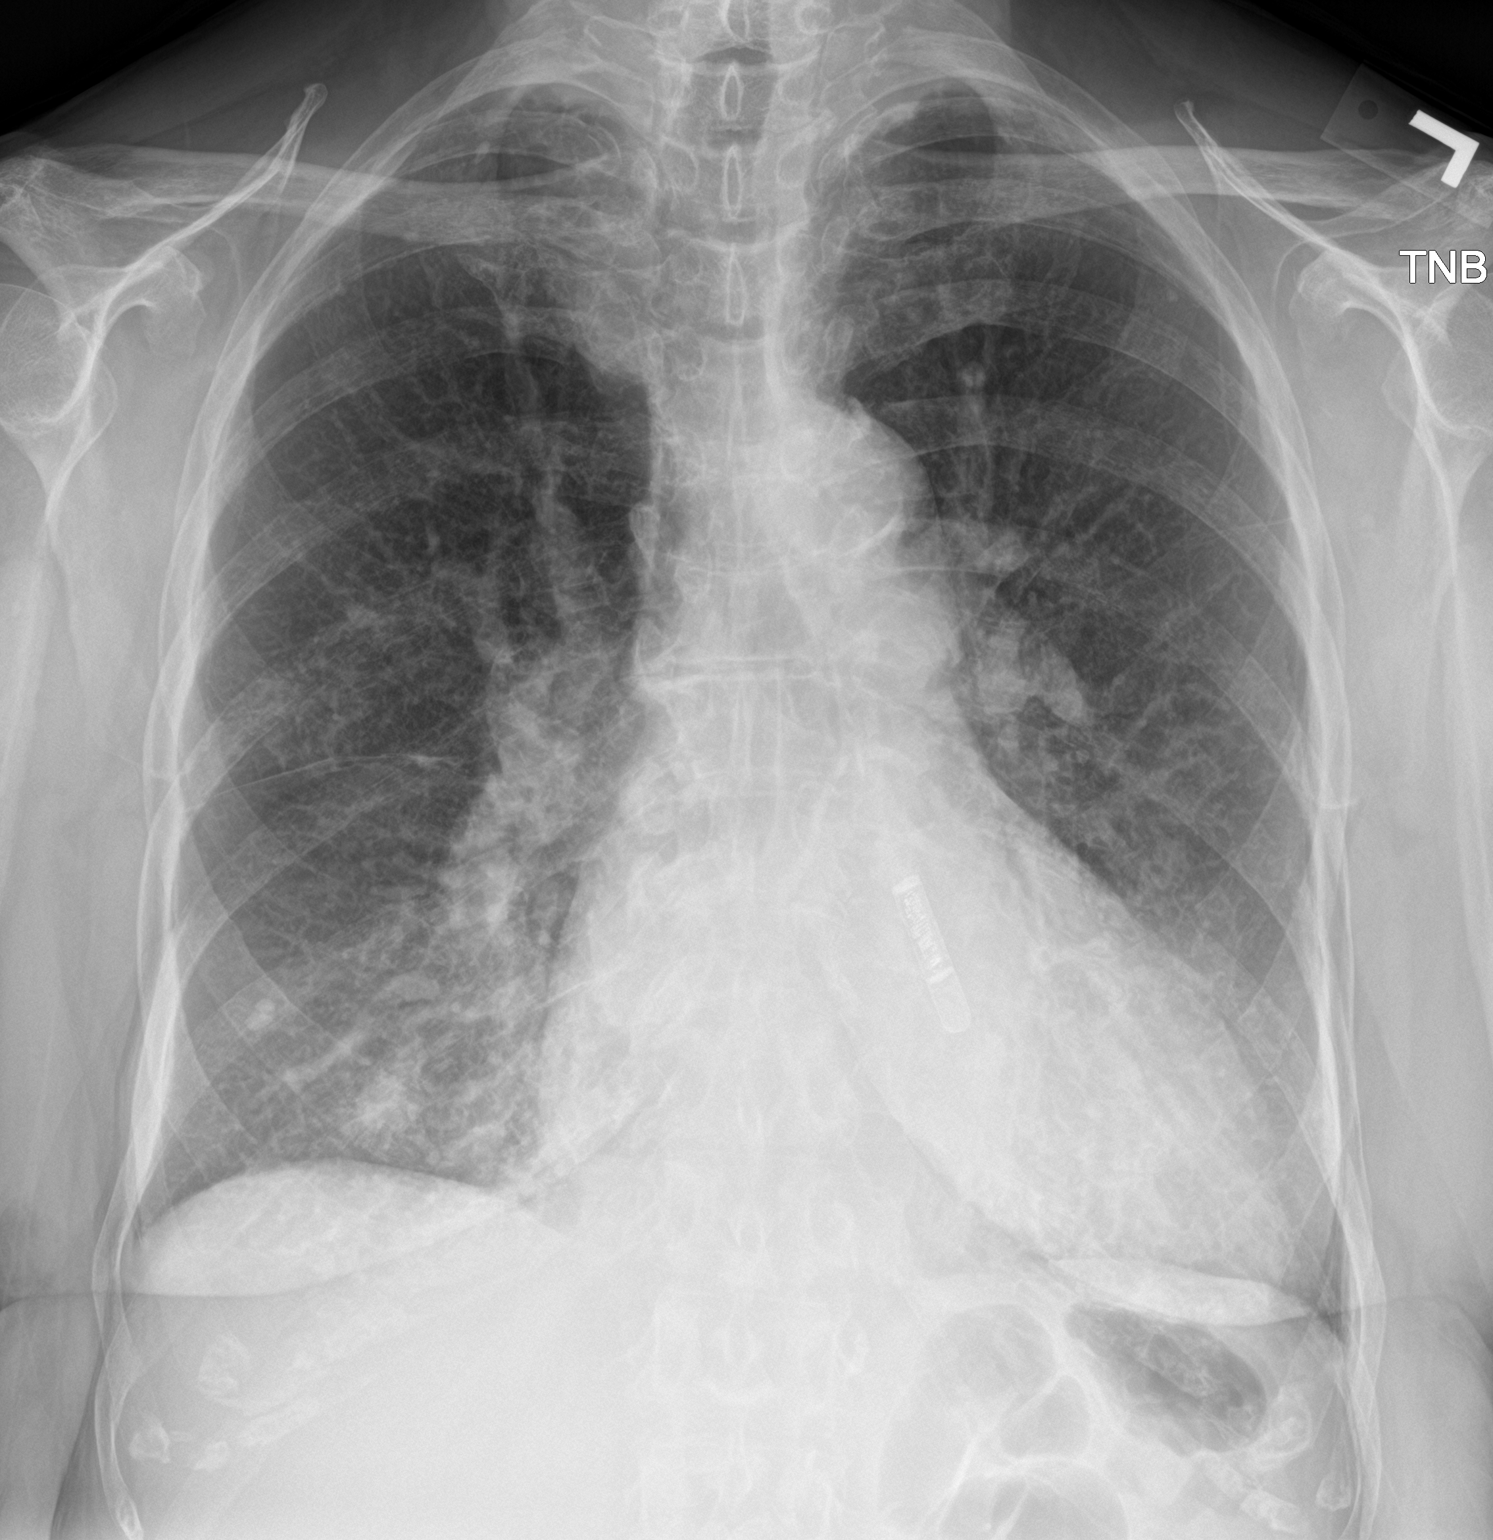

[chest lat]
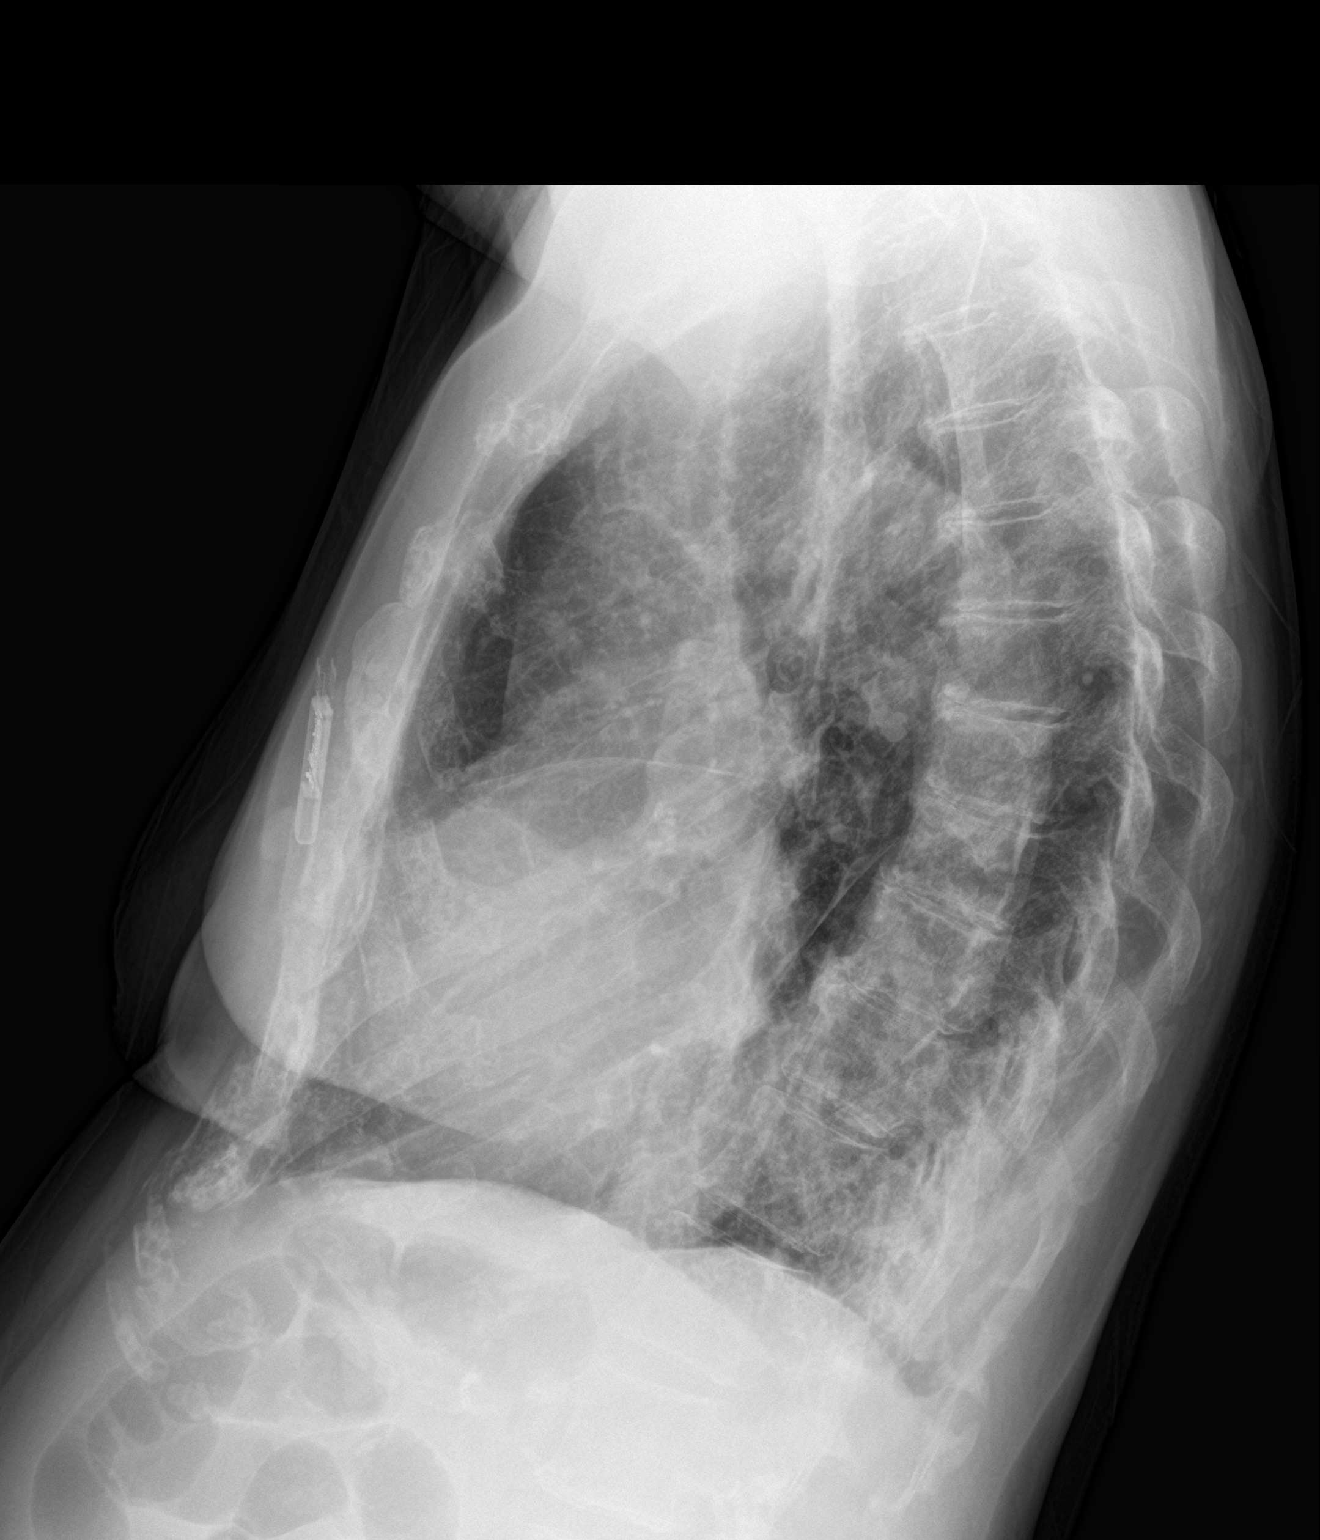

[2 of 2 positions shown; findings below may reference images not displayed]

FINDINGS: Stable cardiomegaly. No pneumothorax or pleural effusion is noted.
Mild bibasilar subsegmental atelectasis or edema is noted. Bony
thorax is unremarkable.
IMPRESSION: Mild bibasilar subsegmental atelectasis or edema.

Aortic Atherosclerosis ([BD]-[BD]).

## 2020-09-01 MED ORDER — PREDNISONE 10 MG (21) PO TBPK: ORAL_TABLET | Freq: Every day | ORAL | 0 refills | Status: AC

## 2020-09-01 MED ORDER — DOXYCYCLINE HYCLATE 100 MG PO CAPS
100.0000 mg | ORAL_CAPSULE | Freq: Two times a day (BID) | ORAL | 0 refills | Status: DC
Start: 2020-09-01 — End: 2020-09-10

## 2020-09-01 MED ORDER — BENZONATATE 100 MG PO CAPS: 100.0000 mg | ORAL_CAPSULE | Freq: Three times a day (TID) | ORAL | 0 refills | Status: DC

## 2020-09-01 NOTE — ED Provider Notes (Addendum)
[facility]   [id number] 09/01/20 Arrival Time: 69   CC: COVID symptoms  SUBJECTIVE: History from: patient.  Elaine Turner is a 74 y.o. female who presents with abrupt onset of nasal congestion, PND, and productive cough with brown sputum for the last week. Reports that she has hx pneumonia. Reports fatigue, SOB, chest tightness as well. Denies sick exposure to COVID, flu or strep. Denies recent travel. Has negative history of Covid. Has completed Covid vaccines. Has taken mucinex with no relief. Symptoms are aggravated by activity. Denies fever,  sinus pain, rhinorrhea, sore throat, chest pain, nausea, changes in bowel or bladder habits.    ROS: As per HPI.  All other pertinent ROS negative.     Past Medical History:  Diagnosis Date  . 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 Aortic atherosclerosis ([place])   . Chronic diarrhea   . COPD  GOLD 0 / active smoker  10/24/2015  . Diverticulitis   . Diverticulosis   . Esophageal stricture   . Heart murmur   . Hemorrhoids   . Hyperglycemia 05/01/2013  . Hypertension   . 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 Pneumonia 05/01/2013  . PVD (peripheral vascular disease) ([place])   . Urine incontinence    Past Surgical History:  Procedure Laterality Date  . ABDOMINAL HYSTERECTOMY    . CARPAL TUNNEL RELEASE Right 09/2019  . HEMORRHOID SURGERY N/A 06/26/2015   Procedure: INTERNAL AND EXTERNAL HEMORRHOIDECTOMY;  Surgeon: Georganna Skeans, MD;  Location: [facility];  Service: General;  Laterality: N/A;  . LOOP RECORDER INSERTION N/A 05/25/2019   Procedure: LOOP RECORDER INSERTION;  Surgeon: Constance Haw, MD;  Location: [facility];  Service: Cardiovascular;  Laterality: N/A;   Allergies  Allergen Reactions  . Ace Inhibitors Cough  . Levaquin [Levofloxacin] Other (See Comments)    GI upset   No current facility-administered medications on  file prior to encounter.   Current Outpatient Medications on File Prior to Encounter  Medication Sig Dispense Refill  . aspirin 81 MG EC tablet Take 1 tablet (81 mg total) by mouth daily. Swallow whole. 30 tablet 12  . atorvastatin (LIPITOR) 40 MG tablet TAKE 1 TABLET (40 MG TOTAL) BY MOUTH DAILY AT 6 PM. 90 tablet 3  . clopidogrel (PLAVIX) 75 MG tablet Take 1 tablet (75 mg total) by mouth daily. 90 tablet 3  . feeding supplement, ENSURE ENLIVE, (ENSURE ENLIVE) LIQD Take 237 mLs by mouth daily at 3 pm. 237 mL 12  . losartan (COZAAR) 50 MG tablet Take 1 tablet (50 mg total) by mouth daily. 90 tablet 3  . NIFEdipine (PROCARDIA XL/NIFEDICAL-XL) 90 MG 24 hr tablet TAKE 1 TABLET BY MOUTH EVERY DAY 90 tablet 2  . tiZANidine (ZANAFLEX) 2 MG tablet TAKE 1 TABLET (2 MG TOTAL) BY MOUTH EVERY 6 (SIX) HOURS AS NEEDED FOR MUSCLE SPASMS. 30 tablet 2  . zolpidem (AMBIEN) 5 MG tablet TAKE 1 TABLET (5 MG TOTAL) BY MOUTH AT BEDTIME AS NEEDED FOR SLEEP. 90 tablet 1  . [DISCONTINUED] citalopram (CELEXA) 10 MG tablet Take 1 tablet (10 mg total) by mouth daily. 90 tablet 3  . [DISCONTINUED] fluticasone (FLONASE) 50 MCG/ACT nasal spray Place 2 sprays into both nostrils daily. (Patient taking differently: Place 2 sprays into both nostrils as needed for rhinitis. ) 16 g 6   Social History   Socioeconomic History  . Marital status: Married  Spouse name: Not on file  . Number of children: Not on file  . Years of education: 22  . Highest education level: Not on file  Occupational History  . Occupation: retired  Tobacco Use  . Smoking status: Current Every Day Smoker    Packs/day: 1.00    Years: 62.00    Pack years: 62.00    Types: Cigarettes  . Smokeless tobacco: Never Used  Vaping Use  . Vaping Use: Never used  Substance and Sexual Activity  . Alcohol use: Yes    Comment: pt reports drinking 1 pint  per week  . Drug use: No  . Sexual activity: Not Currently  Other Topics Concern  . Not on file   Social History Narrative  . Not on file   Social Determinants of Health   Financial Resource Strain:   . Difficulty of Paying Living Expenses: Not on file  Food Insecurity:   . Worried About Charity fundraiser in the Last Year: Not on file  . Ran Out of Food in the Last Year: Not on file  Transportation Needs:   . Lack of Transportation (Medical): Not on file  . Lack of Transportation (Non-Medical): Not on file  Physical Activity:   . Days of Exercise per Week: Not on file  . Minutes of Exercise per Session: Not on file  Stress:   . Feeling of Stress : Not on file  Social Connections:   . Frequency of Communication with Friends and Family: Not on file  . Frequency of Social Gatherings with Friends and Family: Not on file  . Attends Religious Services: Not on file  . Active Member of Clubs or Organizations: Not on file  . Attends Archivist Meetings: Not on file  . Marital Status: Not on file  Intimate Partner Violence:   . Fear of Current or Ex-Partner: Not on file  . Emotionally Abused: Not on file  . Physically Abused: Not on file  . Sexually Abused: Not on file   Family History  Problem Relation Age of Onset  . Hypertension Mother   . Lung cancer Father   . Colon cancer Neg Hx   . Esophageal cancer Neg Hx   . Pancreatic cancer Neg Hx   . Liver disease Neg Hx   . Stroke Neg Hx     OBJECTIVE:  Vitals:   09/01/20 1155 09/01/20 1201  BP:  (!) 152/64  Pulse:  97  Resp:  (!) 24  Temp:  98.8 F (37.1 C)  TempSrc:  Oral  SpO2: 96% 96%     General appearance: alert; appears fatigued, but nontoxic; speaking in full sentences and tolerating own secretions HEENT: NCAT; Ears: EACs clear, TMs pearly gray; Eyes: PERRL.  EOM grossly intact. Sinuses: nontender; Nose: nares patent without rhinorrhea, Throat: oropharynx clear, tonsils non erythematous or enlarged, uvula midline  Neck: supple without LAD Lungs: unlabored respirations, symmetrical air entry;  cough: moderate; no respiratory distress; coarse lung sounds, diminished in bilateral bases Heart: regular rate and rhythm.  Radial pulses 2+ symmetrical bilaterally Skin: warm and dry Psychological: alert and cooperative; normal mood and affect  LABS:  No results found for this or any previous visit (from the past 24 hour(s)).   ASSESSMENT & PLAN:  1. Upper respiratory tract infection, unspecified type   2. Cough   3. SOB (shortness of breath)   4. Other fatigue   5. Chills   6. Tachypnea     Meds ordered this  encounter  Medications  . doxycycline (VIBRAMYCIN) 100 MG capsule    Sig: Take 1 capsule (100 mg total) by mouth 2 (two) times daily.    Dispense:  14 capsule    Refill:  0    Order Specific Question:   Supervising Provider    Answer:   Chase Picket [id number]  . predniSONE (STERAPRED UNI-PAK 21 TAB) 10 MG (21) TBPK tablet    Sig: Take by mouth daily for 6 days. Take 6 tablets on day 1, 5 tablets on day 2, 4 tablets on day 3, 3 tablets on day 4, 2 tablets on day 5, 1 tablet on day 6    Dispense:  21 tablet    Refill:  0    Order Specific Question:   Supervising Provider    Answer:   Chase Picket [id number]  . benzonatate (TESSALON) 100 MG capsule    Sig: Take 1 capsule (100 mg total) by mouth every 8 (eight) hours.    Dispense:  21 capsule    Refill:  0    Order Specific Question:   Supervising Provider    Answer:   Chase Picket [id number]    Chest x-ray shows "Mild bibasilar subsegmental atelectasis or edema"  Will treat for URI  Prescribe doxycycline Prescribe steroid taper Prescribed Tessalon Perles COVID testing ordered.  It will take between 1-2 days for test results.  Someone will contact you regarding abnormal results.    Patient should remain in quarantine until they have received Covid results.  If negative you may resume normal activities (go back to work/school) while practicing hand hygiene, social distance, and mask wearing.  If  positive, patient should remain in quarantine for 10 days from symptom onset AND greater than 72 hours after symptoms resolution (absence of fever without the use of fever-reducing medication and improvement in respiratory symptoms), whichever is longer Get plenty of rest and push fluids Use OTC zyrtec for nasal congestion, runny nose, and/or sore throat Use OTC flonase for nasal congestion and runny nose Use medications daily for symptom relief Use OTC medications like ibuprofen or tylenol as needed fever or pain Call or go to the ED if you have any new or worsening symptoms such as fever, worsening cough, shortness of breath, chest tightness, chest pain, turning blue, changes in mental status.  Reviewed expectations re: course of current medical issues. Questions answered. Outlined signs and symptoms indicating need for more acute intervention. Patient verbalized understanding. After Visit Summary given.         Faustino Congress, NP 09/01/20 [place]    Faustino Congress, NP 09/01/20 1443

## 2020-09-01 NOTE — ED Triage Notes (Signed)
Pt states cough and nasal congestion for about a week and a half. Pt says sputum is brown in color and cough is production. Pt complains of chills but no fever. Pt is short of breath particularly after coughing episodes. Pt ao x 3. Pt is ambulatory.

## 2020-09-01 NOTE — Discharge Instructions (Addendum)
Your COVID test is pending.  You should self quarantine until the test result is back.    I have prescribed doxycycline for you to take twice a day for 7 days  I have sent in a prednisone taper for you to take for 6 days. 6 tablets on day one, 5 tablets on day two, 4 tablets on day three, 3 tablets on day four, 2 tablets on day five, and 1 tablet on day six.  I have also sent in [facility] for you to take for cough.  Take Tylenol as needed for fever or discomfort.  Rest and keep yourself hydrated.    Go to the emergency department if you develop acute worsening symptoms.

## 2020-09-03 NOTE — Telephone Encounter (Signed)
Pt called and said that the [facility] on [address] called and told her that she was not in their system and that they could not fill the medication, she was wondering if there was anything else that can be called in.

## 2020-09-03 NOTE — Telephone Encounter (Signed)
Sent to Dr. John to advise. 

## 2020-09-04 NOTE — Telephone Encounter (Signed)
I am so confused  First I did not see this patient  Second the first cough syrup was not in stock  Third the patient is "not in their system" so [facility] unable to fill the cough med I sent in  Fourth the pt has tessalon perle from the [facility] ED visit  I dont see how I am able to help this pt further

## 2020-09-04 NOTE — Telephone Encounter (Signed)
    Patient states she is still dealing with a persistent cough, requesting medication be prescribed Virtual visit 8/27 with Dr Quay Burow Patient also seen in ED 9/5

## 2020-09-05 NOTE — Telephone Encounter (Signed)
    Patient made aware Dr Jenny Reichmann to help further. Patient advised to contact [facility] to seek out process to be added to their system

## 2020-09-10 ENCOUNTER — Ambulatory Visit (INDEPENDENT_AMBULATORY_CARE_PROVIDER_SITE_OTHER): Payer: Medicare Other | Admitting: Internal Medicine

## 2020-09-10 ENCOUNTER — Encounter: Payer: Self-pay | Admitting: Internal Medicine

## 2020-09-10 DIAGNOSIS — R739 Hyperglycemia, unspecified: Secondary | ICD-10-CM

## 2020-09-10 DIAGNOSIS — B370 Candidal stomatitis: Secondary | ICD-10-CM | POA: Diagnosis not present

## 2020-09-10 DIAGNOSIS — J069 Acute upper respiratory infection, unspecified: Secondary | ICD-10-CM

## 2020-09-10 MED ORDER — NYSTATIN 100000 UNIT/ML MT SUSP: 500000.0000 [IU] | Freq: Four times a day (QID) | OROMUCOSAL | 1 refills | Status: AC

## 2020-09-10 MED ORDER — ALBUTEROL SULFATE HFA 108 (90 BASE) MCG/ACT IN AERS: 2.0000 | INHALATION_SPRAY | Freq: Four times a day (QID) | RESPIRATORY_TRACT | 2 refills | Status: DC | PRN

## 2020-09-10 MED ORDER — HYDROCODONE-HOMATROPINE 5-1.5 MG/5ML PO SYRP
5.0000 mL | ORAL_SOLUTION | Freq: Four times a day (QID) | ORAL | 0 refills | Status: AC | PRN
Start: 2020-09-10 — End: 2020-09-20

## 2020-09-10 MED ORDER — SULFAMETHOXAZOLE-TRIMETHOPRIM 800-160 MG PO TABS: 1.0000 | ORAL_TABLET | Freq: Two times a day (BID) | ORAL | 0 refills | Status: DC

## 2020-09-10 NOTE — Progress Notes (Signed)
   Subjective:    Patient ID: Elaine Turner, female    DOB: 29-Mar-1946, 74 y.o.   MRN: [id number]  HPI    Review of Systems     Objective:   Physical Exam        Assessment & Plan:

## 2020-09-11 ENCOUNTER — Encounter: Payer: Self-pay | Admitting: Internal Medicine

## 2020-09-11 NOTE — Progress Notes (Signed)
Patient ID: Elaine Turner, female   DOB: [birth year], 74 y.o.   MRN: [id number]  Virtual Visit via Video Note  I connected with Leonie Green Gerardo on 09/10/2020 at  9:20 AM EDT by a video enabled telemedicine application and verified that I am speaking with the correct person using two identifiers.  Location of all participants today Patient: at home Provider: at office   I discussed the limitations of evaluation and management by telemedicine and the availability of in person appointments. The patient expressed understanding and agreed to proceed.  History of Present Illness:  Here with acute onset mild to mod 2-3 days ST, HA, general weakness and malaise, with prod cough greenish sputum, but Pt denies chest pain, increased sob or doe, wheezing, orthopnea, PND, increased LE swelling, palpitations, dizziness or syncope.  Also with tongue rash and bad taste for several days. Past Medical History:  Diagnosis Date  . 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 Aortic atherosclerosis ([place])   . Chronic diarrhea   . COPD  GOLD 0 / active smoker  10/24/2015  . Diverticulitis   . Diverticulosis   . Esophageal stricture   . Heart murmur   . Hemorrhoids   . Hyperglycemia 05/01/2013  . Hypertension   . 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 Pneumonia 05/01/2013  . PVD (peripheral vascular disease) ([place])   . Urine incontinence    Past Surgical History:  Procedure Laterality Date  . ABDOMINAL HYSTERECTOMY    . CARPAL TUNNEL RELEASE Right 09/2019  . HEMORRHOID SURGERY N/A 06/26/2015   Procedure: INTERNAL AND EXTERNAL HEMORRHOIDECTOMY;  Surgeon: Georganna Skeans, MD;  Location: [place];  Service: General;  Laterality: N/A;  . LOOP RECORDER INSERTION N/A 05/25/2019   Procedure: LOOP RECORDER INSERTION;  Surgeon: Constance Haw, MD;  Location: [facility];  Service: Cardiovascular;  Laterality: N/A;    reports  that she has been smoking cigarettes. She has a 62.00 pack-year smoking history. She has never used smokeless tobacco. She reports current alcohol use. She reports that she does not use drugs. family history includes Hypertension in her mother; Lung cancer in her father. Allergies  Allergen Reactions  . Ace Inhibitors Cough  . Levaquin [Levofloxacin] Other (See Comments)    GI upset   Current Outpatient Medications on File Prior to Visit  Medication Sig Dispense Refill  . aspirin 81 MG EC tablet Take 1 tablet (81 mg total) by mouth daily. Swallow whole. 30 tablet 12  . atorvastatin (LIPITOR) 40 MG tablet TAKE 1 TABLET (40 MG TOTAL) BY MOUTH DAILY AT 6 PM. 90 tablet 3  . benzonatate (TESSALON) 100 MG capsule Take 1 capsule (100 mg total) by mouth every 8 (eight) hours. 21 capsule 0  . clopidogrel (PLAVIX) 75 MG tablet Take 1 tablet (75 mg total) by mouth daily. 90 tablet 3  . feeding supplement, ENSURE ENLIVE, (ENSURE ENLIVE) LIQD Take 237 mLs by mouth daily at 3 pm. 237 mL 12  . losartan (COZAAR) 50 MG tablet Take 1 tablet (50 mg total) by mouth daily. 90 tablet 3  . NIFEdipine (PROCARDIA XL/NIFEDICAL-XL) 90 MG 24 hr tablet TAKE 1 TABLET BY MOUTH EVERY DAY 90 tablet 2  . tiZANidine (ZANAFLEX) 2 MG tablet TAKE 1 TABLET (2 MG TOTAL) BY MOUTH EVERY 6 (SIX) HOURS AS NEEDED FOR MUSCLE SPASMS. 30 tablet 2  . zolpidem (AMBIEN) 5 MG tablet TAKE 1 TABLET (5 MG  TOTAL) BY MOUTH AT BEDTIME AS NEEDED FOR SLEEP. 90 tablet 1  . [DISCONTINUED] citalopram (CELEXA) 10 MG tablet Take 1 tablet (10 mg total) by mouth daily. 90 tablet 3  . [DISCONTINUED] fluticasone (FLONASE) 50 MCG/ACT nasal spray Place 2 sprays into both nostrils daily. (Patient taking differently: Place 2 sprays into both nostrils as needed for rhinitis. ) 16 g 6   No current facility-administered medications on file prior to visit.    Observations/Objective: Alert, NAD, appropriate mood and affect, resps normal, cn 2-12 intact, moves all 4s,  no visible rash or swelling Lab Results  Component Value Date   WBC 10.7 (H) 01/24/2020   HGB 10.5 Repeated and verified X2. (L) 01/24/2020   HCT 33.3 (L) 01/24/2020   PLT 452.0 (H) 01/24/2020   GLUCOSE 85 01/24/2020   CHOL 161 01/24/2020   TRIG 69.0 01/24/2020   HDL 79.40 01/24/2020   LDLCALC 68 01/24/2020   ALT 14 01/24/2020   AST 18 01/24/2020   NA 137 01/24/2020   K 3.7 01/24/2020   CL 106 01/24/2020   CREATININE 1.44 (H) 01/24/2020   BUN 22 01/24/2020   CO2 22 01/24/2020   TSH 0.60 01/24/2020   INR 1.1 05/24/2019   HGBA1C 5.9 01/24/2020   Assessment and Plan: See notes  Follow Up Instructions: See notes   I discussed the assessment and treatment plan with the patient. The patient was provided an opportunity to ask questions and all were answered. The patient agreed with the plan and demonstrated an understanding of the instructions.   The patient was advised to call back or seek an in-person evaluation if the symptoms worsen or if the condition fails to improve as anticipated.   Cathlean Cower, MD

## 2020-09-11 NOTE — Assessment & Plan Note (Addendum)
Mild to mod, for antibx course,  to f/u any worsening symptoms or concerns  I spent 21 minutes in preparing to see the patient by review of recent labs, imaging and procedures, obtaining and reviewing separately obtained history, communicating with the patient and family or caregiver, ordering medications, tests or procedures, and documenting clinical information in the EHR including the differential Dx, treatment, and any further evaluation and other management of uri, hyperglycemia, thrush

## 2020-09-11 NOTE — Patient Instructions (Signed)
Please take all new medication as prescribed 

## 2020-09-11 NOTE — Assessment & Plan Note (Signed)
stable overall by history and exam, recent data reviewed with pt, and pt to continue medical treatment as before,  to f/u any worsening symptoms or concerns  

## 2020-09-11 NOTE — Assessment & Plan Note (Signed)
Mild to mod, for antibx course,  to f/u any worsening symptoms or concerns 

## 2020-09-18 ENCOUNTER — Telehealth: Payer: Self-pay | Admitting: Internal Medicine

## 2020-09-18 NOTE — Telephone Encounter (Signed)
    Pt c/o medication issue:  1. Name of Medication: Sulfamethoxazole-Trimethoprim 800-160 MG    2. How are you currently taking this medication (dosage and times per day)? Patient has stopped taking medication 3. Are you having a reaction (difficulty breathing--STAT)? no  4. What is your medication issue? Patient reports medication is causing diarrhea

## 2020-09-18 NOTE — Telephone Encounter (Signed)
Sent to Dr. John. 

## 2020-09-18 NOTE — Telephone Encounter (Signed)
Pt has an OV scheduled in November but wanted to know what she should do about the diarrhea/gas/bloating for now. Discussed with pt that she should contact her PCP. Reports she was on an antibiotic about a month ago and started having the diarrhea a few weeks ago. Pt will contact her PCP for now.

## 2020-09-19 NOTE — Telephone Encounter (Signed)
Ok to stop for now  She may have had enough for now, and we can hold on change to a different antibiotic   Ok for OTC probiotic called Align (or similar)  Pt should call back for any worsening fever, abd or other pain, persistent particularly foul smelling diarrhea or blood, or other unusual symptoms

## 2020-09-19 NOTE — Telephone Encounter (Signed)
Patient is calling to follow up. She would like a call back.   I informed patient Dr.John is out of office. She would like to know if another doctor is going to advise.

## 2020-09-23 NOTE — Telephone Encounter (Signed)
Attempted to call pt and inform her of Dr. Gwynn Burly note. Pt telephone is disconnected at this time.

## 2020-10-01 ENCOUNTER — Ambulatory Visit (INDEPENDENT_AMBULATORY_CARE_PROVIDER_SITE_OTHER): Payer: Medicare Other

## 2020-10-01 DIAGNOSIS — I639 Cerebral infarction, unspecified: Secondary | ICD-10-CM

## 2020-10-01 LAB — CUP PACEART REMOTE DEVICE CHECK
Date Time Interrogation Session: 20211005001933
Implantable Pulse Generator Implant Date: 20200528

## 2020-10-03 ENCOUNTER — Encounter: Payer: Self-pay | Admitting: Internal Medicine

## 2020-10-03 ENCOUNTER — Other Ambulatory Visit: Payer: Self-pay

## 2020-10-03 ENCOUNTER — Ambulatory Visit (INDEPENDENT_AMBULATORY_CARE_PROVIDER_SITE_OTHER): Payer: Medicare Other | Admitting: Internal Medicine

## 2020-10-03 VITALS — BP 160/90 | HR 80 | Temp 98.5°F | Ht 62.0 in | Wt 124.0 lb

## 2020-10-03 DIAGNOSIS — J449 Chronic obstructive pulmonary disease, unspecified: Secondary | ICD-10-CM

## 2020-10-03 DIAGNOSIS — M545 Low back pain, unspecified: Secondary | ICD-10-CM

## 2020-10-03 DIAGNOSIS — R739 Hyperglycemia, unspecified: Secondary | ICD-10-CM

## 2020-10-03 DIAGNOSIS — R5383 Other fatigue: Secondary | ICD-10-CM | POA: Insufficient documentation

## 2020-10-03 DIAGNOSIS — I10 Essential (primary) hypertension: Secondary | ICD-10-CM

## 2020-10-03 DIAGNOSIS — N183 Chronic kidney disease, stage 3 unspecified: Secondary | ICD-10-CM

## 2020-10-03 DIAGNOSIS — R1032 Left lower quadrant pain: Secondary | ICD-10-CM

## 2020-10-03 MED ORDER — SENNOSIDES 8.6 MG PO TABS: 1.0000 | ORAL_TABLET | Freq: Every day | ORAL | 3 refills | Status: DC

## 2020-10-03 MED ORDER — LACTULOSE 10 GM/15ML PO SOLN: 20.0000 g | Freq: Two times a day (BID) | ORAL | 0 refills | Status: DC | PRN

## 2020-10-03 NOTE — Progress Notes (Signed)
Carelink Summary Report / Loop Recorder 

## 2020-10-03 NOTE — Progress Notes (Signed)
Subjective:    Patient ID: Elaine Turner, female    DOB: 1946/06/02, 74 y.o.   MRN: [id number]  HPI  Here with left low back pain dull intermittent x 2 wk, worse to stand up, twist about and bending, nothing really makes better.  ALso with c/o LLQ pain, dull, pressure like for several months, mild to severe intermittent, with occasional nausea and ongoing constipation and gas, afraid to try anything otc, Has appt in Nov 2021 with Dr Perry/GI.   Pt denies fever, wt loss, night sweats, loss of appetite, or other constitutional symptoms  Pt denies chest pain, increased sob or doe, wheezing, orthopnea, PND, increased LE swelling, palpitations, dizziness or syncope.   Pt denies polydipsia, polyuria.  Does c/o ongoing fatigue, but denies signficant daytime hypersomnolence. Past Medical History:  Diagnosis Date  . 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 Aortic atherosclerosis ([place])   . Chronic diarrhea   . COPD  GOLD 0 / active smoker  10/24/2015  . Diverticulitis   . Diverticulosis   . Esophageal stricture   . Heart murmur   . Hemorrhoids   . Hyperglycemia 05/01/2013  . Hypertension   . 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 Pneumonia 05/01/2013  . PVD (peripheral vascular disease) ([place])   . Urine incontinence    Past Surgical History:  Procedure Laterality Date  . ABDOMINAL HYSTERECTOMY    . CARPAL TUNNEL RELEASE Right 09/2019  . HEMORRHOID SURGERY N/A 06/26/2015   Procedure: INTERNAL AND EXTERNAL HEMORRHOIDECTOMY;  Surgeon: Georganna Skeans, MD;  Location: [place];  Service: General;  Laterality: N/A;  . LOOP RECORDER INSERTION N/A 05/25/2019   Procedure: LOOP RECORDER INSERTION;  Surgeon: Constance Haw, MD;  Location: [facility];  Service: Cardiovascular;  Laterality: N/A;    reports that she has been smoking cigarettes. She has a 62.00 pack-year smoking history. She has never used smokeless  tobacco. She reports current alcohol use. She reports that she does not use drugs. family history includes Hypertension in her mother; Lung cancer in her father. Allergies  Allergen Reactions  . Ace Inhibitors Cough  . Levaquin [Levofloxacin] Other (See Comments)    GI upset   Current Outpatient Medications on File Prior to Visit  Medication Sig Dispense Refill  . albuterol (VENTOLIN HFA) 108 (90 Base) MCG/ACT inhaler Inhale 2 puffs into the lungs every 6 (six) hours as needed for wheezing or shortness of breath. 8 g 2  . aspirin 81 MG EC tablet Take 1 tablet (81 mg total) by mouth daily. Swallow whole. 30 tablet 12  . atorvastatin (LIPITOR) 40 MG tablet TAKE 1 TABLET (40 MG TOTAL) BY MOUTH DAILY AT 6 PM. 90 tablet 3  . benzonatate (TESSALON) 100 MG capsule Take 1 capsule (100 mg total) by mouth every 8 (eight) hours. 21 capsule 0  . clopidogrel (PLAVIX) 75 MG tablet Take 1 tablet (75 mg total) by mouth daily. 90 tablet 3  . feeding supplement, ENSURE ENLIVE, (ENSURE ENLIVE) LIQD Take 237 mLs by mouth daily at 3 pm. 237 mL 12  . losartan (COZAAR) 50 MG tablet Take 1 tablet (50 mg total) by mouth daily. 90 tablet 3  . NIFEdipine (PROCARDIA XL/NIFEDICAL-XL) 90 MG 24 hr tablet TAKE 1 TABLET BY MOUTH EVERY DAY 90 tablet 2  . zolpidem (AMBIEN) 5 MG tablet TAKE 1 TABLET (5 MG TOTAL) BY MOUTH AT BEDTIME AS NEEDED FOR SLEEP. [place]  tablet 1  . [DISCONTINUED] citalopram (CELEXA) 10 MG tablet Take 1 tablet (10 mg total) by mouth daily. 90 tablet 3  . [DISCONTINUED] fluticasone (FLONASE) 50 MCG/ACT nasal spray Place 2 sprays into both nostrils daily. (Patient taking differently: Place 2 sprays into both nostrils as needed for rhinitis. ) 16 g 6   No current facility-administered medications on file prior to visit.   Review of Systems All otherwise neg per pt     Objective:   Physical Exam BP (!) 160/90 (BP Location: Left Arm, Patient Position: Sitting, Cuff Size: Large)   Pulse 80   Temp 98.5 F  (36.9 C) (Oral)   Ht 5\' 2"  (1.575 m)   Wt 124 lb (56.2 kg)   SpO2 98%   BMI 22.68 kg/m  VS noted,  Constitutional: Pt appears in NAD HENT: Head: NCAT.  Right Ear: External ear normal.  Left Ear: External ear normal.  Eyes: . Pupils are equal, round, and reactive to light. Conjunctivae and EOM are normal Nose: without d/c or deformity Neck: Neck supple. Gross normal ROM Cardiovascular: Normal rate and regular rhythm.   Pulmonary/Chest: Effort normal and breath sounds without rales or wheezing.  Abd:  Soft, ND, + BS, no organomegaly, with mild "deep" llq tender LS spine nontender in midline, then left lower ack tedner spasm noted Neurological: Pt is alert. At baseline orientation, motor grossly intact Skin: Skin is warm. No rashes, other new lesions, no LE edema Psychiatric: Pt behavior is normal without agitation  All otherwise neg per pt  Lab Results  Component Value Date   WBC 10.7 (H) 01/24/2020   HGB 10.5 Repeated and verified X2. (L) 01/24/2020   HCT 33.3 (L) 01/24/2020   PLT 452.0 (H) 01/24/2020   GLUCOSE 85 01/24/2020   CHOL 161 01/24/2020   TRIG 69.0 01/24/2020   HDL 79.40 01/24/2020   LDLCALC 68 01/24/2020   ALT 14 01/24/2020   AST 18 01/24/2020   NA 137 01/24/2020   K 3.7 01/24/2020   CL 106 01/24/2020   CREATININE 1.44 (H) 01/24/2020   BUN 22 01/24/2020   CO2 22 01/24/2020   TSH 0.60 01/24/2020   INR 1.1 05/24/2019   HGBA1C 5.9 01/24/2020       Assessment & Plan:

## 2020-10-03 NOTE — Patient Instructions (Signed)
Please take all new medication as prescribed - the senakot, and/or the lactulose if needed  Please continue all other medications as before, and refills have been done if requested.  Please have the pharmacy call with any other refills you may need.  Please continue your efforts at being more active, low cholesterol diet, and weight control.  Please keep your appointments with your specialists as you may have planned  Please go to the LAB at the blood drawing area for the tests to be done - at the [facility] will be contacted by phone if any changes need to be made immediately.  Otherwise, you will receive a letter about your results with an explanation, but please check with MyChart first.  Please remember to sign up for MyChart if you have not done so, as this will be important to you in the future with finding out test results, communicating by private email, and scheduling acute appointments online when needed.  Please make an Appointment to return in 6 months, or sooner if needed

## 2020-10-04 ENCOUNTER — Other Ambulatory Visit: Payer: Self-pay | Admitting: Internal Medicine

## 2020-10-05 ENCOUNTER — Encounter: Payer: Self-pay | Admitting: Internal Medicine

## 2020-10-05 NOTE — Assessment & Plan Note (Signed)
Most likley c/w constipation, for lactulose and/or senakot prn,  to f/u any worsening symptoms or concerns

## 2020-10-05 NOTE — Assessment & Plan Note (Signed)
C/s msk spasm, for pain control,  to f/u any worsening symptoms or concerns

## 2020-10-05 NOTE — Assessment & Plan Note (Signed)
stable overall by history and exam, recent data reviewed with pt, and pt to continue medical treatment as before,  to f/u any worsening symptoms or concerns  

## 2020-10-05 NOTE — Assessment & Plan Note (Signed)
Mild elevated today, declines med chaanges

## 2020-10-05 NOTE — Assessment & Plan Note (Addendum)
etiolgoy unclear, for labs  I spent 41 minutes in preparing to see the patient by review of recent labs, imaging and procedures, obtaining and reviewing separately obtained history, communicating with the patient and family or caregiver, ordering medications, tests or procedures, and documenting clinical information in the EHR including the differential Dx, treatment, and any further evaluation and other management of fatigue, left lower back pain, llq pain, htn, hyperglycemia, ckd, copd

## 2020-10-08 ENCOUNTER — Other Ambulatory Visit (INDEPENDENT_AMBULATORY_CARE_PROVIDER_SITE_OTHER): Payer: Medicare Other

## 2020-10-08 DIAGNOSIS — M545 Low back pain, unspecified: Secondary | ICD-10-CM

## 2020-10-08 DIAGNOSIS — R1032 Left lower quadrant pain: Secondary | ICD-10-CM

## 2020-10-08 DIAGNOSIS — N183 Chronic kidney disease, stage 3 unspecified: Secondary | ICD-10-CM | POA: Diagnosis not present

## 2020-10-08 LAB — VITAMIN D 25 HYDROXY (VIT D DEFICIENCY, FRACTURES): VITD: 14.06 ng/mL — ABNORMAL LOW (ref 30.00–100.00)

## 2020-10-08 LAB — URINALYSIS, ROUTINE W REFLEX MICROSCOPIC
Bilirubin Urine: NEGATIVE
Hgb urine dipstick: NEGATIVE
Ketones, ur: NEGATIVE
Leukocytes,Ua: NEGATIVE
Nitrite: NEGATIVE
RBC / HPF: NONE SEEN (ref 0–?)
Specific Gravity, Urine: 1.02 (ref 1.000–1.030)
Total Protein, Urine: 30 — AB
Urine Glucose: NEGATIVE
Urobilinogen, UA: 0.2 (ref 0.0–1.0)
pH: 6 (ref 5.0–8.0)

## 2020-10-08 LAB — CBC WITH DIFFERENTIAL/PLATELET
Basophils Absolute: 0.1 10*3/uL (ref 0.0–0.1)
Basophils Relative: 0.8 % (ref 0.0–3.0)
Eosinophils Absolute: 0.1 10*3/uL (ref 0.0–0.7)
Eosinophils Relative: 1.2 % (ref 0.0–5.0)
HCT: 31.1 % — ABNORMAL LOW (ref 36.0–46.0)
Hemoglobin: 9.9 g/dL — ABNORMAL LOW (ref 12.0–15.0)
Lymphocytes Relative: 15.5 % (ref 12.0–46.0)
Lymphs Abs: 1.7 10*3/uL (ref 0.7–4.0)
MCHC: 32 g/dL (ref 30.0–36.0)
MCV: 71.6 fl — ABNORMAL LOW (ref 78.0–100.0)
Monocytes Absolute: 0.7 10*3/uL (ref 0.1–1.0)
Monocytes Relative: 6.5 % (ref 3.0–12.0)
Neutro Abs: 8.4 10*3/uL — ABNORMAL HIGH (ref 1.4–7.7)
Neutrophils Relative %: 76 % (ref 43.0–77.0)
Platelets: 585 10*3/uL — ABNORMAL HIGH (ref 150.0–400.0)
RBC: 4.35 Mil/uL (ref 3.87–5.11)
RDW: 18.5 % — ABNORMAL HIGH (ref 11.5–15.5)
WBC: 11.1 10*3/uL — ABNORMAL HIGH (ref 4.0–10.5)

## 2020-10-08 LAB — BASIC METABOLIC PANEL
BUN: 11 mg/dL (ref 6–23)
CO2: 28 mEq/L (ref 19–32)
Calcium: 9.4 mg/dL (ref 8.4–10.5)
Chloride: 102 mEq/L (ref 96–112)
Creatinine, Ser: 1.31 mg/dL — ABNORMAL HIGH (ref 0.40–1.20)
GFR: 39.27 mL/min — ABNORMAL LOW (ref >60.00)
Glucose, Bld: 103 mg/dL — ABNORMAL HIGH (ref 70–99)
Potassium: 3.7 mEq/L (ref 3.5–5.1)
Sodium: 141 mEq/L (ref 135–145)

## 2020-10-08 LAB — PHOSPHORUS: Phosphorus: 3.2 mg/dL (ref 2.3–4.6)

## 2020-10-08 LAB — HEPATIC FUNCTION PANEL
ALT: 9 U/L (ref 0–35)
AST: 14 U/L (ref 0–37)
Albumin: 3.9 g/dL (ref 3.5–5.2)
Alkaline Phosphatase: 78 U/L (ref 39–117)
Bilirubin, Direct: 0.1 mg/dL (ref 0.0–0.3)
Total Bilirubin: 0.4 mg/dL (ref 0.2–1.2)
Total Protein: 7.2 g/dL (ref 6.0–8.3)

## 2020-10-08 LAB — LIPASE: Lipase: 36 U/L (ref 11.0–59.0)

## 2020-10-08 NOTE — Addendum Note (Signed)
Addended by: Boris Lown B on: 10/08/2020 09:24 AM   Modules accepted: Orders

## 2020-10-09 LAB — PTH, INTACT AND CALCIUM
Calcium: 9.7 mg/dL (ref 8.6–10.4)
PTH: 100 pg/mL — ABNORMAL HIGH (ref 14–64)

## 2020-10-10 ENCOUNTER — Encounter: Payer: Self-pay | Admitting: Internal Medicine

## 2020-10-10 DIAGNOSIS — E559 Vitamin D deficiency, unspecified: Secondary | ICD-10-CM | POA: Insufficient documentation

## 2020-10-10 DIAGNOSIS — E538 Deficiency of other specified B group vitamins: Secondary | ICD-10-CM | POA: Insufficient documentation

## 2020-10-15 ENCOUNTER — Other Ambulatory Visit: Payer: Self-pay | Admitting: Internal Medicine

## 2020-10-15 ENCOUNTER — Ambulatory Visit (INDEPENDENT_AMBULATORY_CARE_PROVIDER_SITE_OTHER): Payer: Medicare Other

## 2020-10-15 ENCOUNTER — Other Ambulatory Visit: Payer: Self-pay

## 2020-10-15 ENCOUNTER — Ambulatory Visit (INDEPENDENT_AMBULATORY_CARE_PROVIDER_SITE_OTHER): Payer: Medicare Other | Admitting: Internal Medicine

## 2020-10-15 ENCOUNTER — Encounter: Payer: Self-pay | Admitting: Internal Medicine

## 2020-10-15 VITALS — BP 138/70 | HR 73 | Temp 98.1°F | Ht 62.0 in | Wt 119.0 lb

## 2020-10-15 DIAGNOSIS — M47816 Spondylosis without myelopathy or radiculopathy, lumbar region: Secondary | ICD-10-CM | POA: Diagnosis not present

## 2020-10-15 DIAGNOSIS — N183 Chronic kidney disease, stage 3 unspecified: Secondary | ICD-10-CM

## 2020-10-15 DIAGNOSIS — E538 Deficiency of other specified B group vitamins: Secondary | ICD-10-CM

## 2020-10-15 DIAGNOSIS — D649 Anemia, unspecified: Secondary | ICD-10-CM | POA: Diagnosis not present

## 2020-10-15 DIAGNOSIS — M160 Bilateral primary osteoarthritis of hip: Secondary | ICD-10-CM | POA: Diagnosis not present

## 2020-10-15 DIAGNOSIS — E559 Vitamin D deficiency, unspecified: Secondary | ICD-10-CM

## 2020-10-15 DIAGNOSIS — R634 Abnormal weight loss: Secondary | ICD-10-CM

## 2020-10-15 DIAGNOSIS — R1032 Left lower quadrant pain: Secondary | ICD-10-CM | POA: Diagnosis not present

## 2020-10-15 DIAGNOSIS — E611 Iron deficiency: Secondary | ICD-10-CM | POA: Diagnosis not present

## 2020-10-15 DIAGNOSIS — M545 Low back pain, unspecified: Secondary | ICD-10-CM | POA: Diagnosis not present

## 2020-10-15 DIAGNOSIS — I517 Cardiomegaly: Secondary | ICD-10-CM | POA: Diagnosis not present

## 2020-10-15 DIAGNOSIS — G8929 Other chronic pain: Secondary | ICD-10-CM

## 2020-10-15 DIAGNOSIS — R739 Hyperglycemia, unspecified: Secondary | ICD-10-CM

## 2020-10-15 DIAGNOSIS — M5137 Other intervertebral disc degeneration, lumbosacral region: Secondary | ICD-10-CM | POA: Diagnosis not present

## 2020-10-15 LAB — HEPATIC FUNCTION PANEL
ALT: 10 U/L (ref 0–35)
AST: 15 U/L (ref 0–37)
Albumin: 4.1 g/dL (ref 3.5–5.2)
Alkaline Phosphatase: 83 U/L (ref 39–117)
Bilirubin, Direct: 0.1 mg/dL (ref 0.0–0.3)
Total Bilirubin: 0.4 mg/dL (ref 0.2–1.2)
Total Protein: 7.3 g/dL (ref 6.0–8.3)

## 2020-10-15 LAB — LIPID PANEL
Cholesterol: 161 mg/dL (ref 0–200)
HDL: 74.3 mg/dL (ref >39.00)
LDL Cholesterol: 72 mg/dL (ref 0–99)
NonHDL: 87.09
Total CHOL/HDL Ratio: 2
Triglycerides: 74 mg/dL (ref 0.0–149.0)
VLDL: 14.8 mg/dL (ref 0.0–40.0)

## 2020-10-15 LAB — CBC WITH DIFFERENTIAL/PLATELET
Basophils Absolute: 0.1 10*3/uL (ref 0.0–0.1)
Basophils Relative: 1.1 % (ref 0.0–3.0)
Eosinophils Absolute: 0.1 10*3/uL (ref 0.0–0.7)
Eosinophils Relative: 1.3 % (ref 0.0–5.0)
HCT: 33.7 % — ABNORMAL LOW (ref 36.0–46.0)
Hemoglobin: 10.5 g/dL — ABNORMAL LOW (ref 12.0–15.0)
Lymphocytes Relative: 16.5 % (ref 12.0–46.0)
Lymphs Abs: 1.8 10*3/uL (ref 0.7–4.0)
MCHC: 31 g/dL (ref 30.0–36.0)
MCV: 72.5 fl — ABNORMAL LOW (ref 78.0–100.0)
Monocytes Absolute: 0.7 10*3/uL (ref 0.1–1.0)
Monocytes Relative: 6 % (ref 3.0–12.0)
Neutro Abs: 8.4 10*3/uL — ABNORMAL HIGH (ref 1.4–7.7)
Neutrophils Relative %: 75.1 % (ref 43.0–77.0)
Platelets: 604 10*3/uL — ABNORMAL HIGH (ref 150.0–400.0)
RBC: 4.64 Mil/uL (ref 3.87–5.11)
RDW: 18.7 % — ABNORMAL HIGH (ref 11.5–15.5)
WBC: 11.1 10*3/uL — ABNORMAL HIGH (ref 4.0–10.5)

## 2020-10-15 LAB — URINALYSIS, ROUTINE W REFLEX MICROSCOPIC
Bilirubin Urine: NEGATIVE
Hgb urine dipstick: NEGATIVE
Ketones, ur: NEGATIVE
Leukocytes,Ua: NEGATIVE
Nitrite: NEGATIVE
RBC / HPF: NONE SEEN (ref 0–?)
Specific Gravity, Urine: 1.015 (ref 1.000–1.030)
Total Protein, Urine: 30 — AB
Urine Glucose: NEGATIVE
Urobilinogen, UA: 0.2 (ref 0.0–1.0)
pH: 6 (ref 5.0–8.0)

## 2020-10-15 LAB — BASIC METABOLIC PANEL
BUN: 12 mg/dL (ref 6–23)
CO2: 27 mEq/L (ref 19–32)
Calcium: 9.4 mg/dL (ref 8.4–10.5)
Chloride: 103 mEq/L (ref 96–112)
Creatinine, Ser: 1.23 mg/dL — ABNORMAL HIGH (ref 0.40–1.20)
GFR: 42.37 mL/min — ABNORMAL LOW (ref >60.00)
Glucose, Bld: 92 mg/dL (ref 70–99)
Potassium: 3.6 mEq/L (ref 3.5–5.1)
Sodium: 140 mEq/L (ref 135–145)

## 2020-10-15 LAB — LIPASE: Lipase: 48 U/L (ref 11.0–59.0)

## 2020-10-15 LAB — VITAMIN D 25 HYDROXY (VIT D DEFICIENCY, FRACTURES): VITD: 18.87 ng/mL — ABNORMAL LOW (ref 30.00–100.00)

## 2020-10-15 LAB — FERRITIN: Ferritin: 75.1 ng/mL (ref 10.0–291.0)

## 2020-10-15 LAB — VITAMIN B12: Vitamin B-12: 277 pg/mL (ref 211–911)

## 2020-10-15 LAB — IBC PANEL
Iron: 42 ug/dL (ref 42–145)
Saturation Ratios: 11.9 % — ABNORMAL LOW (ref 20.0–50.0)
Transferrin: 253 mg/dL (ref 212.0–360.0)

## 2020-10-15 LAB — HEMOGLOBIN A1C: Hgb A1c MFr Bld: 6 % (ref 4.6–6.5)

## 2020-10-15 IMAGING — DX DG CHEST 2V
2 series · 2 of 2 positions shown · non-contrast
Comparison: [DATE]

CLINICAL DATA: Left lower quadrant pain for 3 weeks. Recent weight
loss. Smoker. History of COPD and hypertension.

EXAM:
CHEST - 2 VIEW

[chest pa]
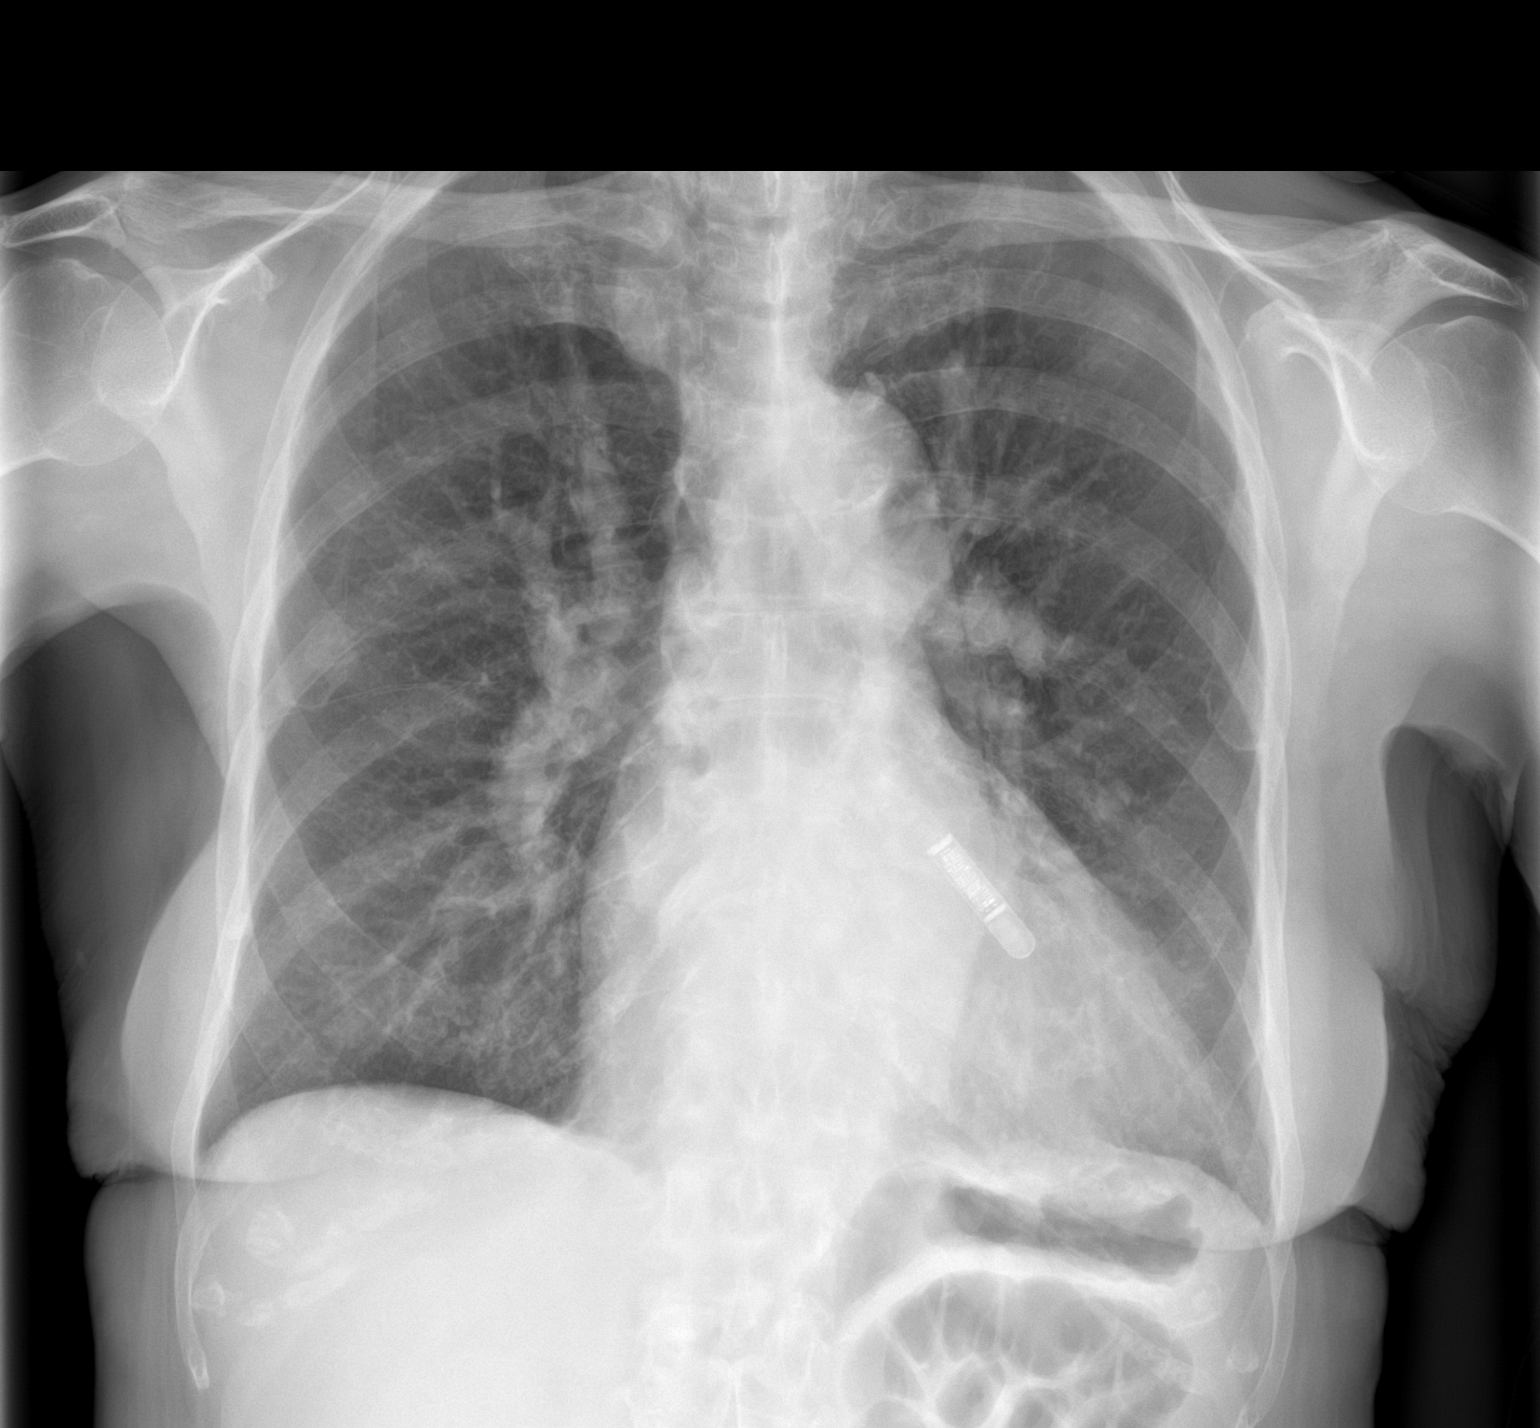

[chest lat]
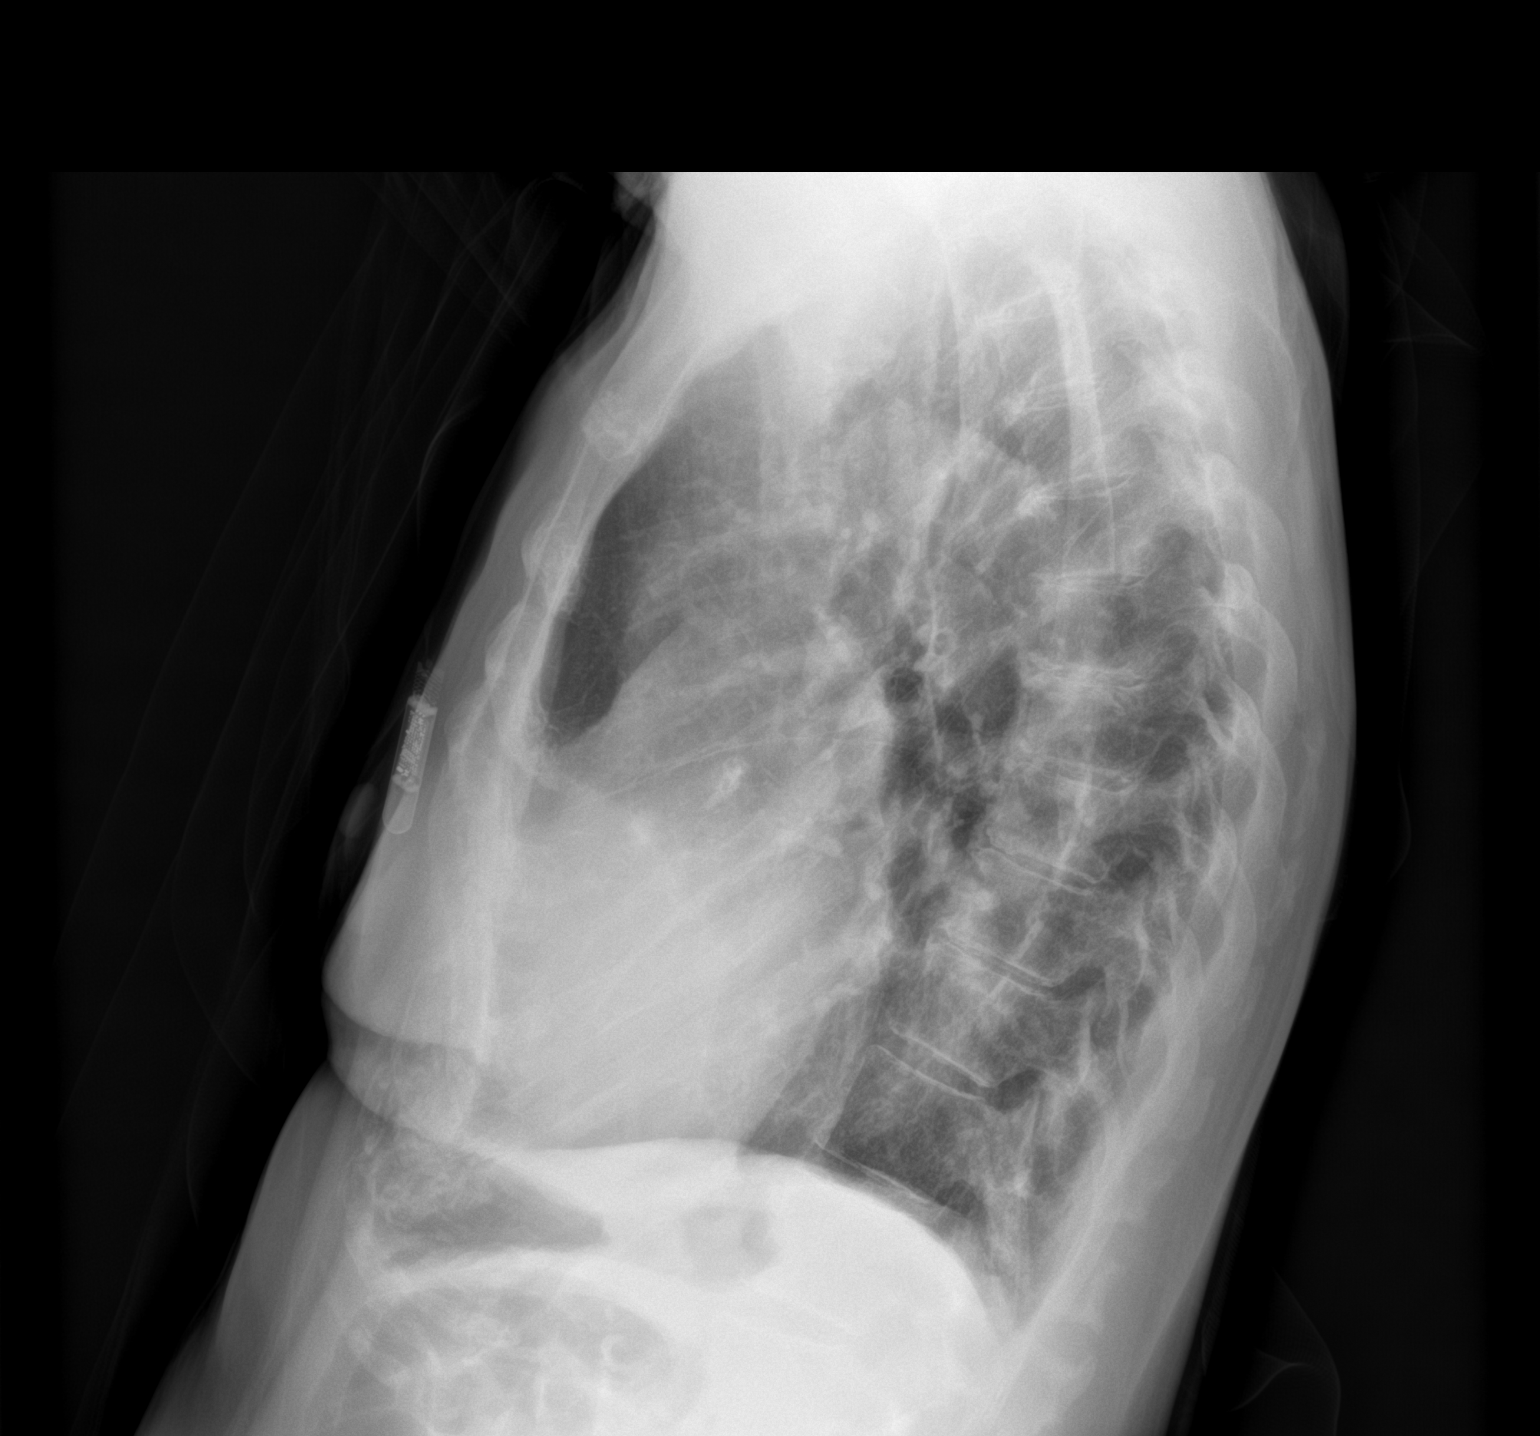

[2 of 2 positions shown; findings below may reference images not displayed]

FINDINGS: Loop recorder is present. Mild cardiac enlargement with mild central
vascular congestion in the lungs. No edema or consolidation. No
pleural effusions. No pneumothorax. Mediastinal contours appear
intact. Calcified aorta.
IMPRESSION: Mild cardiac enlargement and central vascular congestion. No edema
or consolidation.

## 2020-10-15 IMAGING — DX DG HIP (WITH OR WITHOUT PELVIS) 2-3V*L*
3 series · 3 of 3 positions shown · non-contrast
Comparison: None.

CLINICAL DATA: Left hip and groin pain

EXAM:
DG HIP (WITH OR WITHOUT PELVIS) 2-3V LEFT

[pelvis ap]
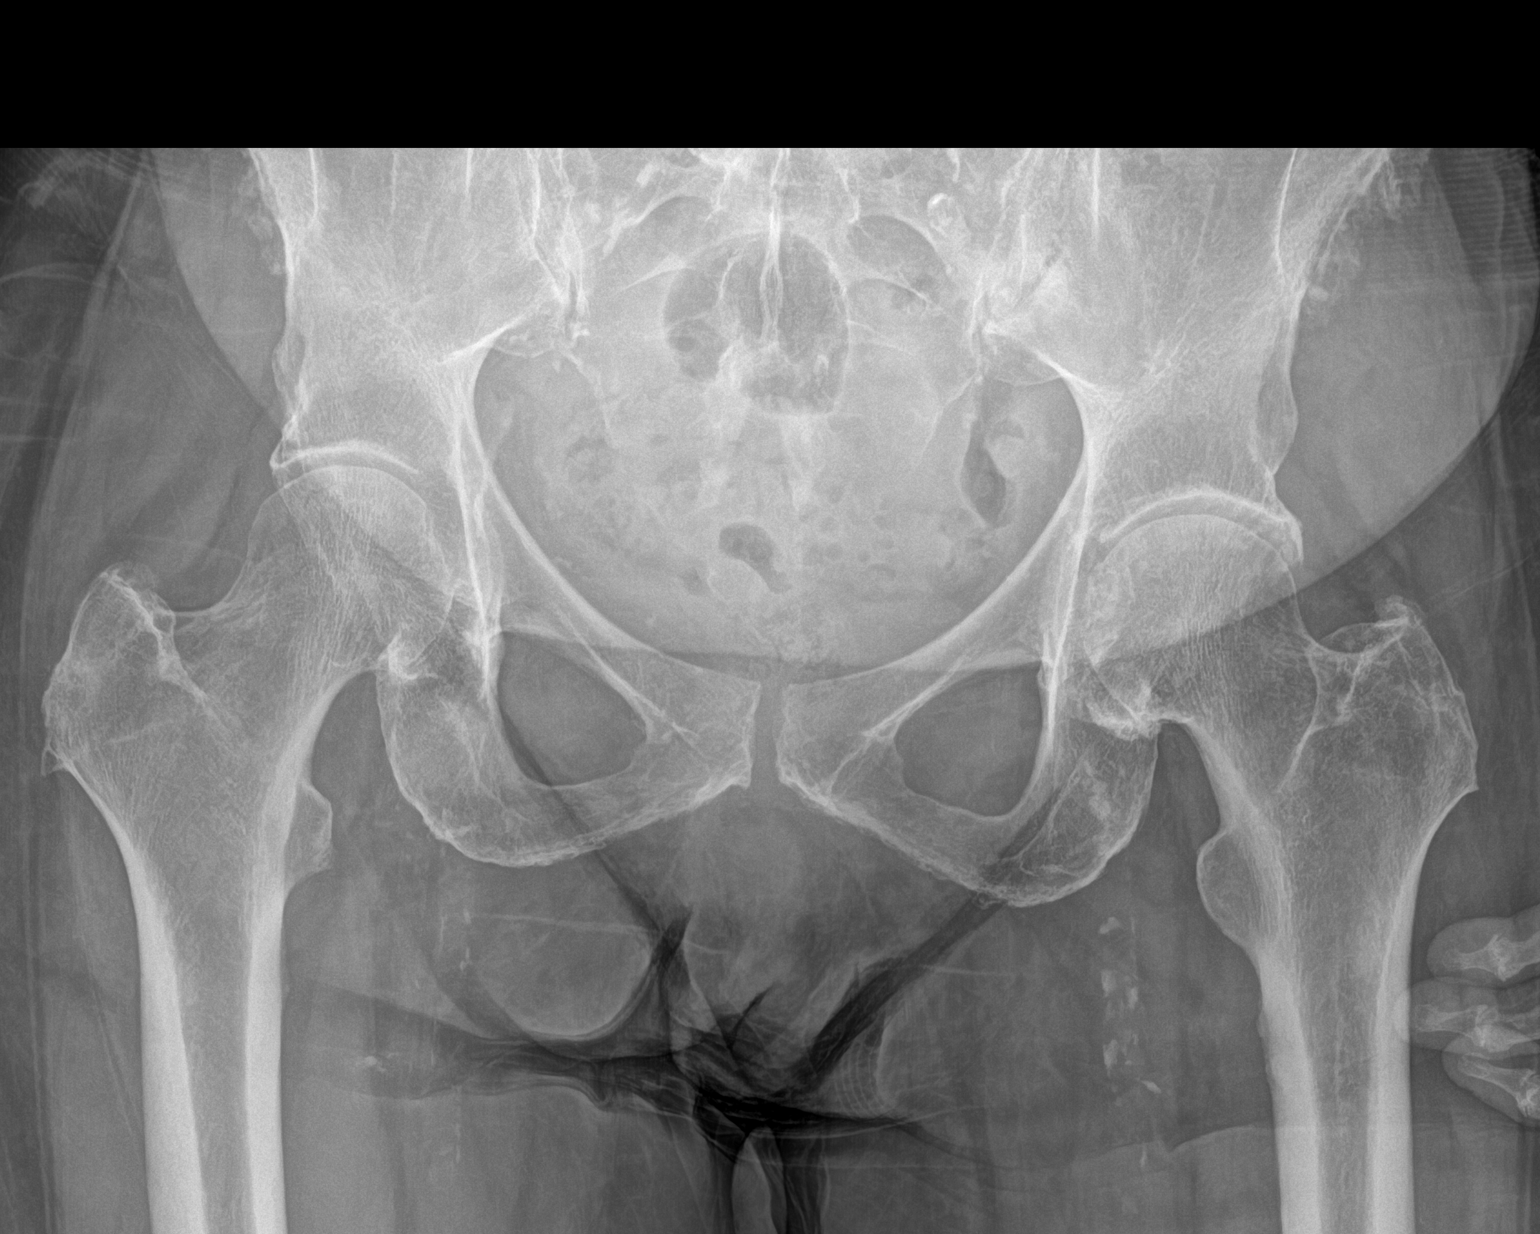

[hip frog leg (1 of 2)]
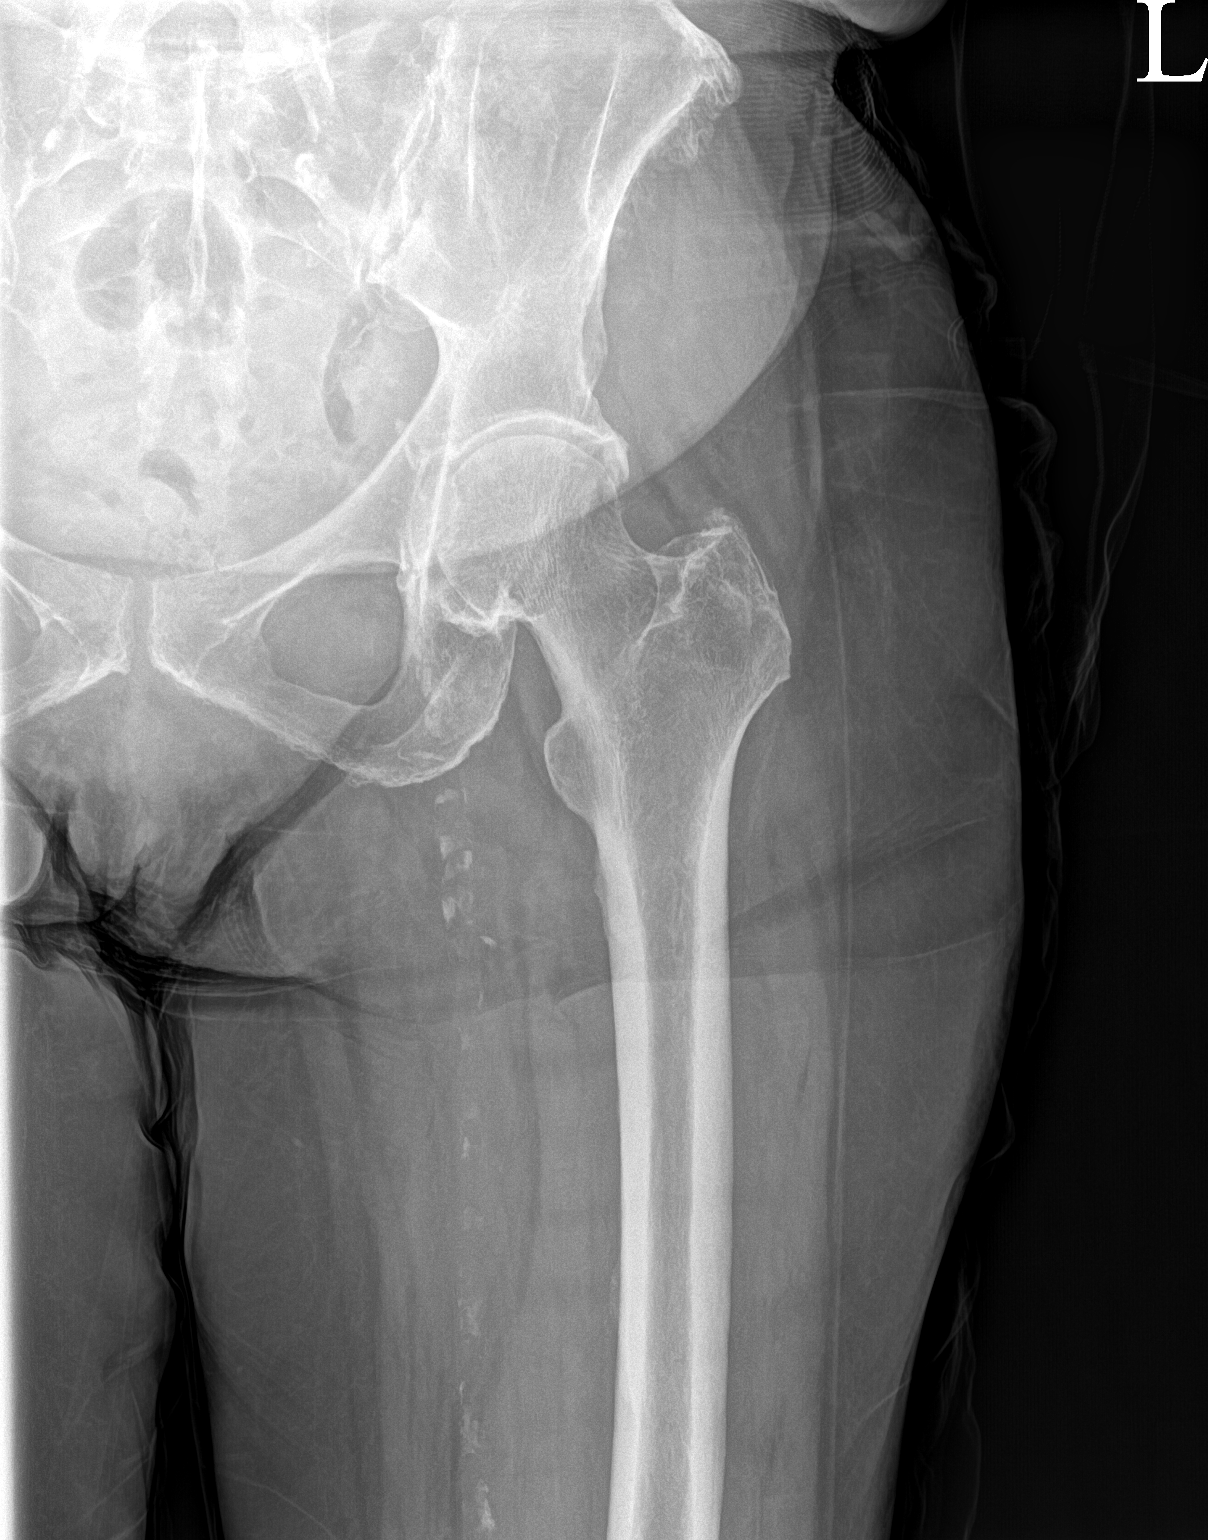

[hip frog leg (2 of 2)]
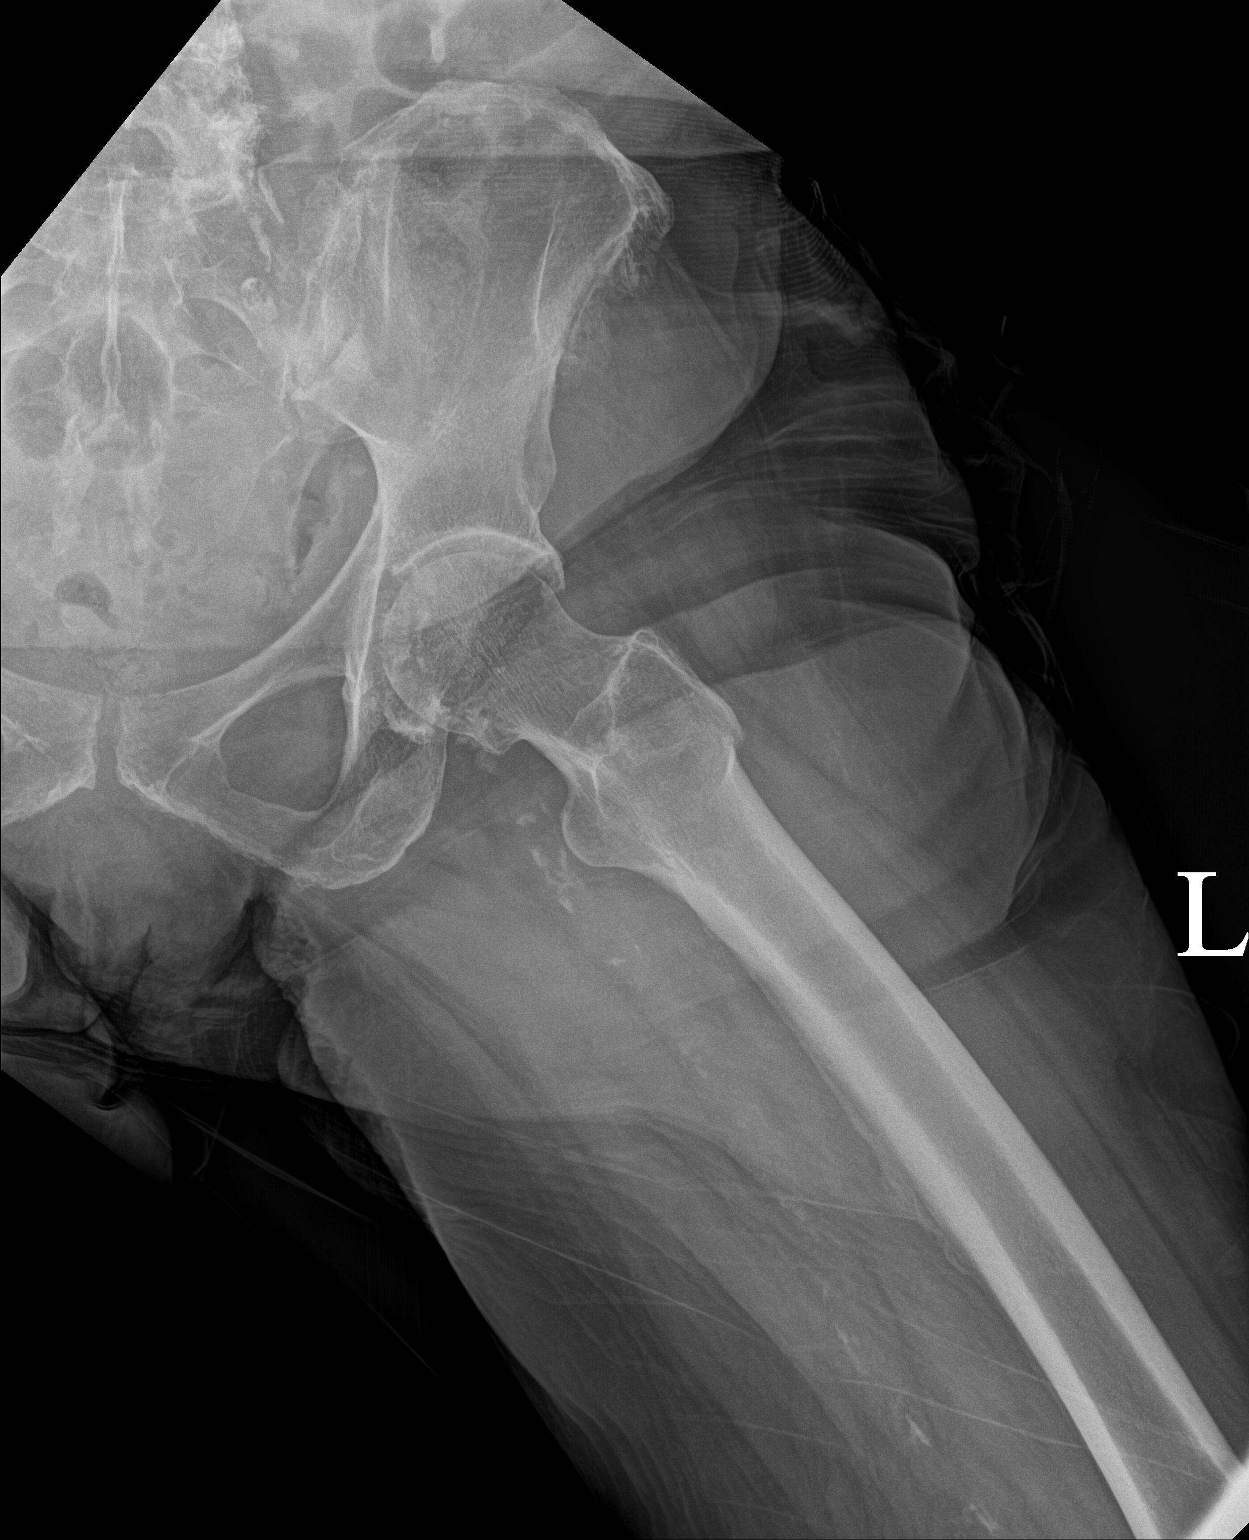

[3 of 3 positions shown; findings below may reference images not displayed]

FINDINGS: Mild degenerative changes in the hips bilaterally, left greater than
right with joint space narrowing and spurring. Mild sclerosis around
the left SI joint inferiorly. Right SI joint unremarkable. No acute
bony abnormality. Specifically, no fracture, subluxation, or
dislocation.
IMPRESSION: Mild degenerative changes in the hips, left greater than right.

Sclerosis around the left SI joint compatible with sacroiliitis.

No acute bony abnormality.

## 2020-10-15 IMAGING — DX DG LUMBAR SPINE COMPLETE 4+V
5 series · 5 of 5 positions shown · non-contrast
Comparison: [DATE]

CLINICAL DATA: Recent lumbar pain

EXAM:
LUMBAR SPINE - COMPLETE 4+ VIEW

[l-spine ap]
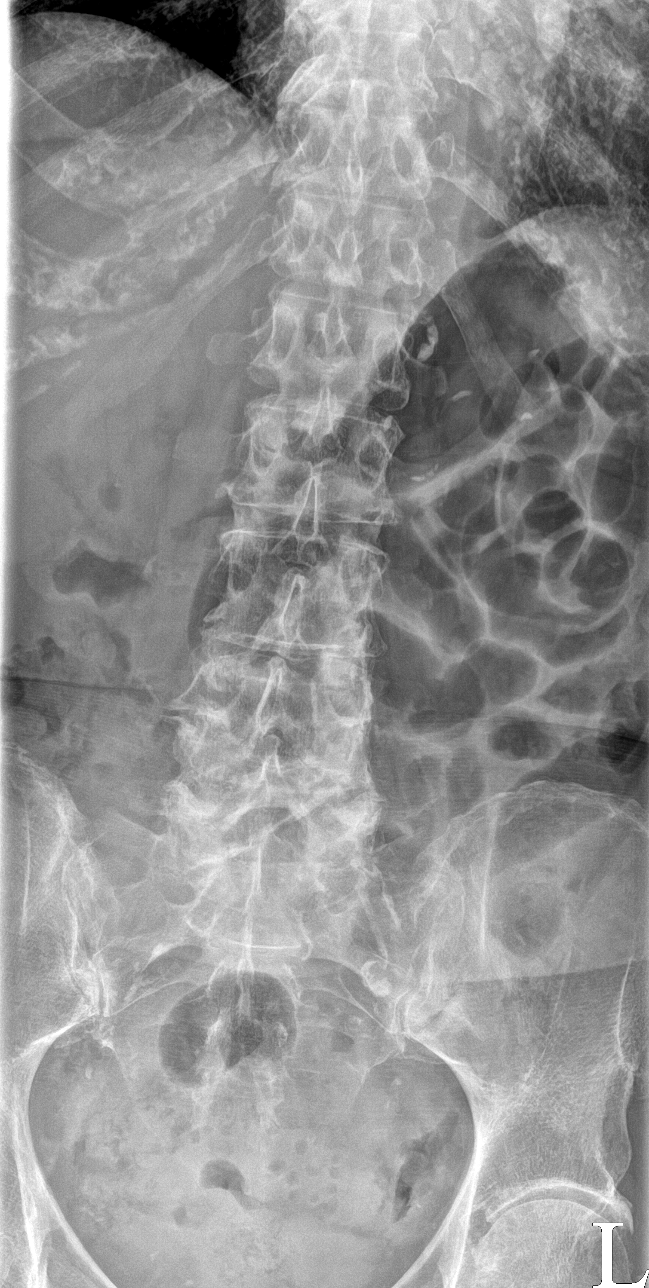

[l-spine obl (1 of 2)]
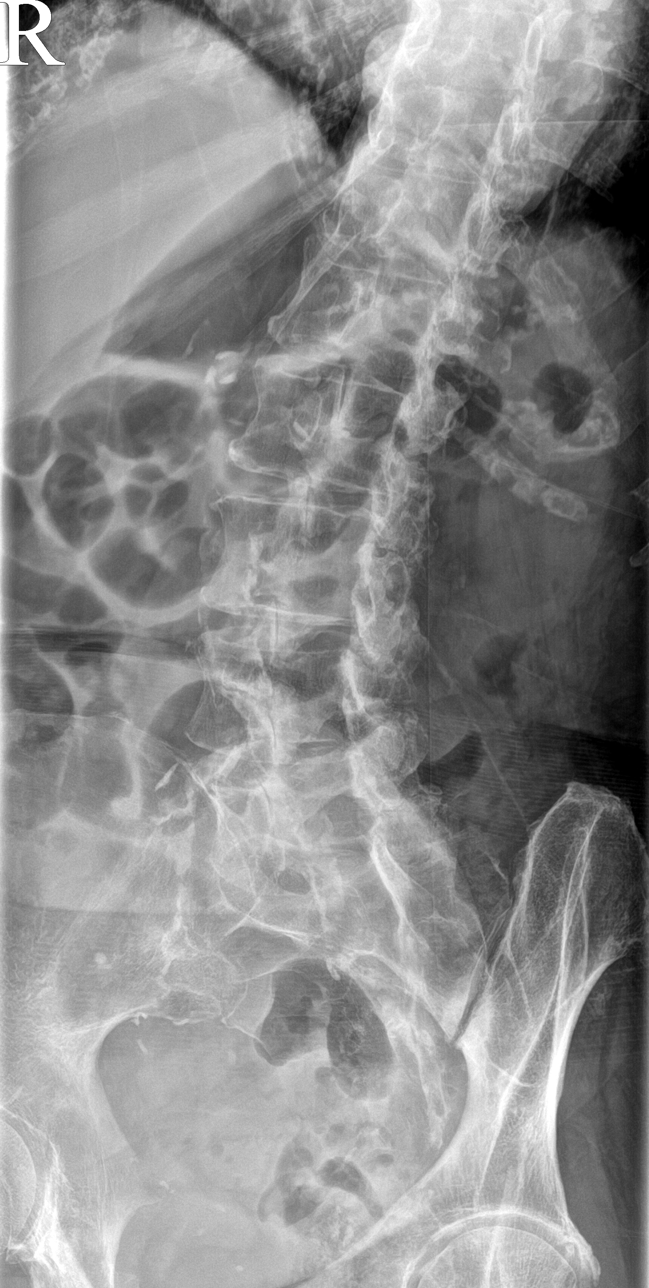

[l-spine obl (2 of 2)]
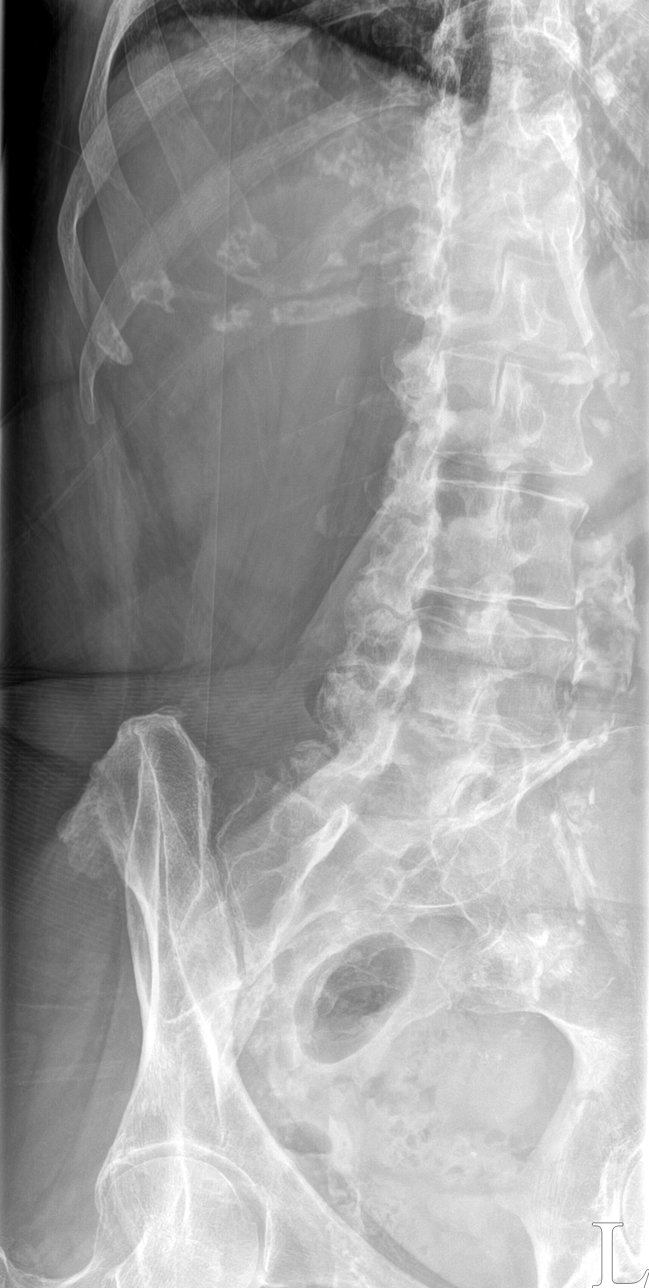

[l-spine lateral]
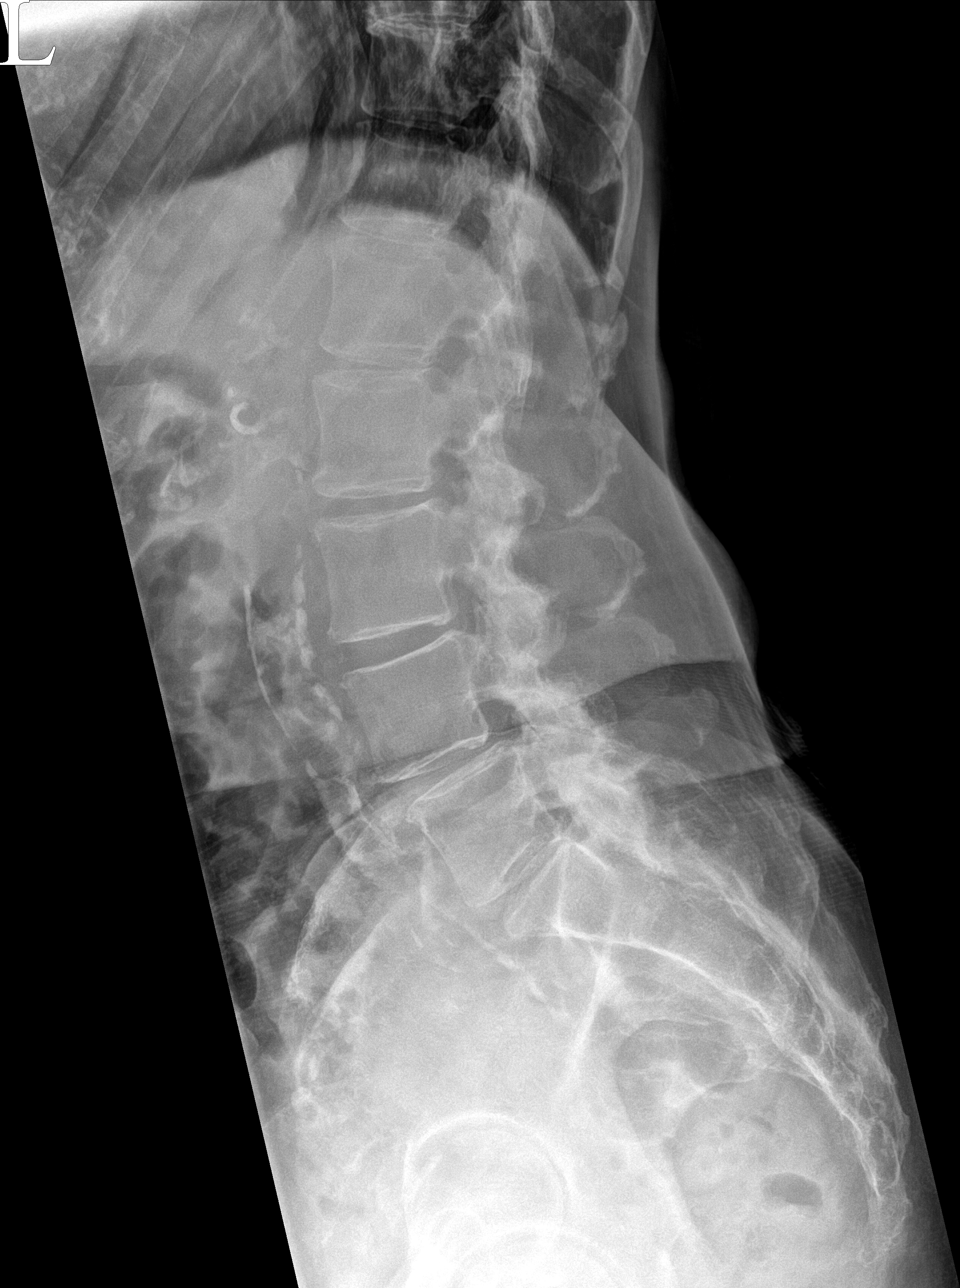

[l-spine spot]
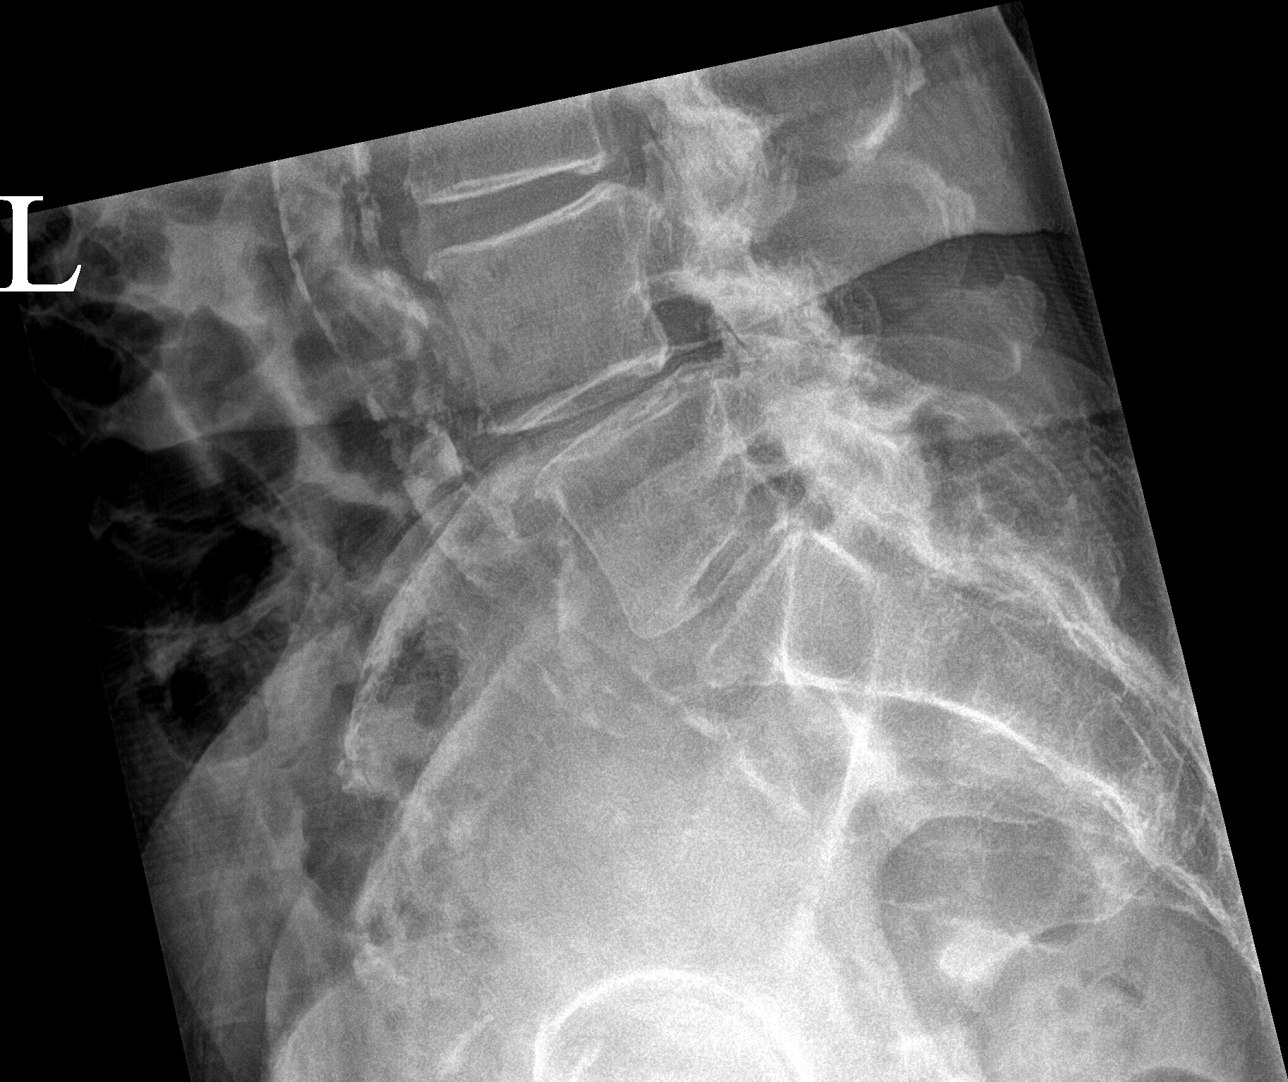

[5 of 5 positions shown; findings below may reference images not displayed]

FINDINGS: Five lumbar type vertebral bodies are well visualized. Vertebral
body height is well maintained. Facet hypertrophic changes are
noted. Mild anterolisthesis of L4 on L5 is noted stable from the
prior exam. Disc space narrowing is noted at L4-5 and L5-S1. Aortic
calcifications are seen.
IMPRESSION: Degenerative change with mild anterolisthesis. No acute abnormality
noted.

## 2020-10-15 NOTE — Progress Notes (Signed)
Subjective:    Patient ID: Elaine Turner, female    DOB: [birth year], 74 y.o.   MRN: [id number]  HPI  Here to f/u; overall doing ok,  Pt denies chest pain, increasing sob or doe, wheezing, orthopnea, PND, increased LE swelling, palpitations, dizziness or syncope.  Pt denies new neurological symptoms such as new headache, or facial or extremity weakness or numbness.  Pt denies polydipsia, polyuria,.  Pt states overall good compliance with meds, mostly trying to follow appropriate diet, with wt overall stable,  but little exercise however. Also, chronic recurring low back pain improved in hte last wl, but Now left groin and hip pain worse to walk, difficult to get here today, pain about 7/10, no recent falls.  Also c/o worsening wt loss, early satiety,? Constipatoin, has GI f/u appt Dr Henrene Pastor nov 7; peak wt has been 179 in past, has lost over 20 lbs in the past yr for unclear reasons.  Denies worsening depressive symptoms, suicidal ideation, or panic  Also worsneing anemia recently for unclear reasons, no overt bleeding.  Taking vit d.   Past Medical History:  Diagnosis Date  . 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 Aortic atherosclerosis ([place])   . Chronic diarrhea   . COPD  GOLD 0 / active smoker  10/24/2015  . Diverticulitis   . Diverticulosis   . Esophageal stricture   . Heart murmur   . Hemorrhoids   . Hyperglycemia 05/01/2013  . Hypertension   . 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 Pneumonia 05/01/2013  . PVD (peripheral vascular disease) ([place])   . Urine incontinence    Past Surgical History:  Procedure Laterality Date  . ABDOMINAL HYSTERECTOMY    . CARPAL TUNNEL RELEASE Right 09/2019  . HEMORRHOID SURGERY N/A 06/26/2015   Procedure: INTERNAL AND EXTERNAL HEMORRHOIDECTOMY;  Surgeon: Georganna Skeans, MD;  Location: [facility];  Service: General;  Laterality: N/A;  . LOOP RECORDER INSERTION N/A 05/25/2019    Procedure: LOOP RECORDER INSERTION;  Surgeon: Constance Haw, MD;  Location: [facility];  Service: Cardiovascular;  Laterality: N/A;    reports that she has been smoking cigarettes. She has a 62.00 pack-year smoking history. She has never used smokeless tobacco. She reports current alcohol use. She reports that she does not use drugs. family history includes Hypertension in her mother; Lung cancer in her father. Allergies  Allergen Reactions  . Ace Inhibitors Cough  . Levaquin [Levofloxacin] Other (See Comments)    GI upset   Current Outpatient Medications on File Prior to Visit  Medication Sig Dispense Refill  . albuterol (VENTOLIN HFA) 108 (90 Base) MCG/ACT inhaler Inhale 2 puffs into the lungs every 6 (six) hours as needed for wheezing or shortness of breath. 8 g 2  . aspirin 81 MG EC tablet Take 1 tablet (81 mg total) by mouth daily. Swallow whole. 30 tablet 12  . atorvastatin (LIPITOR) 40 MG tablet TAKE 1 TABLET (40 MG TOTAL) BY MOUTH DAILY AT 6 PM. 90 tablet 3  . benzonatate (TESSALON) 100 MG capsule Take 1 capsule (100 mg total) by mouth every 8 (eight) hours. 21 capsule 0  . clopidogrel (PLAVIX) 75 MG tablet Take 1 tablet (75 mg total) by mouth daily. 90 tablet 3  . feeding supplement, ENSURE ENLIVE, (ENSURE ENLIVE) LIQD Take 237 mLs by mouth daily at 3 pm. 237 mL 12  . lactulose (CHRONULAC) 10 GM/15ML solution Take  30 mLs (20 g total) by mouth 2 (two) times daily as needed for mild constipation or moderate constipation. 236 mL 0  . losartan (COZAAR) 50 MG tablet Take 1 tablet (50 mg total) by mouth daily. 90 tablet 3  . NIFEdipine (PROCARDIA XL/NIFEDICAL-XL) 90 MG 24 hr tablet TAKE 1 TABLET BY MOUTH EVERY DAY 90 tablet 2  . senna (SENOKOT) 8.6 MG tablet Take 1 tablet (8.6 mg total) by mouth daily. 90 tablet 3  . tiZANidine (ZANAFLEX) 2 MG tablet TAKE 1 TABLET (2 MG TOTAL) BY MOUTH EVERY 6 (SIX) HOURS AS NEEDED FOR MUSCLE SPASMS. 30 tablet 2  . zolpidem (AMBIEN) 5 MG  tablet TAKE 1 TABLET (5 MG TOTAL) BY MOUTH AT BEDTIME AS NEEDED FOR SLEEP. 90 tablet 1  . [DISCONTINUED] citalopram (CELEXA) 10 MG tablet Take 1 tablet (10 mg total) by mouth daily. 90 tablet 3  . [DISCONTINUED] fluticasone (FLONASE) 50 MCG/ACT nasal spray Place 2 sprays into both nostrils daily. (Patient taking differently: Place 2 sprays into both nostrils as needed for rhinitis. ) 16 g 6   No current facility-administered medications on file prior to visit.   Review of Systems All otherwise neg per pt    Objective:   Physical Exam BP 138/70 (BP Location: Right Arm, Patient Position: Sitting, Cuff Size: Large)   Pulse 73   Temp 98.1 F (36.7 C) (Oral)   Ht 5\' 2"  (1.575 m)   Wt 119 lb (54 kg)   SpO2 98%   BMI 21.77 kg/m  VS noted, more thin it seems Constitutional: Pt appears in NAD HENT: Head: NCAT.  Right Ear: External ear normal.  Left Ear: External ear normal.  Eyes: . Pupils are equal, round, and reactive to light. Conjunctivae and EOM are normal Nose: without d/c or deformity Neck: Neck supple. Gross normal ROM Cardiovascular: Normal rate and regular rhythm.   Pulmonary/Chest: Effort normal and breath sounds without rales or wheezing.  Abd:  Soft, NT, ND, + BS, no organomegaly Spine nontender, though has some tender over left SI joint, but not over left greater trochanter Neurological: Pt is alert. At baseline orientation, motor grossly intact Skin: Skin is warm. No rashes, other new lesions, no LE edema Psychiatric: Pt behavior is normal without agitation  All otherwise neg per pt Lab Results  Component Value Date   WBC 11.1 (H) 10/15/2020   HGB 10.5 (L) 10/15/2020   HCT 33.7 (L) 10/15/2020   PLT 604.0 (H) 10/15/2020   GLUCOSE 92 10/15/2020   CHOL 161 10/15/2020   TRIG 74.0 10/15/2020   HDL 74.30 10/15/2020   LDLCALC 72 10/15/2020   ALT 10 10/15/2020   AST 15 10/15/2020   NA 140 10/15/2020   K 3.6 10/15/2020   CL 103 10/15/2020   CREATININE 1.23 (H)  10/15/2020   BUN 12 10/15/2020   CO2 27 10/15/2020   TSH 0.60 01/24/2020   INR 1.1 05/24/2019   HGBA1C 6.0 10/15/2020      Assessment & Plan:

## 2020-10-15 NOTE — Assessment & Plan Note (Addendum)
Improved,  But tender over left SI joint - for films  I spent 41 minutes in preparing to see the patient by review of recent labs, imaging and procedures, obtaining and reviewing separately obtained history, communicating with the patient and family or caregiver, ordering medications, tests or procedures, and documenting clinical information in the EHR including the differential Dx, treatment, and any further evaluation and other management of low back pain, llq pain, left groin pain, vit d def,  Wt loss with early satiety, hyperglycemia, ckd, b12 def, anemia

## 2020-10-15 NOTE — Patient Instructions (Signed)
Please continue all other medications as before, and refills have been done if requested.  Please have the pharmacy call with any other refills you may need.  Please continue your efforts at being more active, low cholesterol diet,  Please keep your appointments with your specialists as you may have planned  Please go to the XRAY Department in the first floor for the x-ray testing - low back , chest and left hip  Please go to the LAB at the blood drawing area for the tests to be done  You will be contacted regarding the referral for: CT scan for the abdomen/pelvis  You will be contacted by phone if any changes need to be made immediately.  Otherwise, you will receive a letter about your results with an explanation, but please check with MyChart first.  Please remember to sign up for MyChart if you have not done so, as this will be important to you in the future with finding out test results, communicating by private email, and scheduling acute appointments online when needed.  Please make an Appointment to return in 3 weeks

## 2020-10-17 ENCOUNTER — Encounter: Payer: Self-pay | Admitting: Internal Medicine

## 2020-10-17 ENCOUNTER — Other Ambulatory Visit: Payer: Self-pay | Admitting: Internal Medicine

## 2020-10-17 DIAGNOSIS — M47818 Spondylosis without myelopathy or radiculopathy, sacral and sacrococcygeal region: Secondary | ICD-10-CM

## 2020-10-20 ENCOUNTER — Encounter: Payer: Self-pay | Admitting: Internal Medicine

## 2020-10-20 NOTE — Assessment & Plan Note (Signed)
For left hip film, r/o djd

## 2020-10-20 NOTE — Assessment & Plan Note (Signed)
For plain film - ? djd

## 2020-10-20 NOTE — Assessment & Plan Note (Addendum)
Etiology unclear, has f/u with GI soon, for labs today, consider ct chest/abd/pelvis if not revealing

## 2020-10-20 NOTE — Assessment & Plan Note (Signed)
stable overall by history and exam, recent data reviewed with pt, and pt to continue medical treatment as before,  to f/u any worsening symptoms or concerns  

## 2020-10-20 NOTE — Assessment & Plan Note (Signed)
Cont oral replacement 

## 2020-10-20 NOTE — Assessment & Plan Note (Signed)
No overt bleeding, for f/u lab

## 2020-10-20 NOTE — Assessment & Plan Note (Signed)
stable overall by history and exam, recent data reviewed with pt, and pt to continue medical treatment as before,  to f/u any worsening symptoms or concerns, for f/u lab 

## 2020-10-23 ENCOUNTER — Ambulatory Visit (INDEPENDENT_AMBULATORY_CARE_PROVIDER_SITE_OTHER): Payer: Medicare Other | Admitting: Family Medicine

## 2020-10-23 DIAGNOSIS — Z5329 Procedure and treatment not carried out because of patient's decision for other reasons: Secondary | ICD-10-CM

## 2020-10-23 NOTE — Progress Notes (Signed)
NO show

## 2020-10-26 ENCOUNTER — Other Ambulatory Visit: Payer: Self-pay | Admitting: Internal Medicine

## 2020-10-30 ENCOUNTER — Other Ambulatory Visit: Payer: Medicare Other

## 2020-10-31 ENCOUNTER — Other Ambulatory Visit: Payer: Self-pay | Admitting: Internal Medicine

## 2020-11-01 ENCOUNTER — Ambulatory Visit: Payer: Medicare Other

## 2020-11-01 ENCOUNTER — Other Ambulatory Visit: Payer: Self-pay

## 2020-11-03 LAB — CUP PACEART REMOTE DEVICE CHECK
Date Time Interrogation Session: 20211107005105
Implantable Pulse Generator Implant Date: 20200528

## 2020-11-04 ENCOUNTER — Encounter: Payer: Self-pay | Admitting: Internal Medicine

## 2020-11-04 ENCOUNTER — Ambulatory Visit (INDEPENDENT_AMBULATORY_CARE_PROVIDER_SITE_OTHER): Payer: Medicare Other

## 2020-11-04 ENCOUNTER — Ambulatory Visit: Payer: Medicare Other | Admitting: Internal Medicine

## 2020-11-04 VITALS — BP 148/80 | HR 85 | Ht 62.0 in | Wt 117.0 lb

## 2020-11-04 DIAGNOSIS — D649 Anemia, unspecified: Secondary | ICD-10-CM | POA: Diagnosis not present

## 2020-11-04 DIAGNOSIS — R634 Abnormal weight loss: Secondary | ICD-10-CM

## 2020-11-04 DIAGNOSIS — R109 Unspecified abdominal pain: Secondary | ICD-10-CM | POA: Diagnosis not present

## 2020-11-04 DIAGNOSIS — D500 Iron deficiency anemia secondary to blood loss (chronic): Secondary | ICD-10-CM | POA: Diagnosis not present

## 2020-11-04 DIAGNOSIS — K5900 Constipation, unspecified: Secondary | ICD-10-CM

## 2020-11-04 DIAGNOSIS — I639 Cerebral infarction, unspecified: Secondary | ICD-10-CM

## 2020-11-04 MED ORDER — PLENVU 140 G PO SOLR: 1.0000 | ORAL | 0 refills | Status: DC

## 2020-11-04 NOTE — Patient Instructions (Signed)
You have been scheduled for an endoscopy and colonoscopy. Please follow the written instructions given to you at your visit today. Please pick up your prep supplies at the pharmacy within the next 1-3 days. If you use inhalers (even only as needed), please bring them with you on the day of your procedure.  Please stay ON you Plavix for your upcoming endoscopy/colonoscopy procedure.  If you are age 76 or older, your body mass index should be between 23-30. Your Body mass index is 21.4 kg/m. If this is out of the aforementioned range listed, please consider follow up with your Primary Care Provider.  Due to recent changes in healthcare laws, you may see the results of your imaging and laboratory studies on MyChart before your provider has had a chance to review them.  We understand that in some cases there may be results that are confusing or concerning to you. Not all laboratory results come back in the same time frame and the provider may be waiting for multiple results in order to interpret others.  Please give [place] 48 hours in order for your provider to thoroughly review all the results before contacting the office for clarification of your results.

## 2020-11-04 NOTE — Progress Notes (Signed)
HISTORY OF PRESENT ILLNESS:  Elaine Turner is a 74 y.o. 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  She also has a history of diverticulitis.  Last colonoscopy 2014 with severe diverticulosis.  Otherwise normal.  She presents today with chief complaint of chronic constipation, decreased appetite associated with progressive weight loss, and mid abdominal pain.  She has had issues with constipation for some time.  Seen via telehealth medicine April 2020.  She has tried a number of agents including MiraLAX, Chronulac, Senokot, and Dulcolax.  She has variable success with these agents.  She states she does the best with 6 Dulcolax at night.  She was seen by Dr. Jenny Reichmann on October 15, 2020.  I have reviewed that visit.  Blood work including comprehensive metabolic panel and CBC were remarkable for hemoglobin 10.5.  Iron saturation 11.9%.  Plain films were unrevealing.  She does have a CT scan of the abdomen and pelvis scheduled for November 16.  She denies melena or hematochezia.  She has completed her Covid vaccination series  REVIEW OF SYSTEMS:  All non-GI ROS negative unless otherwise stated in the HPI except for arthritis, back pain, fatigue  Past Medical History:  Diagnosis Date  . 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 Aortic atherosclerosis ([place])   . Chronic diarrhea   . COPD  GOLD 0 / active smoker  10/24/2015  . Diverticulitis   . Diverticulosis   . Esophageal stricture   . Heart murmur   . Hemorrhoids   . Hyperglycemia 05/01/2013  . Hypertension   . 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 Pneumonia 05/01/2013  . PVD (peripheral vascular disease) ([place])   . Urine incontinence     Past Surgical History:  Procedure Laterality Date  . ABDOMINAL HYSTERECTOMY    . CARPAL TUNNEL RELEASE Right  09/2019  . HEMORRHOID SURGERY N/A 06/26/2015   Procedure: INTERNAL AND EXTERNAL HEMORRHOIDECTOMY;  Surgeon: Georganna Skeans, MD;  Location: [facility];  Service: General;  Laterality: N/A;  . LOOP RECORDER INSERTION N/A 05/25/2019   Procedure: LOOP RECORDER INSERTION;  Surgeon: Constance Haw, MD;  Location: [facility];  Service: Cardiovascular;  Laterality: N/A;    Social History [place]  reports that she has been smoking cigarettes. She has a 62.00 pack-year smoking history. She has never used smokeless tobacco. She reports current alcohol use. She reports that she does not use drugs.  family history includes Hypertension in her mother; Lung cancer in her father.  Allergies  Allergen Reactions  . Ace Inhibitors Cough  . Levaquin [Levofloxacin] Other (See Comments)    GI upset       PHYSICAL EXAMINATION: Vital signs: BP (!) 148/80   Pulse 85   Ht 5\' 2"  (1.575 m)   Wt 117 lb (53.1 kg)   SpO2 97%   BMI 21.40 kg/m   Constitutional: Thin, but generally well-appearing, no acute distress Psychiatric: alert and oriented x3, cooperative Eyes: extraocular movements intact, anicteric, conjunctiva pink Mouth: oral pharynx moist, no lesions Neck: supple no lymphadenopathy Cardiovascular: heart regular rate and rhythm, no murmur Lungs: clear to auscultation bilaterally Abdomen: soft, nontender, mild tenderness just below the umbilicus slightly to the left, no obvious ascites, no peritoneal signs, normal bowel sounds, no organomegaly Rectal: Deferred until colonoscopy Extremities: no clubbing, cyanosis, or  lower extremity edema bilaterally Skin: no lesions on visible extremities Neuro: No focal deficits.  Cranial nerves intact  ASSESSMENT:  1.  Persistent constipation with abdominal pain and weight loss.  Rule out intracolonic lesion. 2.  Last colonoscopy 2014 with marked diverticulosis 3.  History of diverticulitis February 2019 4.  Multiple  general medical problems 5.  History of intraocular vascular event.  On Plavix. 6.  Microcytic anemia consistent with iron deficiency  PLAN:  1.  Schedule colonoscopy to evaluate persistent constipation, abdominal pain, microcytic anemia and weight loss.  Patient is HIGH RISK given her comorbidities and the need for Plavix therapy.The nature of the procedure, as well as the risks, benefits, and alternatives were carefully and thoroughly reviewed with the patient. Ample time for discussion and questions allowed. The patient understood, was satisfied, and agreed to proceed. 2.  Schedule upper endoscopy to evaluate abdominal pain, weight loss, and microcytic anemia.  Patient is high risk as above.The nature of the procedure, as well as the risks, benefits, and alternatives were carefully and thoroughly reviewed with the patient. Ample time for discussion and questions allowed. The patient understood, was satisfied, and agreed to proceed. 3.  Keep plans for CT scan as scheduled 4.  Additional recommendations after all of the above completed and results known. Total time of 45 minutes was spent preparing to see the patient, reviewing laboratories, x-rays, endoscopy reports, and outside office evaluations.  Obtaining comprehensive history.  Performing comprehensive physical examination.  Counseling the patient on her above listed issues.  Ordering advanced procedures (high risk in this patient).  Finally, documenting clinical information in the health record

## 2020-11-04 NOTE — Progress Notes (Signed)
Carelink Summary Report / Loop Recorder 

## 2020-11-05 ENCOUNTER — Ambulatory Visit: Payer: Medicare Other | Admitting: Internal Medicine

## 2020-11-07 ENCOUNTER — Ambulatory Visit: Payer: Medicare Other

## 2020-11-11 ENCOUNTER — Ambulatory Visit: Payer: Medicare Other | Admitting: Internal Medicine

## 2020-11-12 ENCOUNTER — Encounter: Payer: Self-pay | Admitting: Internal Medicine

## 2020-11-12 ENCOUNTER — Ambulatory Visit
Admission: RE | Admit: 2020-11-12 | Discharge: 2020-11-12 | Disposition: A | Discharge status: Home / Self Care | Payer: Medicare Other | Source: Ambulatory Visit | Attending: Internal Medicine | Admitting: Internal Medicine

## 2020-11-12 DIAGNOSIS — I700 Atherosclerosis of aorta: Secondary | ICD-10-CM | POA: Insufficient documentation

## 2020-11-12 DIAGNOSIS — N281 Cyst of kidney, acquired: Secondary | ICD-10-CM | POA: Diagnosis not present

## 2020-11-12 DIAGNOSIS — G8929 Other chronic pain: Secondary | ICD-10-CM

## 2020-11-12 DIAGNOSIS — M545 Low back pain, unspecified: Secondary | ICD-10-CM

## 2020-11-12 DIAGNOSIS — R634 Abnormal weight loss: Secondary | ICD-10-CM

## 2020-11-12 DIAGNOSIS — N261 Atrophy of kidney (terminal): Secondary | ICD-10-CM | POA: Diagnosis not present

## 2020-11-12 DIAGNOSIS — K5730 Diverticulosis of large intestine without perforation or abscess without bleeding: Secondary | ICD-10-CM | POA: Diagnosis not present

## 2020-11-12 DIAGNOSIS — E611 Iron deficiency: Secondary | ICD-10-CM

## 2020-11-12 DIAGNOSIS — R1032 Left lower quadrant pain: Secondary | ICD-10-CM

## 2020-11-12 IMAGING — CT CT ABD-PELV W/ CM
1 of 3 series · 13 of 32 positions shown, 19 images · IV contrast (iopamidol)
Comparison: [DATE]

CLINICAL DATA: Left lower quadrant pain for several months. 61 lb
weight loss in past 1 year.

EXAM:
CT ABDOMEN AND PELVIS WITH CONTRAST
TECHNIQUE: Multidetector CT imaging of the abdomen and pelvis was performed
using the standard protocol following bolus administration of
intravenous contrast.
CONTRAST:  100mL [0H] IOPAMIDOL ([0H]) INJECTION 61%

[Series 2: abd/pelvis w/cm · axial · 0.69mm/px · z∈[-393,-53]mm · 13 of 80 slices shown, 19 images]
[im 6/80  soft-tissue]
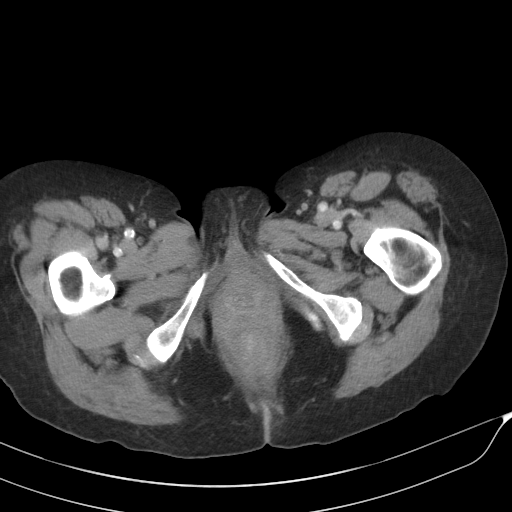
[im 6/80  bone]
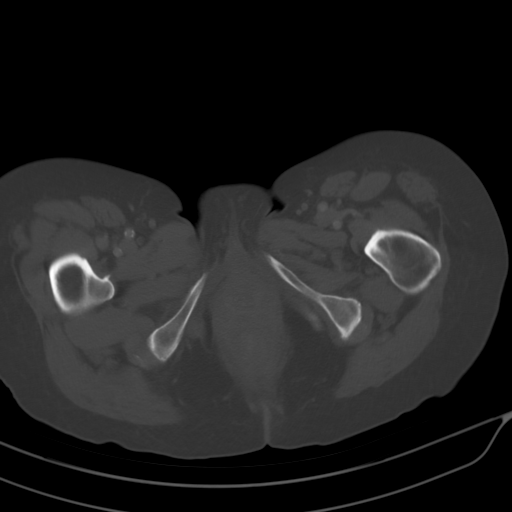
[im 11/80  soft-tissue]
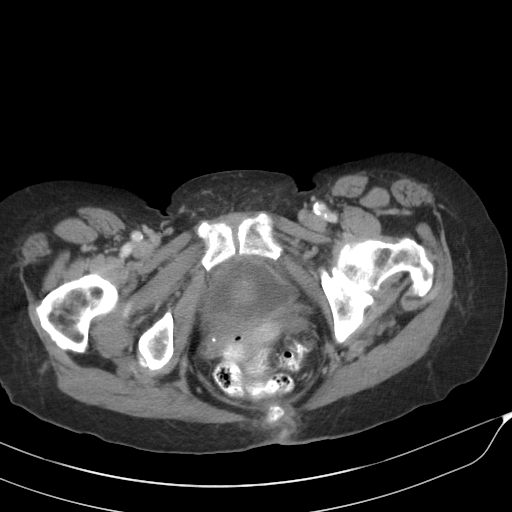
[im 16/80  soft-tissue]
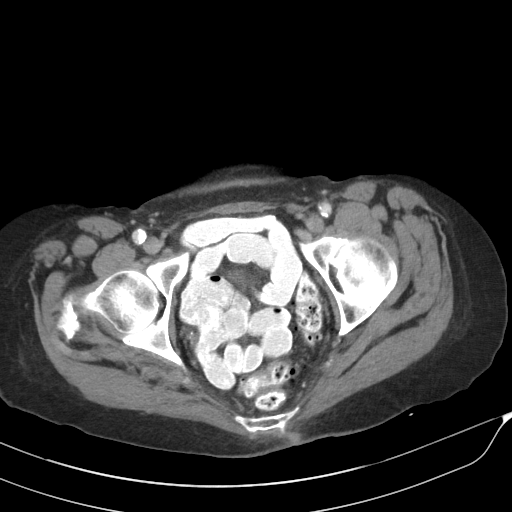
[im 22/80  soft-tissue]
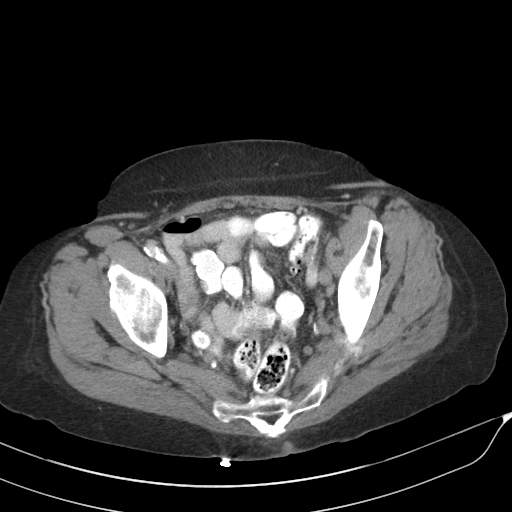
[im 27/80  soft-tissue]
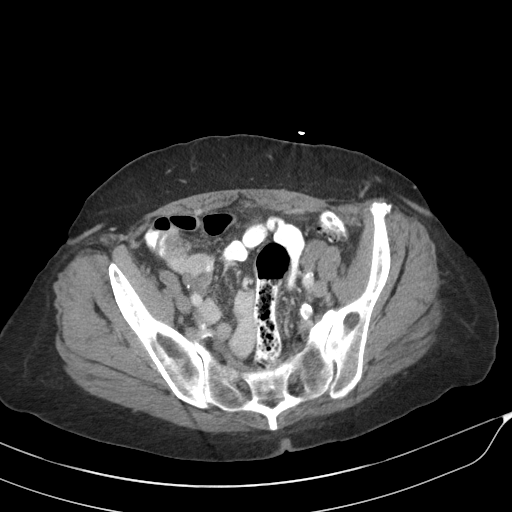
[im 32/80  soft-tissue]
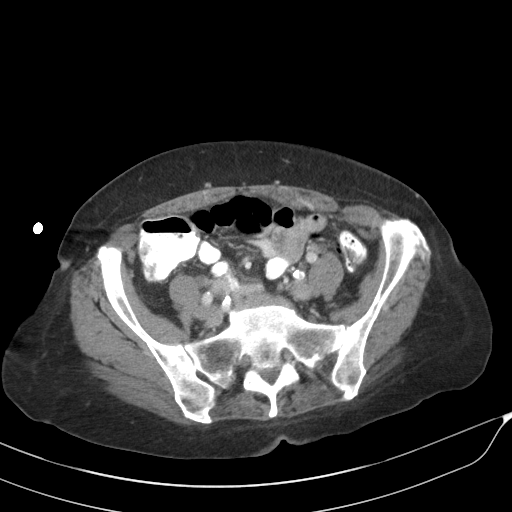
[im 43/80  soft-tissue]
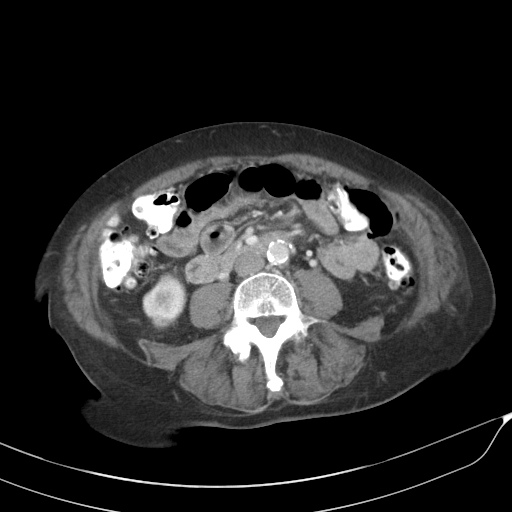
[im 48/80  soft-tissue]
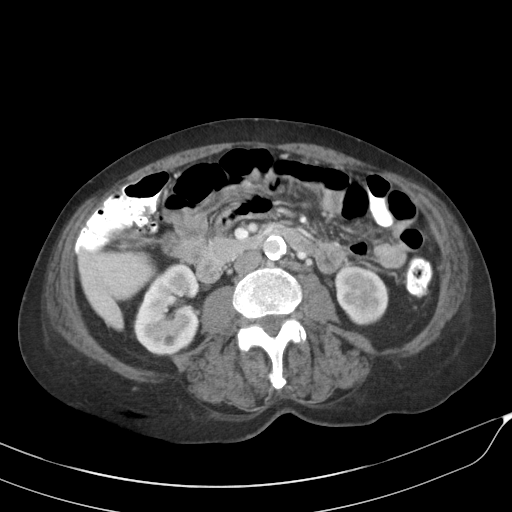
[im 53/80  soft-tissue]
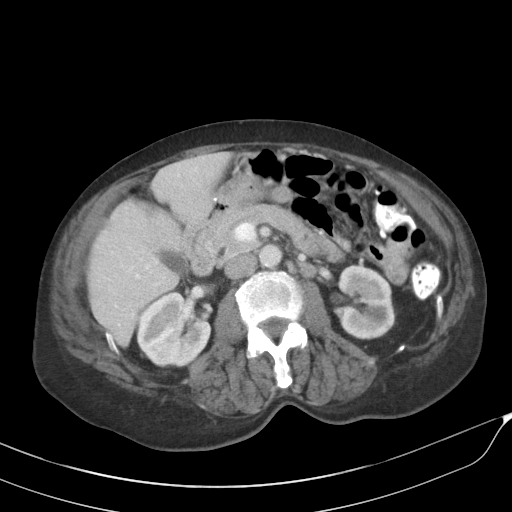
[im 53/80  bone]
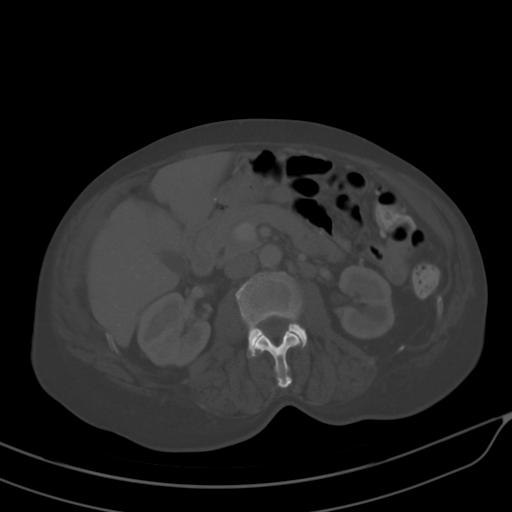
[im 58/80  soft-tissue]
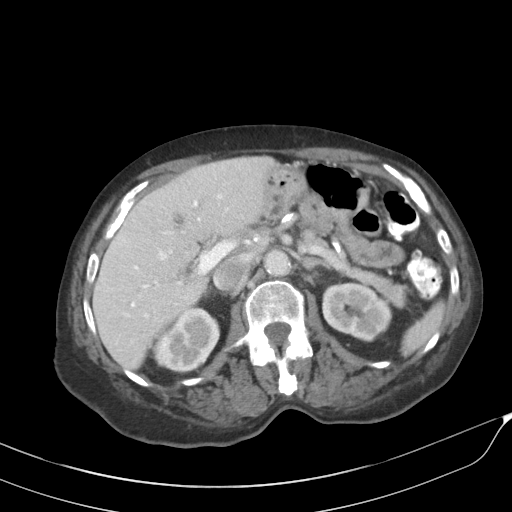
[im 58/80  lung]
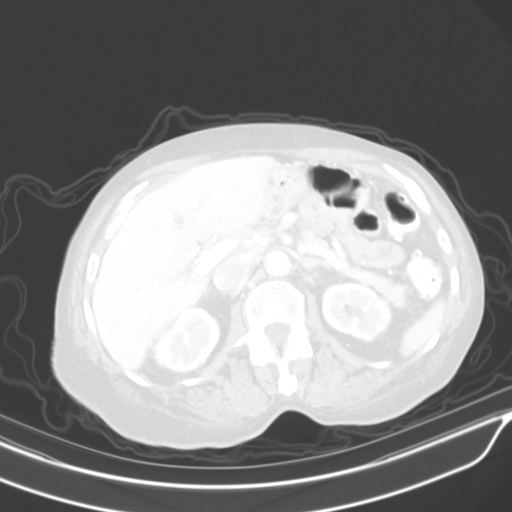
[im 64/80  soft-tissue]
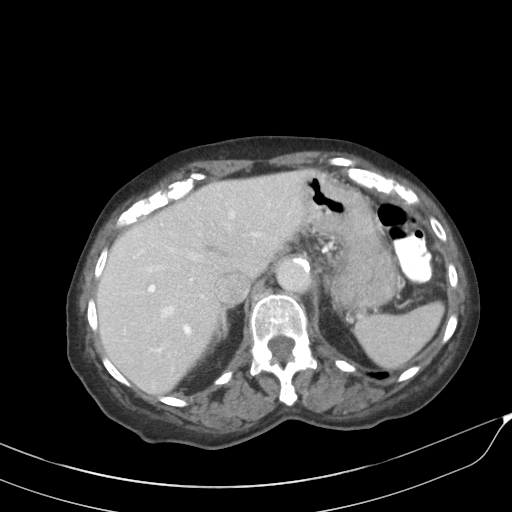
[im 64/80  lung]
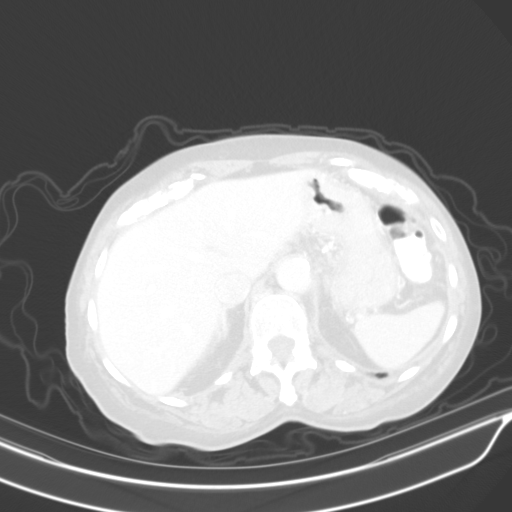
[im 69/80  soft-tissue]
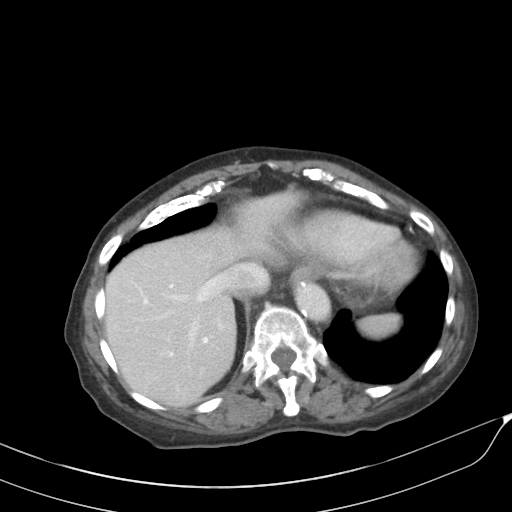
[im 69/80  lung]
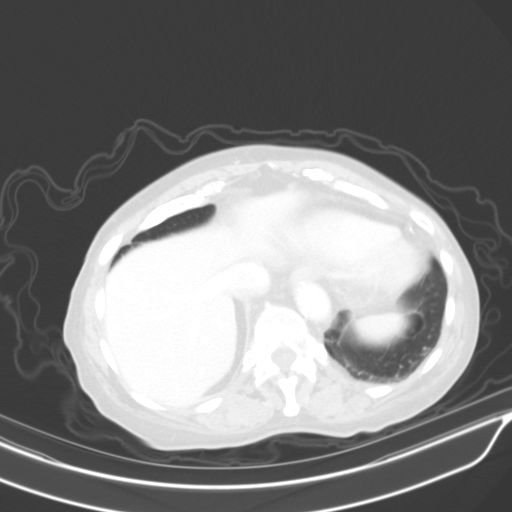
[im 74/80  soft-tissue]
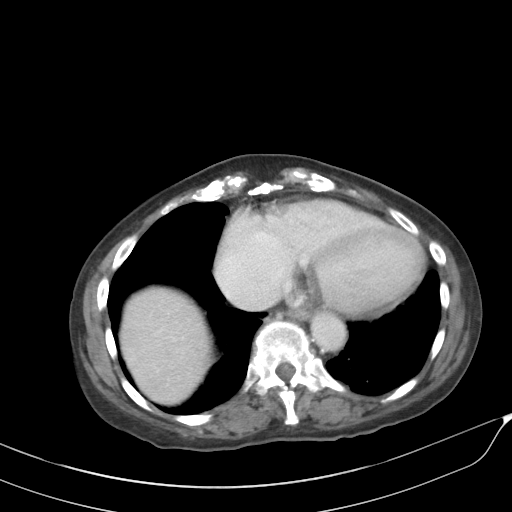
[im 74/80  lung]
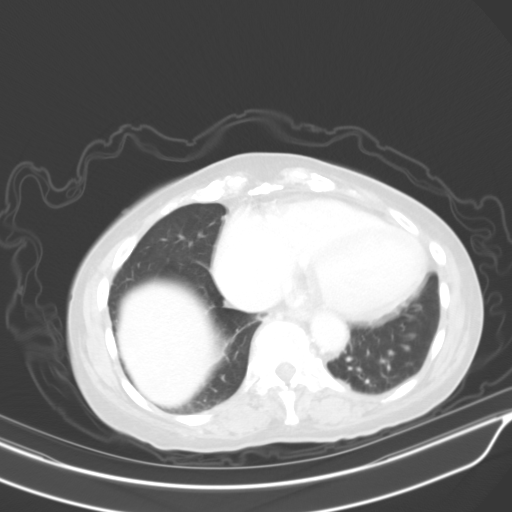

[13 of 32 positions shown; findings below may reference images not displayed]

FINDINGS: Lower Chest: No acute findings.

Hepatobiliary: No hepatic masses identified. Gallbladder is
unremarkable. No evidence of biliary ductal dilatation.

Pancreas:  No mass or inflammatory changes.

Spleen: Within normal limits in size and appearance.

Adrenals/Urinary Tract: No masses identified. Several tiny renal
cysts are seen bilaterally. Stable mild diffuse left renal atrophy.
No evidence of ureteral calculi or hydronephrosis.

Stomach/Bowel: No evidence of obstruction, inflammatory process or
abnormal fluid collections. Diffuse colonic diverticulosis is again
noted, however there is no evidence of diverticulitis.

Vascular/Lymphatic: No pathologically enlarged lymph nodes. No
abdominal aortic aneurysm. Aortic atherosclerotic calcification
noted.

Reproductive: Prior hysterectomy noted. Adnexal regions are
unremarkable in appearance.

Other:  None.

Musculoskeletal:  No suspicious bone lesions identified.
IMPRESSION: Colonic diverticulosis, without radiographic evidence of
diverticulitis or other acute findings.

Aortic Atherosclerosis ([0H]-[0H]).

## 2020-11-12 MED ORDER — IOPAMIDOL (ISOVUE-300) INJECTION 61%
100.0000 mL | Freq: Once | INTRAVENOUS | Status: AC | PRN
  Administered 2020-11-12: 100 mL via INTRAVENOUS

## 2020-11-18 ENCOUNTER — Other Ambulatory Visit: Payer: Self-pay

## 2020-11-18 ENCOUNTER — Ambulatory Visit (AMBULATORY_SURGERY_CENTER): Payer: Medicare Other | Admitting: Internal Medicine

## 2020-11-18 ENCOUNTER — Encounter: Payer: Self-pay | Admitting: Internal Medicine

## 2020-11-18 VITALS — BP 134/70 | HR 63 | Temp 97.1°F | Resp 18 | Ht 67.0 in | Wt 117.0 lb

## 2020-11-18 DIAGNOSIS — K219 Gastro-esophageal reflux disease without esophagitis: Secondary | ICD-10-CM

## 2020-11-18 DIAGNOSIS — K5900 Constipation, unspecified: Secondary | ICD-10-CM | POA: Diagnosis not present

## 2020-11-18 DIAGNOSIS — D500 Iron deficiency anemia secondary to blood loss (chronic): Secondary | ICD-10-CM | POA: Diagnosis not present

## 2020-11-18 DIAGNOSIS — K649 Unspecified hemorrhoids: Secondary | ICD-10-CM | POA: Diagnosis not present

## 2020-11-18 DIAGNOSIS — R634 Abnormal weight loss: Secondary | ICD-10-CM

## 2020-11-18 DIAGNOSIS — K5730 Diverticulosis of large intestine without perforation or abscess without bleeding: Secondary | ICD-10-CM | POA: Diagnosis not present

## 2020-11-18 DIAGNOSIS — K449 Diaphragmatic hernia without obstruction or gangrene: Secondary | ICD-10-CM

## 2020-11-18 DIAGNOSIS — D509 Iron deficiency anemia, unspecified: Secondary | ICD-10-CM | POA: Diagnosis not present

## 2020-11-18 DIAGNOSIS — R109 Unspecified abdominal pain: Secondary | ICD-10-CM

## 2020-11-18 MED ORDER — OMEPRAZOLE 20 MG PO CPDR: 20.0000 mg | DELAYED_RELEASE_CAPSULE | Freq: Every day | ORAL | 11 refills | Status: DC

## 2020-11-18 MED ORDER — SODIUM CHLORIDE 0.9 % IV SOLN: 500.0000 mL | Freq: Once | INTRAVENOUS | Status: DC

## 2020-11-18 NOTE — Op Note (Signed)
[facility] Patient Name: Elaine Turner Procedure Date: 11/18/2020 1:21 PM MRN: [id number] Endoscopist: Docia Chuck. Henrene Pastor , MD Age: 74 Referring MD:  Date of Birth: 1946-02-14 Gender: Female Account #: [id number] Procedure:                Upper GI endoscopy Indications:              Abdominal pain, Iron deficiency anemia Medicines:                Monitored Anesthesia Care Procedure:                Pre-Anesthesia Assessment:                           - Prior to the procedure, a History and Physical                            was performed, and patient medications and                            allergies were reviewed. The patient's tolerance of                            previous anesthesia was also reviewed. The risks                            and benefits of the procedure and the sedation                            options and risks were discussed with the patient.                            All questions were answered, and informed consent                            was obtained. Prior Anticoagulants: The patient has                            taken Plavix (clopidogrel), last dose was day of                            procedure. ASA Grade Assessment: III - A patient                            with severe systemic disease. After reviewing the                            risks and benefits, the patient was deemed in                            satisfactory condition to undergo the procedure.                           After obtaining informed consent, the endoscope was  passed under direct vision. Throughout the                            procedure, the patient's blood pressure, pulse, and                            oxygen saturations were monitored continuously. The                            Endoscope was introduced through the mouth, and                            advanced to the second part of duodenum. The upper                            GI  endoscopy was accomplished without difficulty.                            The patient tolerated the procedure well. Scope In: Scope Out: Findings:                 The esophagus revealed a mild esophagitis (erythema                            and edema).                           The stomach was normal save it save a small hiatal                            hernia.                           The examined duodenum was normal.                           The cardia and gastric fundus were normal on                            retroflexion. Complications:            No immediate complications. Estimated Blood Loss:     Estimated blood loss: none. Impression:               1. GERD                           2. Asymptomatic esophageal stricture and hernia                           3. Otherwise normal EGD. Recommendation:           1. Resume previous diet                           2. Continue current medications  3. Prescribe omeprazole 20 mg daily; #30; 11 refills                           4. For constipation take MiraLAX daily. May take                            more than 1 dose if needed                           5. Return to the care of Dr. Jenne Campus. Henrene Pastor, MD 11/18/2020 1:56:06 PM This report has been signed electronically.

## 2020-11-18 NOTE — Progress Notes (Signed)
PT taken to PACU. Monitors in place. VSS. Report given to RN. 

## 2020-11-18 NOTE — Op Note (Signed)
[facility] Patient Name: Elaine Turner Procedure Date: 11/18/2020 1:20 PM MRN: [id number] Endoscopist: Docia Chuck. Henrene Pastor , MD Age: 74 Referring MD:  Date of Birth: [birth year] Gender: Female Account #: [id number] Procedure:                Colonoscopy Indications:              Abdominal pain, Iron deficiency anemia, Constipation Medicines:                Monitored Anesthesia Care Procedure:                Pre-Anesthesia Assessment:                           - Prior to the procedure, a History and Physical                            was performed, and patient medications and                            allergies were reviewed. The patient's tolerance of                            previous anesthesia was also reviewed. The risks                            and benefits of the procedure and the sedation                            options and risks were discussed with the patient.                            All questions were answered, and informed consent                            was obtained. Prior Anticoagulants: The patient has                            taken Plavix (clopidogrel), last dose was day of                            procedure. ASA Grade Assessment: III - A patient                            with severe systemic disease. After reviewing the                            risks and benefits, the patient was deemed in                            satisfactory condition to undergo the procedure.                           After obtaining informed consent, the colonoscope  was passed under direct vision. Throughout the                            procedure, the patient's blood pressure, pulse, and                            oxygen saturations were monitored continuously. The                            [employer] was introduced through the anus                            and advanced to the the cecum, identified by                             appendiceal orifice and ileocecal valve. The                            ileocecal valve, appendiceal orifice, and rectum                            were photographed. The quality of the bowel                            preparation was excellent. The colonoscopy was                            performed without difficulty. The patient tolerated                            the procedure well. The bowel preparation used was                            SUPREP via split dose instruction. Scope In: 1:32:49 PM Scope Out: 1:42:08 PM Scope Withdrawal Time: 0 hours 6 minutes 48 seconds  Total Procedure Duration: 0 hours 9 minutes 19 seconds  Findings:                 Many small and large-mouthed diverticula were found                            in the entire colon.                           Internal hemorrhoids were found during retroflexion.                           The exam was otherwise without abnormality on                            direct and retroflexion views. Complications:            No immediate complications. Estimated blood loss:  None. Estimated Blood Loss:     Estimated blood loss: none. Impression:               - Diverticulosis in the entire examined colon.                           - Internal hemorrhoids.                           - The examination was otherwise normal on direct                            and retroflexion views.                           - No specimens collected. Recommendation:           - Repeat colonoscopy is not recommended for                            surveillance.                           - Patient has a contact number available for                            emergencies. The signs and symptoms of potential                            delayed complications were discussed with the                            patient. Return to normal activities tomorrow.                            Written discharge instructions were provided to the                             patient.                           - Resume previous diet.                           - Continue present medications.                           - EGD today. Please see report regarding findings                            and final recommendations Maicee Ullman N. Henrene Pastor, MD 11/18/2020 1:46:28 PM This report has been signed electronically.

## 2020-11-18 NOTE — Patient Instructions (Signed)
Take your new medicine 1/2 hour before breakfast.  Be sure to take it on an empty stomach. Read all of the handouts given to you by your recovery room nurse.  Thank-you for choosing [place] for your healthcare needs today.  YOU HAD AN ENDOSCOPIC PROCEDURE TODAY AT [facility]:   Refer to the procedure report that was given to you for any specific questions about what was found during the examination.  If the procedure report does not answer your questions, please call your gastroenterologist to clarify.  If you requested that your care partner not be given the details of your procedure findings, then the procedure report has been included in a sealed envelope for you to review at your convenience later.  YOU SHOULD EXPECT: Some feelings of bloating in the abdomen. Passage of more gas than usual.  Walking can help get rid of the air that was put into your GI tract during the procedure and reduce the bloating. If you had a lower endoscopy (such as a colonoscopy or flexible sigmoidoscopy) you may notice spotting of blood in your stool or on the toilet paper. If you underwent a bowel prep for your procedure, you may not have a normal bowel movement for a few days.  Please Note:  You might notice some irritation and congestion in your nose or some drainage.  This is from the oxygen used during your procedure.  There is no need for concern and it should clear up in a day or so.  SYMPTOMS TO REPORT IMMEDIATELY:   Following lower endoscopy (colonoscopy or flexible sigmoidoscopy):  Excessive amounts of blood in the stool  Significant tenderness or worsening of abdominal pains  Swelling of the abdomen that is new, acute  Fever of 100F or higher   Following upper endoscopy (EGD)  Vomiting of blood or coffee ground material  New chest pain or pain under the shoulder blades  Painful or persistently difficult swallowing  New shortness of breath  Fever of 100F or higher  Black, tarry-looking  stools  For urgent or emergent issues, a gastroenterologist can be reached at any hour by calling [phone]. Do not use MyChart messaging for urgent concerns.    DIET:  We do recommend a small meal at first, but then you may proceed to your regular diet.  Drink plenty of fluids but you should avoid alcoholic beverages for 24 hours. Try to increase the fiber in your diet, an drink plenty of water.  Take miralax as needed to keep your bowels regular.  ACTIVITY:  You should plan to take it easy for the rest of today and you should NOT DRIVE or use heavy machinery until tomorrow (because of the sedation medicines used during the test).    FOLLOW UP: Our staff will call the number listed on your records 48-72 hours following your procedure to check on you and address any questions or concerns that you may have regarding the information given to you following your procedure. If we do not reach you, we will leave a message.  We will attempt to reach you two times.  During this call, we will ask if you have developed any symptoms of COVID 19. If you develop any symptoms (ie: fever, flu-like symptoms, shortness of breath, cough etc.) before then, please call [phone].  If you test positive for Covid 19 in the 2 weeks post procedure, please call and report this information to [place].    If any biopsies were taken you  will be contacted by phone or by letter within the next 1-3 weeks.  Please call us at [phone] if you have not heard about the biopsies in 3 weeks.    SIGNATURES/CONFIDENTIALITY: You and/or your care partner have signed paperwork which will be entered into your electronic medical record.  These signatures attest to the fact that that the information above on your After Visit Summary has been reviewed and is understood.  Full responsibility of the confidentiality of this discharge information lies with you and/or your care-partner.

## 2020-11-18 NOTE — Progress Notes (Signed)
Pt's states no medical or surgical changes since previsit or office visit.   Cw vitals and JK Iv.

## 2020-11-19 ENCOUNTER — Telehealth: Payer: Self-pay | Admitting: *Deleted

## 2020-11-19 NOTE — Telephone Encounter (Signed)
  Follow up Call-  Call back number 11/18/2020  Post procedure Call Back phone  # [phone]  Permission to leave phone message Yes  Some recent data might be hidden     Patient questions:  Do you have a fever, pain , or abdominal swelling? No. Pain Score  0 *  Have you tolerated food without any problems? Yes.    Have you been able to return to your normal activities? Yes.    Do you have any questions about your discharge instructions: Diet   No. Medications  No. Follow up visit  No.  Do you have questions or concerns about your Care? No.  Actions: * If pain score is 4 or above: 1. No action needed, pain <4.Have you developed a fever since your procedure? no  2.   Have you had an respiratory symptoms (SOB or cough) since your procedure? no  3.   Have you tested positive for COVID 19 since your procedure no  4.   Have you had any family members/close contacts diagnosed with the COVID 19 since your procedure?  no   If yes to any of these questions please route to Joylene John, RN and Joella Prince, RN

## 2020-12-07 LAB — CUP PACEART REMOTE DEVICE CHECK
Date Time Interrogation Session: 20211210014511
Implantable Pulse Generator Implant Date: 20200528

## 2020-12-09 ENCOUNTER — Ambulatory Visit (INDEPENDENT_AMBULATORY_CARE_PROVIDER_SITE_OTHER): Payer: Medicare Other

## 2020-12-09 DIAGNOSIS — I639 Cerebral infarction, unspecified: Secondary | ICD-10-CM

## 2020-12-09 DIAGNOSIS — M47816 Spondylosis without myelopathy or radiculopathy, lumbar region: Secondary | ICD-10-CM | POA: Diagnosis not present

## 2020-12-23 NOTE — Progress Notes (Signed)
Carelink Summary Report / Loop Recorder 

## 2021-01-08 DIAGNOSIS — M5136 Other intervertebral disc degeneration, lumbar region: Secondary | ICD-10-CM | POA: Diagnosis not present

## 2021-01-08 DIAGNOSIS — R200 Anesthesia of skin: Secondary | ICD-10-CM | POA: Diagnosis not present

## 2021-01-13 ENCOUNTER — Ambulatory Visit (INDEPENDENT_AMBULATORY_CARE_PROVIDER_SITE_OTHER): Payer: Medicare Other

## 2021-01-13 DIAGNOSIS — I639 Cerebral infarction, unspecified: Secondary | ICD-10-CM | POA: Diagnosis not present

## 2021-01-13 LAB — CUP PACEART REMOTE DEVICE CHECK
Date Time Interrogation Session: 20220112030134
Implantable Pulse Generator Implant Date: 20200528

## 2021-01-21 ENCOUNTER — Other Ambulatory Visit: Payer: Self-pay | Admitting: Internal Medicine

## 2021-01-25 ENCOUNTER — Other Ambulatory Visit: Payer: Self-pay | Admitting: Internal Medicine

## 2021-01-25 NOTE — Progress Notes (Signed)
Carelink Summary Report / Loop Recorder 

## 2021-02-12 LAB — CUP PACEART REMOTE DEVICE CHECK
Date Time Interrogation Session: 20220214033206
Implantable Pulse Generator Implant Date: 20200528

## 2021-02-17 ENCOUNTER — Ambulatory Visit (INDEPENDENT_AMBULATORY_CARE_PROVIDER_SITE_OTHER): Payer: Medicare Other

## 2021-02-17 DIAGNOSIS — I639 Cerebral infarction, unspecified: Secondary | ICD-10-CM | POA: Diagnosis not present

## 2021-02-21 NOTE — Progress Notes (Signed)
Carelink Summary Report / Loop Recorder 

## 2021-02-24 DIAGNOSIS — M21621 Bunionette of right foot: Secondary | ICD-10-CM | POA: Diagnosis not present

## 2021-02-24 DIAGNOSIS — F17200 Nicotine dependence, unspecified, uncomplicated: Secondary | ICD-10-CM | POA: Diagnosis not present

## 2021-03-03 ENCOUNTER — Telehealth: Payer: Self-pay | Admitting: Internal Medicine

## 2021-03-03 ENCOUNTER — Emergency Department (HOSPITAL_COMMUNITY)
Admission: EM | Admit: 2021-03-03 | Discharge: 2021-03-03 | Disposition: A | Discharge status: Home / Self Care | Payer: Medicare Other | Attending: Emergency Medicine | Admitting: Emergency Medicine

## 2021-03-03 ENCOUNTER — Other Ambulatory Visit: Payer: Self-pay

## 2021-03-03 ENCOUNTER — Emergency Department (HOSPITAL_COMMUNITY): Payer: Medicare Other

## 2021-03-03 ENCOUNTER — Encounter (HOSPITAL_COMMUNITY): Payer: Self-pay

## 2021-03-03 DIAGNOSIS — I517 Cardiomegaly: Secondary | ICD-10-CM | POA: Diagnosis not present

## 2021-03-03 DIAGNOSIS — J181 Lobar pneumonia, unspecified organism: Secondary | ICD-10-CM | POA: Insufficient documentation

## 2021-03-03 DIAGNOSIS — R079 Chest pain, unspecified: Secondary | ICD-10-CM | POA: Diagnosis not present

## 2021-03-03 DIAGNOSIS — J441 Chronic obstructive pulmonary disease with (acute) exacerbation: Secondary | ICD-10-CM | POA: Insufficient documentation

## 2021-03-03 DIAGNOSIS — R0789 Other chest pain: Secondary | ICD-10-CM | POA: Diagnosis not present

## 2021-03-03 DIAGNOSIS — Z7902 Long term (current) use of antithrombotics/antiplatelets: Secondary | ICD-10-CM | POA: Insufficient documentation

## 2021-03-03 DIAGNOSIS — J189 Pneumonia, unspecified organism: Secondary | ICD-10-CM

## 2021-03-03 DIAGNOSIS — Z79899 Other long term (current) drug therapy: Secondary | ICD-10-CM | POA: Diagnosis not present

## 2021-03-03 DIAGNOSIS — I129 Hypertensive chronic kidney disease with stage 1 through stage 4 chronic kidney disease, or unspecified chronic kidney disease: Secondary | ICD-10-CM | POA: Insufficient documentation

## 2021-03-03 DIAGNOSIS — F17210 Nicotine dependence, cigarettes, uncomplicated: Secondary | ICD-10-CM | POA: Diagnosis not present

## 2021-03-03 DIAGNOSIS — E010 Iodine-deficiency related diffuse (endemic) goiter: Secondary | ICD-10-CM | POA: Insufficient documentation

## 2021-03-03 DIAGNOSIS — J9811 Atelectasis: Secondary | ICD-10-CM | POA: Diagnosis not present

## 2021-03-03 DIAGNOSIS — J811 Chronic pulmonary edema: Secondary | ICD-10-CM | POA: Diagnosis not present

## 2021-03-03 DIAGNOSIS — Z7982 Long term (current) use of aspirin: Secondary | ICD-10-CM | POA: Diagnosis not present

## 2021-03-03 DIAGNOSIS — I10 Essential (primary) hypertension: Secondary | ICD-10-CM | POA: Diagnosis not present

## 2021-03-03 DIAGNOSIS — N183 Chronic kidney disease, stage 3 unspecified: Secondary | ICD-10-CM | POA: Insufficient documentation

## 2021-03-03 LAB — CBC
HCT: 34.6 % — ABNORMAL LOW (ref 36.0–46.0)
Hemoglobin: 10.6 g/dL — ABNORMAL LOW (ref 12.0–15.0)
MCH: 23.5 pg — ABNORMAL LOW (ref 26.0–34.0)
MCHC: 30.6 g/dL (ref 30.0–36.0)
MCV: 76.5 fL — ABNORMAL LOW (ref 80.0–100.0)
Platelets: 454 10*3/uL — ABNORMAL HIGH (ref 150–400)
RBC: 4.52 MIL/uL (ref 3.87–5.11)
RDW: 19.5 % — ABNORMAL HIGH (ref 11.5–15.5)
WBC: 17.2 10*3/uL — ABNORMAL HIGH (ref 4.0–10.5)
nRBC: 0 % (ref 0.0–0.2)

## 2021-03-03 LAB — D-DIMER, QUANTITATIVE: D-Dimer, Quant: 1.1 ug/mL-FEU — ABNORMAL HIGH (ref 0.00–0.50)

## 2021-03-03 LAB — BASIC METABOLIC PANEL
Anion gap: 12 (ref 5–15)
BUN: 23 mg/dL (ref 8–23)
CO2: 22 mmol/L (ref 22–32)
Calcium: 9.5 mg/dL (ref 8.9–10.3)
Chloride: 109 mmol/L (ref 98–111)
Creatinine, Ser: 1.39 mg/dL — ABNORMAL HIGH (ref 0.44–1.00)
GFR, Estimated: 39 mL/min — ABNORMAL LOW (ref >60)
Glucose, Bld: 110 mg/dL — ABNORMAL HIGH (ref 70–99)
Potassium: 4 mmol/L (ref 3.5–5.1)
Sodium: 143 mmol/L (ref 135–145)

## 2021-03-03 LAB — TROPONIN I (HIGH SENSITIVITY)
Troponin I (High Sensitivity): 7 ng/L (ref <18)
Troponin I (High Sensitivity): 6 ng/L (ref <18)

## 2021-03-03 LAB — TSH: TSH: 0.569 u[IU]/mL (ref 0.350–4.500)

## 2021-03-03 IMAGING — CT CT ANGIO CHEST
2 of 6 series · 18 of 36 positions shown · IV contrast (omnipaque)
Comparison: CT [DATE], [DATE], chest x-ray [DATE]

CLINICAL DATA: Chest pain

EXAM:
CT ANGIOGRAPHY CHEST WITH CONTRAST
TECHNIQUE: Multidetector CT imaging of the chest was performed using the
standard protocol during bolus administration of intravenous
contrast. Multiplanar CT image reconstructions and MIPs were
obtained to evaluate the vascular anatomy.
CONTRAST:  75mL OMNIPAQUE IOHEXOL 350 MG/ML SOLN

[Series 5: thins · axial · 0.68mm/px · z∈[-323,-80]mm · 17 of 275 slices shown]
[im 16/275  lung]
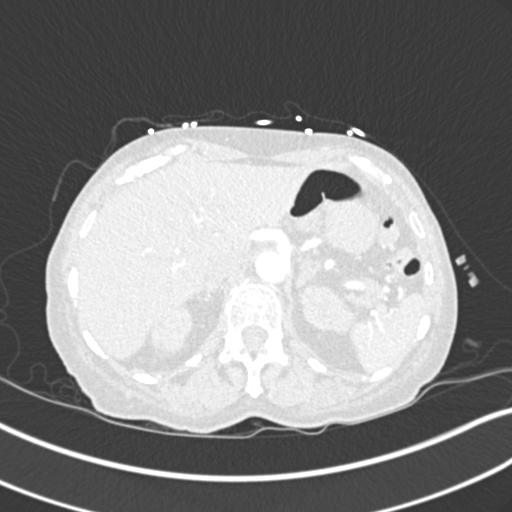
[im 31/275  mediastinal]
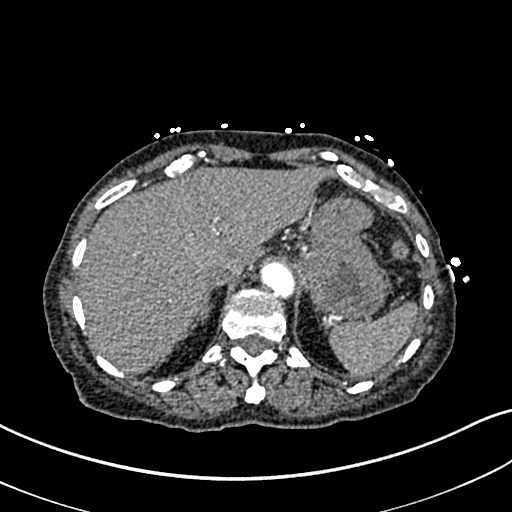
[im 46/275  lung]
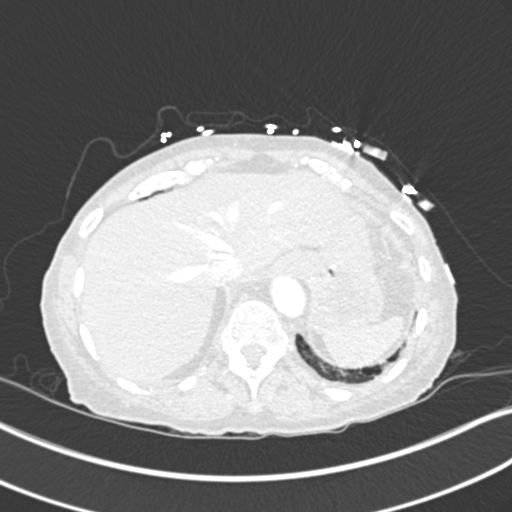
[im 61/275  mediastinal]
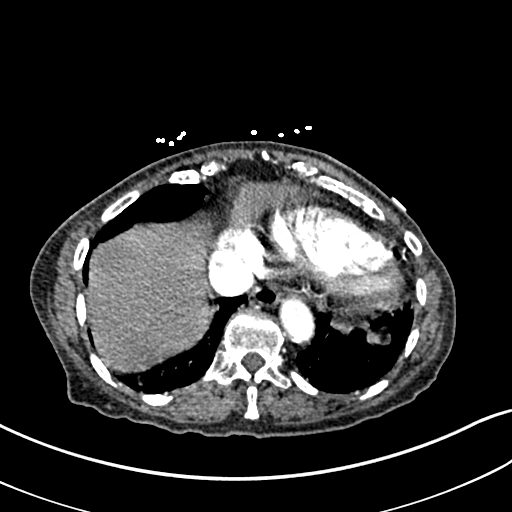
[im 77/275  lung]
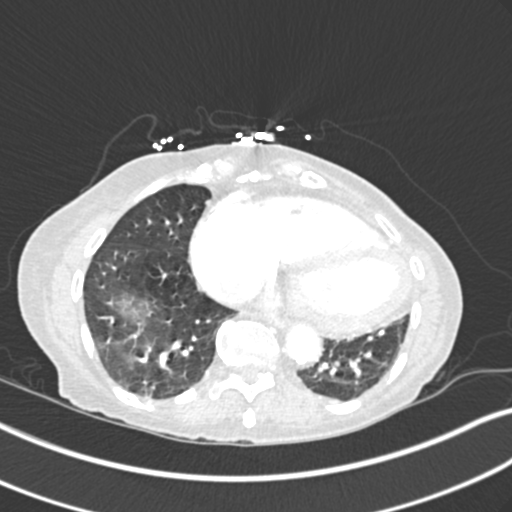
[im 92/275  mediastinal]
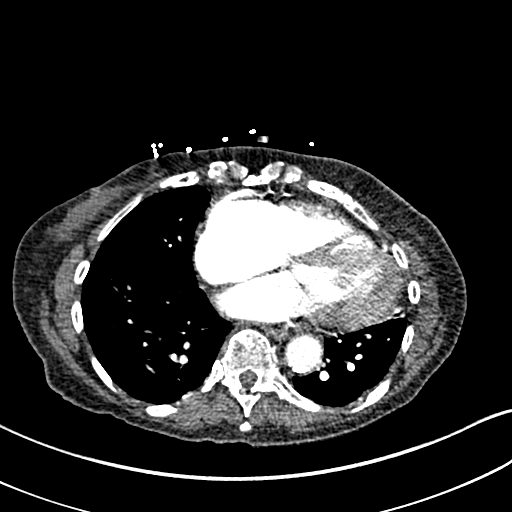
[im 107/275  lung]
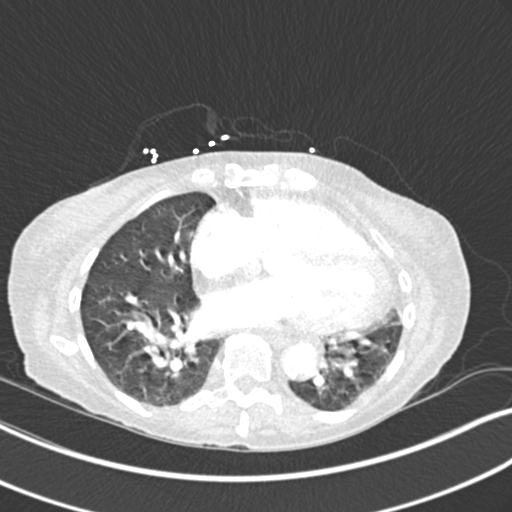
[im 122/275  mediastinal]
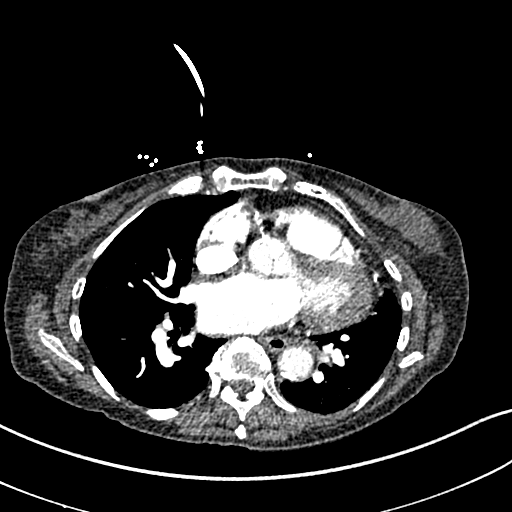
[im 138/275  lung]
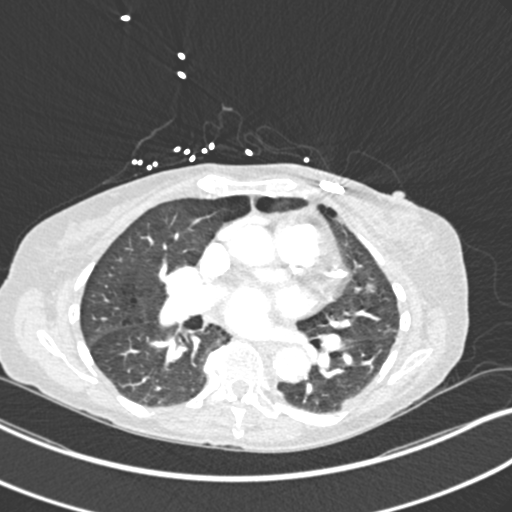
[im 153/275  mediastinal]
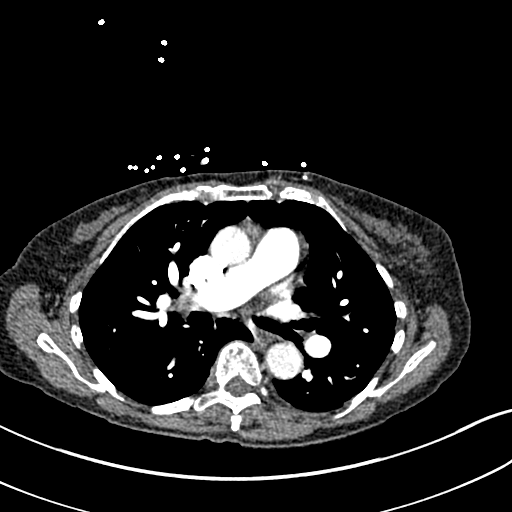
[im 168/275  lung]
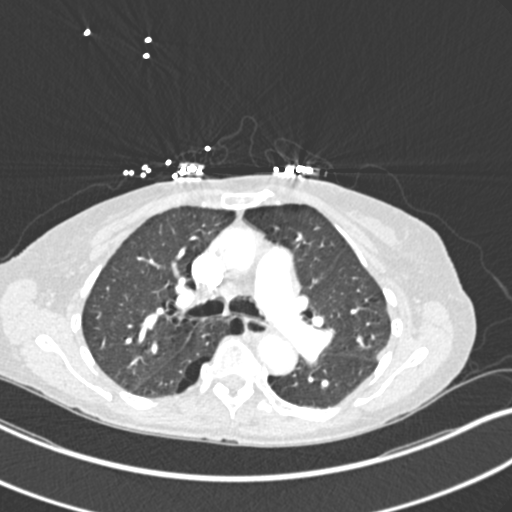
[im 183/275  mediastinal]
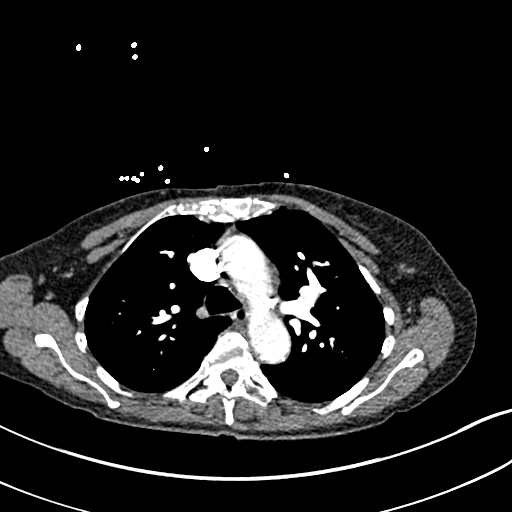
[im 198/275  lung]
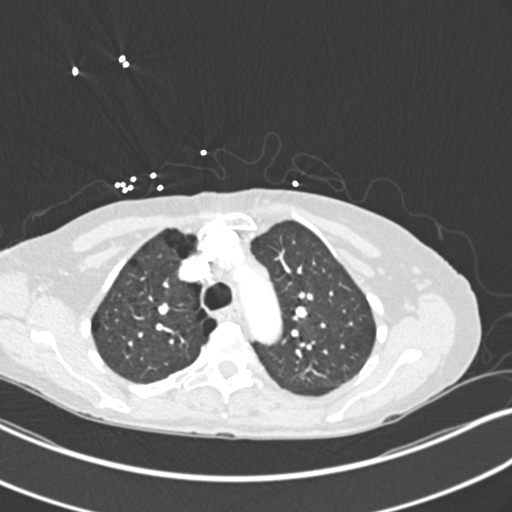
[im 214/275  mediastinal]
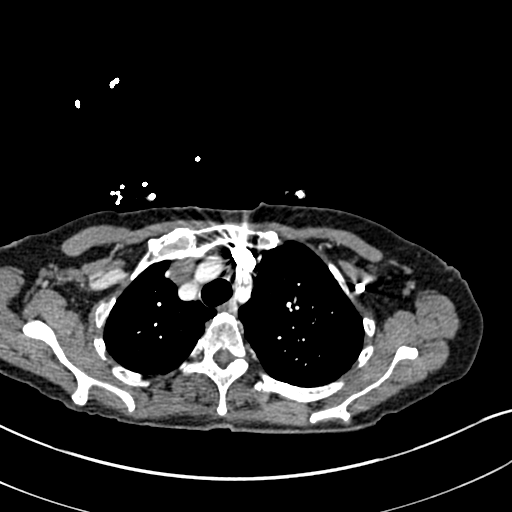
[im 229/275  lung]
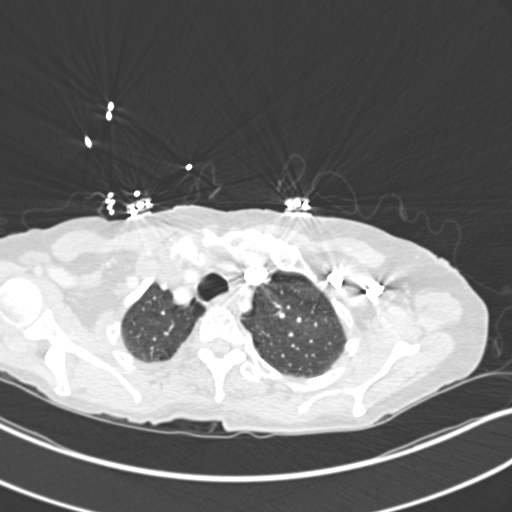
[im 244/275  mediastinal]
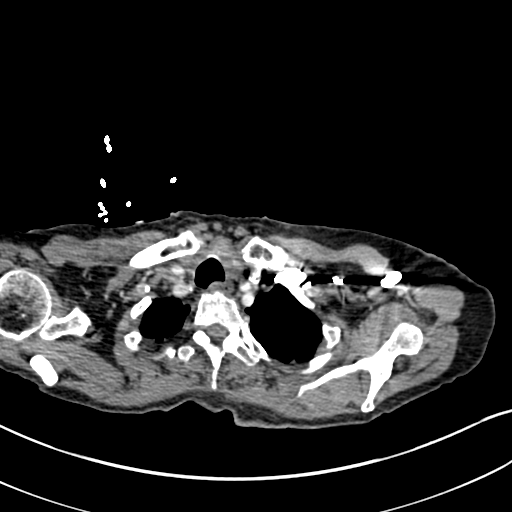
[im 259/275  lung]
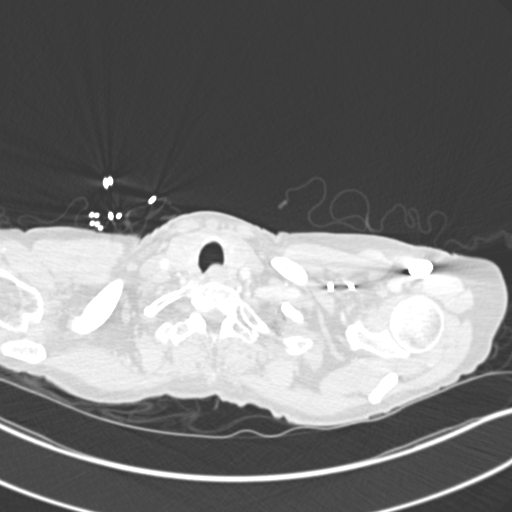

[Series 6: coronal mpr · coronal · 0.54mm/px · 1 of 115 slices shown]
[im 58/115  mediastinal]
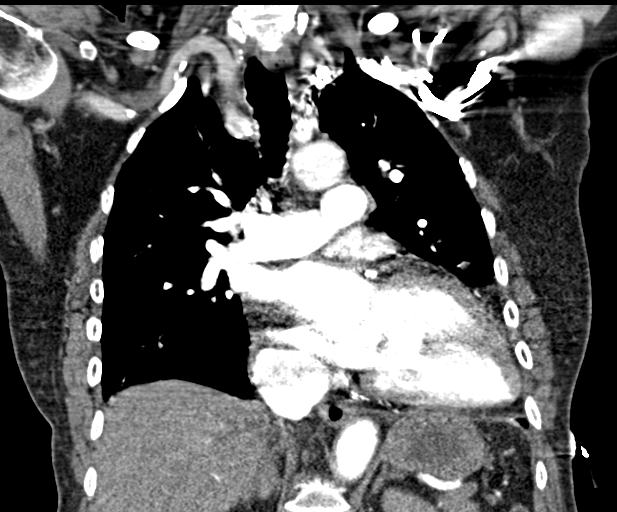

[18 of 36 positions shown; findings below may reference images not displayed]

FINDINGS: Technical note: Examination is degraded by respiratory motion
artifact.

Cardiovascular: Satisfactory opacification of the pulmonary arteries
to the segmental level. No evidence of pulmonary embolism.
Respiratory motion artifact degrades evaluation of the segmental and
subsegmental branches within the bilateral lung bases. Thoracic
aorta is nonaneurysmal. Atherosclerotic calcifications of the aorta
and coronary arteries. Heart size is enlarged. No pericardial
effusion.

Mediastinum/Nodes: No axillary, mediastinal, or hilar
lymphadenopathy. Trachea and esophagus within normal limits.
Enlarged, heterogeneous thyroid gland.

Lungs/Pleura: Small focal airspace opacity within the superior
aspect of the right lower lobe (series 10, image 75) measuring 11 x
11 mm. 9 mm noncalcified nodule within the lingula is stable
compared to [CT], benign. Mild bibasilar atelectasis. Lungs are
otherwise clear. No pleural effusion or pneumothorax. Mild
paraseptal emphysema within the upper lobes.

Upper Abdomen: Reflux of contrast into the IVC and hepatic veins. No
acute findings within the visualized portion of the upper abdomen.

Musculoskeletal: No chest wall abnormality. No acute or significant
osseous findings.

Review of the MIP images confirms the above findings.
IMPRESSION: 1. Negative for pulmonary embolism.
2. Small focal airspace opacity within the superior aspect of the
right lower lobe, favored to represent an infectious or inflammatory
process. Consider follow-up noncontrast chest CT in 3 months to
exclude the presence of an underlying nodule.
3. Cardiomegaly with reflux of contrast into the IVC and hepatic
veins suggesting right heart dysfunction.
4. Enlarged, heterogeneous thyroid gland. Recommend nonemergent
thyroid ultrasound (ref: [HOSPITAL]. [DATE]): 143-50).

Aortic Atherosclerosis ([CT]-[CT]) and Emphysema ([CT]-[CT]).

## 2021-03-03 IMAGING — CR DG CHEST 2V
2 series · 2 of 2 positions shown · non-contrast
Comparison: [DATE] and [DATE]

CLINICAL DATA: Chest pain

EXAM:
CHEST - 2 VIEW

[w chest lat]
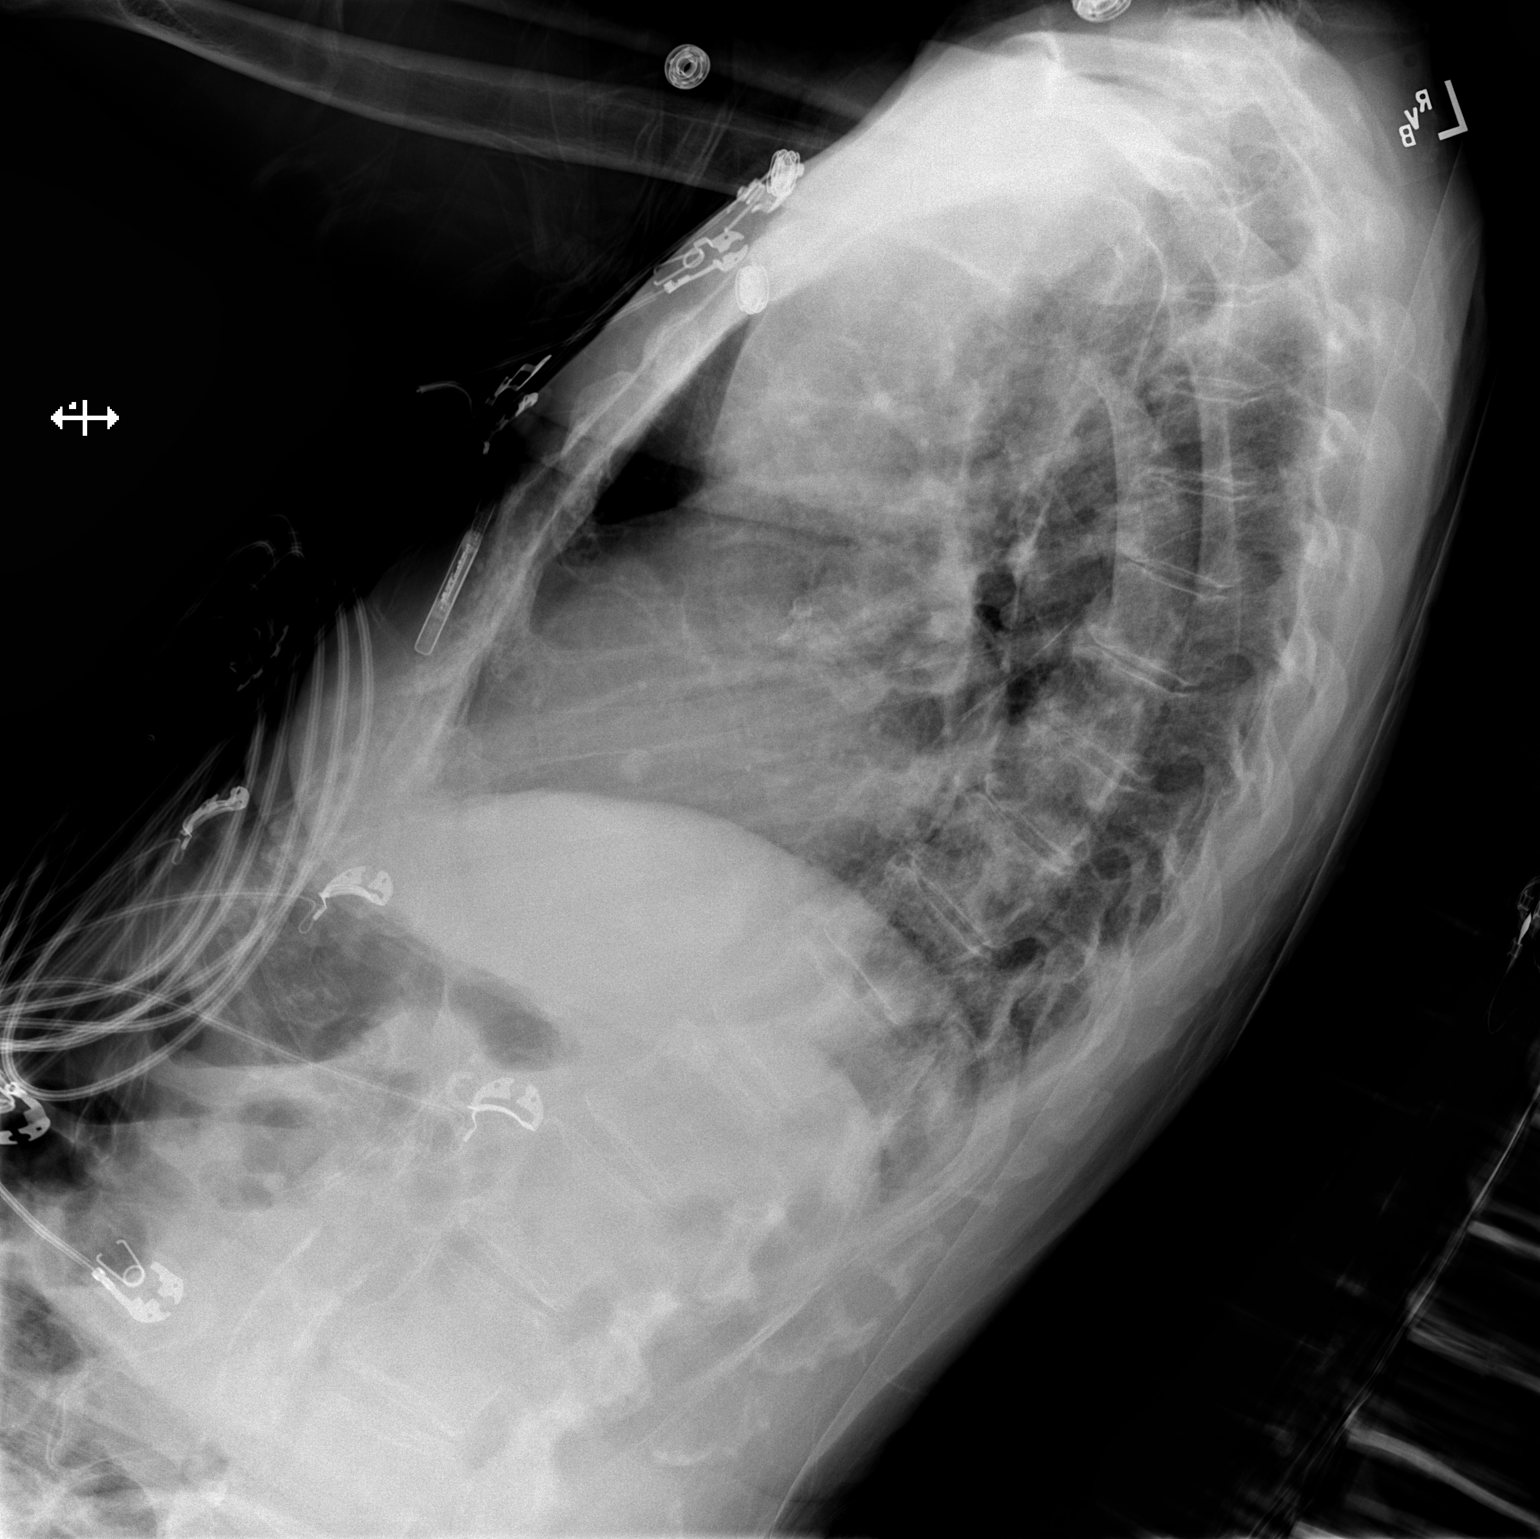

[x chest ap]
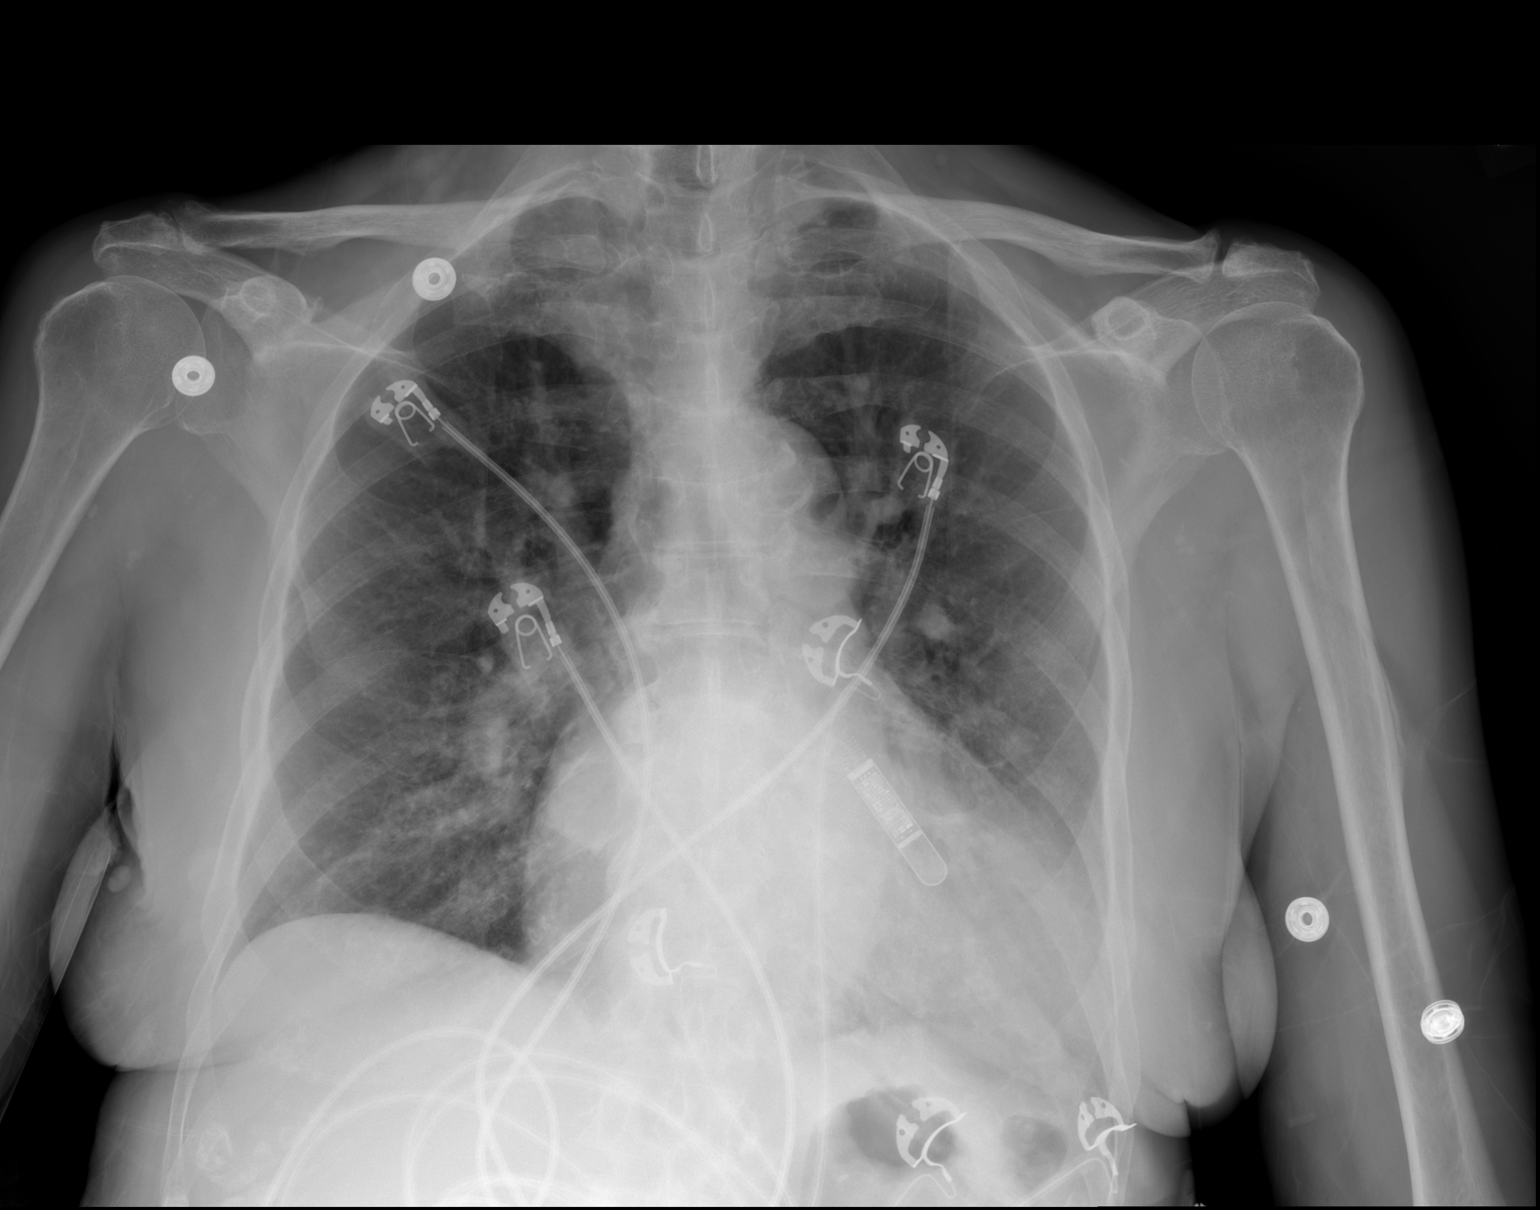

[2 of 2 positions shown; findings below may reference images not displayed]

FINDINGS: There is no edema or airspace opacity. On the lateral view, there is
a nodular opacity overlying the heart anteriorly measuring 0.9 x
cm, not seen previously. Heart is mildly enlarged, stable, with
pulmonary venous hypertension. There is aortic atherosclerosis.
There is a loop recorder on the left anteriorly. There is mild
degenerative change in the thoracic spine.
IMPRESSION: 1. 9 mm nodular opacity overlying the heart anteriorly, seen only on
the lateral view. Given a change from prior studies, noncontrast
chest CT to further evaluate advised.

2.  No edema or airspace opacity.

3.  Cardiomegaly with pulmonary vascular congestion again noted.

4.  Aortic Atherosclerosis ([8J]-[8J]).

## 2021-03-03 MED ORDER — ASPIRIN 325 MG PO TABS
325.0000 mg | ORAL_TABLET | Freq: Every day | ORAL | Status: DC
  Filled 2021-03-03: qty 1

## 2021-03-03 MED ORDER — HYDROMORPHONE HCL 1 MG/ML IJ SOLN
1.0000 mg | Freq: Once | INTRAMUSCULAR | Status: AC
  Administered 2021-03-03: 1 mg via INTRAVENOUS

## 2021-03-03 MED ORDER — DOXYCYCLINE HYCLATE 100 MG PO TABS
100.0000 mg | ORAL_TABLET | Freq: Once | ORAL | Status: AC
  Administered 2021-03-03: 100 mg via ORAL
  Filled 2021-03-03: qty 1

## 2021-03-03 MED ORDER — IOHEXOL 350 MG/ML SOLN
100.0000 mL | Freq: Once | INTRAVENOUS | Status: AC | PRN
  Administered 2021-03-03: 75 mL via INTRAVENOUS

## 2021-03-03 MED ORDER — HYDROMORPHONE HCL 1 MG/ML IJ SOLN
1.0000 mg | Freq: Once | INTRAMUSCULAR | Status: DC
  Filled 2021-03-03 (×2): qty 1

## 2021-03-03 MED ORDER — DOXYCYCLINE HYCLATE 100 MG PO CAPS: 100.0000 mg | ORAL_CAPSULE | Freq: Two times a day (BID) | ORAL | 0 refills | Status: DC

## 2021-03-03 MED ORDER — ONDANSETRON HCL 4 MG/2ML IJ SOLN
4.0000 mg | Freq: Once | INTRAMUSCULAR | Status: AC
  Administered 2021-03-03: 4 mg via INTRAVENOUS
  Filled 2021-03-03: qty 2

## 2021-03-03 MED ORDER — MORPHINE SULFATE (PF) 4 MG/ML IV SOLN
4.0000 mg | Freq: Once | INTRAVENOUS | Status: AC
Start: 2021-03-03 — End: 2021-03-03
  Administered 2021-03-03: 4 mg via INTRAVENOUS
  Filled 2021-03-03: qty 1

## 2021-03-03 MED ORDER — NITROGLYCERIN 0.4 MG SL SUBL
0.4000 mg | SUBLINGUAL_TABLET | SUBLINGUAL | Status: DC | PRN
  Administered 2021-03-03 (×3): 0.4 mg via SUBLINGUAL
  Filled 2021-03-03: qty 1

## 2021-03-03 MED ORDER — HYDROCODONE-ACETAMINOPHEN 5-325 MG PO TABS: 1.0000 | ORAL_TABLET | ORAL | 0 refills | Status: DC | PRN

## 2021-03-03 NOTE — Telephone Encounter (Signed)
Advised to go to ED now. Patient understood and agreed.

## 2021-03-03 NOTE — ED Provider Notes (Signed)
[facility] DEPT Provider Note   CSN: [id number] Arrival date & time: 03/03/21  0855     History Chief Complaint  Patient presents with  . Chest Pain    Elaine Turner is a 75 y.o. female.  Pt presents to the ED today with cp.  Pt said it hurts when she takes a deep breath and moves.  She said it does radiate up the left side of her neck.  No sob.  No n/v.  No f/c.        Past Medical History:  Diagnosis Date  . 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 Aortic atherosclerosis ([place])   . Chronic diarrhea   . COPD  GOLD 0 / active smoker  10/24/2015  . Diverticulitis   . Diverticulosis   . Esophageal stricture   . Heart murmur   . Hemorrhoids   . Hyperglycemia 05/01/2013  . Hypertension   . 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 Pneumonia 05/01/2013  . PVD (peripheral vascular disease) ([place])   . Urine incontinence     Patient Active Problem List   Diagnosis Date Noted  . Aortic atherosclerosis ([place]) 11/12/2020  . Left groin pain 10/15/2020  . B12 deficiency 10/10/2020  . Vitamin D deficiency 10/10/2020  . Fatigue 10/03/2020  . Left low back pain 10/03/2020  . LLQ pain 10/03/2020  . Thrush 09/10/2020  . Anxiety and depression 01/24/2020  . Low back pain 05/31/2019  . Central retinal artery occlusion 05/25/2019  . Ischemic optic neuropathy of left eye 05/24/2019  . Tobacco dependence 05/24/2019  . CKD (chronic kidney disease) stage 3, GFR 30-59 ml/min (HCC) 05/24/2019  . Nasal obstruction 01/06/2019  . Sinus infection 09/16/2018  . Abdominal pain 08/18/2018  . Allergic rhinitis 07/28/2018  . Allergic conjunctivitis 07/28/2018  . Sigmoid diverticulitis 02/15/2018  . Acute respiratory distress 09/30/2017  . AKI (acute kidney injury) ([place])   . COPD with acute exacerbation ([place])   . Cough 08/09/2017  . Weight loss 06/17/2017  . Constipation 06/17/2017  . Skin tag  03/19/2017  . Abnormal breath sounds 03/25/2016  . COPD  GOLD 0 / active smoker  10/24/2015  . Solitary pulmonary nodule 05/31/2015  . Left knee pain 02/09/2014  . Primary localized osteoarthrosis, lower leg 02/09/2014  . Anemia, unspecified 05/15/2013  . Hypokalemia 05/08/2013  . Hemorrhoids 05/02/2013  . CAP (community acquired pneumonia) 05/01/2013  . Hyponatremia 05/01/2013  . Hyperglycemia 05/01/2013  . Atherosclerosis of native arteries of the extremities with intermittent claudication 10/14/2012  . Osteoarthritis 09/08/2012  . Cigarette smoker 09/08/2012  . Insomnia 09/08/2012  . Hyperlipidemia 09/08/2012  . Heart murmur, aortic 09/08/2012  . Encounter for well adult exam with abnormal findings 09/02/2012  . Hypertension   . PVD (peripheral vascular disease) ([place])     Past Surgical History:  Procedure Laterality Date  . ABDOMINAL HYSTERECTOMY    . CARPAL TUNNEL RELEASE Right 09/2019  . HEMORRHOID SURGERY N/A 06/26/2015   Procedure: INTERNAL AND EXTERNAL HEMORRHOIDECTOMY;  Surgeon: Georganna Skeans, MD;  Location: [facility];  Service: General;  Laterality: N/A;  . LOOP RECORDER INSERTION N/A 05/25/2019   Procedure: LOOP RECORDER INSERTION;  Surgeon: Constance Haw, MD;  Location: [facility];  Service: Cardiovascular;  Laterality: N/A;     OB History   No obstetric history on file.     Family History  Problem Relation Age of Onset  .  Hypertension Mother   . Lung cancer Father   . Colon cancer Neg Hx   . Esophageal cancer Neg Hx   . Pancreatic cancer Neg Hx   . Liver disease Neg Hx   . Stroke Neg Hx     Social History   Tobacco Use  . Smoking status: Current Every Day Smoker    Packs/day: 1.00    Years: 62.00    Pack years: 62.00    Types: Cigarettes  . Smokeless tobacco: Never Used  Vaping Use  . Vaping Use: Never used  Substance Use Topics  . Alcohol use: Yes    Comment: pt reports drinking 1 pint  per week  . Drug use: No     Home Medications Prior to Admission medications   Medication Sig Start Date End Date Taking? Authorizing Provider  doxycycline (VIBRAMYCIN) 100 MG capsule Take 1 capsule (100 mg total) by mouth 2 (two) times daily. 03/03/21  Yes Isla Pence, MD  HYDROcodone-acetaminophen (NORCO/VICODIN) 5-325 MG tablet Take 1 tablet by mouth every 4 (four) hours as needed. 03/03/21  Yes Isla Pence, MD  albuterol (VENTOLIN HFA) 108 (90 Base) MCG/ACT inhaler Inhale 2 puffs into the lungs every 6 (six) hours as needed for wheezing or shortness of breath. 09/10/20   Biagio Borg, MD  aspirin 81 MG EC tablet Take 1 tablet (81 mg total) by mouth daily. Swallow whole. 03/19/17   Biagio Borg, MD  atorvastatin (LIPITOR) 40 MG tablet TAKE 1 TABLET (40 MG TOTAL) BY MOUTH DAILY AT 6 PM. 07/23/20   Biagio Borg, MD  benzonatate (TESSALON) 100 MG capsule Take 1 capsule (100 mg total) by mouth every 8 (eight) hours. Patient not taking: Reported on 11/04/2020 09/01/20   Faustino Congress, NP  bisacodyl (DULCOLAX) 5 MG EC tablet Take 5 mg by mouth daily as needed for moderate constipation. Patient states she takes 6 a day when she is constipated    [provider]  clopidogrel (PLAVIX) 75 MG tablet TAKE 1 TABLET BY MOUTH EVERY DAY 01/25/21   Biagio Borg, MD  feeding supplement, ENSURE ENLIVE, (ENSURE ENLIVE) LIQD Take 237 mLs by mouth daily at 3 pm. 05/26/19   Cristal Ford, DO  lactulose ([place]) 10 GM/15ML solution TAKE 30 MLS BY MOUTH 2 TIMES DAILY AS NEEDED FOR MILD CONSTIPATION OR MODERATE CONSTIPATION. Patient not taking: Reported on 11/04/2020 10/31/20   Biagio Borg, MD  losartan (COZAAR) 50 MG tablet Take 1 tablet (50 mg total) by mouth daily. 01/24/20   Biagio Borg, MD  NIFEdipine (PROCARDIA XL/NIFEDICAL-XL) 90 MG 24 hr tablet TAKE 1 TABLET BY MOUTH EVERY DAY 04/11/20   Biagio Borg, MD  omeprazole (PRILOSEC) 20 MG capsule Take 1 capsule (20 mg total) by mouth daily. 11/18/20   Irene Shipper, MD   senna (SENOKOT) 8.6 MG tablet Take 1 tablet (8.6 mg total) by mouth daily. Patient not taking: Reported on 11/04/2020 10/03/20   Biagio Borg, MD  tiZANidine (ZANAFLEX) 2 MG tablet TAKE 1 TABLET (2 MG TOTAL) BY MOUTH EVERY 6 (SIX) HOURS AS NEEDED FOR MUSCLE SPASMS. 10/04/20   Biagio Borg, MD  zolpidem (AMBIEN) 5 MG tablet TAKE 1 TABLET (5 MG TOTAL) BY MOUTH AT BEDTIME AS NEEDED FOR SLEEP. 04/26/20   Biagio Borg, MD  citalopram (CELEXA) 10 MG tablet Take 1 tablet (10 mg total) by mouth daily. 01/24/20 09/01/20  Biagio Borg, MD  fluticasone (FLONASE) 50 MCG/ACT nasal spray Place 2  sprays into both nostrils daily. Patient taking differently: Place 2 sprays into both nostrils as needed for rhinitis.  07/28/18 09/01/20  Biagio Borg, MD    Allergies    Ace inhibitors and Levaquin [levofloxacin]  Review of Systems   Review of Systems  Cardiovascular: Positive for chest pain.  All other systems reviewed and are negative.   Physical Exam Updated Vital Signs BP (!) 188/90   Pulse 60   Temp 98.7 F (37.1 C) (Oral)   Resp 13   Ht 5\' 2"  (1.575 m)   Wt 52.2 kg   SpO2 92%   BMI 21.03 kg/m   Physical Exam Vitals and nursing note reviewed.  Constitutional:      Appearance: She is well-developed.  HENT:     Head: Normocephalic and atraumatic.  Eyes:     Extraocular Movements: Extraocular movements intact.     Pupils: Pupils are equal, round, and reactive to light.  Cardiovascular:     Rate and Rhythm: Normal rate and regular rhythm.     Heart sounds: Normal heart sounds.  Pulmonary:     Effort: Pulmonary effort is normal.     Breath sounds: Normal breath sounds.  Abdominal:     General: Bowel sounds are normal.     Palpations: Abdomen is soft.  Musculoskeletal:        General: Normal range of motion.     Cervical back: Normal range of motion and neck supple.  Skin:    General: Skin is warm and dry.     Capillary Refill: Capillary refill takes less than 2 seconds.  Neurological:      General: No focal deficit present.     Mental Status: She is alert and oriented to person, place, and time.     ED Results / Procedures / Treatments   Labs (all labs ordered are listed, but only abnormal results are displayed) Labs Reviewed  BASIC METABOLIC PANEL - Abnormal; Notable for the following components:      Result Value   Glucose, Bld 110 (*)    Creatinine, Ser 1.39 (*)    GFR, Estimated 39 (*)    All other components within normal limits  CBC - Abnormal; Notable for the following components:   WBC 17.2 (*)    Hemoglobin 10.6 (*)    HCT 34.6 (*)    MCV 76.5 (*)    MCH 23.5 (*)    RDW 19.5 (*)    Platelets 454 (*)    All other components within normal limits  D-DIMER, QUANTITATIVE - Abnormal; Notable for the following components:   D-Dimer, Quant 1.10 (*)    All other components within normal limits  URINALYSIS, ROUTINE W REFLEX MICROSCOPIC  TSH  TROPONIN I (HIGH SENSITIVITY)  TROPONIN I (HIGH SENSITIVITY)    EKG EKG Interpretation  Date/Time:  Monday March 03 2021 09:06:39 EST Ventricular Rate:  55 PR Interval:    QRS Duration: 95 QT Interval:  447 QTC Calculation: 428 R Axis:   24 Text Interpretation: Sinus rhythm ST elevation, consider inferior injury 12 Lead; [place] Poor data quality will need repeat Confirmed by Isla Pence [phone]) on 03/03/2021 10:11:45 AM   Radiology DG Chest 2 View  Result Date: 03/03/2021 CLINICAL DATA:  Chest pain EXAM: CHEST - 2 VIEW COMPARISON:  October 15, 2020 and September 01, 2020 FINDINGS: There is no edema or airspace opacity. On the lateral view, there is a nodular opacity overlying the heart anteriorly measuring 0.9 x 0.9 cm,  not seen previously. Heart is mildly enlarged, stable, with pulmonary venous hypertension. There is aortic atherosclerosis. There is a loop recorder on the left anteriorly. There is mild degenerative change in the thoracic spine. IMPRESSION: 1. 9 mm nodular opacity overlying the heart  anteriorly, seen only on the lateral view. Given a change from prior studies, noncontrast chest CT to further evaluate advised. 2.  No edema or airspace opacity. 3.  Cardiomegaly with pulmonary vascular congestion again noted. 4.  Aortic Atherosclerosis (ICD10-I70.0). Electronically Signed   By: Lowella Grip III M.D.   On: 03/03/2021 09:44   CT Angio Chest PE W and/or Wo Contrast  Result Date: 03/03/2021 CLINICAL DATA:  Chest pain EXAM: CT ANGIOGRAPHY CHEST WITH CONTRAST TECHNIQUE: Multidetector CT imaging of the chest was performed using the standard protocol during bolus administration of intravenous contrast. Multiplanar CT image reconstructions and MIPs were obtained to evaluate the vascular anatomy. CONTRAST:  41mL OMNIPAQUE IOHEXOL 350 MG/ML SOLN COMPARISON:  CT 10/16/2015, 10/12/2017, chest x-ray 03/03/2021 FINDINGS: Technical note: Examination is degraded by respiratory motion artifact. Cardiovascular: Satisfactory opacification of the pulmonary arteries to the segmental level. No evidence of pulmonary embolism. Respiratory motion artifact degrades evaluation of the segmental and subsegmental branches within the bilateral lung bases. Thoracic aorta is nonaneurysmal. Atherosclerotic calcifications of the aorta and coronary arteries. Heart size is enlarged. No pericardial effusion. Mediastinum/Nodes: No axillary, mediastinal, or hilar lymphadenopathy. Trachea and esophagus within normal limits. Enlarged, heterogeneous thyroid gland. Lungs/Pleura: Small focal airspace opacity within the superior aspect of the right lower lobe (series 10, image 75) measuring 11 x 11 mm. 9 mm noncalcified nodule within the lingula is stable compared to 2016, benign. Mild bibasilar atelectasis. Lungs are otherwise clear. No pleural effusion or pneumothorax. Mild paraseptal emphysema within the upper lobes. Upper Abdomen: Reflux of contrast into the IVC and hepatic veins. No acute findings within the visualized portion of  the upper abdomen. Musculoskeletal: No chest wall abnormality. No acute or significant osseous findings. Review of the MIP images confirms the above findings. IMPRESSION: 1. Negative for pulmonary embolism. 2. Small focal airspace opacity within the superior aspect of the right lower lobe, favored to represent an infectious or inflammatory process. Consider follow-up noncontrast chest CT in 3 months to exclude the presence of an underlying nodule. 3. Cardiomegaly with reflux of contrast into the IVC and hepatic veins suggesting right heart dysfunction. 4. Enlarged, heterogeneous thyroid gland. Recommend nonemergent thyroid ultrasound (ref: J Am Coll Radiol. 2015 Feb;12(2): 143-50). Aortic Atherosclerosis (ICD10-I70.0) and Emphysema (ICD10-J43.9). Electronically Signed   By: Davina Poke D.O.   On: 03/03/2021 11:59    Procedures Procedures   Medications Ordered in ED Medications  nitroGLYCERIN (NITROSTAT) SL tablet 0.4 mg (0.4 mg Sublingual Given 03/03/21 1040)  aspirin tablet 325 mg (325 mg Oral Not Given 03/03/21 1017)  doxycycline (VIBRA-TABS) tablet 100 mg (has no administration in time range)  morphine 4 MG/ML injection 4 mg (4 mg Intravenous Given 03/03/21 1103)  ondansetron (ZOFRAN) injection 4 mg (4 mg Intravenous Given 03/03/21 1103)  iohexol (OMNIPAQUE) 350 MG/ML injection 100 mL (75 mLs Intravenous Contrast Given 03/03/21 1135)  HYDROmorphone (DILAUDID) injection 1 mg (1 mg Intravenous Given 03/03/21 1159)    ED Course  I have reviewed the triage vital signs and the nursing notes.  Pertinent labs & imaging results that were available during my care of the patient were reviewed by me and considered in my medical decision making (see chart for details).    MDM Rules/Calculators/A&P  Cardiac work up is negative.  Story sounds pleuritic in nature.  CTA neg for PE, but does show an area of inflammatory process.  She is not hypoxic.   I will start her on doxycycline.   Outpatient thyroid US ordered for finding of thyromegaly on CT.  TSH sent.  Pt is stable for d/c.  F/u with pcp. Return if worse.  Final Clinical Impression(s) / ED Diagnoses Final diagnoses:  Community acquired pneumonia of right lower lobe of lung  Atypical chest pain  Thyromegaly    Rx / DC Orders ED Discharge Orders         Ordered    doxycycline (VIBRAMYCIN) 100 MG capsule  2 times daily        03/03/21 1431    HYDROcodone-acetaminophen (NORCO/VICODIN) 5-325 MG tablet  Every 4 hours PRN        03/03/21 1431    US THYROID        03/03/21 1434           Isla Pence, MD 03/03/21 1437

## 2021-03-03 NOTE — Telephone Encounter (Signed)
Patient called and said she has had chest pains that started last night and she said that it hurts to breathe. Transferred to [facility].

## 2021-03-03 NOTE — ED Triage Notes (Signed)
Pt presents with c/o chest pain that started last night. Pt reports the pain is in the center of her chest and also in the left side of her neck.

## 2021-03-03 NOTE — Discharge Instructions (Signed)
Your thyroid is a little enlarged, so I ordered an outpatient thyroid ultrasound to evaluate that a little better.

## 2021-03-03 NOTE — ED Notes (Signed)
Pt transported to CT ?

## 2021-03-04 ENCOUNTER — Ambulatory Visit (INDEPENDENT_AMBULATORY_CARE_PROVIDER_SITE_OTHER): Payer: Medicare Other | Admitting: Internal Medicine

## 2021-03-04 ENCOUNTER — Encounter: Payer: Self-pay | Admitting: Internal Medicine

## 2021-03-04 ENCOUNTER — Other Ambulatory Visit: Payer: Self-pay

## 2021-03-04 VITALS — BP 134/70 | HR 78 | Temp 98.8°F | Ht 62.0 in | Wt 117.0 lb

## 2021-03-04 DIAGNOSIS — R911 Solitary pulmonary nodule: Secondary | ICD-10-CM | POA: Insufficient documentation

## 2021-03-04 DIAGNOSIS — E010 Iodine-deficiency related diffuse (endemic) goiter: Secondary | ICD-10-CM | POA: Diagnosis not present

## 2021-03-04 DIAGNOSIS — J189 Pneumonia, unspecified organism: Secondary | ICD-10-CM | POA: Diagnosis not present

## 2021-03-04 NOTE — Progress Notes (Signed)
Subjective:    Patient ID: Elaine Turner, female    DOB: [birth year], 75 y.o.   MRN: [id number]  HPI The patient is here for an acute visit.  Went to the emergency room yesterday for chest pain.  She was experiencing central chest pain with deep breathing.  Work-up revealed right lower lobe pneumonia.  Possible lung nodule.  Thyromegaly.  EKG without acute changes.  Troponin normal.  Blood work remarkable.  She states minimal cough.  Chest pain has improved.  She did take hydrocodone monthly and did have some side effects.  She did not pick up the doxycycline-thought it was for nausea.      Medications and allergies reviewed with patient and updated if appropriate.  Patient Active Problem List   Diagnosis Date Noted  . Aortic atherosclerosis ([place]) 11/12/2020  . Left groin pain 10/15/2020  . B12 deficiency 10/10/2020  . Vitamin D deficiency 10/10/2020  . Fatigue 10/03/2020  . Left low back pain 10/03/2020  . LLQ pain 10/03/2020  . Thrush 09/10/2020  . Anxiety and depression 01/24/2020  . Low back pain 05/31/2019  . Central retinal artery occlusion 05/25/2019  . Ischemic optic neuropathy of left eye 05/24/2019  . Tobacco dependence 05/24/2019  . CKD (chronic kidney disease) stage 3, GFR 30-59 ml/min (HCC) 05/24/2019  . Nasal obstruction 01/06/2019  . Sinus infection 09/16/2018  . Abdominal pain 08/18/2018  . Allergic rhinitis 07/28/2018  . Allergic conjunctivitis 07/28/2018  . Sigmoid diverticulitis 02/15/2018  . Acute respiratory distress 09/30/2017  . AKI (acute kidney injury) ([place])   . COPD with acute exacerbation ([place])   . Cough 08/09/2017  . Weight loss 06/17/2017  . Constipation 06/17/2017  . Skin tag 03/19/2017  . Abnormal breath sounds 03/25/2016  . COPD  GOLD 0 / active smoker  10/24/2015  . Solitary pulmonary nodule 05/31/2015  . Left knee pain 02/09/2014  . Primary localized osteoarthrosis, lower leg 02/09/2014  . Anemia, unspecified 05/15/2013  .  Hypokalemia 05/08/2013  . Hemorrhoids 05/02/2013  . CAP (community acquired pneumonia) 05/01/2013  . Hyponatremia 05/01/2013  . Hyperglycemia 05/01/2013  . Atherosclerosis of native arteries of the extremities with intermittent claudication 10/14/2012  . Osteoarthritis 09/08/2012  . Cigarette smoker 09/08/2012  . Insomnia 09/08/2012  . Hyperlipidemia 09/08/2012  . Heart murmur, aortic 09/08/2012  . Encounter for well adult exam with abnormal findings 09/02/2012  . Hypertension   . PVD (peripheral vascular disease) ([place])     Current Outpatient Medications on File Prior to Visit  Medication Sig Dispense Refill  . albuterol (VENTOLIN HFA) 108 (90 Base) MCG/ACT inhaler Inhale 2 puffs into the lungs every 6 (six) hours as needed for wheezing or shortness of breath. 8 g 2  . aspirin 81 MG EC tablet Take 1 tablet (81 mg total) by mouth daily. Swallow whole. 30 tablet 12  . atorvastatin (LIPITOR) 40 MG tablet TAKE 1 TABLET (40 MG TOTAL) BY MOUTH DAILY AT 6 PM. 90 tablet 3  . benzonatate (TESSALON) 100 MG capsule Take 1 capsule (100 mg total) by mouth every 8 (eight) hours. 21 capsule 0  . bisacodyl (DULCOLAX) 5 MG EC tablet Take 5 mg by mouth daily as needed for moderate constipation. Patient states she takes 6 a day when she is constipated    . clopidogrel (PLAVIX) 75 MG tablet TAKE 1 TABLET BY MOUTH EVERY DAY 90 tablet 3  . doxycycline (VIBRAMYCIN) 100 MG capsule Take 1 capsule (100 mg total) by mouth 2 (two) times  daily. 14 capsule 0  . feeding supplement, ENSURE ENLIVE, (ENSURE ENLIVE) LIQD Take 237 mLs by mouth daily at 3 pm. 237 mL 12  . HYDROcodone-acetaminophen (NORCO/VICODIN) 5-325 MG tablet Take 1 tablet by mouth every 4 (four) hours as needed. 10 tablet 0  . lactulose (CHRONULAC) 10 GM/15ML solution TAKE 30 MLS BY MOUTH 2 TIMES DAILY AS NEEDED FOR MILD CONSTIPATION OR MODERATE CONSTIPATION. 708 mL 1  . losartan (COZAAR) 25 MG tablet Take 25 mg by mouth 2 (two) times daily.    Marland Kitchen  losartan (COZAAR) 50 MG tablet Take 1 tablet (50 mg total) by mouth daily. 90 tablet 3  . meloxicam (MOBIC) 7.5 MG tablet Take by mouth.    Marland Kitchen NIFEdipine (PROCARDIA XL/NIFEDICAL-XL) 90 MG 24 hr tablet TAKE 1 TABLET BY MOUTH EVERY DAY 90 tablet 2  . omeprazole (PRILOSEC) 20 MG capsule Take 1 capsule (20 mg total) by mouth daily. 30 capsule 11  . senna (SENOKOT) 8.6 MG tablet Take 1 tablet (8.6 mg total) by mouth daily. 90 tablet 3  . tiZANidine (ZANAFLEX) 2 MG tablet TAKE 1 TABLET (2 MG TOTAL) BY MOUTH EVERY 6 (SIX) HOURS AS NEEDED FOR MUSCLE SPASMS. 30 tablet 2  . traMADol (ULTRAM) 50 MG tablet Take 50 mg by mouth 3 (three) times daily as needed.    . zolpidem (AMBIEN) 5 MG tablet TAKE 1 TABLET (5 MG TOTAL) BY MOUTH AT BEDTIME AS NEEDED FOR SLEEP. 90 tablet 1  . [DISCONTINUED] citalopram (CELEXA) 10 MG tablet Take 1 tablet (10 mg total) by mouth daily. 90 tablet 3  . [DISCONTINUED] fluticasone (FLONASE) 50 MCG/ACT nasal spray Place 2 sprays into both nostrils daily. (Patient taking differently: Place 2 sprays into both nostrils as needed for rhinitis. ) 16 g 6   No current facility-administered medications on file prior to visit.    Past Medical History:  Diagnosis Date  . 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 Aortic atherosclerosis ([place])   . Chronic diarrhea   . COPD  GOLD 0 / active smoker  10/24/2015  . Diverticulitis   . Diverticulosis   . Esophageal stricture   . Heart murmur   . Hemorrhoids   . Hyperglycemia 05/01/2013  . Hypertension   . 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 Pneumonia 05/01/2013  . PVD (peripheral vascular disease) ([place])   . Urine incontinence     Past Surgical History:  Procedure Laterality Date  . ABDOMINAL HYSTERECTOMY    . CARPAL TUNNEL RELEASE Right 09/2019  . HEMORRHOID SURGERY N/A 06/26/2015   Procedure: INTERNAL AND EXTERNAL HEMORRHOIDECTOMY;  Surgeon: Georganna Skeans, MD;  Location:  [facility];  Service: General;  Laterality: N/A;  . LOOP RECORDER INSERTION N/A 05/25/2019   Procedure: LOOP RECORDER INSERTION;  Surgeon: Constance Haw, MD;  Location: [facility];  Service: Cardiovascular;  Laterality: N/A;    Social History   Socioeconomic History  . Marital status: Married    Spouse name: Not on file  . Number of children: Not on file  . Years of education: 55  . Highest education level: Not on file  Occupational History  . Occupation: retired  Tobacco Use  . Smoking status: Current Every Day Smoker    Packs/day: 1.00    Years: 62.00    Pack years: 62.00    Types: Cigarettes  . Smokeless tobacco: Never Used  Vaping Use  . Vaping Use: Never used  Substance and Sexual Activity  . Alcohol use: Yes    Comment: pt reports drinking 1 pint  per week  . Drug use: No  . Sexual activity: Not Currently  Other Topics Concern  . Not on file  Social History Narrative  . Not on file   Social Determinants of Health   Financial Resource Strain: Not on file  Food Insecurity: Not on file  Transportation Needs: Not on file  Physical Activity: Not on file  Stress: Not on file  Social Connections: Not on file    Family History  Problem Relation Age of Onset  . Hypertension Mother   . Lung cancer Father   . Colon cancer Neg Hx   . Esophageal cancer Neg Hx   . Pancreatic cancer Neg Hx   . Liver disease Neg Hx   . Stroke Neg Hx     Review of Systems  Constitutional: Positive for appetite change, chills and fatigue. Negative for fever.  HENT: Positive for postnasal drip. Negative for congestion, sinus pain and sore throat.   Respiratory: Positive for cough. Negative for shortness of breath and wheezing.   Cardiovascular: Positive for chest pain (with deep breaths). Negative for palpitations.  Neurological: Positive for light-headedness (from hydrocodone). Negative for headaches.       Objective:   Vitals:   03/04/21 1516  BP:  134/70  Pulse: 78  Temp: 98.8 F (37.1 C)  SpO2: 96%   BP Readings from Last 3 Encounters:  03/04/21 134/70  03/03/21 (!) 188/90  11/18/20 134/70   Wt Readings from Last 3 Encounters:  03/04/21 117 lb (53.1 kg)  03/03/21 115 lb (52.2 kg)  11/18/20 117 lb (53.1 kg)   Body mass index is 21.4 kg/m.   Physical Exam    GENERAL APPEARANCE: Appears stated age, well appearing, NAD EYES: conjunctiva clear, no icterus HEENT: bilateral tympanic membranes and ear canals normal, oropharynx with no erythema, no thyromegaly, trachea midline, no cervical or supraclavicular lymphadenopathy LUNGS: Clear to auscultation without wheeze or crackles, unlabored breathing, fair air entry bilaterally-chronic related to smoking Chest: No chest pain with palpation CARDIOVASCULAR: Normal S1,S2 without murmurs, no edema SKIN: Warm, dry      Assessment & Plan:    See Problem List for Assessment and Plan of chronic medical problems.    This visit occurred during the SARS-CoV-2 public health emergency.  Safety protocols were in place, including screening questions prior to the visit, additional usage of staff PPE, and extensive cleaning of exam room while observing appropriate contact time as indicated for disinfecting solutions.

## 2021-03-04 NOTE — Assessment & Plan Note (Signed)
New Possible lung nodule seen on chest x-ray CT showed pneumonia-no obvious nodule CT contrast advised in 3 months to evaluate further This was ordered now to be done in 3 months Stressed smoking cessation

## 2021-03-04 NOTE — Patient Instructions (Addendum)
A thyroid ultrasound was ordered.  They will call you to schedule this.    A Ct scan was ordered - this is to be done in 3 months.  This is to follow up your lung nodule and make sure the pneumonia is gone.    Take the doxycycline as prescribed - this is an antibiotic.    Please call if there is no improvement in your symptoms.

## 2021-03-04 NOTE — Assessment & Plan Note (Signed)
Acute Seen on recent CT chest Recommended thyroid ultrasound-ordered to evaluate further

## 2021-03-04 NOTE — Assessment & Plan Note (Signed)
Acute Diagnosed yesterday in the emergency room, chest x-ray She has not started the doxycycline-a cough which is helping start this and complete the entire course to treat pneumonia Coughing is minimal-no additional medication needed Chest pain is improved-continue Tylenol as needed-stop hydrocodone given side effects

## 2021-03-12 ENCOUNTER — Ambulatory Visit: Payer: Medicare Other | Admitting: Internal Medicine

## 2021-03-17 ENCOUNTER — Ambulatory Visit: Payer: Medicare Other | Admitting: Internal Medicine

## 2021-03-18 ENCOUNTER — Other Ambulatory Visit: Payer: Self-pay | Admitting: Internal Medicine

## 2021-03-18 ENCOUNTER — Ambulatory Visit (INDEPENDENT_AMBULATORY_CARE_PROVIDER_SITE_OTHER): Payer: Medicare Other | Admitting: Internal Medicine

## 2021-03-18 ENCOUNTER — Ambulatory Visit (INDEPENDENT_AMBULATORY_CARE_PROVIDER_SITE_OTHER): Payer: Medicare Other

## 2021-03-18 ENCOUNTER — Encounter: Payer: Self-pay | Admitting: Internal Medicine

## 2021-03-18 ENCOUNTER — Other Ambulatory Visit: Payer: Self-pay

## 2021-03-18 VITALS — BP 138/84 | HR 79 | Temp 98.2°F | Ht 62.0 in | Wt 118.0 lb

## 2021-03-18 DIAGNOSIS — R079 Chest pain, unspecified: Secondary | ICD-10-CM | POA: Diagnosis not present

## 2021-03-18 DIAGNOSIS — R071 Chest pain on breathing: Secondary | ICD-10-CM

## 2021-03-18 DIAGNOSIS — J309 Allergic rhinitis, unspecified: Secondary | ICD-10-CM

## 2021-03-18 DIAGNOSIS — J189 Pneumonia, unspecified organism: Secondary | ICD-10-CM

## 2021-03-18 DIAGNOSIS — R911 Solitary pulmonary nodule: Secondary | ICD-10-CM

## 2021-03-18 DIAGNOSIS — E010 Iodine-deficiency related diffuse (endemic) goiter: Secondary | ICD-10-CM

## 2021-03-18 DIAGNOSIS — J811 Chronic pulmonary edema: Secondary | ICD-10-CM | POA: Diagnosis not present

## 2021-03-18 DIAGNOSIS — I10 Essential (primary) hypertension: Secondary | ICD-10-CM | POA: Diagnosis not present

## 2021-03-18 DIAGNOSIS — J449 Chronic obstructive pulmonary disease, unspecified: Secondary | ICD-10-CM

## 2021-03-18 IMAGING — DX DG CHEST 2V
2 series · 2 of 2 positions shown · non-contrast
Comparison: [DATE] of the

CLINICAL DATA: RIGHT-side pneumonia, chest pain, shortness of
breath for 1 week, hypertension, smoker, history pneumonia

EXAM:
CHEST - 2 VIEW

[chest pa]
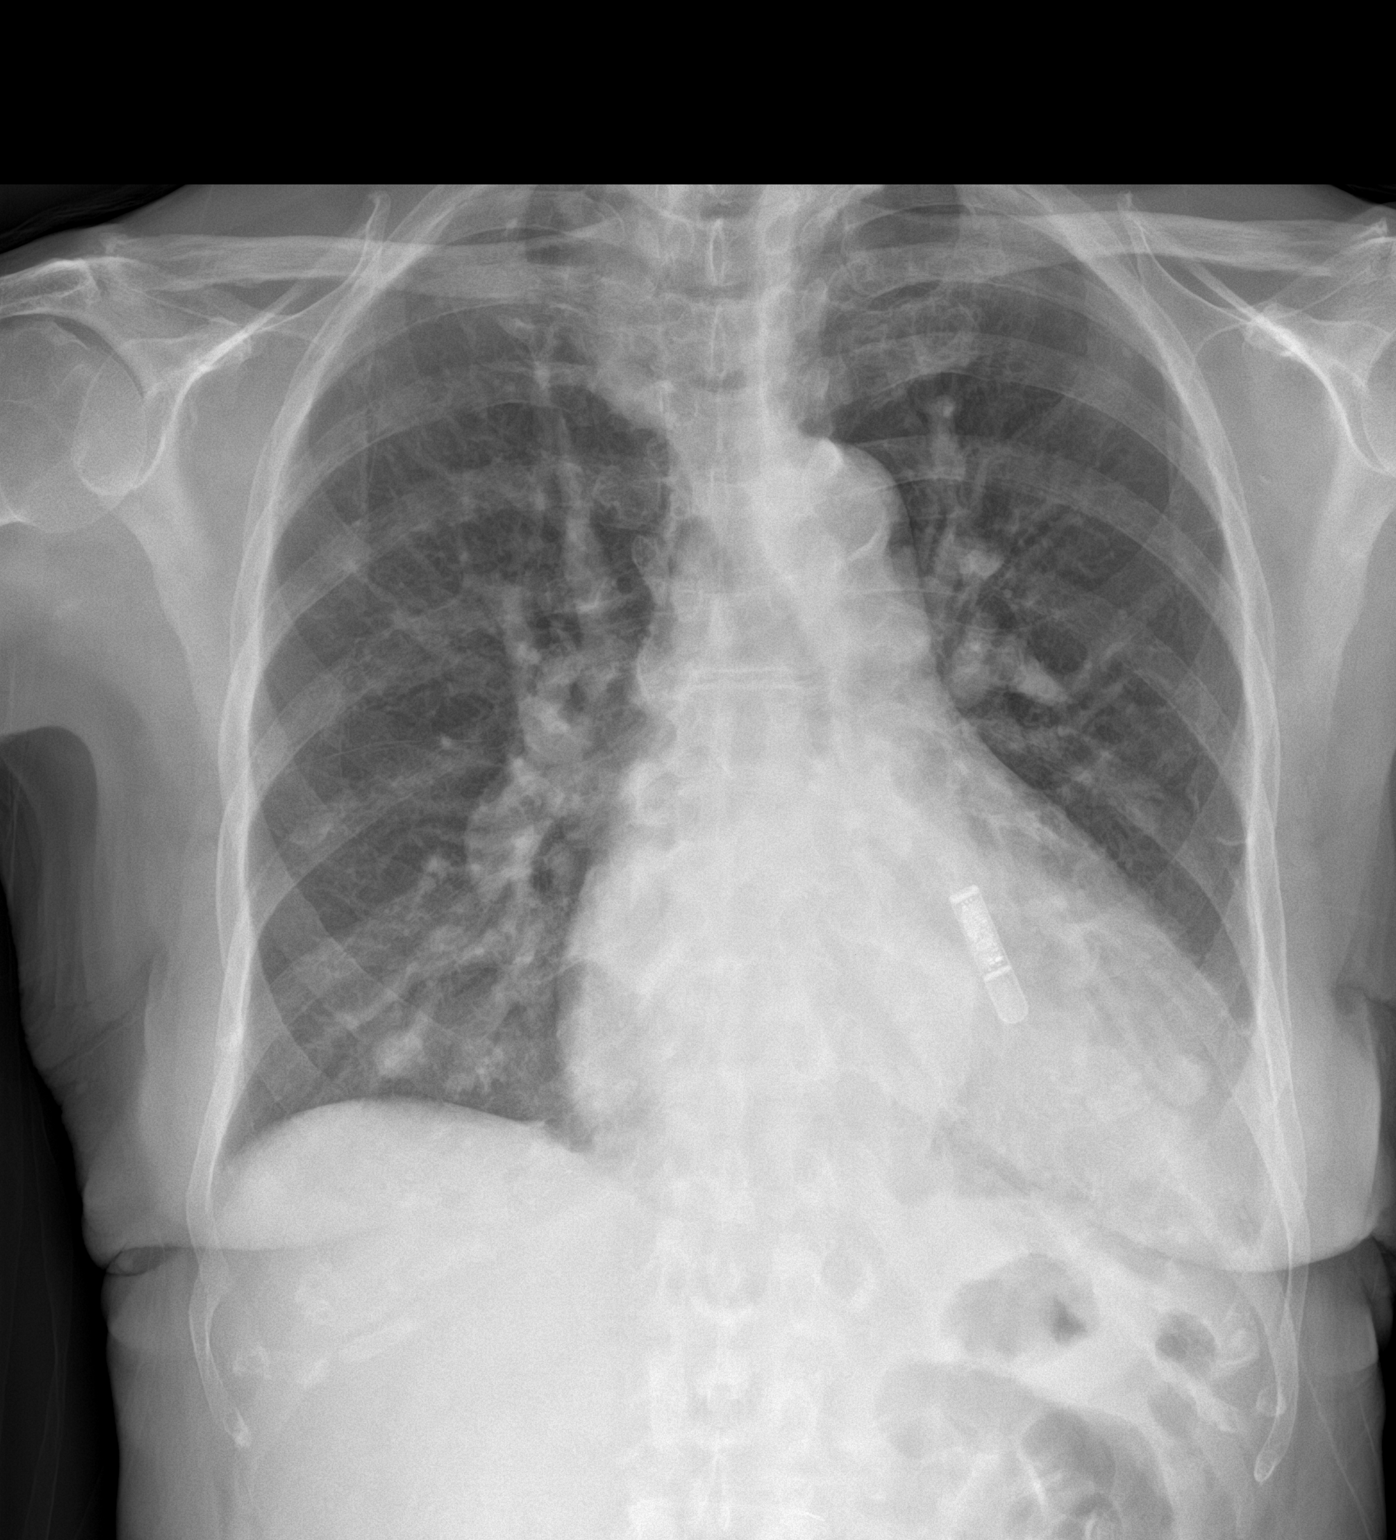

[chest lat]
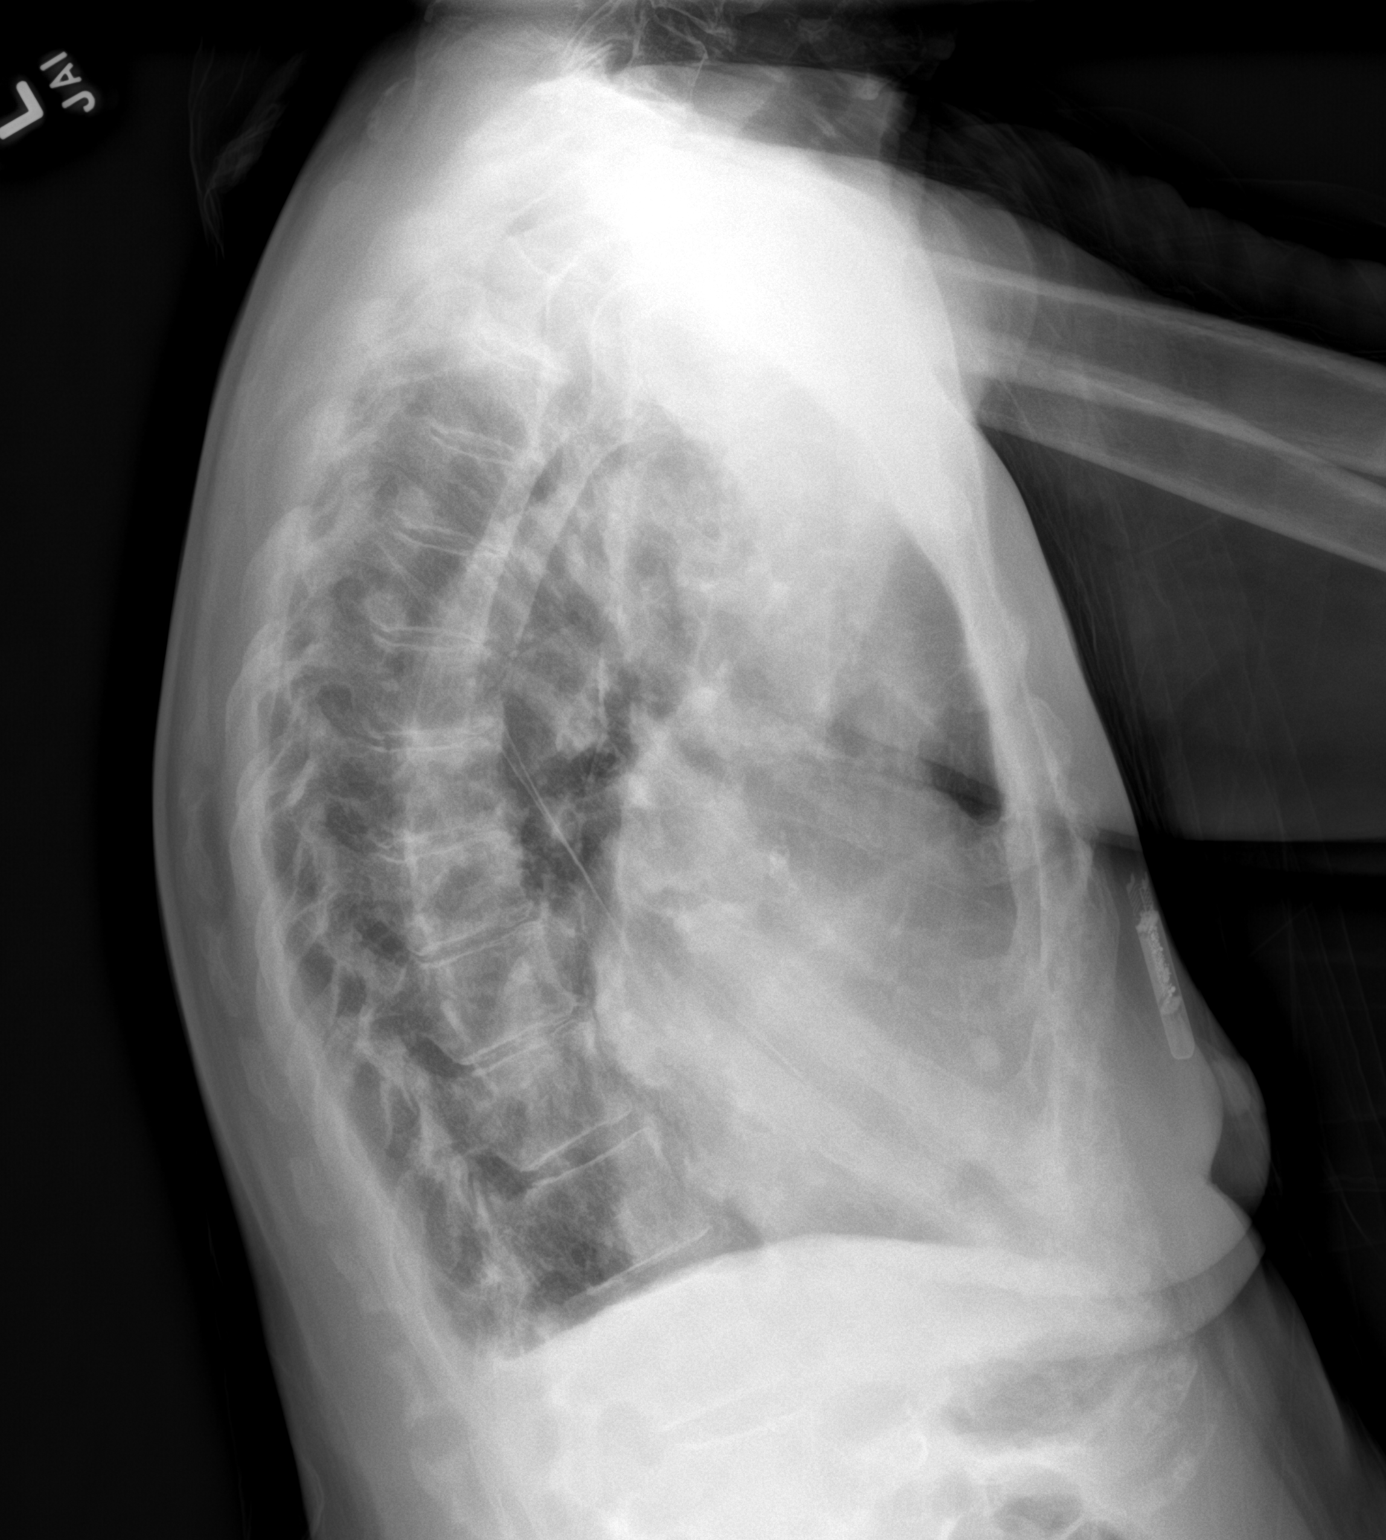

[2 of 2 positions shown; findings below may reference images not displayed]

FINDINGS: Loop recorder projects over chest.

Enlargement of cardiac silhouette with pulmonary vascular
congestion.

Atherosclerotic calcification aorta.

BILATERAL nipple shadows, seen on lateral view as well.

No acute infiltrate, pleural effusion, or pneumothorax.

Osseous structures unremarkable.
IMPRESSION: Enlargement of cardiac silhouette with pulmonary vascular
congestion.

No acute abnormalities.

Aortic Atherosclerosis ([5F]-[5F]).

## 2021-03-18 NOTE — Patient Instructions (Addendum)
We can hold on further antibiotics for now pending the chest xray  Please take all new medication as recommended for allergies - allegra and nasacort OTC  Please continue all other medications as before  Please have the pharmacy call with any other refills you may need.  Please continue your efforts at being more active, low cholesterol diet, and weight control.  You are otherwise up to date with prevention measures today.  Please keep your appointments with your specialists as you may have planned  You will be contacted regarding the referral for: thyroid ultrasound  Please go to the XRAY Department in the first floor for the x-ray testing  You will be contacted by phone if any changes need to be made immediately.  Otherwise, you will receive a letter about your results with an explanation, but please check with MyChart first.  Please remember to sign up for MyChart if you have not done so, as this will be important to you in the future with finding out test results, communicating by private email, and scheduling acute appointments online when needed.  Please make an Appointment to return in 3 months to consider repeat CT scan

## 2021-03-18 NOTE — Telephone Encounter (Signed)
Please refill as per office routine med refill policy (all routine meds refilled for 3 mo or monthly per pt preference up to one year from last visit, then month to month grace period for 3 mo, then further med refills will have to be denied)  

## 2021-03-18 NOTE — Progress Notes (Signed)
Patient ID: Elaine Turner, female   DOB: 05-15-1946, 75 y.o.   MRN: [id number]        Chief Complaint: follow up pneumonia, CP, TMG, and allergies       HPI:  Elaine Turner is a 75 y.o. female here with above, has no fever , chills but still feels "cold all over" and Does have also several wks ongoing nasal allergy symptoms with clearish congestion, itch and sneezing, without fever, pain, ST, cough, swelling or wheezing.  Does also have fleeting random recurring sharp CPs anteriorly, left > right, not assoc with sob, diaphoresis, n/v, palp or dizziness.  Had recent CTA chest Mar 03, 2021 with  IMPRESSION: 1. Negative for pulmonary embolism. 2. Small focal airspace opacity within the superior aspect of the right lower lobe, favored to represent an infectious or inflammatory process. Consider follow-up noncontrast chest CT in 3 months to exclude the presence of an underlying nodule. 3. Cardiomegaly with reflux of contrast into the IVC and hepatic veins suggesting right heart dysfunction. 4. Enlarged, heterogeneous thyroid gland. Recommend nonemergent thyroid ultrasound (ref: J Am Coll Radiol. 2015 Feb;12(2): 143-50). Aortic Atherosclerosis (ICD10-I70.0) and Emphysema (ICD10-J43.9).  Denies hyper or hypo thyroid symptoms such as voice, skin or hair change.  Completed the doxycycline 14 days Wt Readings from Last 3 Encounters:  03/18/21 118 lb (53.5 kg)  03/04/21 117 lb (53.1 kg)  03/03/21 115 lb (52.2 kg)   BP Readings from Last 3 Encounters:  03/18/21 138/84  03/04/21 134/70  03/03/21 (!) 188/90         Past Medical History:  Diagnosis Date  . 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 Aortic atherosclerosis ([place])   . Chronic diarrhea   . COPD  GOLD 0 / active smoker  10/24/2015  . Diverticulitis   . Diverticulosis   . Esophageal stricture   . Heart murmur   . Hemorrhoids   . Hyperglycemia 05/01/2013  . Hypertension   .  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 Pneumonia 05/01/2013  . PVD (peripheral vascular disease) ([place])   . Urine incontinence    Past Surgical History:  Procedure Laterality Date  . ABDOMINAL HYSTERECTOMY    . CARPAL TUNNEL RELEASE Right 09/2019  . HEMORRHOID SURGERY N/A 06/26/2015   Procedure: INTERNAL AND EXTERNAL HEMORRHOIDECTOMY;  Surgeon: Georganna Skeans, MD;  Location: [facility];  Service: General;  Laterality: N/A;  . LOOP RECORDER INSERTION N/A 05/25/2019   Procedure: LOOP RECORDER INSERTION;  Surgeon: Constance Haw, MD;  Location: [facility];  Service: Cardiovascular;  Laterality: N/A;    reports that she has been smoking cigarettes. She has a 62.00 pack-year smoking history. She has never used smokeless tobacco. She reports current alcohol use. She reports that she does not use drugs. family history includes Hypertension in her mother; Lung cancer in her father. Allergies  Allergen Reactions  . Ace Inhibitors Cough  . Levaquin [Levofloxacin] Other (See Comments)    GI upset   Current Outpatient Medications on File Prior to Visit  Medication Sig Dispense Refill  . albuterol (VENTOLIN HFA) 108 (90 Base) MCG/ACT inhaler Inhale 2 puffs into the lungs every 6 (six) hours as needed for wheezing or shortness of breath. 8 g 2  . aspirin 81 MG EC tablet Take 1 tablet (81 mg total) by mouth daily. Swallow whole. 30 tablet 12  . atorvastatin (LIPITOR) 40 MG tablet TAKE 1 TABLET (  40 MG TOTAL) BY MOUTH DAILY AT 6 PM. 90 tablet 3  . benzonatate (TESSALON) 100 MG capsule Take 1 capsule (100 mg total) by mouth every 8 (eight) hours. 21 capsule 0  . bisacodyl (DULCOLAX) 5 MG EC tablet Take 5 mg by mouth daily as needed for moderate constipation. Patient states she takes 6 a day when she is constipated    . clopidogrel (PLAVIX) 75 MG tablet TAKE 1 TABLET BY MOUTH EVERY DAY 90 tablet 3  . doxycycline (VIBRAMYCIN) 100 MG capsule Take 1 capsule (100 mg total)  by mouth 2 (two) times daily. 14 capsule 0  . feeding supplement, ENSURE ENLIVE, (ENSURE ENLIVE) LIQD Take 237 mLs by mouth daily at 3 pm. 237 mL 12  . lactulose (CHRONULAC) 10 GM/15ML solution TAKE 30 MLS BY MOUTH 2 TIMES DAILY AS NEEDED FOR MILD CONSTIPATION OR MODERATE CONSTIPATION. 708 mL 1  . losartan (COZAAR) 25 MG tablet Take 25 mg by mouth 2 (two) times daily.    Marland Kitchen losartan (COZAAR) 50 MG tablet Take 1 tablet (50 mg total) by mouth daily. 90 tablet 3  . meloxicam (MOBIC) 7.5 MG tablet Take by mouth.    Marland Kitchen omeprazole (PRILOSEC) 20 MG capsule Take 1 capsule (20 mg total) by mouth daily. 30 capsule 11  . senna (SENOKOT) 8.6 MG tablet Take 1 tablet (8.6 mg total) by mouth daily. 90 tablet 3  . tiZANidine (ZANAFLEX) 2 MG tablet TAKE 1 TABLET (2 MG TOTAL) BY MOUTH EVERY 6 (SIX) HOURS AS NEEDED FOR MUSCLE SPASMS. 30 tablet 2  . traMADol (ULTRAM) 50 MG tablet Take 50 mg by mouth 3 (three) times daily as needed.    . zolpidem (AMBIEN) 5 MG tablet TAKE 1 TABLET (5 MG TOTAL) BY MOUTH AT BEDTIME AS NEEDED FOR SLEEP. 90 tablet 1  . [DISCONTINUED] citalopram (CELEXA) 10 MG tablet Take 1 tablet (10 mg total) by mouth daily. 90 tablet 3  . [DISCONTINUED] fluticasone (FLONASE) 50 MCG/ACT nasal spray Place 2 sprays into both nostrils daily. (Patient taking differently: Place 2 sprays into both nostrils as needed for rhinitis. ) 16 g 6   No current facility-administered medications on file prior to visit.        ROS:  All others reviewed and negative.  Objective        PE:  BP 138/84   Pulse 79   Temp 98.2 F (36.8 C) (Oral)   Ht 5\' 2"  (1.575 m)   Wt 118 lb (53.5 kg)   SpO2 97%   BMI 21.58 kg/m                 Constitutional: Pt appears in NAD               HENT: Head: NCAT.                Right Ear: External ear normal.                 Left Ear: External ear normal.                Eyes: . Pupils are equal, round, and reactive to light. Conjunctivae and EOM are normal               Nose:  without d/c or deformity               Neck: Neck supple. Gross normal ROM  Cardiovascular: Normal rate and regular rhythm.                 Pulmonary/Chest: Effort normal and breath sounds without rales or wheezing.                Abd:  Soft, NT, ND, + BS, no organomegaly               Neurological: Pt is alert. At baseline orientation, motor grossly intact               Skin: Skin is warm. No rashes, no other new lesions, LE edema - none               Psychiatric: Pt behavior is normal without agitation   Micro: none  Cardiac tracings I have personally interpreted today:  none  Pertinent Radiological findings (summarize): CTA chest as above   Lab Results  Component Value Date   WBC 17.2 (H) 03/03/2021   HGB 10.6 (L) 03/03/2021   HCT 34.6 (L) 03/03/2021   PLT 454 (H) 03/03/2021   GLUCOSE 110 (H) 03/03/2021   CHOL 161 10/15/2020   TRIG 74.0 10/15/2020   HDL 74.30 10/15/2020   LDLCALC 72 10/15/2020   ALT 10 10/15/2020   AST 15 10/15/2020   NA 143 03/03/2021   K 4.0 03/03/2021   CL 109 03/03/2021   CREATININE 1.39 (H) 03/03/2021   BUN 23 03/03/2021   CO2 22 03/03/2021   TSH 0.569 03/03/2021   INR 1.1 05/24/2019   HGBA1C 6.0 10/15/2020   Assessment/Plan:  Elaine Turner is a 75 y.o. Black or African American [2] female with  has a past medical history of AIN III (anal intraepithelial neoplasia III) (06/26/2015), Aortic atherosclerosis ([place]), Chronic diarrhea, COPD  GOLD 0 / active smoker  (10/24/2015), Diverticulitis, Diverticulosis, Esophageal stricture, Heart murmur, Hemorrhoids, Hyperglycemia (05/01/2013), Hypertension, Ischemic optic neuropathy, left eye (05/25/2019), Pneumonia (05/01/2013), PVD (peripheral vascular disease) ([place]), and Urine incontinence.  COPD  GOLD 0 / active smoker  Stable, cont current med tx,  to f/u any worsening symptoms or concerns  Community acquired pneumonia of right lower lobe of lung Clinically resolved, pt reassured,  to f/u any  worsening symptoms or concerns  Lung nodule Pt to f/u 3 mo for f/u CT  Thyromegaly Asympt, also for thyroid u/s, Lab Results  Component Value Date   TSH 0.569 03/03/2021     Allergic rhinitis Mild to mod, for allegra ad nasaocrt asd,  to f/u any worsening symptoms or concerns  Followup: Return in about 3 months (around 06/18/2021).  Cathlean Cower, MD 03/25/2021 10:10 PM [facility]

## 2021-03-19 ENCOUNTER — Encounter: Payer: Self-pay | Admitting: Internal Medicine

## 2021-03-19 ENCOUNTER — Institutional Professional Consult (permissible substitution): Payer: Medicare Other | Admitting: Neurology

## 2021-03-23 LAB — CUP PACEART REMOTE DEVICE CHECK
Date Time Interrogation Session: 20220319043002
Implantable Pulse Generator Implant Date: 20200528

## 2021-03-24 ENCOUNTER — Ambulatory Visit (INDEPENDENT_AMBULATORY_CARE_PROVIDER_SITE_OTHER): Payer: Medicare Other

## 2021-03-24 DIAGNOSIS — I639 Cerebral infarction, unspecified: Secondary | ICD-10-CM

## 2021-03-25 ENCOUNTER — Encounter: Payer: Self-pay | Admitting: Internal Medicine

## 2021-03-25 ENCOUNTER — Telehealth: Payer: Self-pay | Admitting: Internal Medicine

## 2021-03-25 NOTE — Assessment & Plan Note (Signed)
Stable, cont current med tx,  to f/u any worsening symptoms or concerns

## 2021-03-25 NOTE — Assessment & Plan Note (Signed)
Pt to f/u 3 mo for f/u CT

## 2021-03-25 NOTE — Assessment & Plan Note (Signed)
Mild to mod, for allegra ad nasaocrt asd,  to f/u any worsening symptoms or concerns

## 2021-03-25 NOTE — Telephone Encounter (Signed)
Patient called and said she is still having chills. She was wondering if something could be prescribed. She said it can be sent to [id number] - [place], [address]. Please advise

## 2021-03-25 NOTE — Assessment & Plan Note (Signed)
Clinically resolved, pt reassured,  to f/u any worsening symptoms or concerns

## 2021-03-25 NOTE — Assessment & Plan Note (Signed)
Asympt, also for thyroid u/s, Lab Results  Component Value Date   TSH 0.569 03/03/2021

## 2021-03-25 NOTE — Telephone Encounter (Signed)
Pt would need OV if she thinks something like an antibiotic might be needed, any provider ok

## 2021-03-26 ENCOUNTER — Other Ambulatory Visit: Payer: Self-pay | Admitting: Internal Medicine

## 2021-03-26 NOTE — Telephone Encounter (Signed)
Patient phone never stopped ringing to leave a message

## 2021-03-26 NOTE — Telephone Encounter (Signed)
Please refill as per office routine med refill policy (all routine meds refilled for 3 mo or monthly per pt preference up to one year from last visit, then month to month grace period for 3 mo, then further med refills will have to be denied)  

## 2021-03-27 ENCOUNTER — Telehealth: Payer: Self-pay | Admitting: Emergency Medicine

## 2021-03-27 NOTE — Telephone Encounter (Signed)
Pt appt scheduled per Dr Jenny Reichmann

## 2021-03-27 NOTE — Telephone Encounter (Signed)
[facility] Alert received on 03/27/2021 for patient showing PVC's and PAC's. One new questionable AF episode that lasted 12 minutes in duration. No OAC listed for patient. Contacted patient by phone for assessment. Patient states she was diagnosed for pneumonia. Patient denies any chest pain or any other symptoms. Routing to Dr. Curt Bears for further review.

## 2021-03-28 ENCOUNTER — Ambulatory Visit (INDEPENDENT_AMBULATORY_CARE_PROVIDER_SITE_OTHER): Payer: Medicare Other | Admitting: Internal Medicine

## 2021-03-28 ENCOUNTER — Other Ambulatory Visit: Payer: Self-pay

## 2021-03-28 ENCOUNTER — Encounter: Payer: Self-pay | Admitting: Internal Medicine

## 2021-03-28 VITALS — BP 122/76 | HR 94 | Temp 98.3°F | Ht 62.0 in | Wt 118.0 lb

## 2021-03-28 DIAGNOSIS — Z0000 Encounter for general adult medical examination without abnormal findings: Secondary | ICD-10-CM | POA: Diagnosis not present

## 2021-03-28 DIAGNOSIS — I700 Atherosclerosis of aorta: Secondary | ICD-10-CM | POA: Diagnosis not present

## 2021-03-28 DIAGNOSIS — R1310 Dysphagia, unspecified: Secondary | ICD-10-CM | POA: Insufficient documentation

## 2021-03-28 DIAGNOSIS — R739 Hyperglycemia, unspecified: Secondary | ICD-10-CM | POA: Diagnosis not present

## 2021-03-28 DIAGNOSIS — R634 Abnormal weight loss: Secondary | ICD-10-CM

## 2021-03-28 DIAGNOSIS — J449 Chronic obstructive pulmonary disease, unspecified: Secondary | ICD-10-CM

## 2021-03-28 DIAGNOSIS — D649 Anemia, unspecified: Secondary | ICD-10-CM | POA: Diagnosis not present

## 2021-03-28 DIAGNOSIS — I10 Essential (primary) hypertension: Secondary | ICD-10-CM

## 2021-03-28 DIAGNOSIS — N1832 Chronic kidney disease, stage 3b: Secondary | ICD-10-CM

## 2021-03-28 DIAGNOSIS — E785 Hyperlipidemia, unspecified: Secondary | ICD-10-CM

## 2021-03-28 DIAGNOSIS — F17200 Nicotine dependence, unspecified, uncomplicated: Secondary | ICD-10-CM | POA: Diagnosis not present

## 2021-03-28 DIAGNOSIS — E538 Deficiency of other specified B group vitamins: Secondary | ICD-10-CM | POA: Diagnosis not present

## 2021-03-28 DIAGNOSIS — Z0001 Encounter for general adult medical examination with abnormal findings: Secondary | ICD-10-CM

## 2021-03-28 DIAGNOSIS — E559 Vitamin D deficiency, unspecified: Secondary | ICD-10-CM

## 2021-03-28 LAB — BASIC METABOLIC PANEL
BUN: 20 mg/dL (ref 6–23)
CO2: 24 mEq/L (ref 19–32)
Calcium: 9.2 mg/dL (ref 8.4–10.5)
Chloride: 97 mEq/L (ref 96–112)
Creatinine, Ser: 1.37 mg/dL — ABNORMAL HIGH (ref 0.40–1.20)
GFR: 37.35 mL/min — ABNORMAL LOW (ref >60.00)
Glucose, Bld: 89 mg/dL (ref 70–99)
Potassium: 3.5 mEq/L (ref 3.5–5.1)
Sodium: 134 mEq/L — ABNORMAL LOW (ref 135–145)

## 2021-03-28 LAB — URINALYSIS, ROUTINE W REFLEX MICROSCOPIC
Hgb urine dipstick: NEGATIVE
Ketones, ur: NEGATIVE
Leukocytes,Ua: NEGATIVE
Nitrite: NEGATIVE
RBC / HPF: NONE SEEN (ref 0–?)
Specific Gravity, Urine: 1.025 (ref 1.000–1.030)
Total Protein, Urine: 100 — AB
Urine Glucose: NEGATIVE
Urobilinogen, UA: 0.2 (ref 0.0–1.0)
WBC, UA: NONE SEEN (ref 0–?)
pH: 5.5 (ref 5.0–8.0)

## 2021-03-28 LAB — CBC WITH DIFFERENTIAL/PLATELET
Basophils Absolute: 0.1 10*3/uL (ref 0.0–0.1)
Basophils Relative: 0.5 % (ref 0.0–3.0)
Eosinophils Absolute: 0.1 10*3/uL (ref 0.0–0.7)
Eosinophils Relative: 0.6 % (ref 0.0–5.0)
HCT: 27.1 % — ABNORMAL LOW (ref 36.0–46.0)
Hemoglobin: 8.6 g/dL — ABNORMAL LOW (ref 12.0–15.0)
Lymphocytes Relative: 8.8 % — ABNORMAL LOW (ref 12.0–46.0)
Lymphs Abs: 1.3 10*3/uL (ref 0.7–4.0)
MCHC: 31.7 g/dL (ref 30.0–36.0)
MCV: 72.3 fl — ABNORMAL LOW (ref 78.0–100.0)
Monocytes Absolute: 0.8 10*3/uL (ref 0.1–1.0)
Monocytes Relative: 5.6 % (ref 3.0–12.0)
Neutro Abs: 12.8 10*3/uL — ABNORMAL HIGH (ref 1.4–7.7)
Neutrophils Relative %: 84.5 % — ABNORMAL HIGH (ref 43.0–77.0)
Platelets: 899 10*3/uL — ABNORMAL HIGH (ref 150.0–400.0)
RBC: 3.75 Mil/uL — ABNORMAL LOW (ref 3.87–5.11)
RDW: 17.3 % — ABNORMAL HIGH (ref 11.5–15.5)
WBC: 15.2 10*3/uL — ABNORMAL HIGH (ref 4.0–10.5)

## 2021-03-28 LAB — HEPATIC FUNCTION PANEL
ALT: 20 U/L (ref 0–35)
AST: 19 U/L (ref 0–37)
Albumin: 3.4 g/dL — ABNORMAL LOW (ref 3.5–5.2)
Alkaline Phosphatase: 131 U/L — ABNORMAL HIGH (ref 39–117)
Bilirubin, Direct: 0.2 mg/dL (ref 0.0–0.3)
Total Bilirubin: 0.4 mg/dL (ref 0.2–1.2)
Total Protein: 7.1 g/dL (ref 6.0–8.3)

## 2021-03-28 LAB — LIPID PANEL
Cholesterol: 143 mg/dL (ref 0–200)
HDL: 44.9 mg/dL (ref >39.00)
LDL Cholesterol: 82 mg/dL (ref 0–99)
NonHDL: 98.09
Total CHOL/HDL Ratio: 3
Triglycerides: 81 mg/dL (ref 0.0–149.0)
VLDL: 16.2 mg/dL (ref 0.0–40.0)

## 2021-03-28 LAB — IBC PANEL
Iron: 8 ug/dL — ABNORMAL LOW (ref 42–145)
Saturation Ratios: 3 % — ABNORMAL LOW (ref 20.0–50.0)
Transferrin: 188 mg/dL — ABNORMAL LOW (ref 212.0–360.0)

## 2021-03-28 LAB — VITAMIN B12: Vitamin B-12: 270 pg/mL (ref 211–911)

## 2021-03-28 LAB — SEDIMENTATION RATE: Sed Rate: 130 mm/hr — ABNORMAL HIGH (ref 0–30)

## 2021-03-28 LAB — VITAMIN D 25 HYDROXY (VIT D DEFICIENCY, FRACTURES): VITD: 9.74 ng/mL — ABNORMAL LOW (ref 30.00–100.00)

## 2021-03-28 LAB — HEMOGLOBIN A1C: Hgb A1c MFr Bld: 6.2 % (ref 4.6–6.5)

## 2021-03-28 LAB — TSH: TSH: 0.38 u[IU]/mL (ref 0.35–4.50)

## 2021-03-28 NOTE — Patient Instructions (Signed)
Please continue all other medications as before, and refills have been done if requested.  Please have the pharmacy call with any other refills you may need.  Please continue your efforts at being more active, low cholesterol diet, and weight control.  You are otherwise up to date with prevention measures today.  Please keep your appointments with your specialists as you may have planned  You will be contacted regarding the referral for: Gastroenterology, and Nephrology (kidney doctor)  Please go to the LAB at the blood drawing area for the tests to be done  You will be contacted by phone if any changes need to be made immediately.  Otherwise, you will receive a letter about your results with an explanation, but please check with MyChart first.  Please remember to sign up for MyChart if you have not done so, as this will be important to you in the future with finding out test results, communicating by private email, and scheduling acute appointments online when needed.  Please make an Appointment to return in 3 months, or sooner if needed

## 2021-03-28 NOTE — Progress Notes (Deleted)
Patient ID: Elaine Turner, female   DOB: 10-03-1946, 75 y.o.   MRN: [id number]        Chief Complaint: follow up HTN, HLD and hyperglycemia ***       HPI:  Elaine Turner is a 75 y.o. female here with c/o          Peak wt in the past has been 179 at home several yrs ao, now done to 80.  Denies worsening depressive symptoms, suicidal ideation, or panic; Wt Readings from Last 3 Encounters:  03/28/21 118 lb (53.5 kg)  03/18/21 118 lb (53.5 kg)  03/04/21 117 lb (53.1 kg)   BP Readings from Last 3 Encounters:  03/28/21 122/76  03/18/21 138/84  03/04/21 134/70         Past Medical History:  Diagnosis Date  . 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 Aortic atherosclerosis ([place])   . Chronic diarrhea   . COPD  GOLD 0 / active smoker  10/24/2015  . Diverticulitis   . Diverticulosis   . Esophageal stricture   . Heart murmur   . Hemorrhoids   . Hyperglycemia 05/01/2013  . Hypertension   . 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 Pneumonia 05/01/2013  . PVD (peripheral vascular disease) ([place])   . Urine incontinence    Past Surgical History:  Procedure Laterality Date  . ABDOMINAL HYSTERECTOMY    . CARPAL TUNNEL RELEASE Right 09/2019  . HEMORRHOID SURGERY N/A 06/26/2015   Procedure: INTERNAL AND EXTERNAL HEMORRHOIDECTOMY;  Surgeon: Georganna Skeans, MD;  Location: [facility];  Service: General;  Laterality: N/A;  . LOOP RECORDER INSERTION N/A 05/25/2019   Procedure: LOOP RECORDER INSERTION;  Surgeon: Constance Haw, MD;  Location: [facility];  Service: Cardiovascular;  Laterality: N/A;    reports that she has been smoking cigarettes. She has a 62.00 pack-year smoking history. She has never used smokeless tobacco. She reports current alcohol use. She reports that she does not use drugs. family history includes Hypertension in her mother; Lung cancer in her father. Allergies  Allergen Reactions   . Ace Inhibitors Cough  . Levaquin [Levofloxacin] Other (See Comments)    GI upset   Current Outpatient Medications on File Prior to Visit  Medication Sig Dispense Refill  . albuterol (VENTOLIN HFA) 108 (90 Base) MCG/ACT inhaler Inhale 2 puffs into the lungs every 6 (six) hours as needed for wheezing or shortness of breath. 8 g 2  . aspirin 81 MG EC tablet Take 1 tablet (81 mg total) by mouth daily. Swallow whole. 30 tablet 12  . atorvastatin (LIPITOR) 40 MG tablet TAKE 1 TABLET (40 MG TOTAL) BY MOUTH DAILY AT 6 PM. 90 tablet 3  . benzonatate (TESSALON) 100 MG capsule Take 1 capsule (100 mg total) by mouth every 8 (eight) hours. 21 capsule 0  . bisacodyl (DULCOLAX) 5 MG EC tablet Take 5 mg by mouth daily as needed for moderate constipation. Patient states she takes 6 a day when she is constipated    . clopidogrel (PLAVIX) 75 MG tablet TAKE 1 TABLET BY MOUTH EVERY DAY 90 tablet 3  . doxycycline (VIBRAMYCIN) 100 MG capsule Take 1 capsule (100 mg total) by mouth 2 (two) times daily. 14 capsule 0  . feeding supplement, ENSURE ENLIVE, (ENSURE ENLIVE) LIQD Take 237 mLs by mouth daily at 3 pm. 237 mL 12  . lactulose (CHRONULAC) 10 GM/15ML solution TAKE 30 MLS  BY MOUTH 2 TIMES DAILY AS NEEDED FOR MILD CONSTIPATION OR MODERATE CONSTIPATION. 708 mL 1  . losartan (COZAAR) 25 MG tablet TAKE 1 TABLET BY MOUTH TWICE A DAY 180 tablet 0  . losartan (COZAAR) 50 MG tablet Take 1 tablet (50 mg total) by mouth daily. 90 tablet 3  . meloxicam (MOBIC) 7.5 MG tablet Take by mouth.    Marland Kitchen NIFEdipine (PROCARDIA XL/NIFEDICAL-XL) 90 MG 24 hr tablet TAKE 1 TABLET BY MOUTH EVERY DAY 90 tablet 2  . omeprazole (PRILOSEC) 20 MG capsule Take 1 capsule (20 mg total) by mouth daily. 30 capsule 11  . senna (SENOKOT) 8.6 MG tablet Take 1 tablet (8.6 mg total) by mouth daily. 90 tablet 3  . tiZANidine (ZANAFLEX) 2 MG tablet TAKE 1 TABLET (2 MG TOTAL) BY MOUTH EVERY 6 (SIX) HOURS AS NEEDED FOR MUSCLE SPASMS. 30 tablet 2  . traMADol  (ULTRAM) 50 MG tablet Take 50 mg by mouth 3 (three) times daily as needed.    . zolpidem (AMBIEN) 5 MG tablet TAKE 1 TABLET (5 MG TOTAL) BY MOUTH AT BEDTIME AS NEEDED FOR SLEEP. 90 tablet 1  . [DISCONTINUED] citalopram (CELEXA) 10 MG tablet Take 1 tablet (10 mg total) by mouth daily. 90 tablet 3  . [DISCONTINUED] fluticasone (FLONASE) 50 MCG/ACT nasal spray Place 2 sprays into both nostrils daily. (Patient taking differently: Place 2 sprays into both nostrils as needed for rhinitis. ) 16 g 6   No current facility-administered medications on file prior to visit.        ROS:  All others reviewed and negative.  Objective        PE:  BP 122/76 (BP Location: Left Arm, Patient Position: Sitting, Cuff Size: Large)   Pulse 94   Temp 98.3 F (36.8 C) (Oral)   Ht 5\' 2"  (1.575 m)   Wt 118 lb (53.5 kg)   SpO2 97%   BMI 21.58 kg/m                 Constitutional: Pt appears in NAD               HENT: Head: NCAT.                Right Ear: External ear normal.                 Left Ear: External ear normal.                Eyes: . Pupils are equal, round, and reactive to light. Conjunctivae and EOM are normal               Nose: without d/c or deformity               Neck: Neck supple. Gross normal ROM               Cardiovascular: Normal rate and regular rhythm.                 Pulmonary/Chest: Effort normal and breath sounds without rales or wheezing.                Abd:  Soft, NT, ND, + BS, no organomegaly               Neurological: Pt is alert. At baseline orientation, motor grossly intact               Skin: Skin is warm. No rashes, no other new lesions, LE edema -  none               Psychiatric: Pt behavior is normal without agitation   Micro: none  Cardiac tracings I have personally interpreted today:  none  Pertinent Radiological findings (summarize): Mar 19 2021 IMPRESSION: Enlargement of cardiac silhouette with pulmonary vascular congestion No acute abnormalities. Aortic  Atherosclerosis (ICD10-I70.0).   Lab Results  Component Value Date   WBC 17.2 (H) 03/03/2021   HGB 10.6 (L) 03/03/2021   HCT 34.6 (L) 03/03/2021   PLT 454 (H) 03/03/2021   GLUCOSE 110 (H) 03/03/2021   CHOL 161 10/15/2020   TRIG 74.0 10/15/2020   HDL 74.30 10/15/2020   LDLCALC 72 10/15/2020   ALT 10 10/15/2020   AST 15 10/15/2020   NA 143 03/03/2021   K 4.0 03/03/2021   CL 109 03/03/2021   CREATININE 1.39 (H) 03/03/2021   BUN 23 03/03/2021   CO2 22 03/03/2021   TSH 0.569 03/03/2021   INR 1.1 05/24/2019   HGBA1C 6.0 10/15/2020   Assessment/Plan:  Elaine Turner is a 75 y.o. Black or African American [2] female with  has a past medical history of AIN III (anal intraepithelial neoplasia III) (06/26/2015), Aortic atherosclerosis ([place]), Chronic diarrhea, COPD  GOLD 0 / active smoker  (10/24/2015), Diverticulitis, Diverticulosis, Esophageal stricture, Heart murmur, Hemorrhoids, Hyperglycemia (05/01/2013), Hypertension, Ischemic optic neuropathy, left eye (05/25/2019), Pneumonia (05/01/2013), PVD (peripheral vascular disease) ([place]), and Urine incontinence.  No problem-specific Assessment & Plan notes found for this encounter.  Followup: No follow-ups on file.  Cathlean Cower, MD 03/28/2021 9:15 AM [facility]

## 2021-03-28 NOTE — Telephone Encounter (Signed)
P waves visible. Continue to monitor.

## 2021-03-29 ENCOUNTER — Encounter: Payer: Self-pay | Admitting: Internal Medicine

## 2021-03-29 NOTE — Assessment & Plan Note (Signed)
Lab Results  Component Value Date   LDLCALC 82 03/28/2021   Stable, pt to continue current statin *liptior 40

## 2021-03-29 NOTE — Assessment & Plan Note (Signed)
To also cont statin and low chol diet

## 2021-03-29 NOTE — Progress Notes (Signed)
Patient ID: Elaine Turner, female   DOB: March 14, 1946, 75 y.o.   MRN: [id number]         Chief Complaint:: wellness exam and Insomnia (Pt states this been happening for a month. Pt states that she has been feeling cold.)  as well as dysphagia, wt loss, anemia, ckd, allergies, low b12, low vit d       HPI:  Elaine Turner is a 75 y.o. female here for wellness exam; up to date with preventive referrals and immunizations                        Also with numerous other concerns, Does have several wks ongoing nasal allergy symptoms with clearish congestion, itch and sneezing, without fever, pain, ST, cough, swelling or wheezing. No overt bleeding.  Has also had worsening wt loss over the past few yrs, and more recent 1-2 mo gradually worsening mild dysphagia to solids but Denies worsening reflux, abd pain, dysphagia, n/v, bowel change or blood. Not taking vit b12 or D regularly.  C/o intermittent mild HA, fatigue, cold all over all the time, low appetite, feels sleepy most all the time.  Denies worsening depressive symptoms, suicidal ideation, or panic;   Peak wt in the past has been 179 at home several yrs ao, now done to 118.   Wt Readings from Last 3 Encounters:  03/28/21 118 lb (53.5 kg)  03/18/21 118 lb (53.5 kg)  03/04/21 117 lb (53.1 kg)   BP Readings from Last 3 Encounters:  03/28/21 122/76  03/18/21 138/84  03/04/21 134/70   Immunization History  Administered Date(s) Administered  . Fluad Quad(high Dose 65+) 09/27/2019  . Influenza, High Dose Seasonal PF 01/06/2018, 11/11/2018  . Influenza-Unspecified 09/29/2020  . Pneumococcal Conjugate-13 04/13/2014, 10/04/2015, 09/27/2019  . Pneumococcal Polysaccharide-23 03/25/2016  . Tdap 08/28/2009  There are no preventive care reminders to display for this patient.    Past Medical History:  Diagnosis Date  . 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 Aortic atherosclerosis ([place])   .  Chronic diarrhea   . COPD  GOLD 0 / active smoker  10/24/2015  . Diverticulitis   . Diverticulosis   . Esophageal stricture   . Heart murmur   . Hemorrhoids   . Hyperglycemia 05/01/2013  . Hypertension   . 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 Pneumonia 05/01/2013  . PVD (peripheral vascular disease) ([place])   . Urine incontinence    Past Surgical History:  Procedure Laterality Date  . ABDOMINAL HYSTERECTOMY    . CARPAL TUNNEL RELEASE Right 09/2019  . HEMORRHOID SURGERY N/A 06/26/2015   Procedure: INTERNAL AND EXTERNAL HEMORRHOIDECTOMY;  Surgeon: Georganna Skeans, MD;  Location: [facility];  Service: General;  Laterality: N/A;  . LOOP RECORDER INSERTION N/A 05/25/2019   Procedure: LOOP RECORDER INSERTION;  Surgeon: Constance Haw, MD;  Location: [facility];  Service: Cardiovascular;  Laterality: N/A;    reports that she has been smoking cigarettes. She has a 62.00 pack-year smoking history. She has never used smokeless tobacco. She reports current alcohol use. She reports that she does not use drugs. family history includes Hypertension in her mother; Lung cancer in her father. Allergies  Allergen Reactions  . Ace Inhibitors Cough  . Levaquin [Levofloxacin] Other (See Comments)    GI upset   Current Outpatient Medications on File Prior to Visit  Medication Sig Dispense  Refill  . albuterol (VENTOLIN HFA) 108 (90 Base) MCG/ACT inhaler Inhale 2 puffs into the lungs every 6 (six) hours as needed for wheezing or shortness of breath. 8 g 2  . aspirin 81 MG EC tablet Take 1 tablet (81 mg total) by mouth daily. Swallow whole. 30 tablet 12  . atorvastatin (LIPITOR) 40 MG tablet TAKE 1 TABLET (40 MG TOTAL) BY MOUTH DAILY AT 6 PM. 90 tablet 3  . benzonatate (TESSALON) 100 MG capsule Take 1 capsule (100 mg total) by mouth every 8 (eight) hours. 21 capsule 0  . bisacodyl (DULCOLAX) 5 MG EC tablet Take 5 mg by mouth daily as needed for moderate constipation.  Patient states she takes 6 a day when she is constipated    . clopidogrel (PLAVIX) 75 MG tablet TAKE 1 TABLET BY MOUTH EVERY DAY 90 tablet 3  . feeding supplement, ENSURE ENLIVE, (ENSURE ENLIVE) LIQD Take 237 mLs by mouth daily at 3 pm. 237 mL 12  . lactulose (CHRONULAC) 10 GM/15ML solution TAKE 30 MLS BY MOUTH 2 TIMES DAILY AS NEEDED FOR MILD CONSTIPATION OR MODERATE CONSTIPATION. 708 mL 1  . losartan (COZAAR) 25 MG tablet TAKE 1 TABLET BY MOUTH TWICE A DAY 180 tablet 0  . losartan (COZAAR) 50 MG tablet Take 1 tablet (50 mg total) by mouth daily. 90 tablet 3  . meloxicam (MOBIC) 7.5 MG tablet Take by mouth.    Marland Kitchen NIFEdipine (PROCARDIA XL/NIFEDICAL-XL) 90 MG 24 hr tablet TAKE 1 TABLET BY MOUTH EVERY DAY 90 tablet 2  . omeprazole (PRILOSEC) 20 MG capsule Take 1 capsule (20 mg total) by mouth daily. 30 capsule 11  . senna (SENOKOT) 8.6 MG tablet Take 1 tablet (8.6 mg total) by mouth daily. 90 tablet 3  . tiZANidine (ZANAFLEX) 2 MG tablet TAKE 1 TABLET (2 MG TOTAL) BY MOUTH EVERY 6 (SIX) HOURS AS NEEDED FOR MUSCLE SPASMS. 30 tablet 2  . traMADol (ULTRAM) 50 MG tablet Take 50 mg by mouth 3 (three) times daily as needed.    . zolpidem (AMBIEN) 5 MG tablet TAKE 1 TABLET (5 MG TOTAL) BY MOUTH AT BEDTIME AS NEEDED FOR SLEEP. 90 tablet 1  . [DISCONTINUED] citalopram (CELEXA) 10 MG tablet Take 1 tablet (10 mg total) by mouth daily. 90 tablet 3  . [DISCONTINUED] fluticasone (FLONASE) 50 MCG/ACT nasal spray Place 2 sprays into both nostrils daily. (Patient taking differently: Place 2 sprays into both nostrils as needed for rhinitis. ) 16 g 6   No current facility-administered medications on file prior to visit.        ROS:  All others reviewed and negative.  Objective        PE:  BP 122/76 (BP Location: Left Arm, Patient Position: Sitting, Cuff Size: Large)   Pulse 94   Temp 98.3 F (36.8 C) (Oral)   Ht _0  (1.575 m)   Wt 118 lb (53.5 kg)   SpO2 97%   BMI 21.58 kg/m                  Constitutional: Pt appears in NAD               HENT: Head: NCAT.                Right Ear: External ear normal.                 Left Ear: External ear normal.  Eyes: . Pupils are equal, round, and reactive to light. Conjunctivae and EOM are normal               Nose: without d/c or deformity               Neck: Neck supple. Gross normal ROM               Cardiovascular: Normal rate and regular rhythm.                 Pulmonary/Chest: Effort normal and breath sounds without rales or wheezing.                Abd:  Soft, NT, ND, + BS, no organomegaly               Neurological: Pt is alert. At baseline orientation, motor grossly intact               Skin: Skin is warm. No rashes, no other new lesions, LE edema - none               Psychiatric: Pt behavior is normal without agitation   Micro: none  Cardiac tracings I have personally interpreted today:  none  Pertinent Radiological findings (summarize): none   Lab Results  Component Value Date   WBC 15.2 (H) 03/28/2021   HGB 8.6 Repeated and verified X2. (L) 03/28/2021   HCT 27.1 (L) 03/28/2021   PLT 899.0 (H) 03/28/2021   GLUCOSE 89 03/28/2021   CHOL 143 03/28/2021   TRIG 81.0 03/28/2021   HDL 44.90 03/28/2021   LDLCALC 82 03/28/2021   ALT 20 03/28/2021   AST 19 03/28/2021   NA 134 (L) 03/28/2021   K 3.5 03/28/2021   CL 97 03/28/2021   CREATININE 1.37 (H) 03/28/2021   BUN 20 03/28/2021   CO2 24 03/28/2021   TSH 0.38 03/28/2021   INR 1.1 05/24/2019   HGBA1C 6.2 03/28/2021   Assessment/Plan:  Elaine Turner is a 75 y.o. Black or African American [2] female with  has a past medical history of AIN III (anal intraepithelial neoplasia III) (06/26/2015), Aortic atherosclerosis ([place]), Chronic diarrhea, COPD  GOLD 0 / active smoker  (10/24/2015), Diverticulitis, Diverticulosis, Esophageal stricture, Heart murmur, Hemorrhoids, Hyperglycemia (05/01/2013), Hypertension, Ischemic optic neuropathy, left eye (05/25/2019),  Pneumonia (05/01/2013), PVD (peripheral vascular disease) ([place]), and Urine incontinence.  Encounter for well adult exam with abnormal findings Age and sex appropriate education and counseling updated with regular exercise and diet Referrals for preventative services - none needed Immunizations addressed - none needed Smoking counseling  - counseled to quit Evidence for depression or other mood disorder - none significant Most recent labs reviewed. I have personally reviewed and have noted: 1) the patient's medical and social history 2) The patient's current medications and supplements 3) The patient's height, weight, and BMI have been recorded in the chart   Weight loss Etiology unclear, also for esr  Vitamin D deficiency Last vitamin D Lab Results  Component Value Date   VD25OH 9.74 (L) 03/28/2021   Low to start oral replacement  Hypertension BP Readings from Last 3 Encounters:  03/28/21 122/76  03/18/21 138/84  03/04/21 134/70   Stable, pt to continue medical treatment losartan, procardia  Tobacco dependence Counseled to quit  Hyperlipidemia Lab Results  Component Value Date   LDLCALC 82 03/28/2021   Stable, pt to continue current statin *liptior 40   Hyperglycemia Lab Results  Component Value Date   HGBA1C  6.2 03/28/2021   Stable, pt to continue current medical treatment  - diet   Dysphagia etiology unclear, for GI referral  COPD  GOLD 0 / active smoker  Stable, cont same tx  CKD (chronic kidney disease) stage 3, GFR 30-59 ml/min (HCC) Lab Results  Component Value Date   CREATININE 1.37 (H) 03/28/2021   Stable overall, cont to avoid nephrotoxins, refer nephrlogy  B12 deficiency Lab Results  Component Value Date   VITAMINB12 270 03/28/2021   Stable, cont oral replacement - b12 1000 mcg qd   Aortic atherosclerosis ([place]) To also cont statin and low chol diet  Anemia, unspecified For f/u iron labs  Followup: Return in about 3 months  (around 06/27/2021).  Cathlean Cower, MD 03/29/2021 10:37 PM [facility]

## 2021-03-29 NOTE — Assessment & Plan Note (Signed)
BP Readings from Last 3 Encounters:  03/28/21 122/76  03/18/21 138/84  03/04/21 134/70   Stable, pt to continue medical treatment losartan, procardia

## 2021-03-29 NOTE — Assessment & Plan Note (Signed)
For f/u iron labs

## 2021-03-29 NOTE — Assessment & Plan Note (Signed)
Age and sex appropriate education and counseling updated with regular exercise and diet Referrals for preventative services - none needed Immunizations addressed - none needed Smoking counseling  - counseled to quit Evidence for depression or other mood disorder - none significant Most recent labs reviewed. I have personally reviewed and have noted: 1) the patient's medical and social history 2) The patient's current medications and supplements 3) The patient's height, weight, and BMI have been recorded in the chart

## 2021-03-29 NOTE — Assessment & Plan Note (Signed)
Last vitamin D Lab Results  Component Value Date   VD25OH 9.74 (L) 03/28/2021   Low to start oral replacement

## 2021-03-29 NOTE — Assessment & Plan Note (Signed)
Etiology unclear, also for esr

## 2021-03-29 NOTE — Assessment & Plan Note (Signed)
Lab Results  Component Value Date   HGBA1C 6.2 03/28/2021   Stable, pt to continue current medical treatment  - diet

## 2021-03-29 NOTE — Assessment & Plan Note (Signed)
Counseled to quit 

## 2021-03-29 NOTE — Assessment & Plan Note (Signed)
Lab Results  Component Value Date   VITAMINB12 270 03/28/2021   Stable, cont oral replacement - b12 1000 mcg qd

## 2021-03-29 NOTE — Assessment & Plan Note (Signed)
Stable, cont same tx 

## 2021-03-29 NOTE — Assessment & Plan Note (Addendum)
Lab Results  Component Value Date   CREATININE 1.37 (H) 03/28/2021   Stable overall, cont to avoid nephrotoxins, refer nephrlogy

## 2021-03-29 NOTE — Assessment & Plan Note (Signed)
etiology unclear, for GI referral

## 2021-03-31 LAB — PROTEIN ELECTROPHORESIS, SERUM
Albumin ELP: 3 g/dL — ABNORMAL LOW (ref 3.8–4.8)
Alpha 2: 1.5 g/dL — ABNORMAL HIGH (ref 0.5–0.9)
Beta 2: 0.5 g/dL (ref 0.2–0.5)
Beta Globulin: 0.4 g/dL (ref 0.4–0.6)
Gamma Globulin: 0.8 g/dL (ref 0.8–1.7)
Total Protein: 7.1 g/dL (ref 6.1–8.1)

## 2021-04-01 ENCOUNTER — Encounter: Payer: Self-pay | Admitting: Internal Medicine

## 2021-04-02 NOTE — Progress Notes (Signed)
Carelink Summary Report / Loop Recorder 

## 2021-04-04 ENCOUNTER — Other Ambulatory Visit: Payer: Self-pay | Admitting: Internal Medicine

## 2021-04-07 DIAGNOSIS — M79671 Pain in right foot: Secondary | ICD-10-CM | POA: Diagnosis not present

## 2021-04-17 ENCOUNTER — Telehealth: Payer: Self-pay

## 2021-04-17 NOTE — Telephone Encounter (Signed)
There were 6 new AF episodes. There was one EGM to view which was fast at 158 bpm and noisy.  ? AF verses SR with frequent PACs/PVCs.  ? [place] if AF.  Sent to triage.  Need download of other episodes.    Pt does have a history of similar noisy episodes.  Reviewed the available EGM and it appears to be the same as previous episodes.  Need manual download for remaining 5 episodes.    Attempted to reach pt, no answer on Mobile number, home number indicates line disconnected.

## 2021-04-21 NOTE — Telephone Encounter (Signed)
Follow-up transmission received.  . 1 AF event duration of 20 minutes; suggestive of AF max rate 298bpm - rhythm shows very irregular R-R interval.    Attempted again to reach pt.  No answer on primary phone, line just rings/ no voicemail.  Mobile number is an invalid number  Pt implanted for Crytogenic stroke.

## 2021-04-21 NOTE — Telephone Encounter (Signed)
Appears sinus tachycardia versus atrial tachycardia.  Continue to monitor.

## 2021-04-23 DIAGNOSIS — M65321 Trigger finger, right index finger: Secondary | ICD-10-CM | POA: Diagnosis not present

## 2021-04-23 DIAGNOSIS — G5603 Carpal tunnel syndrome, bilateral upper limbs: Secondary | ICD-10-CM | POA: Diagnosis not present

## 2021-04-28 ENCOUNTER — Ambulatory Visit (INDEPENDENT_AMBULATORY_CARE_PROVIDER_SITE_OTHER): Payer: Medicare Other

## 2021-04-28 DIAGNOSIS — I639 Cerebral infarction, unspecified: Secondary | ICD-10-CM | POA: Diagnosis not present

## 2021-04-28 LAB — CUP PACEART REMOTE DEVICE CHECK
Date Time Interrogation Session: 20220430232243
Implantable Pulse Generator Implant Date: 20200528

## 2021-05-05 ENCOUNTER — Ambulatory Visit: Payer: Medicare Other | Admitting: Internal Medicine

## 2021-05-15 NOTE — Progress Notes (Signed)
Carelink Summary Report / Loop Recorder 

## 2021-05-28 ENCOUNTER — Institutional Professional Consult (permissible substitution): Payer: Medicare Other | Admitting: Neurology

## 2021-06-01 LAB — CUP PACEART REMOTE DEVICE CHECK
Date Time Interrogation Session: 20220602232210
Implantable Pulse Generator Implant Date: 20200528

## 2021-06-02 ENCOUNTER — Ambulatory Visit (INDEPENDENT_AMBULATORY_CARE_PROVIDER_SITE_OTHER): Payer: Medicare Other

## 2021-06-02 DIAGNOSIS — I639 Cerebral infarction, unspecified: Secondary | ICD-10-CM | POA: Diagnosis not present

## 2021-06-03 ENCOUNTER — Inpatient Hospital Stay: Admission: RE | Admit: 2021-06-03 | Payer: Medicare Other | Source: Ambulatory Visit

## 2021-06-10 ENCOUNTER — Institutional Professional Consult (permissible substitution): Payer: Medicare Other | Admitting: Neurology

## 2021-06-12 ENCOUNTER — Telehealth: Payer: Self-pay | Admitting: Internal Medicine

## 2021-06-12 NOTE — Telephone Encounter (Signed)
Patient is having congestion, runny nose and "tick" in the back of her throat for about 4 days now. She has not taken a test since March 2022. She is asking if she can come in office. Advised her that due to the symptoms would probably need to do a virtual visit but she wanted to confirm with the provider.   Please advise.

## 2021-06-17 ENCOUNTER — Encounter: Payer: Self-pay | Admitting: Internal Medicine

## 2021-06-17 ENCOUNTER — Telehealth (INDEPENDENT_AMBULATORY_CARE_PROVIDER_SITE_OTHER): Payer: Medicare Other | Admitting: Internal Medicine

## 2021-06-17 DIAGNOSIS — F17200 Nicotine dependence, unspecified, uncomplicated: Secondary | ICD-10-CM

## 2021-06-17 DIAGNOSIS — J449 Chronic obstructive pulmonary disease, unspecified: Secondary | ICD-10-CM | POA: Diagnosis not present

## 2021-06-17 MED ORDER — HYDROCODONE BIT-HOMATROP MBR 5-1.5 MG/5ML PO SOLN: 5.0000 mL | Freq: Four times a day (QID) | ORAL | 0 refills | Status: AC | PRN

## 2021-06-17 MED ORDER — DOXYCYCLINE HYCLATE 100 MG PO TABS: 100.0000 mg | ORAL_TABLET | Freq: Two times a day (BID) | ORAL | 0 refills | Status: DC

## 2021-06-17 MED ORDER — PREDNISONE 10 MG PO TABS: 20.0000 mg | ORAL_TABLET | Freq: Every day | ORAL | 0 refills | Status: AC

## 2021-06-17 NOTE — Patient Instructions (Signed)
Please take all new medication as prescribed 

## 2021-06-17 NOTE — Assessment & Plan Note (Signed)
See notes

## 2021-06-17 NOTE — Progress Notes (Signed)
Patient ID: Elaine Turner, female   DOB: September 12, 1946, 75 y.o.   MRN: [id number]  Cumulative time during 7-day interval 14 min, there was not an associated office visit for this concern within a 7 day period.  Verbal consent for services obtained from patient prior to services given.  Names of all persons present for services: Cathlean Cower, MD, patient  Chief complaint: sinus symptoms and cough  History, background, results pertinent:   Here with 2-3 days acute onset fever, facial pain, pressure, headache, general weakness and malaise, and greenish d/c, with mild ST and scant prod cough, but pt denies chest pain, wheezing, increased sob or doe, orthopnea, PND, increased LE swelling, palpitations, dizziness or syncope. Also Does have several wks ongoing nasal allergy symptoms with clearish congestion, itch and sneezing, without fever, pain, ST, cough, swelling or wheezing.  Past Medical History:  Diagnosis Date   AIN III (anal intraepithelial neoplasia III) 06/26/2015   AIN II-III of internal hemorrhoids   Aortic atherosclerosis (HCC)    Chronic diarrhea    COPD  GOLD 0 / active smoker  10/24/2015   Diverticulitis    Diverticulosis    Esophageal stricture    Heart murmur    Hemorrhoids    Hyperglycemia 05/01/2013   Hypertension    Ischemic optic neuropathy, left eye 05/25/2019   Pneumonia 05/01/2013   PVD (peripheral vascular disease) ([place])    Urine incontinence    No results found for this or any previous visit (from the past 25 hour(s)).  Current Outpatient Medications (Endocrine & Metabolic):    predniSONE (DELTASONE) 10 MG tablet, Take 2 tablets (20 mg total) by mouth daily for 7 days. 3 tabs by mouth per day for 3 days, then 2 tabs per day for 3 days, then 1 tab per day for 3 days, then stop  Current Outpatient Medications (Cardiovascular):    atorvastatin (LIPITOR) 40 MG tablet, TAKE 1 TABLET (40 MG TOTAL) BY MOUTH DAILY AT 6 PM.   losartan (COZAAR) 25 MG tablet, TAKE 1 TABLET BY  MOUTH TWICE A DAY   losartan (COZAAR) 50 MG tablet, Take 1 tablet (50 mg total) by mouth daily.   NIFEdipine (PROCARDIA XL/NIFEDICAL-XL) 90 MG 24 hr tablet, TAKE 1 TABLET BY MOUTH EVERY DAY  Current Outpatient Medications (Respiratory):    HYDROcodone bit-homatropine (HYCODAN) 5-1.5 MG/5ML syrup, Take 5 mLs by mouth every 6 (six) hours as needed for up to 10 days for cough.   albuterol (VENTOLIN HFA) 108 (90 Base) MCG/ACT inhaler, TAKE 2 PUFFS BY MOUTH EVERY 6 HOURS AS NEEDED FOR WHEEZE OR SHORTNESS OF BREATH   benzonatate (TESSALON) 100 MG capsule, Take 1 capsule (100 mg total) by mouth every 8 (eight) hours.  Current Outpatient Medications (Analgesics):    aspirin 81 MG EC tablet, Take 1 tablet (81 mg total) by mouth daily. Swallow whole.   meloxicam (MOBIC) 7.5 MG tablet, Take by mouth.   traMADol (ULTRAM) 50 MG tablet, Take 50 mg by mouth 3 (three) times daily as needed.  Current Outpatient Medications (Hematological):    clopidogrel (PLAVIX) 75 MG tablet, TAKE 1 TABLET BY MOUTH EVERY DAY  Current Outpatient Medications (Other):    doxycycline (VIBRA-TABS) 100 MG tablet, Take 1 tablet (100 mg total) by mouth 2 (two) times daily.   bisacodyl (DULCOLAX) 5 MG EC tablet, Take 5 mg by mouth daily as needed for moderate constipation. Patient states she takes 6 a day when she is constipated   feeding supplement, ENSURE ENLIVE, (ENSURE ENLIVE)  LIQD, Take 237 mLs by mouth daily at 3 pm.   lactulose (CHRONULAC) 10 GM/15ML solution, TAKE 30 MLS BY MOUTH 2 TIMES DAILY AS NEEDED FOR MILD CONSTIPATION OR MODERATE CONSTIPATION.   omeprazole (PRILOSEC) 20 MG capsule, Take 1 capsule (20 mg total) by mouth daily.   senna (SENOKOT) 8.6 MG tablet, Take 1 tablet (8.6 mg total) by mouth daily.   tiZANidine (ZANAFLEX) 2 MG tablet, TAKE 1 TABLET (2 MG TOTAL) BY MOUTH EVERY 6 (SIX) HOURS AS NEEDED FOR MUSCLE SPASMS.   zolpidem (AMBIEN) 5 MG tablet, TAKE 1 TABLET (5 MG TOTAL) BY MOUTH AT BEDTIME AS NEEDED FOR  SLEEP. Lab Results  Component Value Date   WBC 15.2 (H) 03/28/2021   HGB 8.6 Repeated and verified X2. (L) 03/28/2021   HCT 27.1 (L) 03/28/2021   PLT 899.0 (H) 03/28/2021   GLUCOSE 89 03/28/2021   CHOL 143 03/28/2021   TRIG 81.0 03/28/2021   HDL 44.90 03/28/2021   LDLCALC 82 03/28/2021   ALT 20 03/28/2021   AST 19 03/28/2021   NA 134 (L) 03/28/2021   K 3.5 03/28/2021   CL 97 03/28/2021   CREATININE 1.37 (H) 03/28/2021   BUN 20 03/28/2021   CO2 24 03/28/2021   TSH 0.38 03/28/2021   INR 1.1 05/24/2019   HGBA1C 6.2 03/28/2021    A/P/next steps:   Sinusitis - for antibx, cough med prn  Allergies - also for predpac asd,  COPD - o/w stable  Cathlean Cower MD

## 2021-06-18 ENCOUNTER — Ambulatory Visit: Payer: Medicare Other | Admitting: Internal Medicine

## 2021-06-18 DIAGNOSIS — Z0289 Encounter for other administrative examinations: Secondary | ICD-10-CM

## 2021-06-23 NOTE — Progress Notes (Signed)
Carelink Summary Report / Loop Recorder 

## 2021-07-07 ENCOUNTER — Ambulatory Visit (INDEPENDENT_AMBULATORY_CARE_PROVIDER_SITE_OTHER): Payer: Medicare Other

## 2021-07-07 DIAGNOSIS — I639 Cerebral infarction, unspecified: Secondary | ICD-10-CM | POA: Diagnosis not present

## 2021-07-07 LAB — CUP PACEART REMOTE DEVICE CHECK
Date Time Interrogation Session: 20220705233509
Implantable Pulse Generator Implant Date: 20200528

## 2021-07-28 NOTE — Progress Notes (Signed)
Carelink Summary Report / Loop Recorder 

## 2021-08-05 ENCOUNTER — Ambulatory Visit (INDEPENDENT_AMBULATORY_CARE_PROVIDER_SITE_OTHER): Payer: Medicare Other | Admitting: Internal Medicine

## 2021-08-05 ENCOUNTER — Ambulatory Visit (INDEPENDENT_AMBULATORY_CARE_PROVIDER_SITE_OTHER): Payer: Medicare Other

## 2021-08-05 ENCOUNTER — Encounter: Payer: Self-pay | Admitting: Internal Medicine

## 2021-08-05 ENCOUNTER — Other Ambulatory Visit: Payer: Self-pay

## 2021-08-05 VITALS — BP 140/84 | HR 76 | Temp 98.4°F | Ht 62.0 in | Wt 112.0 lb

## 2021-08-05 DIAGNOSIS — M25552 Pain in left hip: Secondary | ICD-10-CM | POA: Diagnosis not present

## 2021-08-05 DIAGNOSIS — R1032 Left lower quadrant pain: Secondary | ICD-10-CM | POA: Diagnosis not present

## 2021-08-05 DIAGNOSIS — R700 Elevated erythrocyte sedimentation rate: Secondary | ICD-10-CM | POA: Diagnosis not present

## 2021-08-05 DIAGNOSIS — D649 Anemia, unspecified: Secondary | ICD-10-CM

## 2021-08-05 DIAGNOSIS — E010 Iodine-deficiency related diffuse (endemic) goiter: Secondary | ICD-10-CM

## 2021-08-05 DIAGNOSIS — R911 Solitary pulmonary nodule: Secondary | ICD-10-CM | POA: Diagnosis not present

## 2021-08-05 LAB — URINALYSIS, ROUTINE W REFLEX MICROSCOPIC
Bilirubin Urine: NEGATIVE
Leukocytes,Ua: NEGATIVE
Nitrite: NEGATIVE
Specific Gravity, Urine: 1.025 (ref 1.000–1.030)
Total Protein, Urine: 100 — AB
Urine Glucose: NEGATIVE
Urobilinogen, UA: 0.2 (ref 0.0–1.0)
pH: 6 (ref 5.0–8.0)

## 2021-08-05 LAB — CBC WITH DIFFERENTIAL/PLATELET
Basophils Absolute: 0.1 10*3/uL (ref 0.0–0.1)
Basophils Relative: 0.8 % (ref 0.0–3.0)
Eosinophils Absolute: 0.2 10*3/uL (ref 0.0–0.7)
Eosinophils Relative: 1.8 % (ref 0.0–5.0)
HCT: 32.9 % — ABNORMAL LOW (ref 36.0–46.0)
Hemoglobin: 10.2 g/dL — ABNORMAL LOW (ref 12.0–15.0)
Lymphocytes Relative: 16.8 % (ref 12.0–46.0)
Lymphs Abs: 1.7 10*3/uL (ref 0.7–4.0)
MCHC: 31.1 g/dL (ref 30.0–36.0)
MCV: 71.4 fl — ABNORMAL LOW (ref 78.0–100.0)
Monocytes Absolute: 0.6 10*3/uL (ref 0.1–1.0)
Monocytes Relative: 5.9 % (ref 3.0–12.0)
Neutro Abs: 7.7 10*3/uL (ref 1.4–7.7)
Neutrophils Relative %: 74.7 % (ref 43.0–77.0)
Platelets: 398 10*3/uL (ref 150.0–400.0)
RBC: 4.6 Mil/uL (ref 3.87–5.11)
RDW: 21.6 % — ABNORMAL HIGH (ref 11.5–15.5)
WBC: 10.3 10*3/uL (ref 4.0–10.5)

## 2021-08-05 LAB — IBC PANEL
Iron: 95 ug/dL (ref 42–145)
Saturation Ratios: 22.8 % (ref 20.0–50.0)
Transferrin: 298 mg/dL (ref 212.0–360.0)

## 2021-08-05 LAB — FERRITIN: Ferritin: 60.3 ng/mL (ref 10.0–291.0)

## 2021-08-05 LAB — C-REACTIVE PROTEIN: CRP: <1 mg/dL (ref 0.5–20.0)

## 2021-08-05 LAB — SEDIMENTATION RATE: Sed Rate: 36 mm/hr — ABNORMAL HIGH (ref 0–30)

## 2021-08-05 IMAGING — DX DG HIP (WITH OR WITHOUT PELVIS) 2-3V*L*
3 series · 3 of 3 positions shown · non-contrast
Comparison: [DATE]

CLINICAL DATA: Left lower quadrant and left hip pain

EXAM:
DG HIP (WITH OR WITHOUT PELVIS) 2-3V LEFT

[pelvis ap]
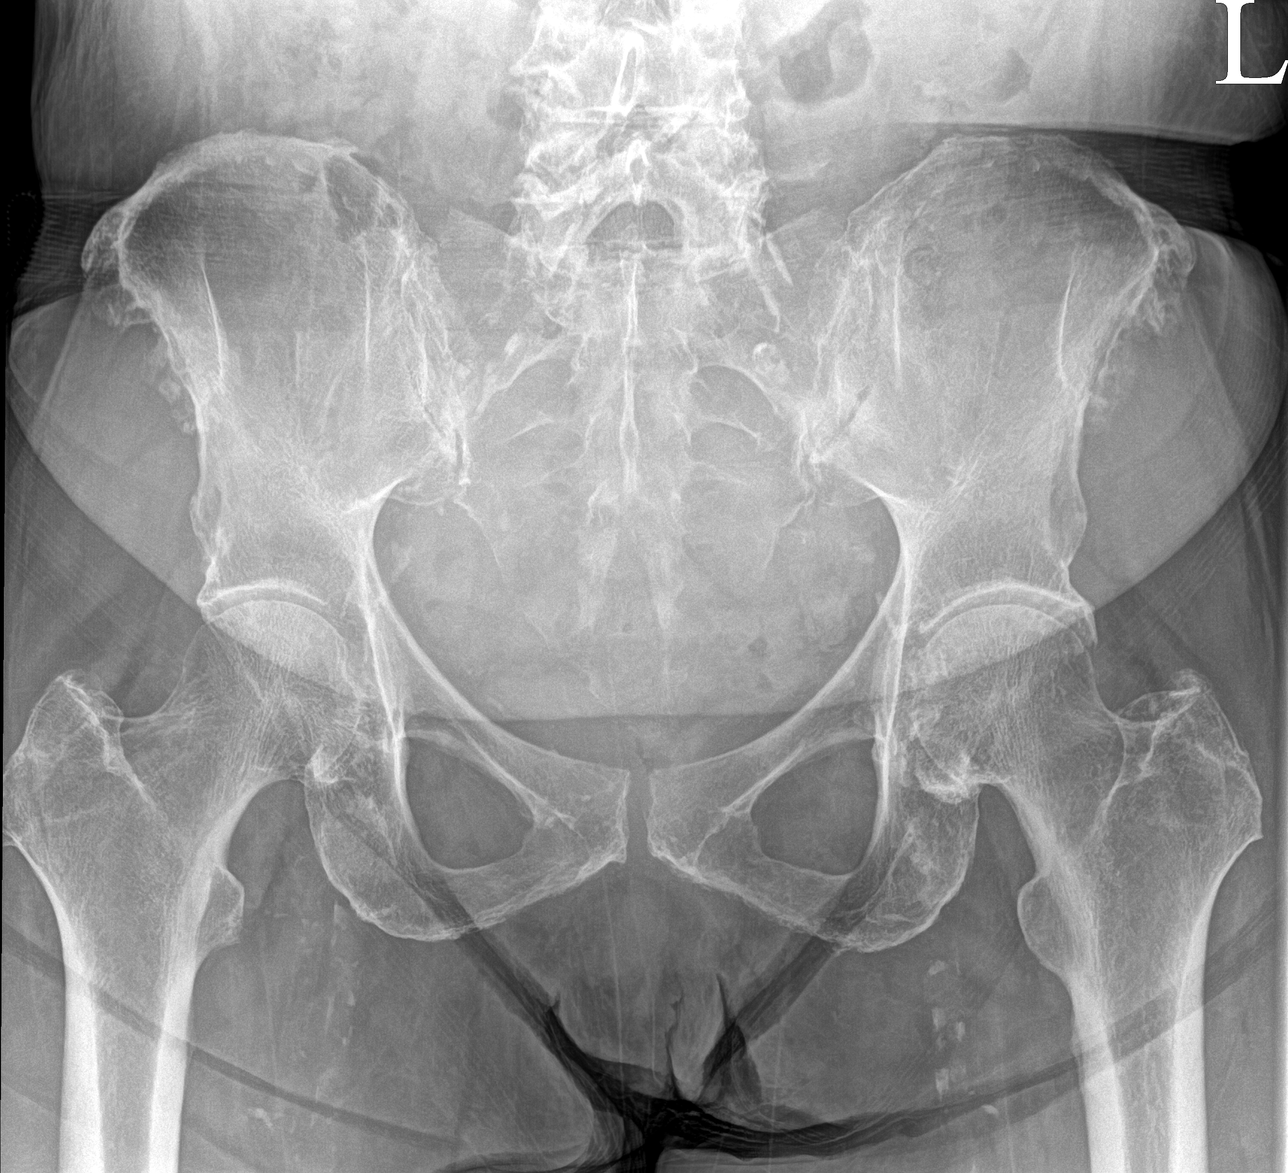

[hip ap]
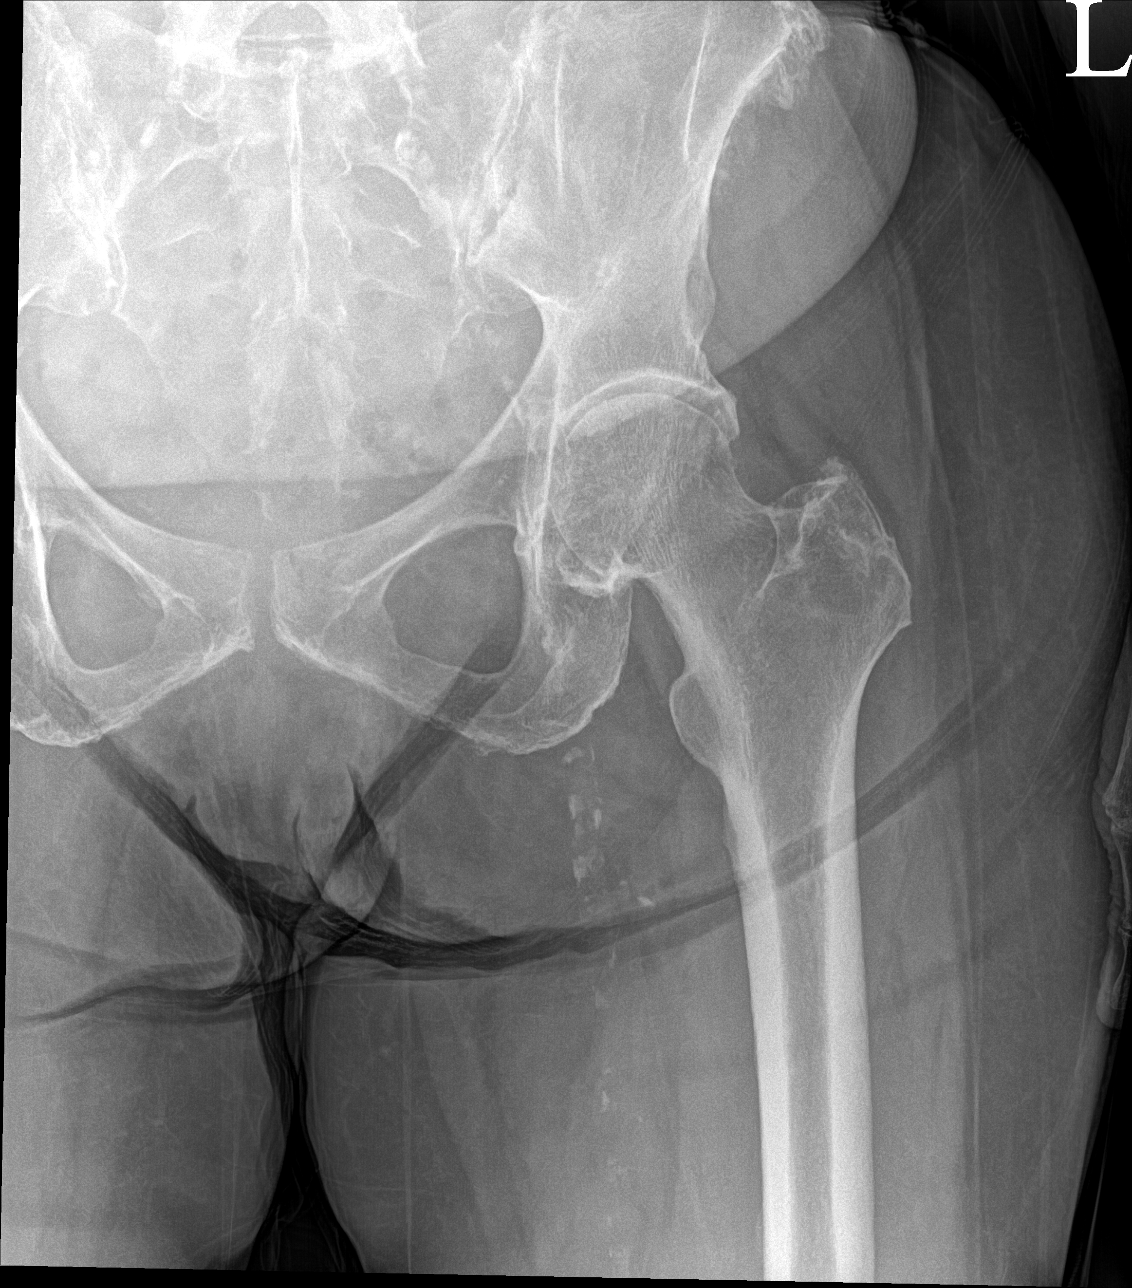

[hip frog leg]
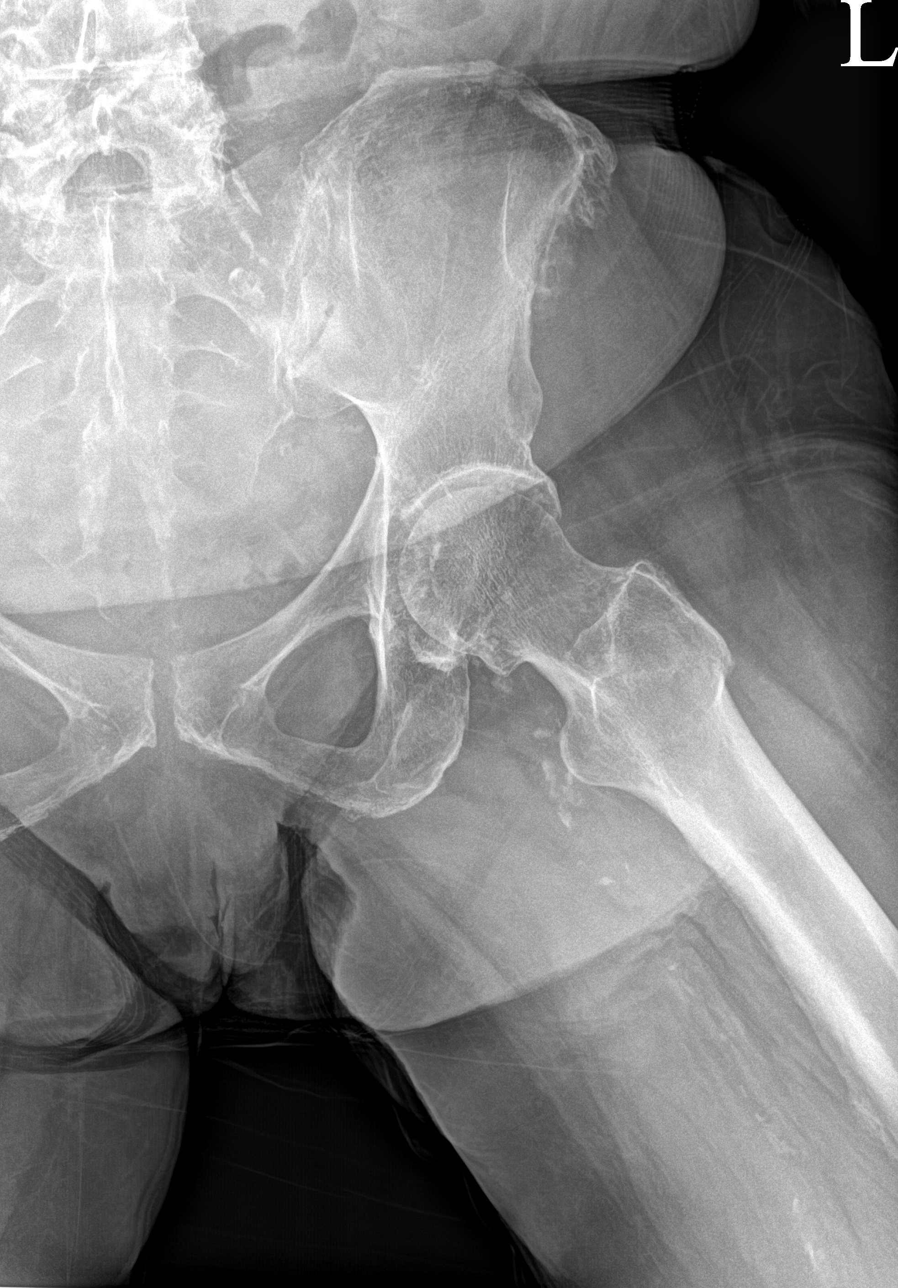

[3 of 3 positions shown; findings below may reference images not displayed]

FINDINGS: Mild degenerative changes in the hips bilaterally, left greater than
right, with joint space narrowing and spurring, not significantly
changed from prior exam. Redemonstrated mild sclerosis about the
inferior left SI joint. There is no evidence of hip fracture,
subluxation, or dislocation. Vascular calcifications.
IMPRESSION: No acute fracture or dislocation. Mild degenerative changes, left
greater than right.

## 2021-08-05 NOTE — Patient Instructions (Addendum)
Please continue all other medications as before, and refills have been done if requested.  Please have the pharmacy call with any other refills you may need.  Please continue your efforts at being more active, low cholesterol diet, and weight control..  Please keep your appointments with your specialists as you may have planned  Please go to the XRAY Department in the first floor for the x-ray testing  Please go to the LAB at the blood drawing area for the tests to be done  You will be contacted by phone if any changes need to be made immediately.  Otherwise, you will receive a letter about your results with an explanation, but please check with MyChart first.  Please remember to sign up for MyChart if you have not done so, as this will be important to you in the future with finding out test results, communicating by private email, and scheduling acute appointments online when needed.  Please make an Appointment to return in 3 months

## 2021-08-05 NOTE — Progress Notes (Signed)
Patient ID: Elaine Turner, female   DOB: 04-23-1946, 75 y.o.   MRN: [id number]        Chief Complaint: follow up left sided pain, RLL nodule, elev sed rate, anemia       HPI:  Elaine Turner is a 75 y.o. female here with c/o of acute onset 2 days LLQ and left sided pain, mild, intermittent, worse to bend or twist, better today, and Denies urinary symptoms such as dysuria, frequency, urgency, flank pain, hematuria or n/v, fever, chills.  Denies worsening reflux, abd pain, dysphagia, n/v, bowel change or blood. Spends her days sitting primarily in the same chair, sleeps there at night as well.   Also had recent RLL nodule noted on last imaging, for f/u.  Pt denies chest pain, increased sob or doe, wheezing, orthopnea, PND, increased LE swelling, palpitations, dizziness or syncope.   Pt denies fever, wt loss, night sweats, loss of appetite, or other constitutional symptoms  No overt bleeding       Wt Readings from Last 3 Encounters:  08/05/21 112 lb (50.8 kg)  03/28/21 118 lb (53.5 kg)  03/18/21 118 lb (53.5 kg)   BP Readings from Last 3 Encounters:  08/05/21 140/84  03/28/21 122/76  03/18/21 138/84         Past Medical History:  Diagnosis Date   AIN III (anal intraepithelial neoplasia III) 06/26/2015   AIN II-III of internal hemorrhoids   Aortic atherosclerosis (HCC)    Chronic diarrhea    COPD  GOLD 0 / active smoker  10/24/2015   Diverticulitis    Diverticulosis    Esophageal stricture    Heart murmur    Hemorrhoids    Hyperglycemia 05/01/2013   Hypertension    Ischemic optic neuropathy, left eye 05/25/2019   Pneumonia 05/01/2013   PVD (peripheral vascular disease) ([place])    Urine incontinence    Past Surgical History:  Procedure Laterality Date   ABDOMINAL HYSTERECTOMY     CARPAL TUNNEL RELEASE Right 09/2019   HEMORRHOID SURGERY N/A 06/26/2015   Procedure: INTERNAL AND EXTERNAL HEMORRHOIDECTOMY;  Surgeon: Georganna Skeans, MD;  Location: [facility];  Service:  General;  Laterality: N/A;   LOOP RECORDER INSERTION N/A 05/25/2019   Procedure: LOOP RECORDER INSERTION;  Surgeon: Constance Haw, MD;  Location: [facility];  Service: Cardiovascular;  Laterality: N/A;    reports that she has been smoking cigarettes. She has a 62.00 pack-year smoking history. She has never used smokeless tobacco. She reports current alcohol use. She reports that she does not use drugs. family history includes Hypertension in her mother; Lung cancer in her father. Allergies  Allergen Reactions   Ace Inhibitors Cough   Levaquin [Levofloxacin] Other (See Comments)    GI upset   Current Outpatient Medications on File Prior to Visit  Medication Sig Dispense Refill   albuterol (VENTOLIN HFA) 108 (90 Base) MCG/ACT inhaler TAKE 2 PUFFS BY MOUTH EVERY 6 HOURS AS NEEDED FOR WHEEZE OR SHORTNESS OF BREATH 6.7 each 2   aspirin 81 MG EC tablet Take 1 tablet (81 mg total) by mouth daily. Swallow whole. 30 tablet 12   atorvastatin (LIPITOR) 40 MG tablet TAKE 1 TABLET (40 MG TOTAL) BY MOUTH DAILY AT 6 PM. 90 tablet 3   bisacodyl (DULCOLAX) 5 MG EC tablet Take 5 mg by mouth daily as needed for moderate constipation. Patient states she takes 6 a day when she is constipated     clopidogrel (PLAVIX) 75  MG tablet TAKE 1 TABLET BY MOUTH EVERY DAY 90 tablet 3   doxycycline (VIBRA-TABS) 100 MG tablet Take 1 tablet (100 mg total) by mouth 2 (two) times daily. 20 tablet 0   feeding supplement, ENSURE ENLIVE, (ENSURE ENLIVE) LIQD Take 237 mLs by mouth daily at 3 pm. 237 mL 12   lactulose (CHRONULAC) 10 GM/15ML solution TAKE 30 MLS BY MOUTH 2 TIMES DAILY AS NEEDED FOR MILD CONSTIPATION OR MODERATE CONSTIPATION. 708 mL 1   losartan (COZAAR) 25 MG tablet TAKE 1 TABLET BY MOUTH TWICE A DAY 180 tablet 0   losartan (COZAAR) 50 MG tablet Take 1 tablet (50 mg total) by mouth daily. 90 tablet 3   meloxicam (MOBIC) 7.5 MG tablet Take by mouth.     NIFEdipine (PROCARDIA XL/NIFEDICAL-XL) 90 MG 24 hr  tablet TAKE 1 TABLET BY MOUTH EVERY DAY 90 tablet 2   omeprazole (PRILOSEC) 20 MG capsule Take 1 capsule (20 mg total) by mouth daily. 30 capsule 11   senna (SENOKOT) 8.6 MG tablet Take 1 tablet (8.6 mg total) by mouth daily. 90 tablet 3   tiZANidine (ZANAFLEX) 2 MG tablet TAKE 1 TABLET (2 MG TOTAL) BY MOUTH EVERY 6 (SIX) HOURS AS NEEDED FOR MUSCLE SPASMS. 30 tablet 2   traMADol (ULTRAM) 50 MG tablet Take 50 mg by mouth 3 (three) times daily as needed.     zolpidem (AMBIEN) 5 MG tablet TAKE 1 TABLET (5 MG TOTAL) BY MOUTH AT BEDTIME AS NEEDED FOR SLEEP. 90 tablet 1   benzonatate (TESSALON) 100 MG capsule Take 1 capsule (100 mg total) by mouth every 8 (eight) hours. (Patient not taking: Reported on 08/05/2021) 21 capsule 0   [DISCONTINUED] citalopram (CELEXA) 10 MG tablet Take 1 tablet (10 mg total) by mouth daily. 90 tablet 3   [DISCONTINUED] fluticasone (FLONASE) 50 MCG/ACT nasal spray Place 2 sprays into both nostrils daily. (Patient taking differently: Place 2 sprays into both nostrils as needed for rhinitis. ) 16 g 6   No current facility-administered medications on file prior to visit.        ROS:  All others reviewed and negative.  Objective        PE:  BP 140/84 (BP Location: Left Arm, Patient Position: Sitting, Cuff Size: Normal)   Pulse 76   Temp 98.4 F (36.9 C) (Oral)   Ht '5\' 2"'$  (1.575 m)   Wt 112 lb (50.8 kg)   SpO2 97%   BMI 20.49 kg/m                 Constitutional: Pt appears in NAD               HENT: Head: NCAT.                Right Ear: External ear normal.                 Left Ear: External ear normal.                Eyes: . Pupils are equal, round, and reactive to light. Conjunctivae and EOM are normal               Nose: without d/c or deformity               Neck: Neck supple. Gross normal ROM               Cardiovascular: Normal rate and regular rhythm.  Pulmonary/Chest: Effort normal and breath sounds without rales or wheezing.                 Abd:  Soft, NT, ND, + BS, no organomegaly               Neurological: Pt is alert. At baseline orientation, motor grossly intact               Skin: Skin is warm. No rashes, no other new lesions, LE edema - none               Psychiatric: Pt behavior is normal without agitation   Micro: none  Cardiac tracings I have personally interpreted today:  none  Pertinent Radiological findings (summarize): CT Angio Chest PE W and/or Wo Contrast 3-7 22 IMPRESSION: 1. Negative for pulmonary embolism. 2. Small focal airspace opacity within the superior aspect of the right lower lobe, favored to represent an infectious or inflammatory process. Consider follow-up noncontrast chest CT in 3 months to exclude the presence of an underlying nodule. 3. Cardiomegaly with reflux of contrast into the IVC and hepatic veins suggesting right heart dysfunction. 4. Enlarged, heterogeneous thyroid gland. Recommend nonemergent thyroid ultrasound (ref: J Am Coll Radiol. 2015 Feb;12(2): 143-50).   Aortic Atherosclerosis (ICD10-I70.0) and Emphysema (ICD10-J43.9).    Lab Results  Component Value Date   WBC 10.3 08/05/2021   HGB 10.2 (L) 08/05/2021   HCT 32.9 (L) 08/05/2021   PLT 398.0 08/05/2021   GLUCOSE 89 03/28/2021   CHOL 143 03/28/2021   TRIG 81.0 03/28/2021   HDL 44.90 03/28/2021   LDLCALC 82 03/28/2021   ALT 20 03/28/2021   AST 19 03/28/2021   NA 134 (L) 03/28/2021   K 3.5 03/28/2021   CL 97 03/28/2021   CREATININE 1.37 (H) 03/28/2021   BUN 20 03/28/2021   CO2 24 03/28/2021   TSH 0.38 03/28/2021   INR 1.1 05/24/2019   HGBA1C 6.2 03/28/2021   Assessment/Plan:  TOIYA NACHTMAN is a 75 y.o. Black or African American [2] female with  has a past medical history of AIN III (anal intraepithelial neoplasia III) (06/26/2015), Aortic atherosclerosis ([place]), Chronic diarrhea, COPD  GOLD 0 / active smoker  (10/24/2015), Diverticulitis, Diverticulosis, Esophageal stricture, Heart murmur, Hemorrhoids,  Hyperglycemia (05/01/2013), Hypertension, Ischemic optic neuropathy, left eye (05/25/2019), Pneumonia (05/01/2013), PVD (peripheral vascular disease) ([place]), and Urine incontinence.  Anemia, unspecified Also for f/u lab,  to f/u any worsening symptoms or concerns  Elevated sed rate Exam benign, ok for f/u lab,  to f/u any worsening symptoms or concerns  LLQ pain Etiology unclear, I suspect msk, but cant r/o other - for UA, also left hip and pelvis films  Pulmonary nodule For f/u CT r/o worsening pulm nodule  Followup: Return in about 3 months (around 11/05/2021).  Cathlean Cower, MD 08/09/2021 3:23 PM [facility]

## 2021-08-06 ENCOUNTER — Encounter: Payer: Self-pay | Admitting: Internal Medicine

## 2021-08-08 LAB — ANTI-DNA ANTIBODY, DOUBLE-STRANDED: ds DNA Ab: 1 IU/mL

## 2021-08-08 LAB — ANA: Anti Nuclear Antibody (ANA): NEGATIVE

## 2021-08-08 LAB — RHEUMATOID FACTOR: Rheumatoid fact SerPl-aCnc: <14 IU/mL (ref <14)

## 2021-08-08 LAB — CYCLIC CITRUL PEPTIDE ANTIBODY, IGG: Cyclic Citrullin Peptide Ab: <16 UNITS

## 2021-08-08 LAB — IMMUNOFIXATION ELECTROPHORESIS
IgG (Immunoglobin G), Serum: 956 mg/dL (ref 600–1540)
IgM, Serum: 14 mg/dL — ABNORMAL LOW (ref 50–300)
Immunofix Electr Int: NOT DETECTED
Immunoglobulin A: 366 mg/dL — ABNORMAL HIGH (ref 70–320)

## 2021-08-09 ENCOUNTER — Encounter: Payer: Self-pay | Admitting: Internal Medicine

## 2021-08-09 DIAGNOSIS — R911 Solitary pulmonary nodule: Secondary | ICD-10-CM | POA: Insufficient documentation

## 2021-08-09 NOTE — Assessment & Plan Note (Signed)
Also for f/u lab,  to f/u any worsening symptoms or concerns 

## 2021-08-09 NOTE — Assessment & Plan Note (Signed)
Etiology unclear, I suspect msk, but cant r/o other - for UA, also left hip and pelvis films

## 2021-08-09 NOTE — Assessment & Plan Note (Signed)
For f/u CT r/o worsening pulm nodule

## 2021-08-09 NOTE — Assessment & Plan Note (Signed)
Exam benign, ok for f/u lab,  to f/u any worsening symptoms or concerns

## 2021-08-11 ENCOUNTER — Ambulatory Visit (INDEPENDENT_AMBULATORY_CARE_PROVIDER_SITE_OTHER): Payer: Medicare Other

## 2021-08-11 DIAGNOSIS — I639 Cerebral infarction, unspecified: Secondary | ICD-10-CM | POA: Diagnosis not present

## 2021-08-11 LAB — CUP PACEART REMOTE DEVICE CHECK
Date Time Interrogation Session: 20220815000500
Implantable Pulse Generator Implant Date: 20200528

## 2021-08-18 ENCOUNTER — Other Ambulatory Visit: Payer: Self-pay

## 2021-08-18 ENCOUNTER — Encounter: Payer: Self-pay | Admitting: Internal Medicine

## 2021-08-18 ENCOUNTER — Ambulatory Visit (INDEPENDENT_AMBULATORY_CARE_PROVIDER_SITE_OTHER): Payer: Medicare Other | Admitting: Internal Medicine

## 2021-08-18 VITALS — BP 118/62 | HR 72 | Temp 98.7°F | Ht 62.0 in | Wt 117.2 lb

## 2021-08-18 DIAGNOSIS — F5101 Primary insomnia: Secondary | ICD-10-CM

## 2021-08-18 DIAGNOSIS — I10 Essential (primary) hypertension: Secondary | ICD-10-CM | POA: Diagnosis not present

## 2021-08-18 DIAGNOSIS — N1832 Chronic kidney disease, stage 3b: Secondary | ICD-10-CM

## 2021-08-18 DIAGNOSIS — R109 Unspecified abdominal pain: Secondary | ICD-10-CM

## 2021-08-18 DIAGNOSIS — R634 Abnormal weight loss: Secondary | ICD-10-CM

## 2021-08-18 DIAGNOSIS — G8929 Other chronic pain: Secondary | ICD-10-CM | POA: Diagnosis not present

## 2021-08-18 DIAGNOSIS — M5442 Lumbago with sciatica, left side: Secondary | ICD-10-CM

## 2021-08-18 MED ORDER — TRAMADOL HCL 50 MG PO TABS: 50.0000 mg | ORAL_TABLET | Freq: Three times a day (TID) | ORAL | 2 refills | Status: DC | PRN

## 2021-08-18 MED ORDER — NIFEDIPINE ER OSMOTIC RELEASE 90 MG PO TB24: 90.0000 mg | ORAL_TABLET | Freq: Every day | ORAL | 3 refills | Status: DC

## 2021-08-18 MED ORDER — TIZANIDINE HCL 2 MG PO TABS: 2.0000 mg | ORAL_TABLET | Freq: Four times a day (QID) | ORAL | 2 refills | Status: DC | PRN

## 2021-08-18 MED ORDER — LOSARTAN POTASSIUM 50 MG PO TABS: 50.0000 mg | ORAL_TABLET | Freq: Every day | ORAL | 3 refills | Status: DC

## 2021-08-18 MED ORDER — TRAZODONE HCL 50 MG PO TABS: 25.0000 mg | ORAL_TABLET | Freq: Every evening | ORAL | 1 refills | Status: DC | PRN

## 2021-08-18 MED ORDER — OMEPRAZOLE 20 MG PO CPDR: 20.0000 mg | DELAYED_RELEASE_CAPSULE | Freq: Every day | ORAL | 3 refills | Status: DC

## 2021-08-18 MED ORDER — CLOPIDOGREL BISULFATE 75 MG PO TABS: 75.0000 mg | ORAL_TABLET | Freq: Every day | ORAL | 3 refills | Status: DC

## 2021-08-18 MED ORDER — ATORVASTATIN CALCIUM 40 MG PO TABS: 40.0000 mg | ORAL_TABLET | Freq: Every day | ORAL | 3 refills | Status: DC

## 2021-08-18 MED ORDER — MELOXICAM 7.5 MG PO TABS: 7.5000 mg | ORAL_TABLET | Freq: Every day | ORAL | 1 refills | Status: DC | PRN

## 2021-08-18 NOTE — Patient Instructions (Signed)
Ok to restart the tramadol and muscle relaxer for pain  You will be contacted regarding the referral for: MRI for the lower back  If this is not helpful, we may need to consider a CT scan of the abdominal area  Ok to change the ambien to trazodone as needed for sleep  Please continue all other medications as before, and refills have been done if requested.  Please have the pharmacy call with any other refills you may need.  Please continue your efforts at being more active, low cholesterol diet, and weight control.  Please keep your appointments with your specialists as you may have planned  Please make an Appointment to return in 1 month

## 2021-08-18 NOTE — Progress Notes (Signed)
Patient ID: Elaine Turner, female   DOB: December 20, 1946, 75 y.o.   MRN: [id number]        Chief Complaint: follow up left low back pain       HPI:  Elaine Turner is a 74 y.o. female here with c/o persistent left sided pain now maybe more starting at the left lower back, moderate, constant, mostly mild but occasionally moderate, worse to bend and twist at the waist, with radiation primarily to the left side and LLQ, nothing else makes better or worse.  Denies urinary symptoms such as dysuria, frequency, urgency, flank pain, hematuria or n/v, fever, chills.  Denies worsening reflux, abd pain, dysphagia, n/v, bowel change or blood.   Pt denies fever, wt loss, night sweats, loss of appetite, or other constitutional symptoms  Also having more difficulty with sleeping, in that Elaine Turner on longer working well.        Wt Readings from Last 3 Encounters:  08/18/21 117 lb 3.2 oz (53.2 kg)  08/05/21 112 lb (50.8 kg)  03/28/21 118 lb (53.5 kg)   BP Readings from Last 3 Encounters:  08/18/21 118/62  08/05/21 140/84  03/28/21 122/76         Past Medical History:  Diagnosis Date   AIN III (anal intraepithelial neoplasia III) 06/26/2015   AIN II-III of internal hemorrhoids   Aortic atherosclerosis (HCC)    Chronic diarrhea    COPD  GOLD 0 / active smoker  10/24/2015   Diverticulitis    Diverticulosis    Esophageal stricture    Heart murmur    Hemorrhoids    Hyperglycemia 05/01/2013   Hypertension    Ischemic optic neuropathy, left eye 05/25/2019   Pneumonia 05/01/2013   PVD (peripheral vascular disease) ([place])    Urine incontinence    Past Surgical History:  Procedure Laterality Date   ABDOMINAL HYSTERECTOMY     CARPAL TUNNEL RELEASE Right 09/2019   HEMORRHOID SURGERY N/A 06/26/2015   Procedure: INTERNAL AND EXTERNAL HEMORRHOIDECTOMY;  Surgeon: Georganna Skeans, MD;  Location: [facility];  Service: General;  Laterality: N/A;   LOOP RECORDER INSERTION N/A 05/25/2019   Procedure: LOOP  RECORDER INSERTION;  Surgeon: Constance Haw, MD;  Location: [facility];  Service: Cardiovascular;  Laterality: N/A;    reports that she has been smoking cigarettes. She has a 62.00 pack-year smoking history. She has never used smokeless tobacco. She reports current alcohol use. She reports that she does not use drugs. family history includes Hypertension in her mother; Lung cancer in her father. Allergies  Allergen Reactions   Ace Inhibitors Cough   Levaquin [Levofloxacin] Other (See Comments)    GI upset   Current Outpatient Medications on File Prior to Visit  Medication Sig Dispense Refill   albuterol (VENTOLIN HFA) 108 (90 Base) MCG/ACT inhaler TAKE 2 PUFFS BY MOUTH EVERY 6 HOURS AS NEEDED FOR WHEEZE OR SHORTNESS OF BREATH 6.7 each 2   aspirin 81 MG EC tablet Take 1 tablet (81 mg total) by mouth daily. Swallow whole. 30 tablet 12   bisacodyl (DULCOLAX) 5 MG EC tablet Take 5 mg by mouth daily as needed for moderate constipation. Patient states she takes 6 a day when she is constipated     [DISCONTINUED] citalopram (CELEXA) 10 MG tablet Take 1 tablet (10 mg total) by mouth daily. 90 tablet 3   [DISCONTINUED] fluticasone (FLONASE) 50 MCG/ACT nasal spray Place 2 sprays into both nostrils daily. (Patient taking differently: Place 2 sprays  into both nostrils as needed for rhinitis. ) 16 g 6   No current facility-administered medications on file prior to visit.        ROS:  All others reviewed and negative.  Objective        PE:  BP 118/62 (BP Location: Right Arm, Patient Position: Sitting, Cuff Size: Normal)   Pulse 72   Temp 98.7 F (37.1 C) (Oral)   Ht '5\' 2"'$  (1.575 m)   Wt 117 lb 3.2 oz (53.2 kg)   SpO2 98%   BMI 21.44 kg/m                 Constitutional: Pt appears in NAD               HENT: Head: NCAT.                Right Ear: External ear normal.                 Left Ear: External ear normal.                Eyes: . Pupils are equal, round, and reactive to  light. Conjunctivae and EOM are normal               Nose: without d/c or deformity               Neck: Neck supple. Gross normal ROM               Cardiovascular: Normal rate and regular rhythm.                 Pulmonary/Chest: Effort normal and breath sounds without rales or wheezing.                Abd:  Soft, NT, ND, + BS, no organomegaly               Neurological: Pt is alert. At baseline orientation, motor grossly intact; spine nontender in midline, has no significant muscular paravertebral tender               Skin: Skin is warm. No rashes, no other new lesions, LE edema - none               Psychiatric: Pt behavior is normal without agitation   Micro: none  Cardiac tracings I have personally interpreted today:  none  Pertinent Radiological findings (summarize): none   Lab Results  Component Value Date   WBC 10.3 08/05/2021   HGB 10.2 (L) 08/05/2021   HCT 32.9 (L) 08/05/2021   PLT 398.0 08/05/2021   GLUCOSE 89 03/28/2021   CHOL 143 03/28/2021   TRIG 81.0 03/28/2021   HDL 44.90 03/28/2021   LDLCALC 82 03/28/2021   ALT 20 03/28/2021   AST 19 03/28/2021   NA 134 (L) 03/28/2021   K 3.5 03/28/2021   CL 97 03/28/2021   CREATININE 1.37 (H) 03/28/2021   BUN 20 03/28/2021   CO2 24 03/28/2021   TSH 0.38 03/28/2021   INR 1.1 05/24/2019   HGBA1C 6.2 03/28/2021   Assessment/Plan:  Elaine Turner is a 75 y.o. Black or African American [2] female with  has a past medical history of AIN III (anal intraepithelial neoplasia III) (06/26/2015), Aortic atherosclerosis ([place]), Chronic diarrhea, COPD  GOLD 0 / active smoker  (10/24/2015), Diverticulitis, Diverticulosis, Esophageal stricture, Heart murmur, Hemorrhoids, Hyperglycemia (05/01/2013), Hypertension, Ischemic optic neuropathy, left eye (05/25/2019), Pneumonia (05/01/2013), PVD (peripheral vascular disease) ([place]), and  Urine incontinence.  Left low back pain Etiology unclear, for MRI LS spine, tramadol prn, tizanidine prn but consider  CT abd/pelvis if MRI not helpful and/or tx not improving.  CKD (chronic kidney disease) stage 3, GFR 30-59 ml/min (HCC) Lab Results  Component Value Date   CREATININE 1.37 (H) 03/28/2021   Stable overall, cont to avoid nephrotoxins   Abdominal pain ? Referred pain vs other - declines other lab for now,  to f/u any worsening symptoms or concerns  Hypertension BP Readings from Last 3 Encounters:  08/18/21 118/62  08/05/21 140/84  03/28/21 122/76   Stable, pt to continue medical treatment losartan, procardia    Insomnia Ok for change ambien to trazodone qhs prn,  to f/u any worsening symptoms or concerns  Followup: No follow-ups on file.  Cathlean Cower, MD 08/21/2021 8:28 PM [facility]

## 2021-08-21 NOTE — Assessment & Plan Note (Signed)
Ok for change ambien to trazodone qhs prn,  to f/u any worsening symptoms or concerns

## 2021-08-21 NOTE — Assessment & Plan Note (Signed)
?   Referred pain vs other - declines other lab for now,  to f/u any worsening symptoms or concerns

## 2021-08-21 NOTE — Assessment & Plan Note (Signed)
Etiology unclear, for MRI LS spine, tramadol prn, tizanidine prn but consider CT abd/pelvis if MRI not helpful and/or tx not improving.

## 2021-08-21 NOTE — Assessment & Plan Note (Signed)
BP Readings from Last 3 Encounters:  08/18/21 118/62  08/05/21 140/84  03/28/21 122/76   Stable, pt to continue medical treatment losartan, procardia

## 2021-08-21 NOTE — Assessment & Plan Note (Signed)
Lab Results  Component Value Date   CREATININE 1.37 (H) 03/28/2021   Stable overall, cont to avoid nephrotoxins

## 2021-08-22 ENCOUNTER — Encounter: Payer: Self-pay | Admitting: Internal Medicine

## 2021-08-29 NOTE — Progress Notes (Signed)
Carelink Summary Report / Loop Recorder 

## 2021-09-05 ENCOUNTER — Other Ambulatory Visit: Payer: Self-pay

## 2021-09-05 ENCOUNTER — Ambulatory Visit
Admission: RE | Admit: 2021-09-05 | Discharge: 2021-09-05 | Disposition: A | Discharge status: Home / Self Care | Payer: Medicare Other | Source: Ambulatory Visit | Attending: Internal Medicine | Admitting: Internal Medicine

## 2021-09-05 DIAGNOSIS — M48061 Spinal stenosis, lumbar region without neurogenic claudication: Secondary | ICD-10-CM | POA: Diagnosis not present

## 2021-09-05 DIAGNOSIS — M545 Low back pain, unspecified: Secondary | ICD-10-CM | POA: Diagnosis not present

## 2021-09-05 DIAGNOSIS — G8929 Other chronic pain: Secondary | ICD-10-CM

## 2021-09-05 IMAGING — MR MR LUMBAR SPINE W/O CM
4 series · 37 of 48 positions shown · non-contrast
Comparison: Radiography [DATE].

CLINICAL DATA: Left low back pain over the last 2 months.

EXAM:
MRI LUMBAR SPINE WITHOUT CONTRAST
TECHNIQUE: Multiplanar, multisequence MR imaging of the lumbar spine was
performed. No intravenous contrast was administered.

[Series 3: T2 · sagittal · 4.0mm · 1.09mm/px · 8 of 17 slices shown (1 of 2)]
[im 1/17]
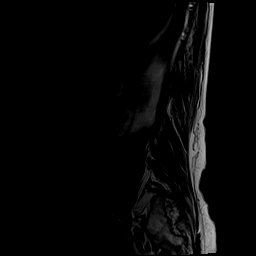
[im 3/17]
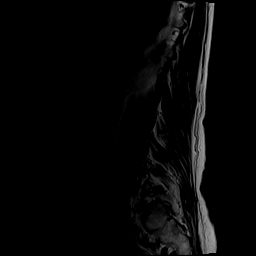
[im 5/17]
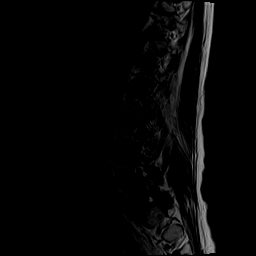
[im 7/17]
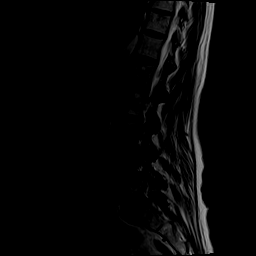
[im 10/17]
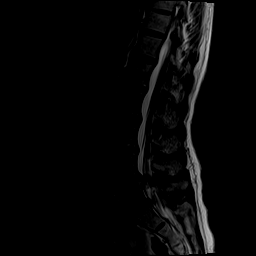
[im 12/17]
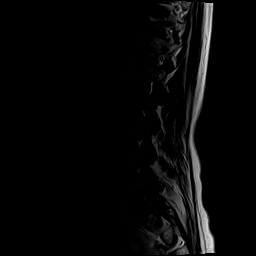
[im 14/17]
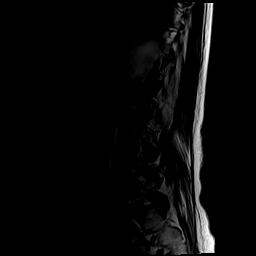
[im 17/17]
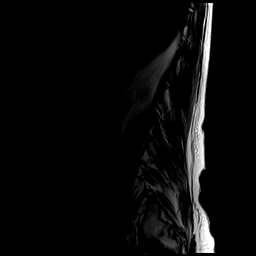

[Series 4: STIR · sagittal · 4.0mm · 1.09mm/px · 9 of 17 slices shown]
[im 1/17]
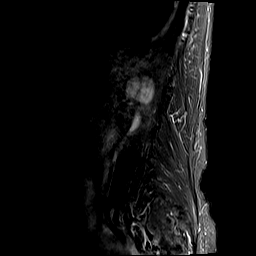
[im 3/17]
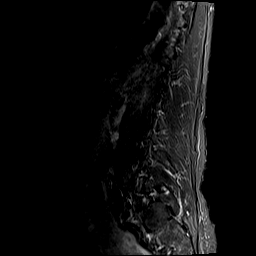
[im 5/17]
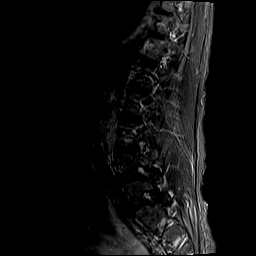
[im 7/17]
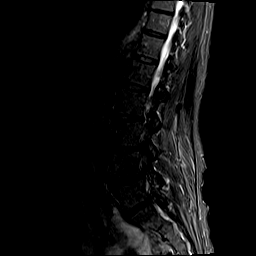
[im 9/17]
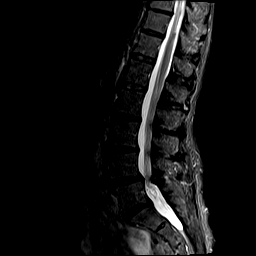
[im 11/17]
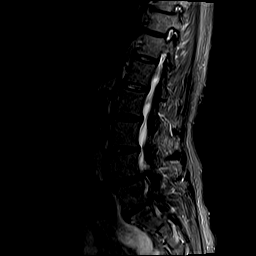
[im 13/17]
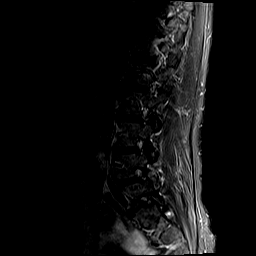
[im 15/17]
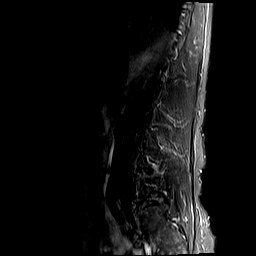
[im 17/17]
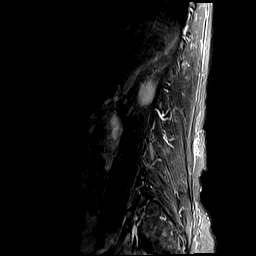

[Series 5: T1 · sagittal · 4.0mm · 1.09mm/px · 9 of 17 slices shown]
[im 1/17]
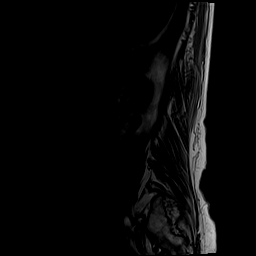
[im 3/17]
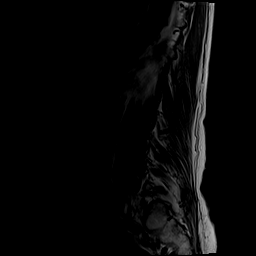
[im 5/17]
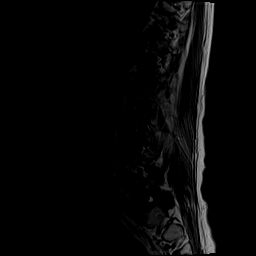
[im 7/17]
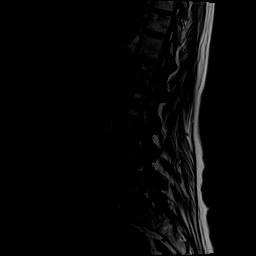
[im 9/17]
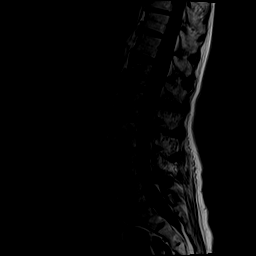
[im 11/17]
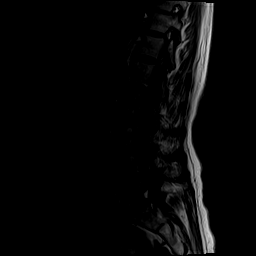
[im 13/17]
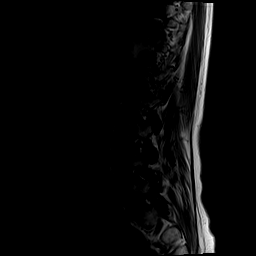
[im 15/17]
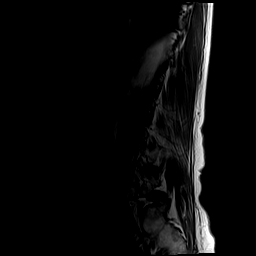
[im 17/17]
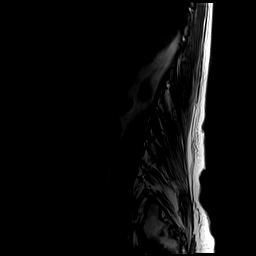

[Series 6: T2 · axial · 4.0mm · 0.39mm/px · z∈[-51,+156]mm · 11 of 42 slices shown (2 of 2)]
[im 2/42]
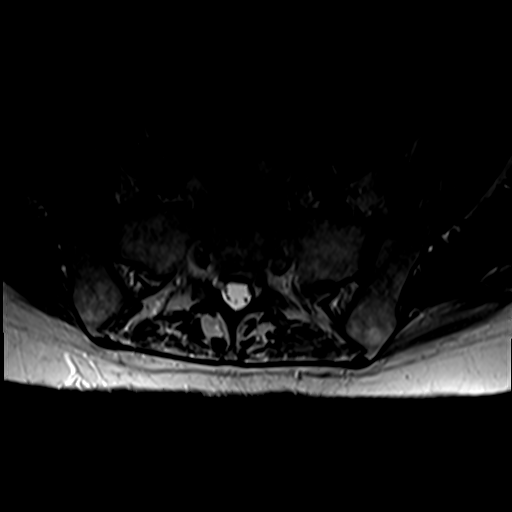
[im 6/42]
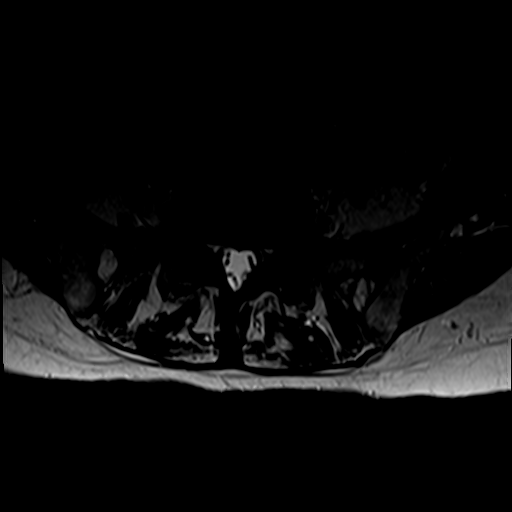
[im 8/42]
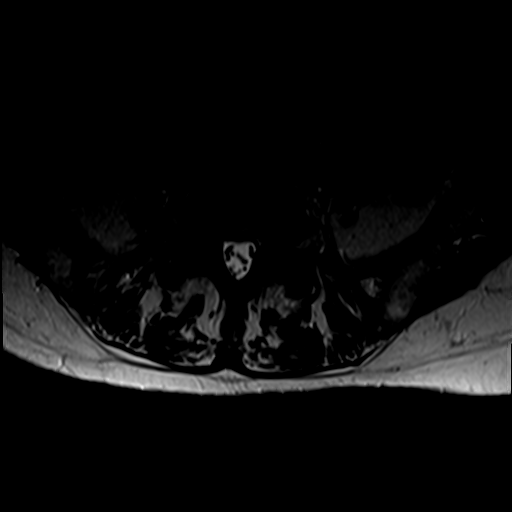
[im 12/42]
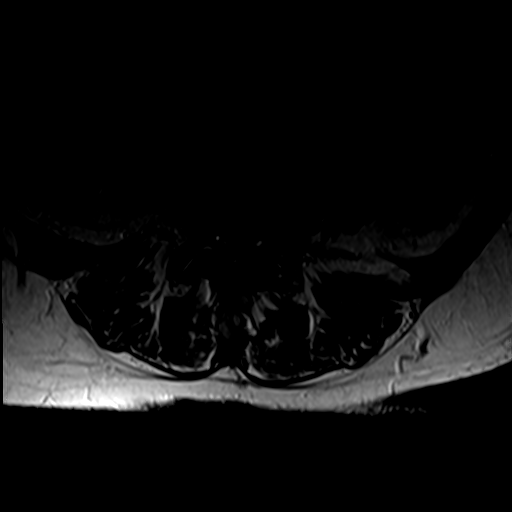
[im 18/42]
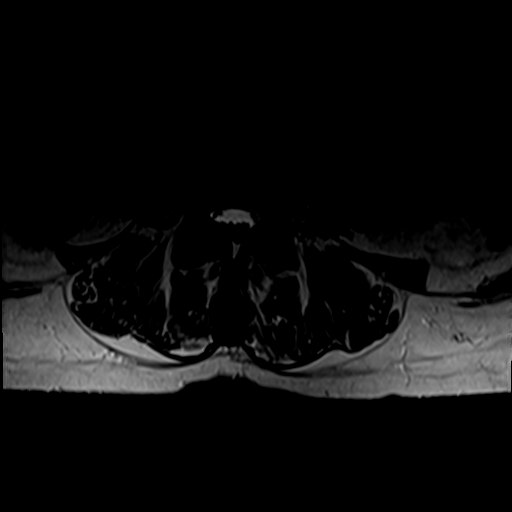
[im 22/42]
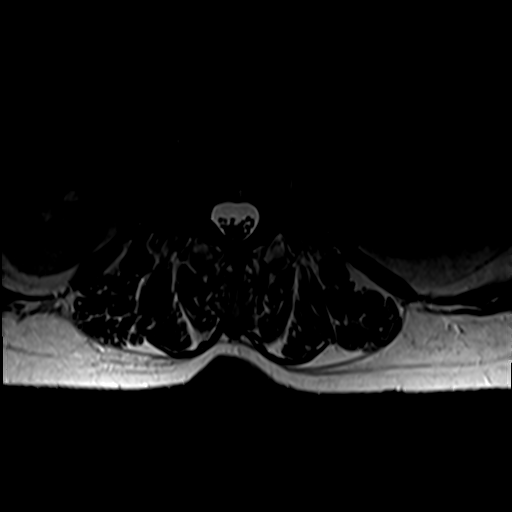
[im 24/42]
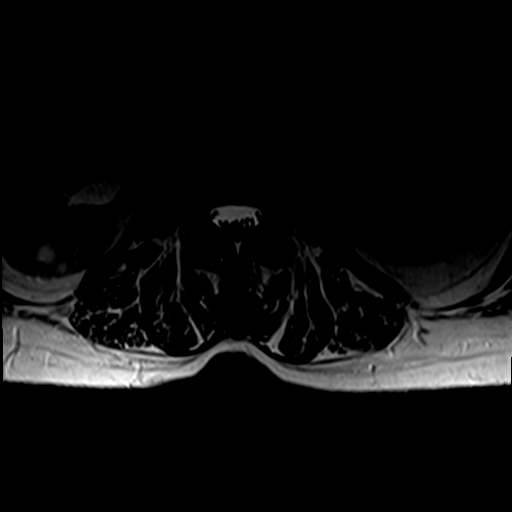
[im 30/42]
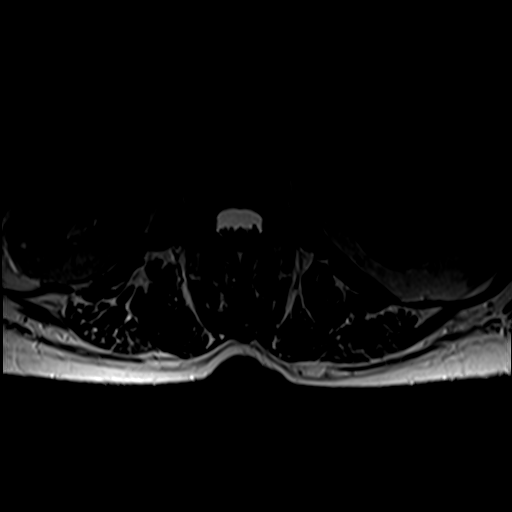
[im 34/42]
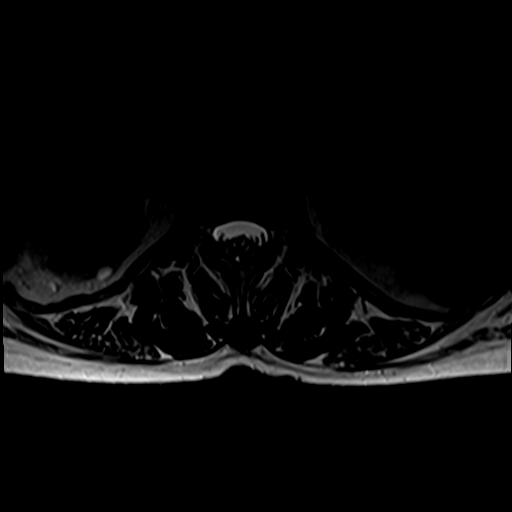
[im 36/42]
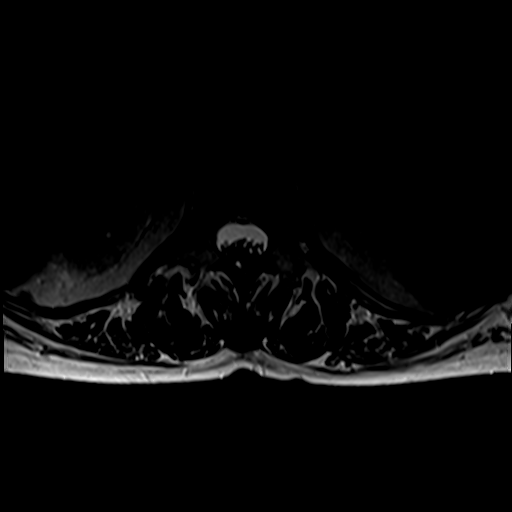
[im 40/42]
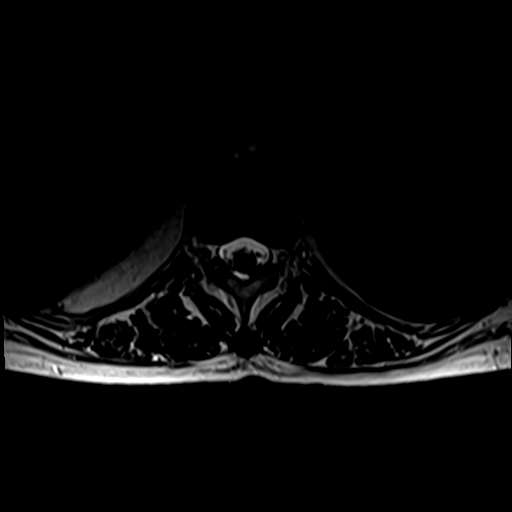

[37 of 48 positions shown; findings below may reference images not displayed]

FINDINGS: Segmentation:  5 lumbar type vertebral bodies.

Alignment: 3 mm degenerative anterolisthesis at L4-5 as seen at
previous radiography.

Vertebrae:  No fracture or focal bone lesion.

Conus medullaris and cauda equina: Conus extends to the L1-2 level.
Conus and cauda equina appear normal.

Paraspinal and other soft tissues: Negative except for renal cysts.

Disc levels:

No significant finding at L1-2 or above.

L2-3: Mild bulging of the disc.  No compressive stenosis.

L3-4: Mild bulging of the disc. Mild facet and ligamentous
hypertrophy. Mild narrowing of the lateral recesses but no likely
compressive stenosis.

L4-5: Bilateral facet degeneration and hypertrophy. Degenerative
anterolisthesis of 3 mm. Bulging of the disc. Multifactorial
stenosis of the canal and lateral recesses that could possibly cause
neural compression. Findings at this level could certainly relate to
low back pain.

L5-S1: Mild bulging of the disc. Mild facet and ligamentous
hypertrophy. Mild narrowing of the subarticular lateral recesses and
neural foramina but no likely neural compression.
IMPRESSION: The dominant findings are at the L4-5 level where there is advanced
bilateral facet arthropathy with 3 mm of anterolisthesis. In
combination with bulging of the disc, there is moderate
multifactorial stenosis of the canal and lateral recesses which
could possibly cause neural compression on either or both sides.
Findings could certainly relate to regional low back pain.

Lesser, grossly non-compressive degenerative changes at L2-3, L3-4
and L5-S1. Findings at these levels could contribute to regional
back pain

## 2021-09-08 ENCOUNTER — Encounter: Payer: Self-pay | Admitting: Internal Medicine

## 2021-09-08 ENCOUNTER — Other Ambulatory Visit: Payer: Self-pay | Admitting: Internal Medicine

## 2021-09-08 DIAGNOSIS — G8929 Other chronic pain: Secondary | ICD-10-CM

## 2021-09-11 LAB — CUP PACEART REMOTE DEVICE CHECK
Date Time Interrogation Session: 20220910005418
Implantable Pulse Generator Implant Date: 20200528

## 2021-09-12 ENCOUNTER — Ambulatory Visit (INDEPENDENT_AMBULATORY_CARE_PROVIDER_SITE_OTHER): Payer: Medicare Other

## 2021-09-12 DIAGNOSIS — I639 Cerebral infarction, unspecified: Secondary | ICD-10-CM

## 2021-09-16 ENCOUNTER — Telehealth: Payer: Self-pay | Admitting: Neurology

## 2021-09-16 ENCOUNTER — Ambulatory Visit: Payer: Medicare Other | Admitting: Neurology

## 2021-09-16 ENCOUNTER — Encounter: Payer: Self-pay | Admitting: Neurology

## 2021-09-16 VITALS — BP 161/63 | HR 81 | Ht 62.0 in | Wt 119.4 lb

## 2021-09-16 DIAGNOSIS — H3412 Central retinal artery occlusion, left eye: Secondary | ICD-10-CM | POA: Diagnosis not present

## 2021-09-16 DIAGNOSIS — R739 Hyperglycemia, unspecified: Secondary | ICD-10-CM | POA: Diagnosis not present

## 2021-09-16 DIAGNOSIS — R202 Paresthesia of skin: Secondary | ICD-10-CM

## 2021-09-16 DIAGNOSIS — I10 Essential (primary) hypertension: Secondary | ICD-10-CM | POA: Diagnosis not present

## 2021-09-16 MED ORDER — TOPIRAMATE 25 MG PO TABS: 25.0000 mg | ORAL_TABLET | Freq: Two times a day (BID) | ORAL | 3 refills | Status: DC

## 2021-09-16 NOTE — Patient Instructions (Signed)
I had a long discussion with the patient regards to her chronic bilateral hand paresthesias discussed differential diagnosis and plan for further work-up and answered questions.  I recommended treatment trial of Topamax 25 mg twice daily to be increased if tolerated without side effects.  Check MRI scan cervical spine to rule out any compressive lesions as well as check EMG nerve conduction study for any underlying neuropathy.  Check neuropathy panel labs.  Continue aspirin for stroke prevention given her prior history of central retinal artery occlusion and maintain aggressive risk factor modification.  Return for follow-up in 2 months or call earlier if necessary. Paresthesia Paresthesia is a burning or prickling feeling. This feeling can happen in any part of the body. It often happens in the hands, arms, legs, or feet. Usually, it is not painful. In most cases, the feeling goes away in a short time and is not a sign of a serious problem. If you have paresthesia that lasts a long time, you need to be seen by your doctor. Follow these instructions at home: Alcohol use  Do not drink alcohol if: Your doctor tells you not to drink. You are pregnant, may be pregnant, or are planning to become pregnant. If you drink alcohol: Limit how much you use to: 0-1 drink a day for women. 0-2 drinks a day for men. Be aware of how much alcohol is in your drink. In the [place] one drink equals one 12 oz bottle of beer (355 mL), one 5 oz glass of wine (148 mL), or one 1 oz glass of hard liquor (44 mL). Nutrition  Eat a healthy diet. This includes: Eating foods that have a lot of fiber in them, such as fresh fruits and vegetables, whole grains, and beans. Limiting foods that have a lot of fat and processed sugars in them, such as fried or sweet foods. General instructions Take over-the-counter and prescription medicines only as told by your doctor. Do not use any products that have nicotine or tobacco in them,  such as cigarettes and e-cigarettes. If you need help quitting, ask your doctor. If you have diabetes, work with your doctor to make sure your blood sugar stays in a healthy range. If your feet feel numb: Check for redness, warmth, and swelling every day. Wear padded socks and comfortable shoes. These help protect your feet. Keep all follow-up visits as told by your doctor. This is important. Contact a doctor if: You have paresthesia that gets worse or does not go away. Lose feeling (have numbness) after an injury. Your burning or prickling feeling gets worse when you walk. You have pain or cramps. You feel dizzy or pass out (faint). You have a rash. Get help right away if you: Feel weak or have new weakness in an arm or leg. Have trouble walking or moving. Have problems speaking, understanding, or seeing. Feel confused. Cannot control when you pee (urinate) or poop (have a bowel movement). Summary Paresthesia is a burning or prickling feeling. It often happens in the hands, arms, legs, or feet. In most cases, the feeling goes away in a short time and is not a sign of a serious problem. If you have paresthesia that lasts a long time, you need to be seen by your doctor. This information is not intended to replace advice given to you by your health care provider. Make sure you discuss any questions you have with your health care provider. Document Revised: 09/24/2020 Document Reviewed: 09/24/2020 Elsevier Patient Education  2022 Elsevier  Inc.  

## 2021-09-16 NOTE — Telephone Encounter (Signed)
[employer] medicare order sent to [facility]. [facility] they will reach out to the patient to schedule.  

## 2021-09-16 NOTE — Progress Notes (Signed)
[name] [address] [place]. [place] [address] [phone]       OFFICE CONSULT NOTE  [name] Date of Birth:  1946-02-06 Medical Record Number:  [id number]   Referring MD: Suella Broad  Reason for Referral: Tingling numbness and  HPI: Elaine Turner is a pleasant 75 year old African-American lady seen today for office consultation visit for hand numbness.  History is obtained from the patient, review of electronic medical records and personally reviewed pertinent available imaging films in PACS.  She complains of a 1 year history of bilateral hand numbness.  This she feels started after she had some plumbing work done in her house and the plan but had warned her not to touch since chemicals were used.  Is numbness is intermittent involves all fingertips and is constant.  Its not disabling and she is able to sleep.  She has not taken any medications.  She was diagnosed as having bilateral carpal tunnel syndrome and she did have carpal tunnel surgery on the right wrist on 10/26/2019 but it did not help her symptoms.  Symptoms seem to involve all 5 digits equally though they have often begin in the ulnar 2 digits.  Review of referral notes did show that she has had 2 EMGs on 08/30/2019 and 05/04/2020 which had shown prolonged distal latencies not only in the median but also prolonged sensory distal peak latencies in both ulnar nerves.  Lower extremity EMG study was not done.  Patient is referred to me as despite carpal tunnel treatment both through conservative measures and surgery her symptoms have persisted.  States that the paresthesias are not getting worse.  She denies significant muscle weakness in the hands.  She denies paresthesias tingling numbness in her feet.  She denies any gait or balance difficulties.  She denies significant neck pain or radicular pain.  There is no history of significant neck injury.  Past neurological history significant for left eye vision  loss in 2020 which was due to the central retinal artery occlusion.  She was seen by me at that time neurovascular work-up was unyielding.  She has been on aspirin since then tolerating well without side effects.  Her blood pressure is usually well controlled today it is elevated in office at 161/63.  She remains on Lipitor 40 mg which is tolerating well without side effects.  She is in no recurrent stroke or TIA symptoms.  ROS:   14 system review of systems is positive for tingling, numbness, vision loss all other systems negative. PMH:  Past Medical History:  Diagnosis Date   AIN III (anal intraepithelial neoplasia III) 06/26/2015   AIN II-III of internal hemorrhoids   Aortic atherosclerosis (HCC)    Chronic diarrhea    COPD  GOLD 0 / active smoker  10/24/2015   Diverticulitis    Diverticulosis    Esophageal stricture    Heart murmur    Hemorrhoids    Hyperglycemia 05/01/2013   Hypertension    Ischemic optic neuropathy, left eye 05/25/2019   Pneumonia 05/01/2013   PVD (peripheral vascular disease) ([place])    Urine incontinence     Social History:  Social History   Socioeconomic History   Marital status: Married    Spouse name: Not on file   Number of children: Not on file   Years of education: 12   Highest education level: Not on file  Occupational History   Occupation: retired  Tobacco Use   Smoking status: Every Day  Packs/day: 1.00    Years: 62.00    Pack years: 62.00    Types: Cigarettes   Smokeless tobacco: Never  Vaping Use   Vaping Use: Never used  Substance and Sexual Activity   Alcohol use: Yes    Comment: pt reports drinking 1 pint  per week   Drug use: No   Sexual activity: Not Currently  Other Topics Concern   Not on file  Social History Narrative   Lives alone   Right handed   Drinks 2-3 cups caffeine daily   Social Determinants of Health   Financial Resource Strain: Not on file  Food Insecurity: Not on file  Transportation Needs: Not on file   Physical Activity: Not on file  Stress: Not on file  Social Connections: Not on file  Intimate Partner Violence: Not on file    Medications:   Current Outpatient Medications on File Prior to Visit  Medication Sig Dispense Refill   albuterol (VENTOLIN HFA) 108 (90 Base) MCG/ACT inhaler TAKE 2 PUFFS BY MOUTH EVERY 6 HOURS AS NEEDED FOR WHEEZE OR SHORTNESS OF BREATH 6.7 each 2   aspirin 81 MG EC tablet Take 1 tablet (81 mg total) by mouth daily. Swallow whole. 30 tablet 12   atorvastatin (LIPITOR) 40 MG tablet Take 1 tablet (40 mg total) by mouth daily at 6 PM. 90 tablet 3   bisacodyl (DULCOLAX) 5 MG EC tablet Take 5 mg by mouth daily as needed for moderate constipation. Patient states she takes 6 a day when she is constipated     clopidogrel (PLAVIX) 75 MG tablet Take 1 tablet (75 mg total) by mouth daily. 90 tablet 3   losartan (COZAAR) 50 MG tablet Take 1 tablet (50 mg total) by mouth daily. 90 tablet 3   meloxicam (MOBIC) 7.5 MG tablet Take 1 tablet (7.5 mg total) by mouth daily as needed for pain. 90 tablet 1   NIFEdipine (PROCARDIA XL/NIFEDICAL-XL) 90 MG 24 hr tablet Take 1 tablet (90 mg total) by mouth daily. 90 tablet 3   omeprazole (PRILOSEC) 20 MG capsule Take 1 capsule (20 mg total) by mouth daily. 90 capsule 3   tiZANidine (ZANAFLEX) 2 MG tablet Take 1 tablet (2 mg total) by mouth every 6 (six) hours as needed for muscle spasms. 30 tablet 2   traMADol (ULTRAM) 50 MG tablet Take 1 tablet (50 mg total) by mouth 3 (three) times daily as needed. 60 tablet 2   traZODone (DESYREL) 50 MG tablet Take 0.5-1 tablets (25-50 mg total) by mouth at bedtime as needed for sleep. 90 tablet 1   [DISCONTINUED] citalopram (CELEXA) 10 MG tablet Take 1 tablet (10 mg total) by mouth daily. 90 tablet 3   [DISCONTINUED] fluticasone (FLONASE) 50 MCG/ACT nasal spray Place 2 sprays into both nostrils daily. (Patient taking differently: Place 2 sprays into both nostrils as needed for rhinitis. ) 16 g 6   No  current facility-administered medications on file prior to visit.    Allergies:   Allergies  Allergen Reactions   Ace Inhibitors Cough   Levaquin [Levofloxacin] Other (See Comments)    GI upset    Physical Exam General: Frail elderly African-American lady, seated, in no evident distress Head: head normocephalic and atraumatic.   Neck: supple with no carotid or supraclavicular bruits Cardiovascular: regular rate and rhythm, no murmurs Musculoskeletal: no deformity Skin:  no rash/petichiae Vascular:  Normal pulses all extremities  Neurologic Exam Mental Status: Awake and fully alert. Oriented to place and time.  Recent and remote memory intact. Attention span, concentration and fund of knowledge appropriate. Mood and affect appropriate.  Cranial Nerves: Fundoscopic exam reveals sharp disc margins. Pupils equal, briskly reactive to light. Extraocular movements full without nystagmus. Visual fields full to confrontation. Hearing intact. Facial sensation intact. Face, tongue, palate moves normally and symmetrically.  Motor: Normal bulk and tone. Normal strength in all tested extremity muscles. Sensory.: intact to touch , pinprick , position and vibratory sensation but subjective paresthesias of her fingertips with hyperesthesias over all the fingers pulps bilaterally.  Coordination: Rapid alternating movements normal in all extremities. Finger-to-nose and heel-to-shin performed accurately bilaterally. Gait and Station: Arises from chair without difficulty. Stance is normal. Gait demonstrates normal stride length and balance . Able to heel, toe and tandem walk without difficulty.  Reflexes: 1+ and symmetric. Toes downgoing.       ASSESSMENT: 75 year old lady with 1 year history of bilateral hand paresthesias of unclear etiology.  Possibilities include peripheral neuropathy versus bilateral cervical radiculopathy.  Prior history of left eye vision loss due to central retinal artery occlusion  in May 2020.  Vascular risk factors of hypertension hyperlipidemia age and cerebrovascular disease.     PLAN: I had a long discussion with the patient regards to her chronic bilateral hand paresthesias discussed differential diagnosis and plan for further work-up and answered questions.  I recommended treatment trial of Topamax 25 mg twice daily to be increased if tolerated without side effects.  Check MRI scan cervical spine to rule out any compressive lesions as well as check EMG nerve conduction study for any underlying neuropathy.  Check neuropathy panel labs.  Continue aspirin for stroke prevention given her prior history of central retinal artery occlusion and maintain aggressive risk factor modification.  Return for follow-up in 2 months or call earlier if necessary.  Greater than 50% time during this 45-minute consultation visit was spent on counseling and coordination of care about her paresthesias and history of central retinal artery occlusion and answering questions Antony Contras, MD  Note: This document was prepared with digital dictation and possible smart phrase technology. Any transcriptional errors that result from this process are unintentional.

## 2021-09-17 ENCOUNTER — Telehealth: Payer: Self-pay | Admitting: *Deleted

## 2021-09-17 NOTE — Telephone Encounter (Signed)
I spoke to the patient. She verbalized understanding of the findings. She is agreeable to follow up with her PCP for further evaluation of vitamin B12 and D levels.

## 2021-09-17 NOTE — Telephone Encounter (Signed)
-----   Message from Garvin Fila, MD sent at 09/17/2021  4:56 PM EDT ----- Mitchell Heir inform the patient that vitamin B levels are borderline low and vitamin D level is low and to see primary care physician for evaluation and treatment for the same.  Lab work for lupus is negative. ----- Message ----- From: Lavone Neri Lab Results In Sent: 09/17/2021   7:37 AM EDT To: Garvin Fila, MD

## 2021-09-17 NOTE — Progress Notes (Signed)
Carelink Summary Report / Loop Recorder 

## 2021-09-18 ENCOUNTER — Ambulatory Visit: Payer: Medicare Other | Admitting: Internal Medicine

## 2021-09-19 LAB — NEUROPATHY PANEL
A/G Ratio: 1.3 (ref 0.7–1.7)
Albumin ELP: 3.7 g/dL (ref 2.9–4.4)
Alpha 1: 0.2 g/dL (ref 0.0–0.4)
Alpha 2: 0.8 g/dL (ref 0.4–1.0)
Angio Convert Enzyme: 21 U/L (ref 14–82)
Anti Nuclear Antibody (ANA): NEGATIVE
Beta: 1 g/dL (ref 0.7–1.3)
Gamma Globulin: 0.8 g/dL (ref 0.4–1.8)
Globulin, Total: 2.9 g/dL (ref 2.2–3.9)
Rheumatoid fact SerPl-aCnc: 10.1 IU/mL (ref <14.0)
Sed Rate: 9 mm/hr (ref 0–40)
TSH: 0.495 u[IU]/mL (ref 0.450–4.500)
Total Protein: 6.6 g/dL (ref 6.0–8.5)
Vit D, 25-Hydroxy: 14 ng/mL — ABNORMAL LOW (ref 30.0–100.0)
Vitamin B-12: 230 pg/mL — ABNORMAL LOW (ref 232–1245)

## 2021-09-19 LAB — HEMOGLOBIN A1C
Est. average glucose Bld gHb Est-mCnc: 117 mg/dL
Hgb A1c MFr Bld: 5.7 % — ABNORMAL HIGH (ref 4.8–5.6)

## 2021-09-22 ENCOUNTER — Telehealth: Payer: Self-pay | Admitting: *Deleted

## 2021-09-22 NOTE — Telephone Encounter (Signed)
-----   Message from Garvin Fila, MD sent at 09/19/2021  5:16 PM EDT ----- Mitchell Heir inform the patient that vitamin D levels are quite low and vitamin B12 level is also slightly low and he needs to see his primary care physician Dr. Cathlean Cower to discuss treatment for the same.  And we will send a copy of the report to his primary physician ----- Message ----- From: Interface, [facility] Lab Results In Sent: 09/17/2021   7:37 AM EDT To: Garvin Fila, MD

## 2021-09-22 NOTE — Telephone Encounter (Signed)
I spoke to the patient. She verbalized understanding of the findings. She will contact her PCP for further discussion of treatment.

## 2021-09-30 ENCOUNTER — Ambulatory Visit
Admission: RE | Admit: 2021-09-30 | Discharge: 2021-09-30 | Disposition: A | Discharge status: Home / Self Care | Payer: Medicare Other | Source: Ambulatory Visit | Attending: Neurology | Admitting: Neurology

## 2021-09-30 ENCOUNTER — Other Ambulatory Visit: Payer: Self-pay

## 2021-09-30 DIAGNOSIS — R202 Paresthesia of skin: Secondary | ICD-10-CM | POA: Diagnosis not present

## 2021-09-30 MED ORDER — GADOBENATE DIMEGLUMINE 529 MG/ML IV SOLN
10.0000 mL | Freq: Once | INTRAVENOUS | Status: AC | PRN
  Administered 2021-09-30: 10 mL via INTRAVENOUS

## 2021-10-06 DIAGNOSIS — M5412 Radiculopathy, cervical region: Secondary | ICD-10-CM | POA: Diagnosis not present

## 2021-10-14 ENCOUNTER — Other Ambulatory Visit: Payer: Self-pay | Admitting: Neurology

## 2021-10-14 ENCOUNTER — Telehealth: Payer: Self-pay

## 2021-10-14 DIAGNOSIS — M47812 Spondylosis without myelopathy or radiculopathy, cervical region: Secondary | ICD-10-CM

## 2021-10-14 NOTE — Telephone Encounter (Signed)
-----   Message from Garvin Fila, MD sent at 10/14/2021  1:04 PM EDT ----- I tried calling the patient.  The only phone number I got in the chart was her daughters.  I left a message on her answering machine to call me back to discuss results of the MRI scan of the cervical spine on her mother. ----- Message ----- From: Garvin Fila, MD Sent: 10/02/2021   5:03 PM EDT To: Garvin Fila, MD

## 2021-10-14 NOTE — Telephone Encounter (Signed)
Dr. Leonie Man was able to review this result with the patient directly (over the phone). She was agreeable to a referral to neurosurgery. He has placed the request in Epic.

## 2021-10-14 NOTE — Telephone Encounter (Signed)
Pt returned call. Please call back. [phone]

## 2021-10-15 ENCOUNTER — Ambulatory Visit (INDEPENDENT_AMBULATORY_CARE_PROVIDER_SITE_OTHER): Payer: Medicare Other

## 2021-10-15 ENCOUNTER — Telehealth: Payer: Self-pay | Admitting: Neurology

## 2021-10-15 DIAGNOSIS — I639 Cerebral infarction, unspecified: Secondary | ICD-10-CM

## 2021-10-15 NOTE — Telephone Encounter (Signed)
Sent to  Neurosurgery ph # [phone]. 

## 2021-10-16 LAB — CUP PACEART REMOTE DEVICE CHECK
Date Time Interrogation Session: 20221020000500
Implantable Pulse Generator Implant Date: 20200528

## 2021-10-20 NOTE — Progress Notes (Signed)
Carelink Summary Report / Loop Recorder 

## 2021-10-31 ENCOUNTER — Encounter: Payer: Self-pay | Admitting: Nurse Practitioner

## 2021-10-31 ENCOUNTER — Ambulatory Visit (INDEPENDENT_AMBULATORY_CARE_PROVIDER_SITE_OTHER): Payer: Medicare Other | Admitting: Nurse Practitioner

## 2021-10-31 ENCOUNTER — Other Ambulatory Visit (INDEPENDENT_AMBULATORY_CARE_PROVIDER_SITE_OTHER): Payer: Medicare Other

## 2021-10-31 VITALS — BP 130/70 | HR 85 | Ht 62.0 in | Wt 114.0 lb

## 2021-10-31 DIAGNOSIS — R1032 Left lower quadrant pain: Secondary | ICD-10-CM

## 2021-10-31 DIAGNOSIS — G8929 Other chronic pain: Secondary | ICD-10-CM

## 2021-10-31 DIAGNOSIS — K5909 Other constipation: Secondary | ICD-10-CM

## 2021-10-31 DIAGNOSIS — D509 Iron deficiency anemia, unspecified: Secondary | ICD-10-CM

## 2021-10-31 LAB — CBC WITH DIFFERENTIAL/PLATELET
Basophils Absolute: 0.1 10*3/uL (ref 0.0–0.1)
Basophils Relative: 1 % (ref 0.0–3.0)
Eosinophils Absolute: 0.2 10*3/uL (ref 0.0–0.7)
Eosinophils Relative: 2.1 % (ref 0.0–5.0)
HCT: 33 % — ABNORMAL LOW (ref 36.0–46.0)
Hemoglobin: 10.3 g/dL — ABNORMAL LOW (ref 12.0–15.0)
Lymphocytes Relative: 16.1 % (ref 12.0–46.0)
Lymphs Abs: 1.6 10*3/uL (ref 0.7–4.0)
MCHC: 31.2 g/dL (ref 30.0–36.0)
MCV: 74.4 fl — ABNORMAL LOW (ref 78.0–100.0)
Monocytes Absolute: 0.6 10*3/uL (ref 0.1–1.0)
Monocytes Relative: 5.8 % (ref 3.0–12.0)
Neutro Abs: 7.6 10*3/uL (ref 1.4–7.7)
Neutrophils Relative %: 75 % (ref 43.0–77.0)
Platelets: 407 10*3/uL — ABNORMAL HIGH (ref 150.0–400.0)
RBC: 4.44 Mil/uL (ref 3.87–5.11)
RDW: 17.2 % — ABNORMAL HIGH (ref 11.5–15.5)
WBC: 10.2 10*3/uL (ref 4.0–10.5)

## 2021-10-31 LAB — FERRITIN: Ferritin: 69.3 ng/mL (ref 10.0–291.0)

## 2021-10-31 NOTE — Progress Notes (Signed)
Assessment and plan noted ?

## 2021-10-31 NOTE — Progress Notes (Signed)
ASSESSMENT AND PLAN    # 75 year old female with chronic abdominal pain and chronic constipation here with recurrent symptoms . She only takes Dulcolax as needed, not on a daily regimen for constipation.  She doesn't drink water, just coffee and soda. She doesn't eat much fiber. Stools are hard, infrequent and she never feels evacuated. She is non-tender on exam --Increase water intake to at least 48 oz daily ( she can add flavor drops).  --Start daiy fiber. Since she doesn't care for water will try FiberCon capsules --Stool softener , take 2 Q HS.   --Follow up with me in 3 weeks. If not better will add daily Miralax --Weight down slightly. Will recheck when I see her in follow up  # Chronic microcytic anemia. EGD / colonoscopy November 2021 were unreveaing.  --Update CBC, check iron studies. If iron deficient she may need IV iron. With her history of chronic constipation she may not able to tolerate PO   HISTORY OF PRESENT ILLNESS    Chief Complaint : constipation , lower abdominal pain   Elaine Turner is a 75 y.o. female known to Dr. Henrene Pastor  with a past medical history of diverticulitis, chronic constipation, tobacco abuse, COPD, PVD, CVA on chronic Plavix, CKD. Additional medical history as listed in [place] .   Elaine Turner has been seen 2 or 3 times over the last couple of years for evaluation of mid abdominal pain, constipation and chronic anemia. She was last seen November 2021 by Dr. Henrene Pastor for further evaluation of these symptoms in addition to weight loss. She subsequently underwent EGD / colonoscopy and CT scan and all were unrevealing  Elaine Turner comes in today with the same set of symptoms.  She feels significantly constipated.  She is not taking anything for constipation on daily basis. Her stools are always hard leading to straining. She takes 3 tabs Dulcolax as needed which gives her temporary relief.   Bowel movements are infrequent and evacuation incomplete . She has taken  Miralax in the past but says she never took it on a daily basis. She drinks coffee and soda, no water except for sips with meds. She is having recurrent LLQ  / periumbilical discomfort which resolves if able to have a good BM. Bending exacerbates the pain. Her appetite is still poor, She has early satiety.  No nausea or vomiting. Weight over the last several months has been around 118 currently down to 114  pounds  TSH Sept 2022 normal  No GERD symptoms. She is still taking daily Omeprazole. EGD in November 2021 >> mild esophagitis   PREVIOUS ENDOSCOPIC EVALUATIONS / PERTINENT STUDIES:   EGD November 2021 IDA and abdominal pain Small hiatal hernia, mild esophagitis  Colonoscopy November 2021 for abdominal pain, constipation and IDA --diverticulosis  CTAP w/ contrast for LLQ pain and weight loss Colonic diverticulosis, without radiographic evidence of diverticulitis or other acute findings.    Current Medications, Allergies, Past Medical History, Past Surgical History, Family History and Social History were reviewed in [employer] record.     Current Outpatient Medications  Medication Sig Dispense Refill   albuterol (VENTOLIN HFA) 108 (90 Base) MCG/ACT inhaler TAKE 2 PUFFS BY MOUTH EVERY 6 HOURS AS NEEDED FOR WHEEZE OR SHORTNESS OF BREATH 6.7 each 2   aspirin 81 MG EC tablet Take 1 tablet (81 mg total) by mouth daily. Swallow whole. 30 tablet 12   atorvastatin (LIPITOR) 40 MG tablet Take 1 tablet (40  mg total) by mouth daily at 6 PM. 90 tablet 3   bisacodyl (DULCOLAX) 5 MG EC tablet Take 5 mg by mouth daily as needed for moderate constipation. Patient states she takes 6 a day when she is constipated     clopidogrel (PLAVIX) 75 MG tablet Take 1 tablet (75 mg total) by mouth daily. 90 tablet 3   losartan (COZAAR) 50 MG tablet Take 1 tablet (50 mg total) by mouth daily. 90 tablet 3   meloxicam (MOBIC) 7.5 MG tablet Take 1 tablet (7.5 mg total) by mouth daily as  needed for pain. 90 tablet 1   NIFEdipine (PROCARDIA XL/NIFEDICAL-XL) 90 MG 24 hr tablet Take 1 tablet (90 mg total) by mouth daily. 90 tablet 3   omeprazole (PRILOSEC) 20 MG capsule Take 1 capsule (20 mg total) by mouth daily. 90 capsule 3   tiZANidine (ZANAFLEX) 2 MG tablet Take 1 tablet (2 mg total) by mouth every 6 (six) hours as needed for muscle spasms. 30 tablet 2   topiramate (TOPAMAX) 25 MG tablet Take 1 tablet (25 mg total) by mouth 2 (two) times daily. 120 tablet 3   traMADol (ULTRAM) 50 MG tablet Take 1 tablet (50 mg total) by mouth 3 (three) times daily as needed. 60 tablet 2   traZODone (DESYREL) 50 MG tablet Take 0.5-1 tablets (25-50 mg total) by mouth at bedtime as needed for sleep. 90 tablet 1   No current facility-administered medications for this visit.    Review of Systems: No chest pain. No shortness of breath. No urinary complaints.   PHYSICAL EXAM :    Wt Readings from Last 3 Encounters:  10/31/21 114 lb (51.7 kg)  09/16/21 119 lb 6.4 oz (54.2 kg)  08/18/21 117 lb 3.2 oz (53.2 kg)    BP 130/70   Pulse 85   Ht 5\' 2"  (1.575 m)   Wt 114 lb (51.7 kg)   BMI 20.85 kg/m  Constitutional:  Generally well appearing but thin female in no acute distress. Psychiatric: Pleasant. Normal mood and affect. Behavior is normal. EENT: Pupils normal.  Conjunctivae are normal. No scleral icterus. Neck supple.  Cardiovascular: Normal rate. No edema Pulmonary/chest: Effort normal and breath sounds normal. No wheezing, rales or rhonchi. Abdominal: Soft, nondistended, nontender. Bowel sounds active throughout. There are no masses palpable. No hepatomegaly. Neurological: Alert and oriented to person place and time. Skin: Skin is warm and dry. No rashes noted.  The risks and benefits of EGD with possible biopsies were discussed with the patient who agrees to proceed.    Tye Savoy, NP  10/31/2021, 11:16 AM

## 2021-10-31 NOTE — Patient Instructions (Addendum)
LABS:  Lab work has been ordered for you today. Our lab is located in the basement. Press "B" on the elevator. The lab is located at the first door on the left as you exit the elevator.   RECOMMENDATIONS: Drink at least 40 ounces of water daily. Purchase over the counter stool softener and take 2 at night before bed. Take Fibercon tablets as directed on the bottle.  We have scheduled you a follow up with Tye Savoy, NP on 11/27/21 at 2:30 pm.  It was great seeing you today! Thank you for entrusting me with your care and choosing [facility].  Tye Savoy, NP

## 2021-11-01 LAB — IRON AND TIBC
Iron Saturation: 15 % (ref 15–55)
Iron: 51 ug/dL (ref 27–139)
Total Iron Binding Capacity: 335 ug/dL (ref 250–450)
UIBC: 284 ug/dL (ref 118–369)

## 2021-11-06 ENCOUNTER — Encounter: Payer: Medicare Other | Admitting: Diagnostic Neuroimaging

## 2021-11-17 ENCOUNTER — Ambulatory Visit (INDEPENDENT_AMBULATORY_CARE_PROVIDER_SITE_OTHER): Payer: Medicare Other

## 2021-11-17 DIAGNOSIS — I639 Cerebral infarction, unspecified: Secondary | ICD-10-CM

## 2021-11-17 LAB — CUP PACEART REMOTE DEVICE CHECK
Date Time Interrogation Session: 20221114235515
Implantable Pulse Generator Implant Date: 20200528

## 2021-11-24 ENCOUNTER — Telehealth: Payer: Self-pay

## 2021-11-24 NOTE — Telephone Encounter (Signed)
Patient advised of her stable iron studies. Reminded of return appointment on 11/27/21 at 2:30 pm.

## 2021-11-25 NOTE — Progress Notes (Signed)
Carelink Summary Report / Loop Recorder 

## 2021-11-27 ENCOUNTER — Ambulatory Visit: Payer: Medicare Other | Admitting: Nurse Practitioner

## 2021-11-27 ENCOUNTER — Ambulatory Visit: Payer: Medicare Other | Admitting: Internal Medicine

## 2021-12-18 LAB — CUP PACEART REMOTE DEVICE CHECK
Date Time Interrogation Session: 20221217235927
Implantable Pulse Generator Implant Date: 20200528

## 2021-12-23 ENCOUNTER — Ambulatory Visit (INDEPENDENT_AMBULATORY_CARE_PROVIDER_SITE_OTHER): Payer: Medicare Other

## 2021-12-23 DIAGNOSIS — I639 Cerebral infarction, unspecified: Secondary | ICD-10-CM

## 2021-12-31 NOTE — Progress Notes (Signed)
Carelink Summary Report / Loop Recorder 

## 2022-01-01 ENCOUNTER — Encounter: Payer: Medicare Other | Admitting: Diagnostic Neuroimaging

## 2022-01-02 ENCOUNTER — Encounter: Payer: Self-pay | Admitting: Diagnostic Neuroimaging

## 2022-01-08 ENCOUNTER — Ambulatory Visit: Payer: Medicare Other | Admitting: Neurology

## 2022-01-08 ENCOUNTER — Encounter: Payer: Self-pay | Admitting: Neurology

## 2022-01-14 ENCOUNTER — Ambulatory Visit: Payer: Medicare Other | Admitting: Nurse Practitioner

## 2022-01-19 ENCOUNTER — Ambulatory Visit (INDEPENDENT_AMBULATORY_CARE_PROVIDER_SITE_OTHER): Payer: Medicare Other

## 2022-01-19 ENCOUNTER — Ambulatory Visit (INDEPENDENT_AMBULATORY_CARE_PROVIDER_SITE_OTHER): Payer: Medicare Other | Admitting: Internal Medicine

## 2022-01-19 ENCOUNTER — Other Ambulatory Visit: Payer: Self-pay

## 2022-01-19 ENCOUNTER — Encounter: Payer: Self-pay | Admitting: Internal Medicine

## 2022-01-19 VITALS — BP 140/78 | HR 85 | Temp 98.5°F | Ht 62.0 in | Wt 119.0 lb

## 2022-01-19 DIAGNOSIS — E611 Iron deficiency: Secondary | ICD-10-CM | POA: Diagnosis not present

## 2022-01-19 DIAGNOSIS — E538 Deficiency of other specified B group vitamins: Secondary | ICD-10-CM

## 2022-01-19 DIAGNOSIS — J449 Chronic obstructive pulmonary disease, unspecified: Secondary | ICD-10-CM

## 2022-01-19 DIAGNOSIS — F17200 Nicotine dependence, unspecified, uncomplicated: Secondary | ICD-10-CM | POA: Diagnosis not present

## 2022-01-19 DIAGNOSIS — I517 Cardiomegaly: Secondary | ICD-10-CM | POA: Diagnosis not present

## 2022-01-19 DIAGNOSIS — R634 Abnormal weight loss: Secondary | ICD-10-CM

## 2022-01-19 DIAGNOSIS — N1832 Chronic kidney disease, stage 3b: Secondary | ICD-10-CM

## 2022-01-19 DIAGNOSIS — R1032 Left lower quadrant pain: Secondary | ICD-10-CM

## 2022-01-19 DIAGNOSIS — R739 Hyperglycemia, unspecified: Secondary | ICD-10-CM

## 2022-01-19 DIAGNOSIS — I10 Essential (primary) hypertension: Secondary | ICD-10-CM | POA: Diagnosis not present

## 2022-01-19 DIAGNOSIS — E559 Vitamin D deficiency, unspecified: Secondary | ICD-10-CM

## 2022-01-19 LAB — CBC WITH DIFFERENTIAL/PLATELET
Basophils Absolute: 0.1 10*3/uL (ref 0.0–0.1)
Basophils Relative: 1.1 % (ref 0.0–3.0)
Eosinophils Absolute: 0.2 10*3/uL (ref 0.0–0.7)
Eosinophils Relative: 1.9 % (ref 0.0–5.0)
HCT: 34.5 % — ABNORMAL LOW (ref 36.0–46.0)
Hemoglobin: 10.9 g/dL — ABNORMAL LOW (ref 12.0–15.0)
Lymphocytes Relative: 15.8 % (ref 12.0–46.0)
Lymphs Abs: 1.6 10*3/uL (ref 0.7–4.0)
MCHC: 31.5 g/dL (ref 30.0–36.0)
MCV: 73.2 fl — ABNORMAL LOW (ref 78.0–100.0)
Monocytes Absolute: 0.6 10*3/uL (ref 0.1–1.0)
Monocytes Relative: 5.7 % (ref 3.0–12.0)
Neutro Abs: 7.6 10*3/uL (ref 1.4–7.7)
Neutrophils Relative %: 75.5 % (ref 43.0–77.0)
Platelets: 457 10*3/uL — ABNORMAL HIGH (ref 150.0–400.0)
RBC: 4.72 Mil/uL (ref 3.87–5.11)
RDW: 17.9 % — ABNORMAL HIGH (ref 11.5–15.5)
WBC: 10.1 10*3/uL (ref 4.0–10.5)

## 2022-01-19 LAB — URINALYSIS, ROUTINE W REFLEX MICROSCOPIC
Bilirubin Urine: NEGATIVE
Hgb urine dipstick: NEGATIVE
Ketones, ur: NEGATIVE
Leukocytes,Ua: NEGATIVE
Nitrite: NEGATIVE
RBC / HPF: NONE SEEN (ref 0–?)
Specific Gravity, Urine: 1.025 (ref 1.000–1.030)
Total Protein, Urine: 100 — AB
Urine Glucose: NEGATIVE
Urobilinogen, UA: 0.2 (ref 0.0–1.0)
pH: 5.5 (ref 5.0–8.0)

## 2022-01-19 LAB — FERRITIN: Ferritin: 67.3 ng/mL (ref 10.0–291.0)

## 2022-01-19 LAB — BASIC METABOLIC PANEL
BUN: 24 mg/dL — ABNORMAL HIGH (ref 6–23)
CO2: 24 mEq/L (ref 19–32)
Calcium: 9.5 mg/dL (ref 8.4–10.5)
Chloride: 104 mEq/L (ref 96–112)
Creatinine, Ser: 1.36 mg/dL — ABNORMAL HIGH (ref 0.40–1.20)
GFR: 37.46 mL/min — ABNORMAL LOW (ref >60.00)
Glucose, Bld: 85 mg/dL (ref 70–99)
Potassium: 3.7 mEq/L (ref 3.5–5.1)
Sodium: 139 mEq/L (ref 135–145)

## 2022-01-19 LAB — VITAMIN B12: Vitamin B-12: 172 pg/mL — ABNORMAL LOW (ref 211–911)

## 2022-01-19 LAB — HEPATIC FUNCTION PANEL
ALT: 16 U/L (ref 0–35)
AST: 22 U/L (ref 0–37)
Albumin: 4.3 g/dL (ref 3.5–5.2)
Alkaline Phosphatase: 68 U/L (ref 39–117)
Bilirubin, Direct: 0.1 mg/dL (ref 0.0–0.3)
Total Bilirubin: 0.5 mg/dL (ref 0.2–1.2)
Total Protein: 7.5 g/dL (ref 6.0–8.3)

## 2022-01-19 LAB — LIPID PANEL
Cholesterol: 185 mg/dL (ref 0–200)
HDL: 88.8 mg/dL (ref >39.00)
LDL Cholesterol: 79 mg/dL (ref 0–99)
NonHDL: 96.21
Total CHOL/HDL Ratio: 2
Triglycerides: 87 mg/dL (ref 0.0–149.0)
VLDL: 17.4 mg/dL (ref 0.0–40.0)

## 2022-01-19 LAB — TSH: TSH: 0.47 u[IU]/mL (ref 0.35–5.50)

## 2022-01-19 LAB — IBC PANEL
Iron: 106 ug/dL (ref 42–145)
Saturation Ratios: 24.4 % (ref 20.0–50.0)
TIBC: 434 ug/dL (ref 250.0–450.0)
Transferrin: 310 mg/dL (ref 212.0–360.0)

## 2022-01-19 LAB — VITAMIN D 25 HYDROXY (VIT D DEFICIENCY, FRACTURES): VITD: 20.02 ng/mL — ABNORMAL LOW (ref 30.00–100.00)

## 2022-01-19 IMAGING — DX DG CHEST 2V
2 series · 2 of 2 positions shown · non-contrast
Comparison: Chest radiograph dated [DATE].

CLINICAL DATA: COPD and weight loss.  Smoking.

EXAM:
CHEST - 2 VIEW

[chest pa]
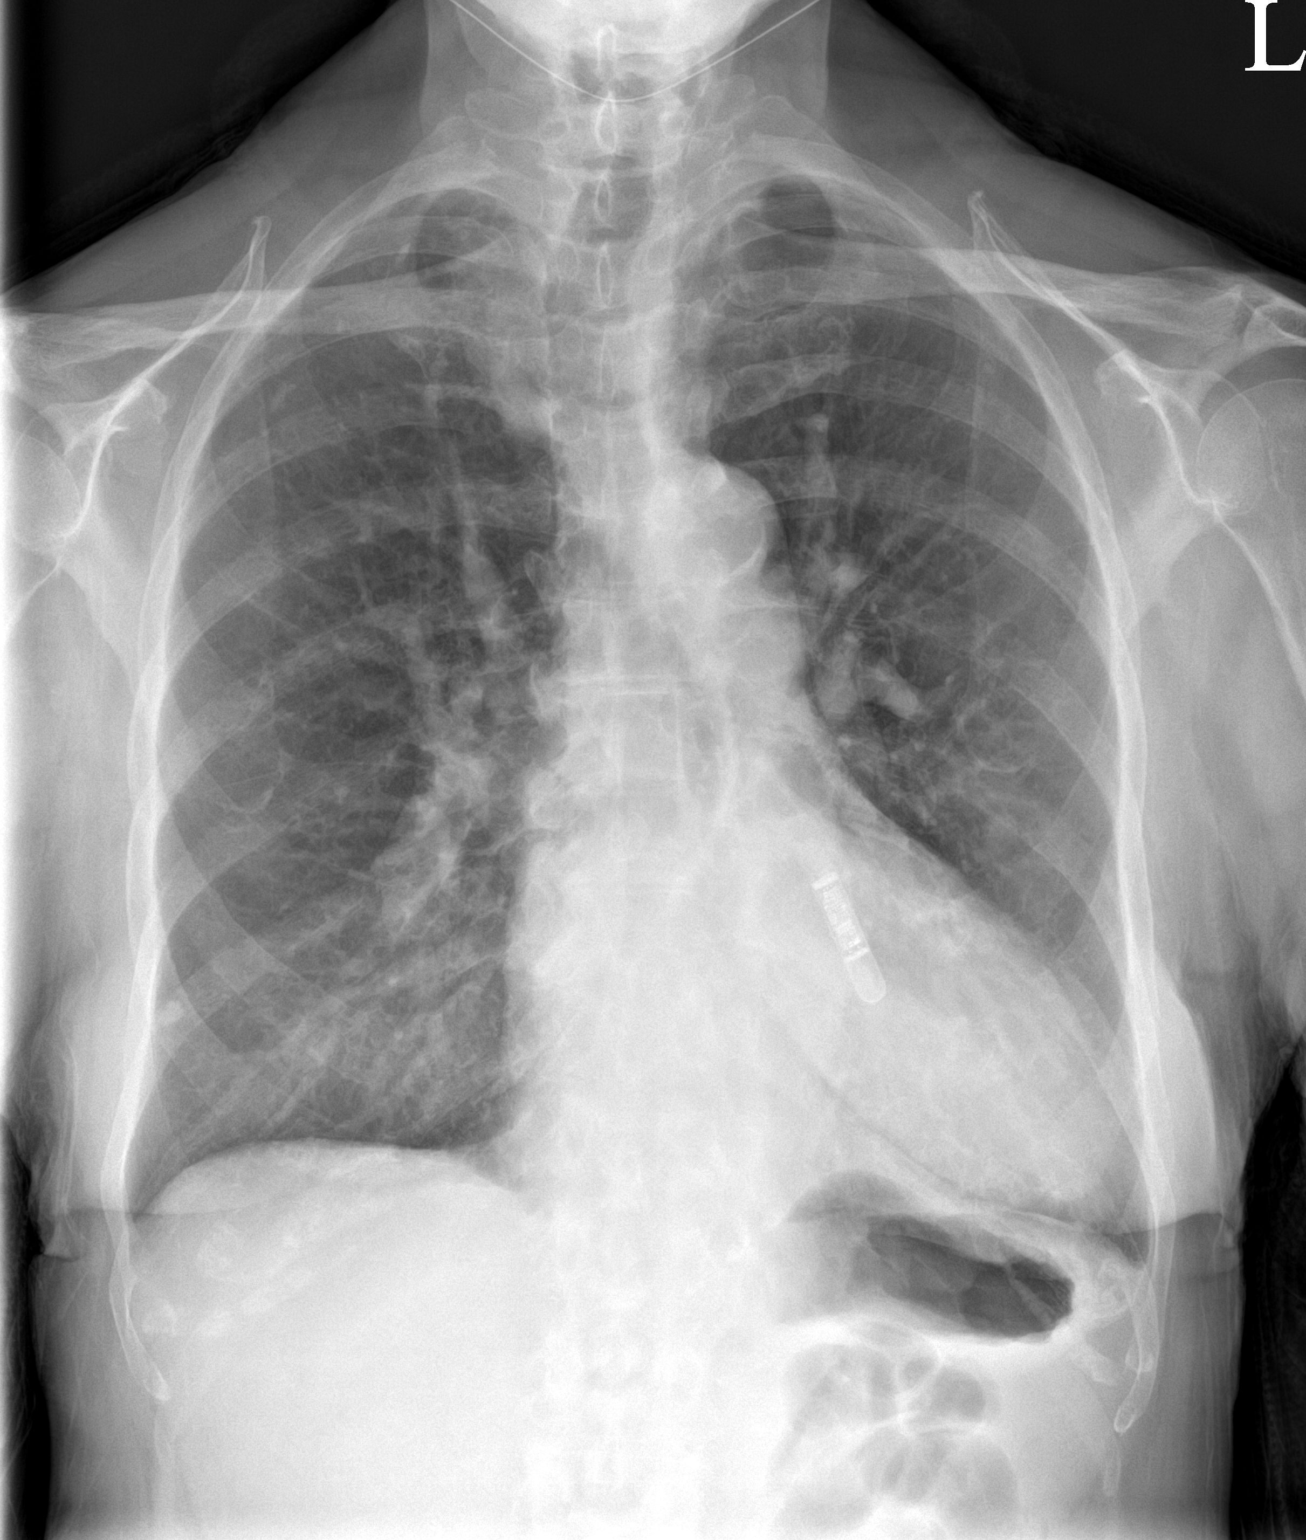

[chest lat]
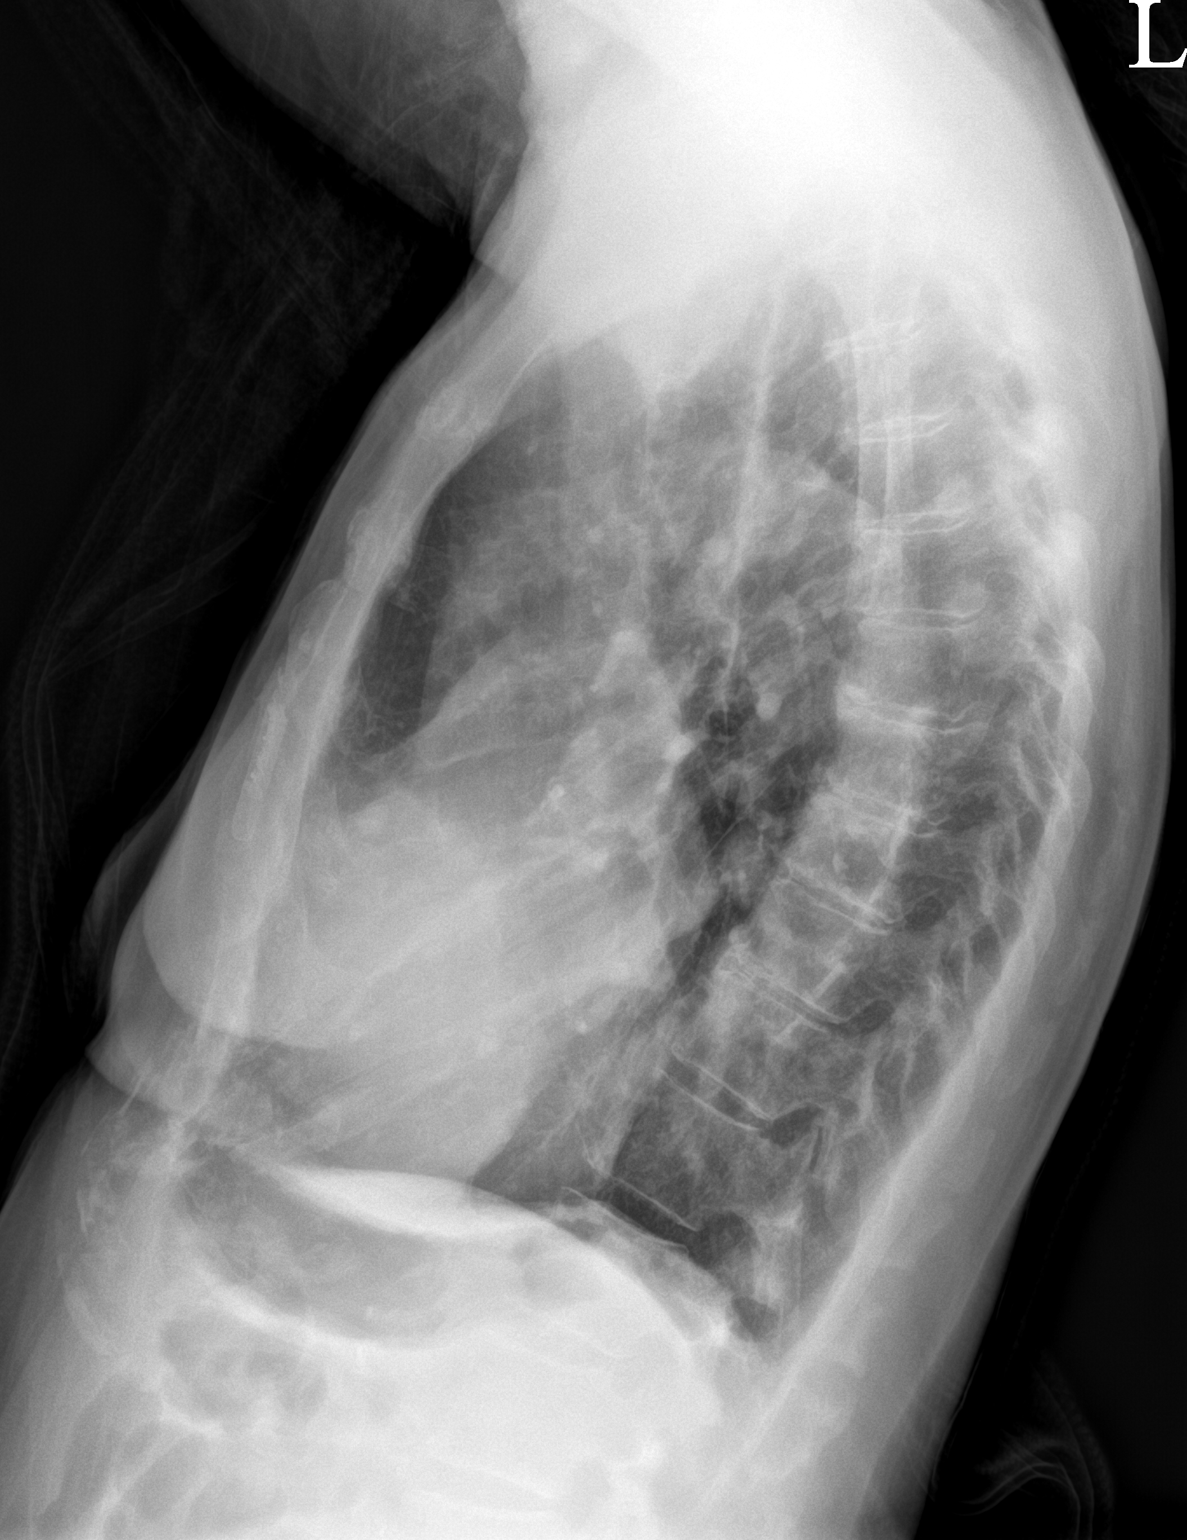

[2 of 2 positions shown; findings below may reference images not displayed]

FINDINGS: No focal consolidation, pleural effusion or pneumothorax. Calcified
nodule over the right lower lung field corresponds to the
calcification of the chest wall seen on the CT of [DATE]. Mild
cardiomegaly. There is mild vascular congestion. Loop recorder
device. Atherosclerotic calcification of the aorta. No acute osseous
pathology.
IMPRESSION: Mild cardiomegaly with mild vascular congestion. No focal
consolidation.

## 2022-01-19 MED ORDER — TOPIRAMATE 25 MG PO TABS: 25.0000 mg | ORAL_TABLET | Freq: Two times a day (BID) | ORAL | 1 refills | Status: AC

## 2022-01-19 MED ORDER — TIZANIDINE HCL 2 MG PO TABS: 2.0000 mg | ORAL_TABLET | Freq: Four times a day (QID) | ORAL | 2 refills | Status: DC | PRN

## 2022-01-19 MED ORDER — TRAMADOL HCL 50 MG PO TABS: 50.0000 mg | ORAL_TABLET | Freq: Three times a day (TID) | ORAL | 2 refills | Status: DC | PRN

## 2022-01-19 MED ORDER — MELOXICAM 7.5 MG PO TABS: 7.5000 mg | ORAL_TABLET | Freq: Every day | ORAL | 1 refills | Status: DC | PRN

## 2022-01-19 MED ORDER — TRAZODONE HCL 50 MG PO TABS: 25.0000 mg | ORAL_TABLET | Freq: Every evening | ORAL | 1 refills | Status: DC | PRN

## 2022-01-19 NOTE — Patient Instructions (Signed)
Please quit smoking  Please continue all other medications as before, and refills have been done if requested.  Please have the pharmacy call with any other refills you may need.  Please continue your efforts at being more active, low cholesterol diet, and weight control.  You are otherwise up to date with prevention measures today.  Please keep your appointments with your specialists as you may have planned  Please go to the XRAY Department in the first floor for the x-ray testing  Please go to the LAB at the blood drawing area for the tests to be done  You will be contacted by phone if any changes need to be made immediately.  Otherwise, you will receive a letter about your results with an explanation, but please check with MyChart first.  Please remember to sign up for MyChart if you have not done so, as this will be important to you in the future with finding out test results, communicating by private email, and scheduling acute appointments online when needed.  Please make an Appointment to return in 3 months, or sooner if needed

## 2022-01-19 NOTE — Progress Notes (Signed)
Patient ID: Elaine Turner, female   DOB: 03/09/1946, 76 y.o.   MRN: [id number]        Chief Complaint: follow up low b12, low Vit d, smoker, wt loss       HPI:  Elaine Turner is a 76 y.o. female here overall doing ok , still smoking, not ready to quit.  Not taking B12 or Vitamin D.  Pt denies chest pain, increased sob or doe, wheezing, orthopnea, PND, increased LE swelling, palpitations, dizziness or syncope.   Pt denies polydipsia, polyuria, or new focal neuro s/s.   Pt denies fever, night sweats, loss of appetite, or other constitutional symptoms , except Conts to lose wt, peak wt has been 140-150, and documentd 130 in June 2020, now 119.    Wt Readings from Last 3 Encounters:  01/19/22 119 lb (54 kg)  10/31/21 114 lb (51.7 kg)  09/16/21 119 lb 6.4 oz (54.2 kg)   BP Readings from Last 3 Encounters:  01/19/22 140/78  10/31/21 130/70  09/16/21 (!) 161/63         Past Medical History:  Diagnosis Date   AIN III (anal intraepithelial neoplasia III) 06/26/2015   AIN II-III of internal hemorrhoids   Aortic atherosclerosis (HCC)    Chronic diarrhea    COPD  GOLD 0 / active smoker  10/24/2015   Diverticulitis    Diverticulosis    Esophageal stricture    Heart murmur    Hemorrhoids    Hyperglycemia 05/01/2013   Hypertension    Ischemic optic neuropathy, left eye 05/25/2019   Pneumonia 05/01/2013   PVD (peripheral vascular disease) ([place])    Urine incontinence    Past Surgical History:  Procedure Laterality Date   ABDOMINAL HYSTERECTOMY     CARPAL TUNNEL RELEASE Right 09/2019   HEMORRHOID SURGERY N/A 06/26/2015   Procedure: INTERNAL AND EXTERNAL HEMORRHOIDECTOMY;  Surgeon: Georganna Skeans, MD;  Location: [facility];  Service: General;  Laterality: N/A;   LOOP RECORDER INSERTION N/A 05/25/2019   Procedure: LOOP RECORDER INSERTION;  Surgeon: Constance Haw, MD;  Location: [facility];  Service: Cardiovascular;  Laterality: N/A;    reports that she has been  smoking cigarettes. She has a 62.00 pack-year smoking history. She has never used smokeless tobacco. She reports current alcohol use. She reports that she does not use drugs. family history includes Hypertension in her mother; Lung cancer in her father. Allergies  Allergen Reactions   Ace Inhibitors Cough   Levaquin [Levofloxacin] Other (See Comments)    GI upset   Current Outpatient Medications on File Prior to Visit  Medication Sig Dispense Refill   albuterol (VENTOLIN HFA) 108 (90 Base) MCG/ACT inhaler TAKE 2 PUFFS BY MOUTH EVERY 6 HOURS AS NEEDED FOR WHEEZE OR SHORTNESS OF BREATH 6.7 each 2   aspirin 81 MG EC tablet Take 1 tablet (81 mg total) by mouth daily. Swallow whole. 30 tablet 12   atorvastatin (LIPITOR) 40 MG tablet Take 1 tablet (40 mg total) by mouth daily at 6 PM. 90 tablet 3   bisacodyl (DULCOLAX) 5 MG EC tablet Take 5 mg by mouth daily as needed for moderate constipation. Patient states she takes 6 a day when she is constipated     clopidogrel (PLAVIX) 75 MG tablet Take 1 tablet (75 mg total) by mouth daily. 90 tablet 3   losartan (COZAAR) 50 MG tablet Take 1 tablet (50 mg total) by mouth daily. 90 tablet 3   NIFEdipine (PROCARDIA  XL/NIFEDICAL-XL) 90 MG 24 hr tablet Take 1 tablet (90 mg total) by mouth daily. 90 tablet 3   omeprazole (PRILOSEC) 20 MG capsule Take 1 capsule (20 mg total) by mouth daily. 90 capsule 3   [DISCONTINUED] citalopram (CELEXA) 10 MG tablet Take 1 tablet (10 mg total) by mouth daily. 90 tablet 3   [DISCONTINUED] fluticasone (FLONASE) 50 MCG/ACT nasal spray Place 2 sprays into both nostrils daily. (Patient taking differently: Place 2 sprays into both nostrils as needed for rhinitis. ) 16 g 6   No current facility-administered medications on file prior to visit.        ROS:  All others reviewed and negative.  Objective        PE:  BP 140/78 (BP Location: Left Arm, Patient Position: Sitting, Cuff Size: Normal)    Pulse 85    Temp 98.5 F (36.9 C)  (Oral)    Ht 5\' 2"  (1.575 m)    Wt 119 lb (54 kg)    SpO2 98%    BMI 21.77 kg/m                 Constitutional: Pt appears in NAD               HENT: Head: NCAT.                Right Ear: External ear normal.                 Left Ear: External ear normal.                Eyes: . Pupils are equal, round, and reactive to light. Conjunctivae and EOM are normal               Nose: without d/c or deformity               Neck: Neck supple. Gross normal ROM               Cardiovascular: Normal rate and regular rhythm.                 Pulmonary/Chest: Effort normal and breath sounds without rales or wheezing.                Abd:  Soft, NT, ND, + BS, no organomegaly               Neurological: Pt is alert. At baseline orientation, motor grossly intact               Skin: Skin is warm. No rashes, no other new lesions, LE edema - none               Psychiatric: Pt behavior is normal without agitation   Micro: none  Cardiac tracings I have personally interpreted today:  none  Pertinent Radiological findings (summarize): none   Lab Results  Component Value Date   WBC 10.1 01/19/2022   HGB 10.9 (L) 01/19/2022   HCT 34.5 (L) 01/19/2022   PLT 457.0 (H) 01/19/2022   GLUCOSE 85 01/19/2022   CHOL 185 01/19/2022   TRIG 87.0 01/19/2022   HDL 88.80 01/19/2022   LDLCALC 79 01/19/2022   ALT 16 01/19/2022   AST 22 01/19/2022   NA 139 01/19/2022   K 3.7 01/19/2022   CL 104 01/19/2022   CREATININE 1.36 (H) 01/19/2022   BUN 24 (H) 01/19/2022   CO2 24 01/19/2022   TSH 0.47 01/19/2022   INR 1.1 05/24/2019  HGBA1C 5.7 01/19/2022   Assessment/Plan:  Elaine Turner is a 76 y.o. Black or African American [2] female with  has a past medical history of AIN III (anal intraepithelial neoplasia III) (06/26/2015), Aortic atherosclerosis ([place]), Chronic diarrhea, COPD  GOLD 0 / active smoker  (10/24/2015), Diverticulitis, Diverticulosis, Esophageal stricture, Heart murmur, Hemorrhoids, Hyperglycemia  (05/01/2013), Hypertension, Ischemic optic neuropathy, left eye (05/25/2019), Pneumonia (05/01/2013), PVD (peripheral vascular disease) ([place]), and Urine incontinence.  B12 deficiency Lab Results  Component Value Date   VITAMINB12 172 (L) 01/19/2022   Low, to start oral replacement - b12 1000 mcg qd   CKD (chronic kidney disease) stage 3, GFR 30-59 ml/min (HCC) Lab Results  Component Value Date   CREATININE 1.36 (H) 01/19/2022   Stable overall, cont to avoid nephrotoxins   COPD  GOLD 0 / active smoker  Overall stable, cont inhaler prn, for cxr given wt loss, declines pulm f/u for now  Hyperglycemia Lab Results  Component Value Date   HGBA1C 5.7 01/19/2022   Stable, pt to continue current medical treatment  - diet   Hypertension BP Readings from Last 3 Encounters:  01/19/22 140/78  10/31/21 130/70  09/16/21 (!) 161/63   Stable, pt to continue medical treatment losartan, procardia   Tobacco dependence Pt counsled to quit smoking, pt not ready  Vitamin D deficiency Last vitamin D Lab Results  Component Value Date   VD25OH 20.02 (L) 01/19/2022   Low, to start oral replacement   Weight loss Etiology unclear, ? Geriatric decline or copd related?  Cont to monitor, for ensure bid  Followup: Return in about 3 months (around 04/19/2022).  Cathlean Cower, MD 01/25/2022 3:40 PM [facility]

## 2022-01-20 ENCOUNTER — Encounter: Payer: Self-pay | Admitting: Internal Medicine

## 2022-01-20 ENCOUNTER — Other Ambulatory Visit: Payer: Self-pay | Admitting: Internal Medicine

## 2022-01-20 LAB — HEMOGLOBIN A1C: Hgb A1c MFr Bld: 5.7 % (ref 4.6–6.5)

## 2022-01-20 MED ORDER — FUROSEMIDE 20 MG PO TABS: 20.0000 mg | ORAL_TABLET | Freq: Every day | ORAL | 11 refills | Status: DC

## 2022-01-25 ENCOUNTER — Encounter: Payer: Self-pay | Admitting: Internal Medicine

## 2022-01-25 NOTE — Assessment & Plan Note (Addendum)
Overall stable, cont inhaler prn, for cxr given wt loss, declines pulm f/u for now

## 2022-01-25 NOTE — Assessment & Plan Note (Signed)
BP Readings from Last 3 Encounters:  01/19/22 140/78  10/31/21 130/70  09/16/21 (!) 161/63   Stable, pt to continue medical treatment losartan, procardia

## 2022-01-25 NOTE — Assessment & Plan Note (Signed)
Lab Results  Component Value Date   CREATININE 1.36 (H) 01/19/2022   Stable overall, cont to avoid nephrotoxins

## 2022-01-25 NOTE — Assessment & Plan Note (Signed)
Last vitamin D Lab Results  Component Value Date   VD25OH 20.02 (L) 01/19/2022   Low, to start oral replacement

## 2022-01-25 NOTE — Assessment & Plan Note (Signed)
Pt counsled to quit smoking, pt not ready 

## 2022-01-25 NOTE — Assessment & Plan Note (Signed)
Lab Results  Component Value Date   VITAMINB12 172 (L) 01/19/2022   Low, to start oral replacement - b12 1000 mcg qd

## 2022-01-25 NOTE — Assessment & Plan Note (Signed)
Etiology unclear, ? Geriatric decline or copd related?  Cont to monitor, for ensure bid

## 2022-01-25 NOTE — Assessment & Plan Note (Signed)
Lab Results  Component Value Date   HGBA1C 5.7 01/19/2022   Stable, pt to continue current medical treatment  - diet  

## 2022-01-26 ENCOUNTER — Ambulatory Visit (INDEPENDENT_AMBULATORY_CARE_PROVIDER_SITE_OTHER): Payer: Medicare Other

## 2022-01-26 DIAGNOSIS — I639 Cerebral infarction, unspecified: Secondary | ICD-10-CM

## 2022-01-26 LAB — CUP PACEART REMOTE DEVICE CHECK
Date Time Interrogation Session: 20230129231246
Implantable Pulse Generator Implant Date: 20200528

## 2022-01-29 ENCOUNTER — Telehealth: Payer: Self-pay

## 2022-01-29 NOTE — Telephone Encounter (Signed)
Called patient to send in a manual transmission patients reader for her handheld monitor needs to charge, 3230 error code informed patient I would call her back this afternoon after the reader had time to charge.

## 2022-02-02 NOTE — Progress Notes (Signed)
Carelink Summary Report / Loop Recorder 

## 2022-02-02 NOTE — Telephone Encounter (Signed)
SPOKE WITH PATIENT>  attempted to assist with manual transmission.  Pt received error message 2258, indicating handheld battery needs replacing.  Offered to call tech support with patent  She requested number so she can call herself.

## 2022-02-06 ENCOUNTER — Ambulatory Visit (INDEPENDENT_AMBULATORY_CARE_PROVIDER_SITE_OTHER): Payer: Medicare Other | Admitting: Internal Medicine

## 2022-02-06 ENCOUNTER — Other Ambulatory Visit: Payer: Self-pay

## 2022-02-06 VITALS — BP 144/70 | HR 86 | Temp 98.7°F | Ht 62.0 in | Wt 117.5 lb

## 2022-02-06 DIAGNOSIS — N1832 Chronic kidney disease, stage 3b: Secondary | ICD-10-CM

## 2022-02-06 DIAGNOSIS — J449 Chronic obstructive pulmonary disease, unspecified: Secondary | ICD-10-CM | POA: Diagnosis not present

## 2022-02-06 DIAGNOSIS — J309 Allergic rhinitis, unspecified: Secondary | ICD-10-CM | POA: Diagnosis not present

## 2022-02-06 DIAGNOSIS — Z23 Encounter for immunization: Secondary | ICD-10-CM | POA: Diagnosis not present

## 2022-02-06 DIAGNOSIS — J811 Chronic pulmonary edema: Secondary | ICD-10-CM | POA: Diagnosis not present

## 2022-02-06 DIAGNOSIS — I10 Essential (primary) hypertension: Secondary | ICD-10-CM | POA: Diagnosis not present

## 2022-02-06 NOTE — Progress Notes (Addendum)
Patient ID: Elaine Turner, female   DOB: April 03, 1946, 76 y.o.   MRN: [id number]        Chief Complaint: follow up dyspnea       HPI:  Elaine Turner is a 76 y.o. female here to f/u recent dyspnea, cxr with early pulm edema, tx with lasix 20 qd and today much improved, Pt denies chest pain, increased sob or doe, wheezing, orthopnea, PND, increased LE swelling, palpitations, dizziness or syncope.   Pt denies polydipsia, polyuria, or new focal neuro s/s.   Pt denies fever, wt loss, night sweats, loss of appetite, or other constitutional symptoms .   Last echo may 2020 with normal EF and diastolic dysfxn.  Inhaler also helps at times.  Due for flu shot  No other new complaints.  BP ok at home.  Does have several wks ongoing nasal allergy symptoms with clearish congestion, itch and sneezing, without fever, pain, ST, cough, swelling or wheezing.       Wt Readings from Last 3 Encounters:  02/06/22 117 lb 8 oz (53.3 kg)  01/19/22 119 lb (54 kg)  10/31/21 114 lb (51.7 kg)   BP Readings from Last 3 Encounters:  02/06/22 (!) 144/70  01/19/22 140/78  10/31/21 130/70         Past Medical History:  Diagnosis Date   AIN III (anal intraepithelial neoplasia III) 06/26/2015   AIN II-III of internal hemorrhoids   Aortic atherosclerosis (HCC)    Chronic diarrhea    COPD  GOLD 0 / active smoker  10/24/2015   Diverticulitis    Diverticulosis    Esophageal stricture    Heart murmur    Hemorrhoids    Hyperglycemia 05/01/2013   Hypertension    Ischemic optic neuropathy, left eye 05/25/2019   Pneumonia 05/01/2013   PVD (peripheral vascular disease) ([place])    Urine incontinence    Past Surgical History:  Procedure Laterality Date   ABDOMINAL HYSTERECTOMY     CARPAL TUNNEL RELEASE Right 09/2019   HEMORRHOID SURGERY N/A 06/26/2015   Procedure: INTERNAL AND EXTERNAL HEMORRHOIDECTOMY;  Surgeon: Georganna Skeans, MD;  Location: [facility];  Service: General;  Laterality: N/A;   LOOP RECORDER  INSERTION N/A 05/25/2019   Procedure: LOOP RECORDER INSERTION;  Surgeon: Constance Haw, MD;  Location: [facility];  Service: Cardiovascular;  Laterality: N/A;    reports that she has been smoking cigarettes. She has a 62.00 pack-year smoking history. She has never used smokeless tobacco. She reports current alcohol use. She reports that she does not use drugs. family history includes Hypertension in her mother; Lung cancer in her father. Allergies  Allergen Reactions   Ace Inhibitors Cough   Levaquin [Levofloxacin] Other (See Comments)    GI upset   Current Outpatient Medications on File Prior to Visit  Medication Sig Dispense Refill   albuterol (VENTOLIN HFA) 108 (90 Base) MCG/ACT inhaler TAKE 2 PUFFS BY MOUTH EVERY 6 HOURS AS NEEDED FOR WHEEZE OR SHORTNESS OF BREATH 6.7 each 2   aspirin 81 MG EC tablet Take 1 tablet (81 mg total) by mouth daily. Swallow whole. 30 tablet 12   atorvastatin (LIPITOR) 40 MG tablet Take 1 tablet (40 mg total) by mouth daily at 6 PM. 90 tablet 3   bisacodyl (DULCOLAX) 5 MG EC tablet Take 5 mg by mouth daily as needed for moderate constipation. Patient states she takes 6 a day when she is constipated     clopidogrel (PLAVIX) 75 MG  tablet Take 1 tablet (75 mg total) by mouth daily. 90 tablet 3   furosemide (LASIX) 20 MG tablet Take 1 tablet (20 mg total) by mouth daily. 30 tablet 11   losartan (COZAAR) 50 MG tablet Take 1 tablet (50 mg total) by mouth daily. 90 tablet 3   meloxicam (MOBIC) 7.5 MG tablet Take 1 tablet (7.5 mg total) by mouth daily as needed for pain. 90 tablet 1   NIFEdipine (PROCARDIA XL/NIFEDICAL-XL) 90 MG 24 hr tablet Take 1 tablet (90 mg total) by mouth daily. 90 tablet 3   omeprazole (PRILOSEC) 20 MG capsule Take 1 capsule (20 mg total) by mouth daily. 90 capsule 3   tiZANidine (ZANAFLEX) 2 MG tablet Take 1 tablet (2 mg total) by mouth every 6 (six) hours as needed for muscle spasms. 30 tablet 2   topiramate (TOPAMAX) 25 MG tablet  Take 1 tablet (25 mg total) by mouth 2 (two) times daily. 180 tablet 1   traMADol (ULTRAM) 50 MG tablet Take 1 tablet (50 mg total) by mouth 3 (three) times daily as needed. 60 tablet 2   traZODone (DESYREL) 50 MG tablet Take 0.5-1 tablets (25-50 mg total) by mouth at bedtime as needed for sleep. 90 tablet 1   [DISCONTINUED] citalopram (CELEXA) 10 MG tablet Take 1 tablet (10 mg total) by mouth daily. 90 tablet 3   [DISCONTINUED] fluticasone (FLONASE) 50 MCG/ACT nasal spray Place 2 sprays into both nostrils daily. (Patient taking differently: Place 2 sprays into both nostrils as needed for rhinitis. ) 16 g 6   No current facility-administered medications on file prior to visit.        ROS:  All others reviewed and negative.  Objective        PE:  BP (!) 144/70    Pulse 86    Temp 98.7 F (37.1 C) (Oral)    Ht 5\' 2"  (1.575 m)    Wt 117 lb 8 oz (53.3 kg)    SpO2 96%    BMI 21.49 kg/m                 Constitutional: Pt appears in NAD               HENT: Head: NCAT.                Right Ear: External ear normal.                 Left Ear: External ear normal.                Eyes: . Pupils are equal, round, and reactive to light. Conjunctivae and EOM are normal               Nose: without d/c or deformity               Neck: Neck supple. Gross normal ROM               Cardiovascular: Normal rate and regular rhythm.                 Pulmonary/Chest: Effort normal and breath sounds without rales or wheezing.                Abd:  Soft, NT, ND, + BS, no organomegaly               Neurological: Pt is alert. At baseline orientation, motor grossly intact  Skin: Skin is warm. No rashes, no other new lesions, LE edema - none               Psychiatric: Pt behavior is normal without agitation   Micro: none  Cardiac tracings I have personally interpreted today:  none  Pertinent Radiological findings (summarize): none   Lab Results  Component Value Date   WBC 10.1 01/19/2022   HGB 10.9  (L) 01/19/2022   HCT 34.5 (L) 01/19/2022   PLT 457.0 (H) 01/19/2022   GLUCOSE 85 01/19/2022   CHOL 185 01/19/2022   TRIG 87.0 01/19/2022   HDL 88.80 01/19/2022   LDLCALC 79 01/19/2022   ALT 16 01/19/2022   AST 22 01/19/2022   NA 139 01/19/2022   K 3.7 01/19/2022   CL 104 01/19/2022   CREATININE 1.36 (H) 01/19/2022   BUN 24 (H) 01/19/2022   CO2 24 01/19/2022   TSH 0.47 01/19/2022   INR 1.1 05/24/2019   HGBA1C 5.7 01/19/2022   Assessment/Plan:  Elaine Turner is a 76 y.o. Black or African American [2] female with  has a past medical history of AIN III (anal intraepithelial neoplasia III) (06/26/2015), Aortic atherosclerosis ([place]), Chronic diarrhea, COPD  GOLD 0 / active smoker  (10/24/2015), Diverticulitis, Diverticulosis, Esophageal stricture, Heart murmur, Hemorrhoids, Hyperglycemia (05/01/2013), Hypertension, Ischemic optic neuropathy, left eye (05/25/2019), Pneumonia (05/01/2013), PVD (peripheral vascular disease) ([place]), and Urine incontinence.  CKD (chronic kidney disease) stage 3, GFR 30-59 ml/min (HCC) Lab Results  Component Value Date   CREATININE 1.36 (H) 01/19/2022   Stable overall, cont to avoid nephrotoxins   COPD  GOLD 0 / active smoker  [place] table , continue inhaler prn  Pulmonary edema Most likely due to HFpEF, symptomatically much imrpoved, declines repeat echo for now, cont lasix 20 qd, for BMP, f/u cardiology as planned  Hypertension BP Readings from Last 3 Encounters:  02/06/22 (!) 144/70  01/19/22 140/78  10/31/21 130/70   Mild uncontrolled now, pt to continue medical treatment losartan nifedipine as declines change today   Allergic rhinitis Mild to mod, for nasacort asd,  to f/u any worsening symptoms or concerns  Followup: Return in about 4 months (around 06/06/2022).  Cathlean Cower, MD 02/08/2022 6:59 AM [facility]

## 2022-02-06 NOTE — Patient Instructions (Addendum)
You had the flu shot today  Please continue all other medications as before, and refills have been done if requested.  Please have the pharmacy call with any other refills you may need.  Please continue your efforts at being more active, low cholesterol diet, and weight control.  Please keep your appointments with your specialists as you may have planned  Please make an Appointment to return in 4 months, or sooner if needed

## 2022-02-08 ENCOUNTER — Encounter: Payer: Self-pay | Admitting: Internal Medicine

## 2022-02-08 NOTE — Assessment & Plan Note (Signed)
Mild to mod, for nasacort asd,  to f/u any worsening symptoms or concerns 

## 2022-02-08 NOTE — Assessment & Plan Note (Signed)
Most likely due to HFpEF, symptomatically much imrpoved, declines repeat echo for now, cont lasix 20 qd, for BMP, f/u cardiology as planned

## 2022-02-08 NOTE — Assessment & Plan Note (Signed)
Overall stable, continue inhaler prn 

## 2022-02-08 NOTE — Assessment & Plan Note (Signed)
BP Readings from Last 3 Encounters:  02/06/22 (!) 144/70  01/19/22 140/78  10/31/21 130/70   Mild uncontrolled now, pt to continue medical treatment losartan nifedipine as declines change today

## 2022-02-08 NOTE — Assessment & Plan Note (Signed)
Lab Results  Component Value Date   CREATININE 1.36 (H) 01/19/2022   Stable overall, cont to avoid nephrotoxins

## 2022-02-11 ENCOUNTER — Ambulatory Visit: Payer: Medicare Other | Admitting: Nurse Practitioner

## 2022-03-02 ENCOUNTER — Ambulatory Visit (INDEPENDENT_AMBULATORY_CARE_PROVIDER_SITE_OTHER): Payer: Medicare Other

## 2022-03-02 DIAGNOSIS — I639 Cerebral infarction, unspecified: Secondary | ICD-10-CM

## 2022-03-02 LAB — CUP PACEART REMOTE DEVICE CHECK
Date Time Interrogation Session: 20230304000500
Implantable Pulse Generator Implant Date: 20200528

## 2022-03-03 ENCOUNTER — Other Ambulatory Visit: Payer: Self-pay

## 2022-03-03 ENCOUNTER — Ambulatory Visit (INDEPENDENT_AMBULATORY_CARE_PROVIDER_SITE_OTHER): Payer: Medicare Other | Admitting: Internal Medicine

## 2022-03-03 ENCOUNTER — Encounter: Payer: Self-pay | Admitting: Internal Medicine

## 2022-03-03 VITALS — BP 132/82 | HR 74 | Temp 99.0°F | Ht 62.0 in | Wt 119.0 lb

## 2022-03-03 DIAGNOSIS — E559 Vitamin D deficiency, unspecified: Secondary | ICD-10-CM

## 2022-03-03 DIAGNOSIS — E538 Deficiency of other specified B group vitamins: Secondary | ICD-10-CM

## 2022-03-03 DIAGNOSIS — R1032 Left lower quadrant pain: Secondary | ICD-10-CM

## 2022-03-03 NOTE — Patient Instructions (Addendum)
Please continue all other medications as before, and refills have been done if requested. ? ?Please have the pharmacy call with any other refills you may need. ? ?Please continue your efforts at being more active, low cholesterol diet, and weight control. ? ?You are otherwise up to date with prevention measures today. ? ?Please keep your appointments with your specialists as you may have planned ? ?You will be contacted regarding the referral for: Sports Medicine ? ?Please go to the XRAY Department in the first floor for the x-ray testing ? ?Please go to the LAB at the blood drawing area for the tests to be done ? ?You will be contacted by phone if any changes need to be made immediately.  Otherwise, you will receive a letter about your results with an explanation, but please check with MyChart first. ? ?Please remember to sign up for MyChart if you have not done so, as this will be important to you in the future with finding out test results, communicating by private email, and scheduling acute appointments online when needed. ? ?Please make an Appointment to return in 3 months, or sooner if needed ? ? ?

## 2022-03-03 NOTE — Progress Notes (Signed)
Patient ID: Elaine Turner, female   DOB: 20-Feb-1946, 76 y.o.   MRN: [id number] ? ? ? ?    Chief Complaint: follow up left groin pain worsening, lower abdomen fullness ? ?     HPI:  Elaine Turner is a 76 y.o. female here with c/o 2 wks worsening left groin pain , severe, intermittent, radiation to the back, and worse to walk, better to sit, but no giveaways, falls.  Does have a kind of swelling in the area and is convinced more and more since last visit she has some kind of vague fullness to the LLQ/pelvis with mild dscomfort as well.  Denies worsening reflux, abd pain, dysphagia, n/v, bowel change or blood.  Pt denies chest pain, increased sob or doe, wheezing, orthopnea, PND, increased LE swelling, palpitations, dizziness or syncope.   Pt denies polydipsia, polyuria, or new focal neuro s/s.  Pt denies fever, wt loss, night sweats, loss of appetite, or other constitutional symptoms   ?      ?Wt Readings from Last 3 Encounters:  ?03/03/22 119 lb (54 kg)  ?02/06/22 117 lb 8 oz (53.3 kg)  ?01/19/22 119 lb (54 kg)  ? ?BP Readings from Last 3 Encounters:  ?03/03/22 132/82  ?02/06/22 (!) 144/70  ?01/19/22 140/78  ? ?      ?Past Medical History:  ?Diagnosis Date  ? AIN III (anal intraepithelial neoplasia III) 06/26/2015  ? AIN II-III of internal hemorrhoids  ? Aortic atherosclerosis ([place])   ? Chronic diarrhea   ? COPD  GOLD 0 / active smoker  10/24/2015  ? Diverticulitis   ? Diverticulosis   ? Esophageal stricture   ? Heart murmur   ? Hemorrhoids   ? Hyperglycemia 05/01/2013  ? Hypertension   ? Ischemic optic neuropathy, left eye 05/25/2019  ? Pneumonia 05/01/2013  ? PVD (peripheral vascular disease) ([place])   ? Urine incontinence   ? ?Past Surgical History:  ?Procedure Laterality Date  ? ABDOMINAL HYSTERECTOMY    ? CARPAL TUNNEL RELEASE Right 09/2019  ? HEMORRHOID SURGERY N/A 06/26/2015  ? Procedure: INTERNAL AND EXTERNAL HEMORRHOIDECTOMY;  Surgeon: Georganna Skeans, MD;  Location: [facility];  Service: General;   Laterality: N/A;  ? LOOP RECORDER INSERTION N/A 05/25/2019  ? Procedure: LOOP RECORDER INSERTION;  Surgeon: Constance Haw, MD;  Location: [facility];  Service: Cardiovascular;  Laterality: N/A;  ? ? reports that she has been smoking cigarettes. She has a 62.00 pack-year smoking history. She has never used smokeless tobacco. She reports current alcohol use. She reports that she does not use drugs. ?family history includes Hypertension in her mother; Lung cancer in her father. ?Allergies  ?Allergen Reactions  ? Ace Inhibitors Cough  ? Levaquin [Levofloxacin] Other (See Comments)  ?  GI upset  ? ?Current Outpatient Medications on File Prior to Visit  ?Medication Sig Dispense Refill  ? albuterol (VENTOLIN HFA) 108 (90 Base) MCG/ACT inhaler TAKE 2 PUFFS BY MOUTH EVERY 6 HOURS AS NEEDED FOR WHEEZE OR SHORTNESS OF BREATH 6.7 each 2  ? aspirin 81 MG EC tablet Take 1 tablet (81 mg total) by mouth daily. Swallow whole. 30 tablet 12  ? atorvastatin (LIPITOR) 40 MG tablet Take 1 tablet (40 mg total) by mouth daily at 6 PM. 90 tablet 3  ? bisacodyl (DULCOLAX) 5 MG EC tablet Take 5 mg by mouth daily as needed for moderate constipation. Patient states she takes 6 a day when she is constipated    ?  clopidogrel (PLAVIX) 75 MG tablet Take 1 tablet (75 mg total) by mouth daily. 90 tablet 3  ? furosemide (LASIX) 20 MG tablet Take 1 tablet (20 mg total) by mouth daily. 30 tablet 11  ? losartan (COZAAR) 50 MG tablet Take 1 tablet (50 mg total) by mouth daily. 90 tablet 3  ? meloxicam (MOBIC) 7.5 MG tablet Take 1 tablet (7.5 mg total) by mouth daily as needed for pain. 90 tablet 1  ? NIFEdipine (PROCARDIA XL/NIFEDICAL-XL) 90 MG 24 hr tablet Take 1 tablet (90 mg total) by mouth daily. 90 tablet 3  ? omeprazole (PRILOSEC) 20 MG capsule Take 1 capsule (20 mg total) by mouth daily. 90 capsule 3  ? tiZANidine (ZANAFLEX) 2 MG tablet Take 1 tablet (2 mg total) by mouth every 6 (six) hours as needed for muscle spasms. 30 tablet 2  ?  topiramate (TOPAMAX) 25 MG tablet Take 1 tablet (25 mg total) by mouth 2 (two) times daily. 180 tablet 1  ? traMADol (ULTRAM) 50 MG tablet Take 1 tablet (50 mg total) by mouth 3 (three) times daily as needed. 60 tablet 2  ? traZODone (DESYREL) 50 MG tablet Take 0.5-1 tablets (25-50 mg total) by mouth at bedtime as needed for sleep. 90 tablet 1  ? [DISCONTINUED] citalopram (CELEXA) 10 MG tablet Take 1 tablet (10 mg total) by mouth daily. 90 tablet 3  ? [DISCONTINUED] fluticasone (FLONASE) 50 MCG/ACT nasal spray Place 2 sprays into both nostrils daily. (Patient taking differently: Place 2 sprays into both nostrils as needed for rhinitis. ) 16 g 6  ? ?No current facility-administered medications on file prior to visit.  ? ?     ROS:  All others reviewed and negative. ? ?Objective  ? ?     PE:  BP 132/82 (BP Location: Right Arm, Patient Position: Sitting, Cuff Size: Normal)   Pulse 74   Temp 99 ?F (37.2 ?C) (Oral)   Ht '5\' 2"'$  (1.575 m)   Wt 119 lb (54 kg)   SpO2 99%   BMI 21.77 kg/m?  ? ?              Constitutional: Pt appears in NAD ?              HENT: Head: NCAT.  ?              Right Ear: External ear normal.   ?              Left Ear: External ear normal.  ?              Eyes: . Pupils are equal, round, and reactive to light. Conjunctivae and EOM are normal ?              Nose: without d/c or deformity ?              Neck: Neck supple. Gross normal ROM ?              Cardiovascular: Normal rate and regular rhythm.   ?              Pulmonary/Chest: Effort normal and breath sounds without rales or wheezing.  ?              Abd:  Soft, NT, ND, + BS, no organomegaly ?              Left inguinal area without swelling, LA , mass or tenderness, but has pain on left  hip ROM ?              Neurological: Pt is alert. At baseline orientation, motor grossly intact ?              Skin: Skin is warm. No rashes, no other new lesions, LE edema - none ?              Psychiatric: Pt behavior is normal without agitation   ? ?Micro: none ? ?Cardiac tracings I have personally interpreted today:  none ? ?Pertinent Radiological findings (summarize): none  ? ?Lab Results  ?Component Value Date  ? WBC 10.1 01/19/2022  ? HGB 10.9 (L) 01/19/2022  ? HCT 34.5 (L) 01/19/2022  ? PLT 457.0 (H) 01/19/2022  ? GLUCOSE 85 01/19/2022  ? CHOL 185 01/19/2022  ? TRIG 87.0 01/19/2022  ? HDL 88.80 01/19/2022  ? [place] 79 01/19/2022  ? ALT 16 01/19/2022  ? AST 22 01/19/2022  ? NA 139 01/19/2022  ? K 3.7 01/19/2022  ? CL 104 01/19/2022  ? CREATININE 1.36 (H) 01/19/2022  ? BUN 24 (H) 01/19/2022  ? CO2 24 01/19/2022  ? TSH 0.47 01/19/2022  ? INR 1.1 05/24/2019  ? HGBA1C 5.7 01/19/2022  ? ?Assessment/Plan:  ?Elaine Turner is a 76 y.o. Black or African American [2] female with  has a past medical history of AIN III (anal intraepithelial neoplasia III) (06/26/2015), Aortic atherosclerosis ([place]), Chronic diarrhea, COPD  GOLD 0 / active smoker  (10/24/2015), Diverticulitis, Diverticulosis, Esophageal stricture, Heart murmur, Hemorrhoids, Hyperglycemia (05/01/2013), Hypertension, Ischemic optic neuropathy, left eye (05/25/2019), Pneumonia (05/01/2013), PVD (peripheral vascular disease) ([place]), and Urine incontinence. ? ?Vitamin D deficiency ?Last vitamin D ?Lab Results  ?Component Value Date  ? VD25OH 20.02 (L) 01/19/2022  ? ?Low, to start oral replacement ? ? ?B12 deficiency ?Lab Results  ?Component Value Date  ? VITAMINB12 172 (L) 01/19/2022  ?low, to start oral replacement - b12 1000 mcg qd ? ? ?Abdominal pain ?Exam benign but presentation unusual and gradually worsening > 6 wks - for CT abd/pelvis, and repeat lab today including cbc ? ?Left groin pain ?I suspect underlying left hip djd, for plain films and see ortho vs sport med ? ?Followup: Return in about 3 months (around 06/03/2022). ? ?Cathlean Cower, MD 03/08/2022 9:19 PM ?[facility] ?[facility] ?Internal Medicine ?

## 2022-03-06 NOTE — Progress Notes (Signed)
? ? ?  Subjective:   ? ?CC: L hip pain ? ?I, Wendy Poet, LAT, ATC, am serving as scribe for Dr. Lynne Leader. ? ?HPI: Pt is a 76 y/o female presenting c/o L hip pain ongoing for a few months.  She locates her pain to groin area of her L hip.  She has previously been diagnosed w/ advanced B facet arthropathy at L4-5 and spinal stenosis per MRI in 2022. ? ?Low back pain: yes ?Radiating pain: no ?Aggravating factors: walking; trunk flexion  ?Treatments tried: none ? ?Diagnostic testing: L-spine MRI- 09/05/21; L hip XR- 08/05/21 ? ?Pertinent review of Systems: No fevers or chills ? ?Relevant historical information: COPD.  Peripheral vascular disease.  Smoker.  History of a central retinal vein occlusion. ? ?Needle phobia ? ? ?Objective:   ? ?Vitals:  ? 03/09/22 1047  ?BP: (!) 152/88  ?Pulse: 84  ?SpO2: 98%  ? ?General: Well Developed, well nourished, and in no acute distress.  ? ?MSK: Left hip: Normal-appearing ?Pain with internal rotation and flexion. ?Mild antalgic gait. ? ?Lab and Radiology Results ? ?X-ray left hip obtained today personally independently interpreted. ?Mild to moderate DJD left hip compared to right. ?No acute fractures are visible. ? ? ?Impression and Recommendations:   ? ?Assessment and Plan: ?76 y.o. female with left hip pain thought to be due to DJD.  Next step should be diagnostic and potentially therapeutic intra-articular injection left hip.  She is not excited about the idea of a hip injection and has some needle phobia.  We discussed her options and would recommend an injection with some antianxiety medication.  Prescribed lorazepam and recommend that she return with a driver in the near future.. ? ?PDMP not reviewed this encounter. ?Orders Placed This Encounter  ?Procedures  ? DG HIP UNILAT W OR W/O PELVIS 2-3 VIEWS LEFT  ?  Standing Status:   Future  ?  Number of Occurrences:   1  ?  Standing Expiration Date:   04/09/2022  ?  Order Specific Question:   Reason for Exam (SYMPTOM  OR DIAGNOSIS  REQUIRED)  ?  Answer:   left hip pain  ?  Order Specific Question:   Preferred imaging location?  ?  Answer:   [facility]  ? ?Meds ordered this encounter  ?Medications  ? LORazepam (ATIVAN) 0.5 MG tablet  ?  Sig: 1-2 tabs 30 - 60 min prior to MRI. Do not drive with this medicine.  ?  Dispense:  4 tablet  ?  Refill:  0  ? ? ?Discussed warning signs or symptoms. Please see discharge instructions. Patient expresses understanding. ? ? ?The above documentation has been reviewed and is accurate and complete Lynne Leader, M.D. ? ?

## 2022-03-08 ENCOUNTER — Encounter: Payer: Self-pay | Admitting: Internal Medicine

## 2022-03-08 NOTE — Assessment & Plan Note (Signed)
Lab Results  ?Component Value Date  ? VITAMINB12 172 (L) 01/19/2022  ?low, to start oral replacement - b12 1000 mcg qd ? ?

## 2022-03-08 NOTE — Assessment & Plan Note (Signed)
Last vitamin D ?Lab Results  ?Component Value Date  ? VD25OH 20.02 (L) 01/19/2022  ? ?Low, to start oral replacement ? ?

## 2022-03-08 NOTE — Assessment & Plan Note (Signed)
I suspect underlying left hip djd, for plain films and see ortho vs sport med ?

## 2022-03-08 NOTE — Assessment & Plan Note (Addendum)
Exam benign but presentation unusual and gradually worsening > 6 wks - for CT abd/pelvis, and repeat lab today including cbc ?

## 2022-03-09 ENCOUNTER — Other Ambulatory Visit: Payer: Self-pay

## 2022-03-09 ENCOUNTER — Other Ambulatory Visit (INDEPENDENT_AMBULATORY_CARE_PROVIDER_SITE_OTHER): Payer: Medicare Other

## 2022-03-09 ENCOUNTER — Encounter: Payer: Self-pay | Admitting: Family Medicine

## 2022-03-09 ENCOUNTER — Ambulatory Visit (INDEPENDENT_AMBULATORY_CARE_PROVIDER_SITE_OTHER): Payer: Medicare Other

## 2022-03-09 ENCOUNTER — Encounter: Payer: Self-pay | Admitting: Internal Medicine

## 2022-03-09 ENCOUNTER — Ambulatory Visit (INDEPENDENT_AMBULATORY_CARE_PROVIDER_SITE_OTHER): Payer: Medicare Other | Admitting: Family Medicine

## 2022-03-09 VITALS — BP 152/88 | HR 84 | Ht 62.0 in | Wt 116.0 lb

## 2022-03-09 DIAGNOSIS — M1612 Unilateral primary osteoarthritis, left hip: Secondary | ICD-10-CM | POA: Diagnosis not present

## 2022-03-09 DIAGNOSIS — M25552 Pain in left hip: Secondary | ICD-10-CM | POA: Diagnosis not present

## 2022-03-09 DIAGNOSIS — R1032 Left lower quadrant pain: Secondary | ICD-10-CM | POA: Diagnosis not present

## 2022-03-09 LAB — BASIC METABOLIC PANEL
BUN: 27 mg/dL — ABNORMAL HIGH (ref 6–23)
CO2: 25 mEq/L (ref 19–32)
Calcium: 9.3 mg/dL (ref 8.4–10.5)
Chloride: 103 mEq/L (ref 96–112)
Creatinine, Ser: 1.66 mg/dL — ABNORMAL HIGH (ref 0.40–1.20)
GFR: 29.46 mL/min — ABNORMAL LOW (ref >60.00)
Glucose, Bld: 100 mg/dL — ABNORMAL HIGH (ref 70–99)
Potassium: 4.3 mEq/L (ref 3.5–5.1)
Sodium: 138 mEq/L (ref 135–145)

## 2022-03-09 LAB — CBC WITH DIFFERENTIAL/PLATELET
Basophils Absolute: 0.1 10*3/uL (ref 0.0–0.1)
Basophils Relative: 1 % (ref 0.0–3.0)
Eosinophils Absolute: 0.2 10*3/uL (ref 0.0–0.7)
Eosinophils Relative: 1.8 % (ref 0.0–5.0)
HCT: 31.6 % — ABNORMAL LOW (ref 36.0–46.0)
Hemoglobin: 9.9 g/dL — ABNORMAL LOW (ref 12.0–15.0)
Lymphocytes Relative: 14 % (ref 12.0–46.0)
Lymphs Abs: 1.7 10*3/uL (ref 0.7–4.0)
MCHC: 31.5 g/dL (ref 30.0–36.0)
MCV: 73 fl — ABNORMAL LOW (ref 78.0–100.0)
Monocytes Absolute: 0.7 10*3/uL (ref 0.1–1.0)
Monocytes Relative: 5.8 % (ref 3.0–12.0)
Neutro Abs: 9.4 10*3/uL — ABNORMAL HIGH (ref 1.4–7.7)
Neutrophils Relative %: 77.4 % — ABNORMAL HIGH (ref 43.0–77.0)
Platelets: 410 10*3/uL — ABNORMAL HIGH (ref 150.0–400.0)
RBC: 4.32 Mil/uL (ref 3.87–5.11)
RDW: 17 % — ABNORMAL HIGH (ref 11.5–15.5)
WBC: 12.1 10*3/uL — ABNORMAL HIGH (ref 4.0–10.5)

## 2022-03-09 LAB — HEPATIC FUNCTION PANEL
ALT: 13 U/L (ref 0–35)
AST: 17 U/L (ref 0–37)
Albumin: 4.3 g/dL (ref 3.5–5.2)
Alkaline Phosphatase: 66 U/L (ref 39–117)
Bilirubin, Direct: 0.1 mg/dL (ref 0.0–0.3)
Total Bilirubin: 0.4 mg/dL (ref 0.2–1.2)
Total Protein: 6.9 g/dL (ref 6.0–8.3)

## 2022-03-09 LAB — URINALYSIS, ROUTINE W REFLEX MICROSCOPIC
Bilirubin Urine: NEGATIVE
Hgb urine dipstick: NEGATIVE
Ketones, ur: NEGATIVE
Leukocytes,Ua: NEGATIVE
Nitrite: NEGATIVE
RBC / HPF: NONE SEEN (ref 0–?)
Specific Gravity, Urine: 1.02 (ref 1.000–1.030)
Total Protein, Urine: 100 — AB
Urine Glucose: NEGATIVE
Urobilinogen, UA: 0.2 (ref 0.0–1.0)
pH: 6 (ref 5.0–8.0)

## 2022-03-09 LAB — LIPASE: Lipase: 43 U/L (ref 11.0–59.0)

## 2022-03-09 IMAGING — DX DG HIP (WITH OR WITHOUT PELVIS) 2-3V*L*
3 series · 3 of 3 positions shown · non-contrast
Comparison: [DATE]

CLINICAL DATA: Left hip pain for several months.

EXAM:
DG HIP (WITH OR WITHOUT PELVIS) 2-3V LEFT

[pelvis ap]
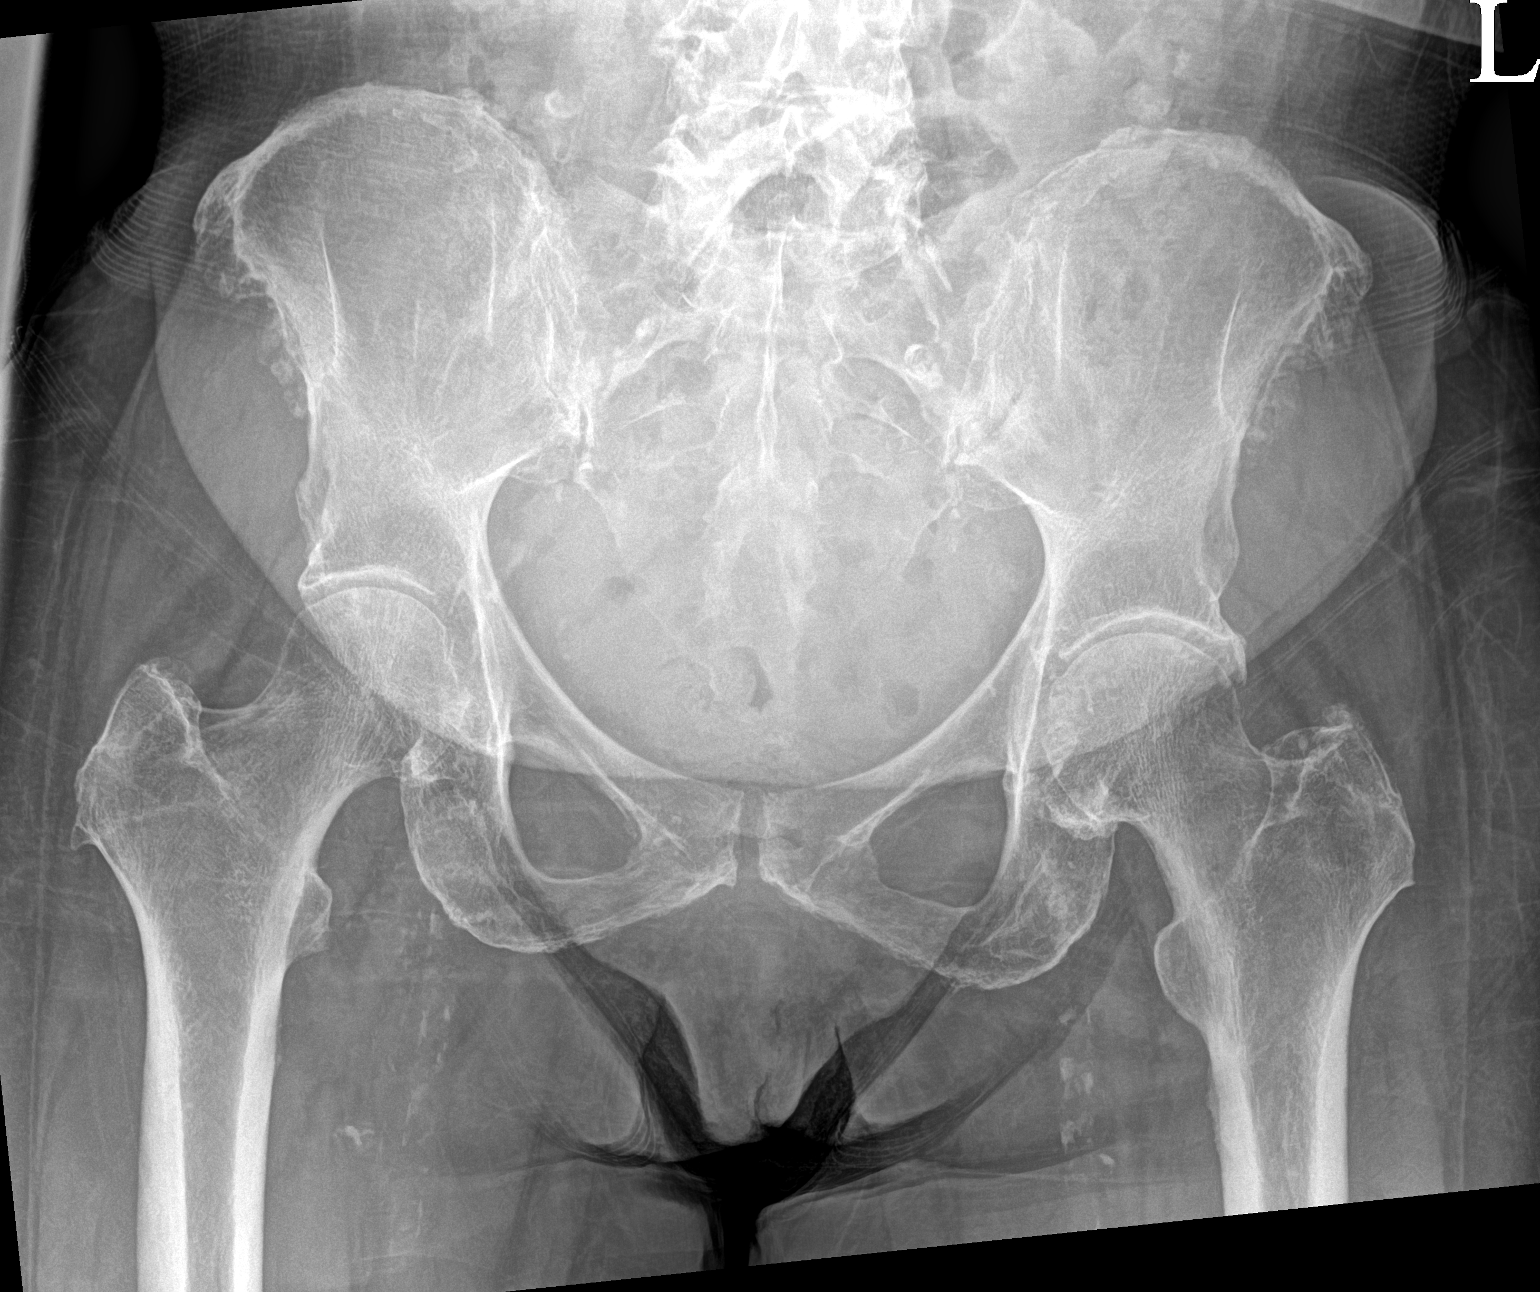

[hip ap]
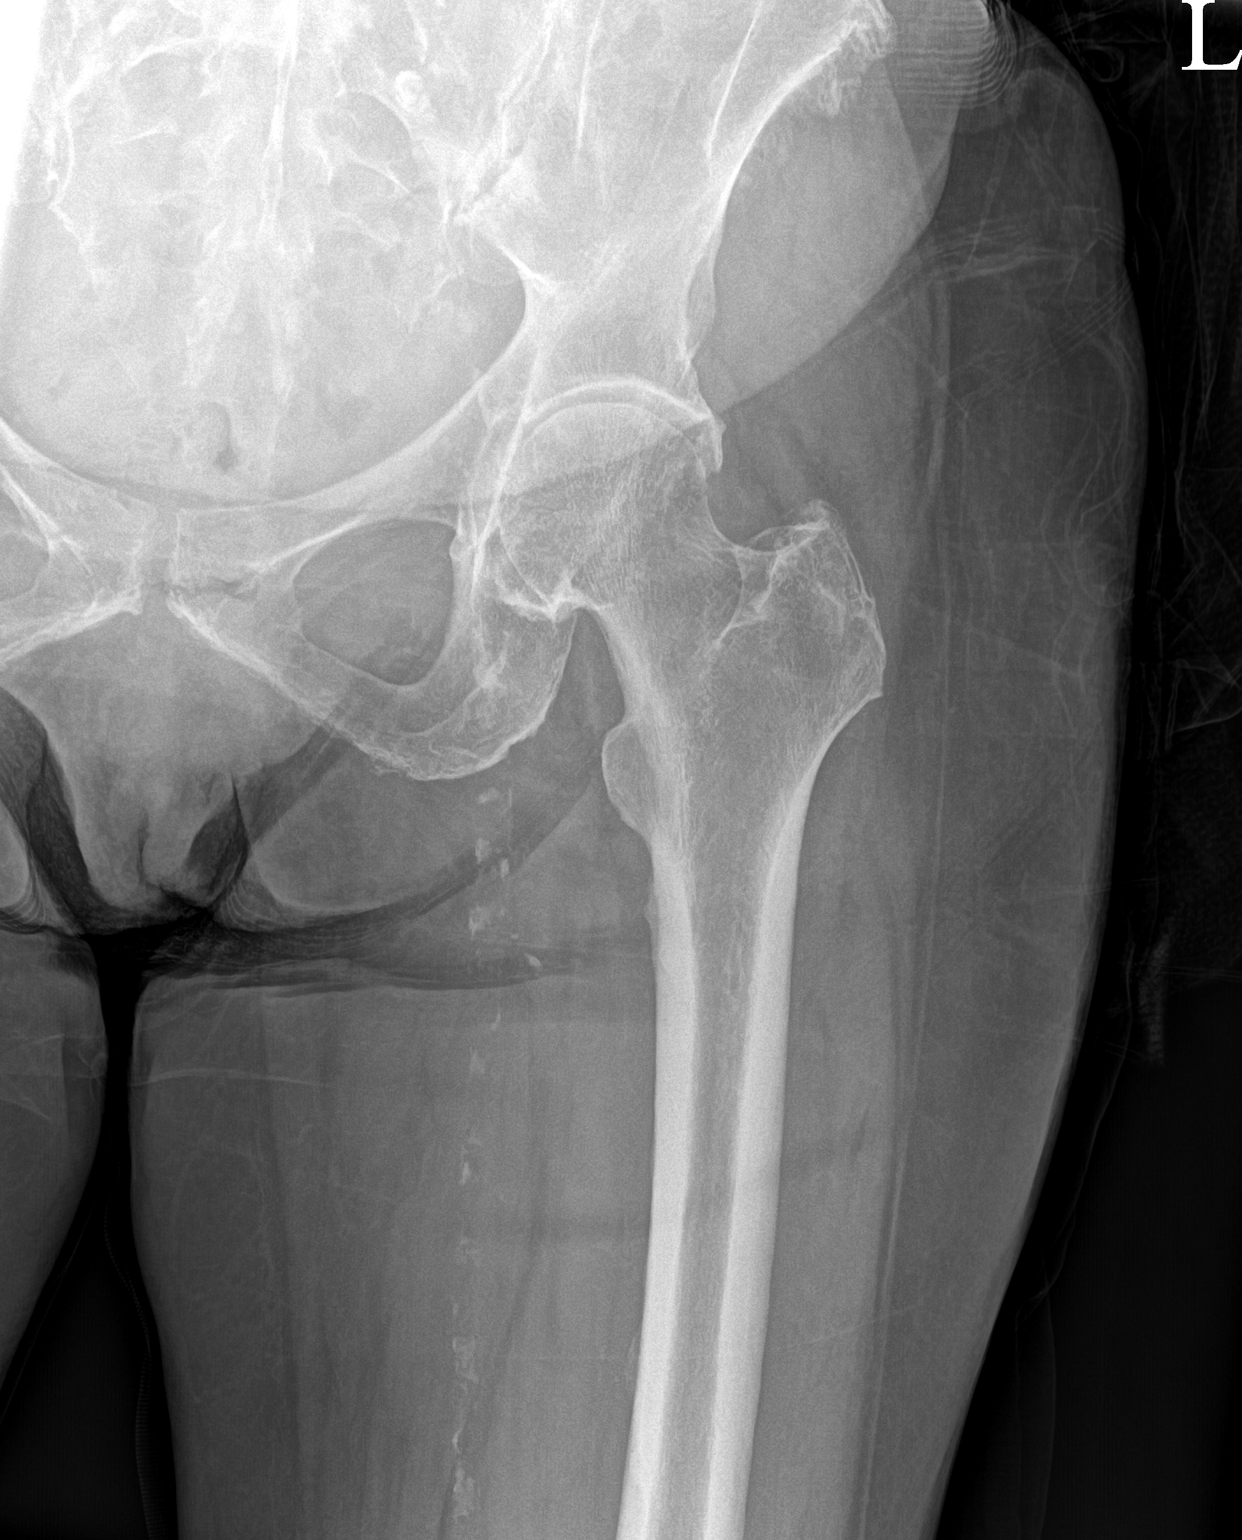

[hip frog leg]
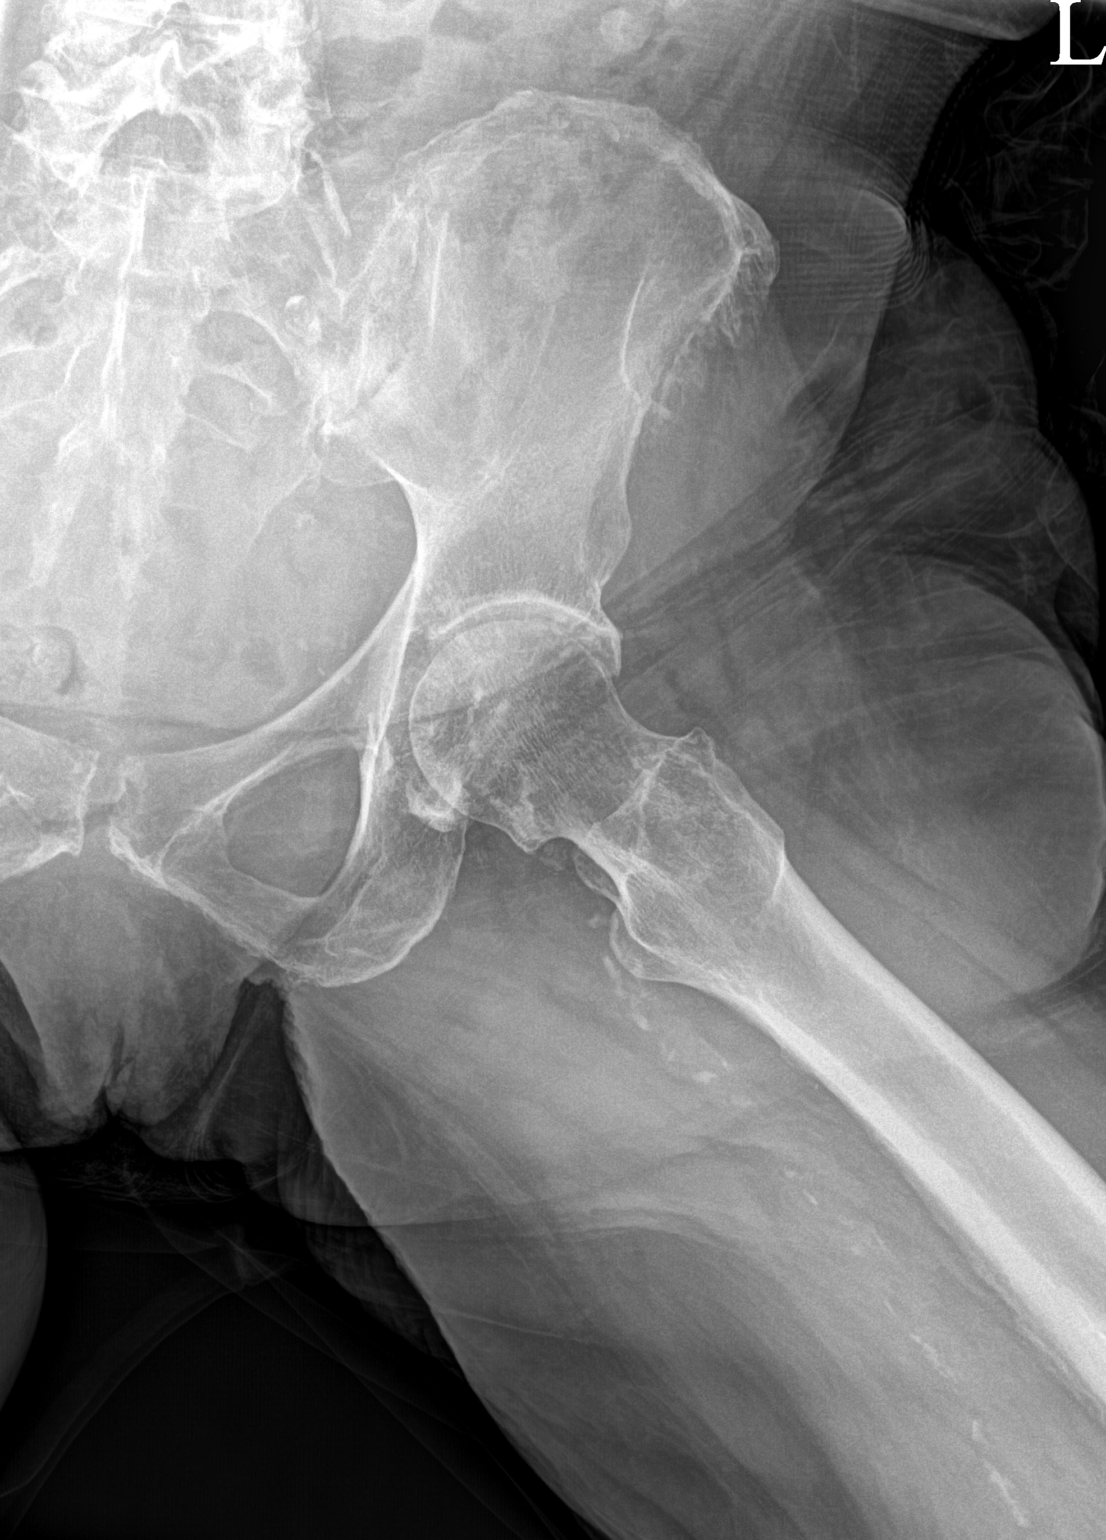

[3 of 3 positions shown; findings below may reference images not displayed]

FINDINGS: Moderate bilateral inferior sacroiliac joint space narrowing and
subchondral sclerosis degenerative change.

Moderate superomedial left and mild superomedial right
femoroacetabular joint space narrowing. Moderate left femoral
head-neck junction degenerative osteophytosis.

Moderate superolateral left acetabular degenerative osteophytosis.
No acute fracture or dislocation.
IMPRESSION: :
IMPRESSION: 1. Moderate left and mild right femoroacetabular osteoarthritis.
2. Moderate bilateral sacroiliac joint osteoarthritis.

## 2022-03-09 MED ORDER — LORAZEPAM 0.5 MG PO TABS: ORAL_TABLET | ORAL | 0 refills | Status: DC

## 2022-03-09 NOTE — Patient Instructions (Addendum)
Thank you for coming in today.  ? ?Schedule in the near future for an injection. Take the Ativan about 1 hour prior to the visit. You will need someone to drive you. ?

## 2022-03-10 NOTE — Progress Notes (Signed)
Carelink Summary Report / Loop Recorder 

## 2022-03-10 NOTE — Progress Notes (Signed)
Left hip x-ray shows medium arthritis

## 2022-03-25 ENCOUNTER — Telehealth: Payer: Self-pay | Admitting: Internal Medicine
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
## 2022-03-29 NOTE — Telephone Encounter (Signed)
No, I dont think we were aware of any aggressive behavior in the past.  Please consider taking to ED for any persistent or worsening.  ?

## 2022-03-30 NOTE — Telephone Encounter (Signed)
Patient's daughter notified.

## 2022-04-02 LAB — CUP PACEART REMOTE DEVICE CHECK
Date Time Interrogation Session: 20230406002000
Implantable Pulse Generator Implant Date: 20200528

## 2022-04-06 ENCOUNTER — Ambulatory Visit (INDEPENDENT_AMBULATORY_CARE_PROVIDER_SITE_OTHER): Payer: Medicare Other

## 2022-04-06 DIAGNOSIS — I639 Cerebral infarction, unspecified: Secondary | ICD-10-CM

## 2022-04-21 NOTE — Progress Notes (Signed)
Carelink Summary Report / Loop Recorder 

## 2022-04-30 NOTE — Progress Notes (Deleted)
   I, Wendy Poet, LAT, ATC, am serving as scribe for Dr. Lynne Leader.  Elaine Turner is a 76 y.o. female who presents to [place] at [facility] today for f/u of chronic L hip pain  thought to be due to DJD.  She was last seen by Dr. Georgina Snell on 03/09/22 and, due to being needle phobic, was prescribed Ativan to take prior to f/u visit for an injection.  Today, pt reports   Diagnostic testing: L hip XR- 03/09/22, 08/05/21; L-spine MRI- 09/05/21   Pertinent review of systems: ***  Relevant historical information: ***   Exam:  There were no vitals taken for this visit. General: Well Developed, well nourished, and in no acute distress.   MSK: ***    Lab and Radiology Results No results found for this or any previous visit (from the past 72 hour(s)). No results found.     Assessment and Plan: 76 y.o. female with ***   PDMP not reviewed this encounter. No orders of the defined types were placed in this encounter.  No orders of the defined types were placed in this encounter.    Discussed warning signs or symptoms. Please see discharge instructions. Patient expresses understanding.   ***

## 2022-05-04 ENCOUNTER — Ambulatory Visit: Payer: Medicare Other | Admitting: Family Medicine

## 2022-05-08 LAB — CUP PACEART REMOTE DEVICE CHECK
Date Time Interrogation Session: 20230510231728
Implantable Pulse Generator Implant Date: 20200528

## 2022-05-11 ENCOUNTER — Ambulatory Visit (INDEPENDENT_AMBULATORY_CARE_PROVIDER_SITE_OTHER): Payer: Medicare Other

## 2022-05-11 DIAGNOSIS — I639 Cerebral infarction, unspecified: Secondary | ICD-10-CM | POA: Diagnosis not present
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
## 2022-05-20 ENCOUNTER — Ambulatory Visit: Payer: Self-pay

## 2022-05-20 ENCOUNTER — Ambulatory Visit: Payer: Medicare Other | Admitting: Family Medicine

## 2022-05-20 VITALS — BP 140/86 | HR 69 | Ht 62.0 in | Wt 117.4 lb

## 2022-05-20 DIAGNOSIS — M25552 Pain in left hip: Secondary | ICD-10-CM | POA: Diagnosis not present

## 2022-05-20 DIAGNOSIS — M1612 Unilateral primary osteoarthritis, left hip: Secondary | ICD-10-CM | POA: Diagnosis not present

## 2022-05-20 NOTE — Patient Instructions (Addendum)
Thank you for coming in today.   You received a steroid injection in your left hip today. Seek immediate medical attention if the joint becomes red, extremely painful, or is oozing fluid.   Check back as needed

## 2022-05-28 NOTE — Progress Notes (Signed)
Carelink Summary Report / Loop Recorder 

## 2022-06-02 ENCOUNTER — Ambulatory Visit: Payer: Medicare Other | Admitting: Family Medicine

## 2022-06-02 NOTE — Progress Notes (Deleted)
   I, Wendy Poet, LAT, ATC, am serving as scribe for Dr. Lynne Leader.  Elaine Turner is a 76 y.o. female who presents to [place] at [facility] today for /u of L hip/groin pain due to hip OA/DJD.  In addition, pt has advanced B facet arthropathy at L4-5 and spinal stenosis per MRI in 2022.  She was last seen by Dr. Georgina Snell on 05/20/22 and had a L hip joint steroid injection.  Today, pt reports   Diagnostic testing: L hip XR- 03/09/22; L-spine MRI- 09/05/21; L hip XR- 08/05/21  Pertinent review of systems: ***  Relevant historical information: ***   Exam:  There were no vitals taken for this visit. General: Well Developed, well nourished, and in no acute distress.   MSK: ***    Lab and Radiology Results No results found for this or any previous visit (from the past 72 hour(s)). No results found.     Assessment and Plan: 76 y.o. female with ***   PDMP not reviewed this encounter. No orders of the defined types were placed in this encounter.  No orders of the defined types were placed in this encounter.    Discussed warning signs or symptoms. Please see discharge instructions. Patient expresses understanding.   ***

## 2022-06-05 ENCOUNTER — Ambulatory Visit: Payer: Medicare Other | Admitting: Internal Medicine

## 2022-06-09 NOTE — Progress Notes (Unsigned)
I, Wendy Poet, LAT, ATC, am serving as scribe for Dr. Lynne Leader.  Elaine Turner is a 76 y.o. female who presents to [place] at [facility] today for f/u of L hip/groin pain.  Pt has advanced B facet arthropathy at L4-5 and spinal stenosis per MRI in 2022.  She was last seen by Dr. Georgina Snell on 05/20/22 and had a L hip femoral acetabular joint steroid injection.  Today, pt reports pain has moved to across her low back, starting about 2 weeks ago. Pt notes continued "soreness" in her L groin.   Diagnostic testing: L hip XR- 03/09/22; L-spine MRI- 09/05/21; L hip XR- 08/05/21  Pertinent review of systems: No fevers or chills  Relevant historical information: Peripheral vascular disease.  COPD.   Exam:  BP (!) 172/78   Pulse 83   Ht '5\' 2"'$  (1.575 m)   Wt 115 lb 6.4 oz (52.3 kg)   SpO2 96%   BMI 21.11 kg/m  General: Well Developed, well nourished, and in no acute distress.   MSK: Left hip: Nontender. Decreased range of motion pain with flexion and internal rotation.  L-spine: Nontender midline. Decreased lumbar motion. Lower extremity strength is intact.    Lab and Radiology Results  EXAM: DG HIP (WITH OR WITHOUT PELVIS) 2-3V LEFT   COMPARISON:  08/05/2021   FINDINGS: Moderate bilateral inferior sacroiliac joint space narrowing and subchondral sclerosis degenerative change.   Moderate superomedial left and mild superomedial right femoroacetabular joint space narrowing. Moderate left femoral head-neck junction degenerative osteophytosis.   Moderate superolateral left acetabular degenerative osteophytosis. No acute fracture or dislocation.   IMPRESSION:: IMPRESSION: 1. Moderate left and mild right femoroacetabular osteoarthritis. 2. Moderate bilateral sacroiliac joint osteoarthritis.     Electronically Signed   By: Yvonne Kendall M.D.   On: 03/09/2022 16:33   I, Lynne Leader, personally (independently) visualized and performed the interpretation of  the images attached in this note.    Assessment and Plan: 76 y.o. female with left anterior hip pain thought to be due to DJD.  She has moderate hip arthritis seen on x-ray from March this year.  She had good but very temporary relief of her anterior hip pain with intra-articular injection May 24.  At this point I am not optimistic that conservative management strategy is going to help all that much.  I think it is worth her time to have a discussion with orthopedic surgery about total hip replacement options.  Plan to refer to Ortho surgery to discuss hip replacement timing recovery time etc.  She also has a 1 week history of acute low back pain that is improving.  Pain thought to be due to myofascial spasm and dysfunction.  Plan for heating pad and tizanidine.  Recommend normal activity level.  If not improving she will let me know and I will refer to physical therapy.   PDMP not reviewed this encounter. Orders Placed This Encounter  Procedures   Ambulatory referral to Orthopedic Surgery    Referral Priority:   Routine    Referral Type:   Surgical    Referral Reason:   Specialty Services Required    Requested Specialty:   Orthopedic Surgery    Number of Visits Requested:   1   Meds ordered this encounter  Medications   tiZANidine (ZANAFLEX) 2 MG tablet    Sig: Take 1 tablet (2 mg total) by mouth every 6 (six) hours as needed for muscle spasms.    Dispense:  30 tablet    Refill:  2     Discussed warning signs or symptoms. Please see discharge instructions. Patient expresses understanding.   The above documentation has been reviewed and is accurate and complete Lynne Leader, M.D.

## 2022-06-10 ENCOUNTER — Ambulatory Visit (INDEPENDENT_AMBULATORY_CARE_PROVIDER_SITE_OTHER): Payer: Medicare Other | Admitting: Family Medicine

## 2022-06-10 ENCOUNTER — Ambulatory Visit: Payer: Medicare Other | Admitting: Internal Medicine

## 2022-06-10 VITALS — BP 172/78 | HR 83 | Ht 62.0 in | Wt 115.4 lb

## 2022-06-10 DIAGNOSIS — M1612 Unilateral primary osteoarthritis, left hip: Secondary | ICD-10-CM | POA: Diagnosis not present

## 2022-06-10 DIAGNOSIS — M25552 Pain in left hip: Secondary | ICD-10-CM | POA: Diagnosis not present

## 2022-06-10 DIAGNOSIS — S39012A Strain of muscle, fascia and tendon of lower back, initial encounter: Secondary | ICD-10-CM | POA: Diagnosis not present

## 2022-06-10 MED ORDER — TIZANIDINE HCL 2 MG PO TABS
2.0000 mg | ORAL_TABLET | Freq: Four times a day (QID) | ORAL | 2 refills | Status: AC | PRN
Start: 2022-06-10 — End: ?

## 2022-06-10 NOTE — Patient Instructions (Addendum)
Thank you for coming in today.   Take tizanidine as needed for muscle spasm. It will make you sleepy.   Use a heating pad for muscle spasm.   Please call [facility] to discuss hip replacement.   Recheck as needed.

## 2022-06-15 ENCOUNTER — Ambulatory Visit (INDEPENDENT_AMBULATORY_CARE_PROVIDER_SITE_OTHER): Payer: Medicare Other

## 2022-06-15 DIAGNOSIS — I639 Cerebral infarction, unspecified: Secondary | ICD-10-CM | POA: Diagnosis not present

## 2022-06-15 LAB — CUP PACEART REMOTE DEVICE CHECK
Date Time Interrogation Session: 20230612231933
Implantable Pulse Generator Implant Date: 20200528

## 2022-06-24 ENCOUNTER — Ambulatory Visit: Payer: Medicare Other | Admitting: Orthopaedic Surgery

## 2022-06-24 DIAGNOSIS — F17200 Nicotine dependence, unspecified, uncomplicated: Secondary | ICD-10-CM | POA: Diagnosis not present

## 2022-06-24 DIAGNOSIS — M545 Low back pain, unspecified: Secondary | ICD-10-CM | POA: Diagnosis not present

## 2022-06-24 DIAGNOSIS — M5136 Other intervertebral disc degeneration, lumbar region: Secondary | ICD-10-CM | POA: Diagnosis not present

## 2022-07-02 NOTE — Progress Notes (Signed)
Carelink Summary Report / Loop Recorder 

## 2022-07-15 ENCOUNTER — Ambulatory Visit: Payer: Medicare Other | Admitting: Internal Medicine

## 2022-07-15 ENCOUNTER — Encounter: Payer: Self-pay | Admitting: Internal Medicine

## 2022-07-15 VITALS — BP 128/66 | HR 73 | Ht 62.0 in | Wt 114.0 lb

## 2022-07-15 DIAGNOSIS — K2100 Gastro-esophageal reflux disease with esophagitis, without bleeding: Secondary | ICD-10-CM

## 2022-07-15 DIAGNOSIS — G8929 Other chronic pain: Secondary | ICD-10-CM

## 2022-07-15 DIAGNOSIS — K5900 Constipation, unspecified: Secondary | ICD-10-CM

## 2022-07-15 DIAGNOSIS — R1032 Left lower quadrant pain: Secondary | ICD-10-CM

## 2022-07-15 NOTE — Patient Instructions (Signed)
If you are age 78 or older, your body mass index should be between 23-30. Your Body mass index is 20.85 kg/m. If this is out of the aforementioned range listed, please consider follow up with your Primary Care Provider.  If you are age 2 or younger, your body mass index should be between 19-25. Your Body mass index is 20.85 kg/m. If this is out of the aformentioned range listed, please consider follow up with your Primary Care Provider.   ________________________________________________________  The [facility] providers would like to encourage you to use [facility] to communicate with providers for non-urgent requests or questions.  Due to long hold times on the telephone, sending your provider a message by [facility] may be a faster and more efficient way to get a response.  Please allow 48 business hours for a response.  Please remember that this is for non-urgent requests.  _______________________________________________________  Take 2 capfuls of Miralax daily in 16 ounces of water - increase if needed

## 2022-07-15 NOTE — Progress Notes (Signed)
HISTORY OF PRESENT ILLNESS:  Elaine Turner is a 76 y.o. female with past medical history as listed below.  She presents today with a chief complaint of constipation.  She tells me that she moves her bowels no more than once per week.  She tells me that she is taken various agents with variable results.  It is difficult for her to be specific regarding what agents she used it for a period of time.  No bleeding.  She continues with chronic mild left lower quadrant pain previous.  Patient did undergo complete colonoscopy and upper endoscopy November 2021 to evaluate microcytic anemia and abdominal pain.  Upper endoscopy revealed mild esophagitis and a small hernia.  Anoscopy revealed pandiverticulosis and internal hemorrhoids.  No other abnormalities.  Blood work from March 2023 showed normal liver tests.  Iron studies from January 2023 were normal.  She does have chronic anemia which has been stable.  CT scan November 2021 revealed diverticulosis.  No other significant abnormalities.  She was last seen in this office November 2022.  See that dictation.  Her weight is unchanged from that visit.  REVIEW OF SYSTEMS:  All non-GI ROS negative unless otherwise stated in the HPI except for arthritis, back pain, urinary leakage  Past Medical History:  Diagnosis Date   AIN III (anal intraepithelial neoplasia III) 06/26/2015   AIN II-III of internal hemorrhoids   Aortic atherosclerosis (HCC)    Chronic diarrhea    COPD  GOLD 0 / active smoker  10/24/2015   Diverticulitis    Diverticulosis    Esophageal stricture    Heart murmur    Hemorrhoids    Hyperglycemia 05/01/2013   Hypertension    Ischemic optic neuropathy, left eye 05/25/2019   Pneumonia 05/01/2013   PVD (peripheral vascular disease) ([place])    Urine incontinence     Past Surgical History:  Procedure Laterality Date   ABDOMINAL HYSTERECTOMY     CARPAL TUNNEL RELEASE Right 09/2019   HEMORRHOID SURGERY N/A 06/26/2015   Procedure: INTERNAL AND  EXTERNAL HEMORRHOIDECTOMY;  Surgeon: Georganna Skeans, MD;  Location: [facility];  Service: General;  Laterality: N/A;   LOOP RECORDER INSERTION N/A 05/25/2019   Procedure: LOOP RECORDER INSERTION;  Surgeon: Constance Haw, MD;  Location: [facility];  Service: Cardiovascular;  Laterality: N/A;    Social History Tira  reports that she has been smoking cigarettes. She has a 62.00 pack-year smoking history. She has never used smokeless tobacco. She reports current alcohol use. She reports that she does not use drugs.  family history includes Hypertension in her mother; Lung cancer in her father.  Allergies  Allergen Reactions   Ace Inhibitors Cough   Levaquin [Levofloxacin] Other (See Comments)    GI upset       PHYSICAL EXAMINATION: Vital signs: BP 128/66   Pulse 73   Ht '5\' 2"'$  (1.575 m)   Wt 114 lb (51.7 kg)   SpO2 98%   BMI 20.85 kg/m   Constitutional: generally well-appearing, no acute distress Psychiatric: alert and oriented x3, cooperative Eyes: extraocular movements intact, anicteric, conjunctiva pink Mouth: oral pharynx moist, no lesions Neck: supple no lymphadenopathy Cardiovascular: heart regular rate and rhythm, no murmur Lungs: clear to auscultation bilaterally Abdomen: soft, nontender, nondistended, no obvious ascites, no peritoneal signs, normal bowel sounds, no organomegaly Rectal: Omitted Extremities: no clubbing, cyanosis, or lower extremity edema bilaterally Skin: no lesions on visible extremities Neuro: No focal deficits.  Cranial nerves intact  ASSESSMENT:  1.  Chronic functional constipation 2.  Diverticulosis 3.  GERD PPI 4.  General medical problems.  Stable   PLAN:  1.  Recommended MiraLAX 2 scoops daily and 16 ounce of water.  Do this daily for 2 weeks.  If diarrhea then decrease to 1 scoop daily.  If she does not achieve desired effect, then increase MiraLAX.  We discussed proper titration of MiraLAX. 2.   High-fiber diet 3.  Reflux precautions 4.  Continue omeprazole 5.  Ongoing general medical care with Dr. Jenny Reichmann 6.  GI follow-up as needed Total time of 30 minutes was spent preparing to see the patient, reviewing data, obtaining comprehensive history, performing medically appropriate physical examination, counseling and educating patient regarding the above listed issues, directing medical therapy, and documenting clinical information in the health record

## 2022-07-19 LAB — CUP PACEART REMOTE DEVICE CHECK
Date Time Interrogation Session: 20230723000500
Implantable Pulse Generator Implant Date: 20200528

## 2022-07-20 ENCOUNTER — Ambulatory Visit (INDEPENDENT_AMBULATORY_CARE_PROVIDER_SITE_OTHER): Payer: Medicare Other

## 2022-07-20 DIAGNOSIS — I639 Cerebral infarction, unspecified: Secondary | ICD-10-CM

## 2022-07-29 DIAGNOSIS — Z5181 Encounter for therapeutic drug level monitoring: Secondary | ICD-10-CM | POA: Diagnosis not present

## 2022-07-29 DIAGNOSIS — F17200 Nicotine dependence, unspecified, uncomplicated: Secondary | ICD-10-CM | POA: Diagnosis not present

## 2022-07-29 DIAGNOSIS — M545 Low back pain, unspecified: Secondary | ICD-10-CM | POA: Diagnosis not present

## 2022-07-29 DIAGNOSIS — M791 Myalgia, unspecified site: Secondary | ICD-10-CM | POA: Diagnosis not present

## 2022-08-19 NOTE — Progress Notes (Signed)
Carelink Summary Report / Loop Recorder 

## 2022-08-24 ENCOUNTER — Ambulatory Visit (INDEPENDENT_AMBULATORY_CARE_PROVIDER_SITE_OTHER): Payer: Medicare Other

## 2022-08-24 DIAGNOSIS — I639 Cerebral infarction, unspecified: Secondary | ICD-10-CM

## 2022-08-25 LAB — CUP PACEART REMOTE DEVICE CHECK
Date Time Interrogation Session: 20230829074551
Implantable Pulse Generator Implant Date: 20200528

## 2022-08-26 ENCOUNTER — Ambulatory Visit: Payer: Medicare Other | Admitting: Internal Medicine

## 2022-08-29 ENCOUNTER — Other Ambulatory Visit: Payer: Self-pay | Admitting: Internal Medicine

## 2022-08-29 NOTE — Telephone Encounter (Signed)
Please refill as per office routine med refill policy (all routine meds to be refilled for 3 mo or monthly (per pt preference) up to one year from last visit, then month to month grace period for 3 mo, then further med refills will have to be denied) ? ?

## 2022-09-01 ENCOUNTER — Encounter: Payer: Self-pay | Admitting: Internal Medicine

## 2022-09-01 ENCOUNTER — Ambulatory Visit (INDEPENDENT_AMBULATORY_CARE_PROVIDER_SITE_OTHER): Payer: Medicare Other | Admitting: Internal Medicine

## 2022-09-01 ENCOUNTER — Ambulatory Visit (INDEPENDENT_AMBULATORY_CARE_PROVIDER_SITE_OTHER): Payer: Medicare Other

## 2022-09-01 VITALS — BP 126/64 | HR 87 | Temp 98.3°F | Ht 62.0 in | Wt 111.0 lb

## 2022-09-01 DIAGNOSIS — N1832 Chronic kidney disease, stage 3b: Secondary | ICD-10-CM | POA: Diagnosis not present

## 2022-09-01 DIAGNOSIS — E611 Iron deficiency: Secondary | ICD-10-CM | POA: Diagnosis not present

## 2022-09-01 DIAGNOSIS — R634 Abnormal weight loss: Secondary | ICD-10-CM

## 2022-09-01 DIAGNOSIS — Z0001 Encounter for general adult medical examination with abnormal findings: Secondary | ICD-10-CM | POA: Diagnosis not present

## 2022-09-01 DIAGNOSIS — J449 Chronic obstructive pulmonary disease, unspecified: Secondary | ICD-10-CM

## 2022-09-01 DIAGNOSIS — I700 Atherosclerosis of aorta: Secondary | ICD-10-CM

## 2022-09-01 DIAGNOSIS — I10 Essential (primary) hypertension: Secondary | ICD-10-CM | POA: Diagnosis not present

## 2022-09-01 DIAGNOSIS — E538 Deficiency of other specified B group vitamins: Secondary | ICD-10-CM

## 2022-09-01 DIAGNOSIS — F17200 Nicotine dependence, unspecified, uncomplicated: Secondary | ICD-10-CM | POA: Diagnosis not present

## 2022-09-01 DIAGNOSIS — R221 Localized swelling, mass and lump, neck: Secondary | ICD-10-CM | POA: Insufficient documentation

## 2022-09-01 DIAGNOSIS — Z8673 Personal history of transient ischemic attack (TIA), and cerebral infarction without residual deficits: Secondary | ICD-10-CM

## 2022-09-01 DIAGNOSIS — R739 Hyperglycemia, unspecified: Secondary | ICD-10-CM | POA: Diagnosis not present

## 2022-09-01 DIAGNOSIS — E559 Vitamin D deficiency, unspecified: Secondary | ICD-10-CM

## 2022-09-01 DIAGNOSIS — E785 Hyperlipidemia, unspecified: Secondary | ICD-10-CM | POA: Diagnosis not present

## 2022-09-01 LAB — URINALYSIS, ROUTINE W REFLEX MICROSCOPIC
Bilirubin Urine: NEGATIVE
Hgb urine dipstick: NEGATIVE
Ketones, ur: NEGATIVE
Leukocytes,Ua: NEGATIVE
Nitrite: NEGATIVE
RBC / HPF: NONE SEEN (ref 0–?)
Specific Gravity, Urine: 1.015 (ref 1.000–1.030)
Total Protein, Urine: 30 — AB
Urine Glucose: NEGATIVE
Urobilinogen, UA: 0.2 (ref 0.0–1.0)
pH: 5.5 (ref 5.0–8.0)

## 2022-09-01 LAB — BASIC METABOLIC PANEL
BUN: 34 mg/dL — ABNORMAL HIGH (ref 6–23)
CO2: 24 mEq/L (ref 19–32)
Calcium: 9.4 mg/dL (ref 8.4–10.5)
Chloride: 103 mEq/L (ref 96–112)
Creatinine, Ser: 1.78 mg/dL — ABNORMAL HIGH (ref 0.40–1.20)
GFR: 27 mL/min — ABNORMAL LOW (ref >60.00)
Glucose, Bld: 90 mg/dL (ref 70–99)
Potassium: 3.9 mEq/L (ref 3.5–5.1)
Sodium: 141 mEq/L (ref 135–145)

## 2022-09-01 LAB — FERRITIN: Ferritin: 48.8 ng/mL (ref 10.0–291.0)

## 2022-09-01 LAB — CBC WITH DIFFERENTIAL/PLATELET
Basophils Absolute: 0.1 10*3/uL (ref 0.0–0.1)
Basophils Relative: 1 % (ref 0.0–3.0)
Eosinophils Absolute: 0.2 10*3/uL (ref 0.0–0.7)
Eosinophils Relative: 1.9 % (ref 0.0–5.0)
HCT: 32.8 % — ABNORMAL LOW (ref 36.0–46.0)
Hemoglobin: 10.4 g/dL — ABNORMAL LOW (ref 12.0–15.0)
Lymphocytes Relative: 15.2 % (ref 12.0–46.0)
Lymphs Abs: 1.8 10*3/uL (ref 0.7–4.0)
MCHC: 31.7 g/dL (ref 30.0–36.0)
MCV: 73.4 fl — ABNORMAL LOW (ref 78.0–100.0)
Monocytes Absolute: 0.8 10*3/uL (ref 0.1–1.0)
Monocytes Relative: 7 % (ref 3.0–12.0)
Neutro Abs: 9 10*3/uL — ABNORMAL HIGH (ref 1.4–7.7)
Neutrophils Relative %: 74.9 % (ref 43.0–77.0)
Platelets: 405 10*3/uL — ABNORMAL HIGH (ref 150.0–400.0)
RBC: 4.47 Mil/uL (ref 3.87–5.11)
RDW: 18 % — ABNORMAL HIGH (ref 11.5–15.5)
WBC: 12 10*3/uL — ABNORMAL HIGH (ref 4.0–10.5)

## 2022-09-01 LAB — LIPID PANEL
Cholesterol: 178 mg/dL (ref 0–200)
HDL: 99.3 mg/dL (ref >39.00)
LDL Cholesterol: 51 mg/dL (ref 0–99)
NonHDL: 78.55
Total CHOL/HDL Ratio: 2
Triglycerides: 139 mg/dL (ref 0.0–149.0)
VLDL: 27.8 mg/dL (ref 0.0–40.0)

## 2022-09-01 LAB — IBC PANEL
Iron: 67 ug/dL (ref 42–145)
Saturation Ratios: 16 % — ABNORMAL LOW (ref 20.0–50.0)
TIBC: 420 ug/dL (ref 250.0–450.0)
Transferrin: 300 mg/dL (ref 212.0–360.0)

## 2022-09-01 LAB — HEPATIC FUNCTION PANEL
ALT: 22 U/L (ref 0–35)
AST: 21 U/L (ref 0–37)
Albumin: 4.4 g/dL (ref 3.5–5.2)
Alkaline Phosphatase: 63 U/L (ref 39–117)
Bilirubin, Direct: 0.1 mg/dL (ref 0.0–0.3)
Total Bilirubin: 0.3 mg/dL (ref 0.2–1.2)
Total Protein: 7.5 g/dL (ref 6.0–8.3)

## 2022-09-01 LAB — VITAMIN B12: Vitamin B-12: 166 pg/mL — ABNORMAL LOW (ref 211–911)

## 2022-09-01 LAB — SEDIMENTATION RATE: Sed Rate: 30 mm/hr (ref 0–30)

## 2022-09-01 LAB — TSH: TSH: 0.42 u[IU]/mL (ref 0.35–5.50)

## 2022-09-01 LAB — VITAMIN D 25 HYDROXY (VIT D DEFICIENCY, FRACTURES): VITD: 18.02 ng/mL — ABNORMAL LOW (ref 30.00–100.00)

## 2022-09-01 MED ORDER — PAROXETINE HCL 10 MG PO TABS: 10.0000 mg | ORAL_TABLET | Freq: Every day | ORAL | 3 refills | Status: DC

## 2022-09-01 NOTE — Assessment & Plan Note (Signed)
Last vitamin D Lab Results  Component Value Date   VD25OH 20.02 (L) 01/19/2022   Low, reminded to start oral replacement

## 2022-09-01 NOTE — Progress Notes (Signed)
Patient ID: Elaine Turner, female   DOB: Jun 22, 1946, 76 y.o.   MRN: [id number]         Chief Complaint:: wellness exam and sweating (And cold at the same time, tiredness, runny nose)  , anxiety depression, wt loss, smoking, hx of cva, right neck mass, low B12, ckd       HPI:  Elaine Turner is a 76 y.o. female here for wellness exam; plans to have shingrix at pharmacy with flu shot, o/w up to date                        Also conts to lose wt, lives with daughter with whom she does not get along (daughter with bipolar like patients husband), much more stress recently, several lbs wt loss for unclear reason though appetite has been less, continues to smoke and not ready to quit.  Pt denies chest pain, increased sob or doe, wheezing, orthopnea, PND, increased LE swelling, palpitations, dizziness or syncope.   Pt denies polydipsia, polyuria, or new focal neuro s/s.   Pt is not aware of lump to right neck.  Due for f/u with Stroke clinic as she missed her f/u appt then forgot to reschedule.  Not taking Vit D   Wt Readings from Last 3 Encounters:  09/01/22 111 lb (50.3 kg)  07/15/22 114 lb (51.7 kg)  06/10/22 115 lb 6.4 oz (52.3 kg)   BP Readings from Last 3 Encounters:  09/01/22 126/64  07/15/22 128/66  06/10/22 (!) 172/78   Immunization History  Administered Date(s) Administered   Fluad Quad(high Dose 65+) 09/27/2019, 02/06/2022   Influenza, High Dose Seasonal PF 01/06/2018, 11/11/2018   Influenza-Unspecified 09/29/2020   Pneumococcal Conjugate-13 04/13/2014, 10/04/2015, 09/27/2019   Pneumococcal Polysaccharide-23 03/25/2016   Tdap 08/28/2009   There are no preventive care reminders to display for this patient.     Past Medical History:  Diagnosis Date   AIN III (anal intraepithelial neoplasia III) 06/26/2015   AIN II-III of internal hemorrhoids   Aortic atherosclerosis (HCC)    Chronic diarrhea    COPD  GOLD 0 / active smoker  10/24/2015   Diverticulitis    Diverticulosis     Esophageal stricture    Heart murmur    Hemorrhoids    Hyperglycemia 05/01/2013   Hypertension    Ischemic optic neuropathy, left eye 05/25/2019   Pneumonia 05/01/2013   PVD (peripheral vascular disease) ([place])    Urine incontinence    Past Surgical History:  Procedure Laterality Date   ABDOMINAL HYSTERECTOMY     CARPAL TUNNEL RELEASE Right 09/2019   HEMORRHOID SURGERY N/A 06/26/2015   Procedure: INTERNAL AND EXTERNAL HEMORRHOIDECTOMY;  Surgeon: Georganna Skeans, MD;  Location: [facility];  Service: General;  Laterality: N/A;   LOOP RECORDER INSERTION N/A 05/25/2019   Procedure: LOOP RECORDER INSERTION;  Surgeon: Constance Haw, MD;  Location: [facility];  Service: Cardiovascular;  Laterality: N/A;    reports that she has been smoking cigarettes. She has a 62.00 pack-year smoking history. She has never used smokeless tobacco. She reports current alcohol use. She reports that she does not use drugs. family history includes Hypertension in her mother; Lung cancer in her father. Allergies  Allergen Reactions   Ace Inhibitors Cough   Levaquin [Levofloxacin] Other (See Comments)    GI upset   Current Outpatient Medications on File Prior to Visit  Medication Sig Dispense Refill   albuterol (VENTOLIN HFA) 108 (  90 Base) MCG/ACT inhaler TAKE 2 PUFFS BY MOUTH EVERY 6 HOURS AS NEEDED FOR WHEEZE OR SHORTNESS OF BREATH 6.7 each 2   aspirin 81 MG EC tablet Take 1 tablet (81 mg total) by mouth daily. Swallow whole. 30 tablet 12   atorvastatin (LIPITOR) 40 MG tablet TAKE 1 TABLET BY MOUTH DAILY AT 6 PM. 90 tablet 3   bisacodyl (DULCOLAX) 5 MG EC tablet Take 5 mg by mouth daily as needed for moderate constipation. Patient states she takes 6 a day when she is constipated     clopidogrel (PLAVIX) 75 MG tablet Take 1 tablet (75 mg total) by mouth daily. 90 tablet 3   furosemide (LASIX) 20 MG tablet Take 1 tablet (20 mg total) by mouth daily. 30 tablet 11   LORazepam (ATIVAN) 0.5  MG tablet 1-2 tabs 30 - 60 min prior to MRI. Do not drive with this medicine. 4 tablet 0   losartan (COZAAR) 50 MG tablet Take 1 tablet (50 mg total) by mouth daily. 90 tablet 3   NIFEdipine (PROCARDIA XL/NIFEDICAL-XL) 90 MG 24 hr tablet Take 1 tablet (90 mg total) by mouth daily. 90 tablet 3   omeprazole (PRILOSEC) 20 MG capsule Take 1 capsule (20 mg total) by mouth daily. 90 capsule 3   tiZANidine (ZANAFLEX) 2 MG tablet Take 1 tablet (2 mg total) by mouth every 6 (six) hours as needed for muscle spasms. 30 tablet 2   topiramate (TOPAMAX) 25 MG tablet Take 1 tablet (25 mg total) by mouth 2 (two) times daily. 180 tablet 1   traMADol (ULTRAM) 50 MG tablet Take 1 tablet (50 mg total) by mouth 3 (three) times daily as needed. 60 tablet 2   traZODone (DESYREL) 50 MG tablet Take 0.5-1 tablets (25-50 mg total) by mouth at bedtime as needed for sleep. 90 tablet 1   [DISCONTINUED] citalopram (CELEXA) 10 MG tablet Take 1 tablet (10 mg total) by mouth daily. 90 tablet 3   [DISCONTINUED] fluticasone (FLONASE) 50 MCG/ACT nasal spray Place 2 sprays into both nostrils daily. (Patient taking differently: Place 2 sprays into both nostrils as needed for rhinitis. ) 16 g 6   No current facility-administered medications on file prior to visit.        ROS:  All others reviewed and negative.  Objective        PE:  BP 126/64 (BP Location: Left Arm, Patient Position: Sitting, Cuff Size: Normal)   Pulse 87   Temp 98.3 F (36.8 C) (Oral)   Ht '5\' 2"'$  (1.575 m)   Wt 111 lb (50.3 kg)   SpO2 97%   BMI 20.30 kg/m                 Constitutional: Pt appears in NAD               HENT: Head: NCAT.                Right Ear: External ear normal.                 Left Ear: External ear normal.                Eyes: . Pupils are equal, round, and reactive to light. Conjunctivae and EOM are normal               Nose: without d/c or deformity               Neck: Neck supple. Gross normal ROM;  also noted firm subq mass approx  1.5 cm mid right neck , non mobile, nontneder, no other LA or mass noted               Cardiovascular: Normal rate and regular rhythm.                 Pulmonary/Chest: Effort normal and breath sounds without rales or wheezing.                Abd:  Soft, NT, ND, + BS, no organomegaly               Neurological: Pt is alert. At baseline orientation, motor grossly intact               Skin: Skin is warm. No rashes, no other new lesions, LE edema - none               Psychiatric: Pt behavior is normal without agitation   Micro: none  Cardiac tracings I have personally interpreted today:  none  Pertinent Radiological findings (summarize): none   Lab Results  Component Value Date   WBC 12.0 (H) 09/01/2022   HGB 10.4 (L) 09/01/2022   HCT 32.8 (L) 09/01/2022   PLT 405.0 (H) 09/01/2022   GLUCOSE 90 09/01/2022   CHOL 178 09/01/2022   TRIG 139.0 09/01/2022   HDL 99.30 09/01/2022   LDLCALC 51 09/01/2022   ALT 22 09/01/2022   AST 21 09/01/2022   NA 141 09/01/2022   K 3.9 09/01/2022   CL 103 09/01/2022   CREATININE 1.78 (H) 09/01/2022   BUN 34 (H) 09/01/2022   CO2 24 09/01/2022   TSH 0.42 09/01/2022   INR 1.1 05/24/2019   HGBA1C 5.7 01/19/2022   Assessment/Plan:  EARNSTINE MEINDERS is a 76 y.o. Black or African American [2] female with  has a past medical history of AIN III (anal intraepithelial neoplasia III) (06/26/2015), Aortic atherosclerosis ([place]), Chronic diarrhea, COPD  GOLD 0 / active smoker  (10/24/2015), Diverticulitis, Diverticulosis, Esophageal stricture, Heart murmur, Hemorrhoids, Hyperglycemia (05/01/2013), Hypertension, Ischemic optic neuropathy, left eye (05/25/2019), Pneumonia (05/01/2013), PVD (peripheral vascular disease) ([place]), and Urine incontinence.  Vitamin D deficiency Last vitamin D Lab Results  Component Value Date   VD25OH 20.02 (L) 01/19/2022   Low, reminded to start oral replacement   Encounter for well adult exam with abnormal findings Age and sex  appropriate education and counseling updated with regular exercise and diet Referrals for preventative services - none needed Immunizations addressed - for shingrix and fliu shot at the pharmacy Smoking counseling  - counsled to quit, pt not ready Evidence for depression or other mood disorder - new worsening anxiety depression - for paxil 10 mg qd Most recent labs reviewed. I have personally reviewed and have noted: 1) the patient's medical and social history 2) The patient's current medications and supplements 3) The patient's height, weight, and BMI have been recorded in the chart   History of completed stroke Stable overall, Due for f/u stroke clinic, - will refer back  Aortic atherosclerosis (HCC) Pt to continue lipitor 40 mg qd, exercise, low chol diet  B12 deficiency Lab Results  Component Value Date   VITAMINB12 166 (L) 09/01/2022   Low to start oral replacement - b12 1000 mcg qd   CKD (chronic kidney disease) stage 3, GFR 30-59 ml/min (HCC) Lab Results  Component Value Date   CREATININE 1.78 (H) 09/01/2022   Stable overall, cont to avoid nephrotoxins   COPD  GOLD 0 / active smoker  With recent wt loss - for cxr, consider LDCT  Hyperglycemia Lab Results  Component Value Date   HGBA1C 5.7 01/19/2022   Stable, pt to continue current medical treatment  - diet   Hyperlipidemia Lab Results  Component Value Date   LDLCALC 51 09/01/2022   Stable, pt to continue current statin lipitor 40 mg qd   Hypertension BP Readings from Last 3 Encounters:  09/01/22 126/64  07/15/22 128/66  06/10/22 (!) 172/78   Stable, pt to continue medical treatment losarta 50 gm qd, procardia xl 90 mg qd   Tobacco dependence Pt counsled to quit, pt not ready  Weight loss Persistent, most likely emotional related it seems, but for cxr and labs today as ordered  Mass of right side of neck ? Benign vs other, given wt loss, ok for ENT referral Followup: Return in about 6  months (around 03/02/2023).  Cathlean Cower, MD 09/03/2022 8:46 PM [facility]

## 2022-09-01 NOTE — Patient Instructions (Signed)
Please take all new medication as prescribed  - the paxil 10 mg per day  Please continue all other medications as before, and refills have been done if requested.  Please have the pharmacy call with any other refills you may need.  Please continue your efforts at being more active, low cholesterol diet, and weight control.  You are otherwise up to date with prevention measures today.  Please keep your appointments with your specialists as you may have planned  You will be contacted regarding the referral for: Dr Leonie Man-  neurology, and ENT  Please go to the XRAY Department in the first floor for the x-ray testing  Please go to the LAB at the blood drawing area for the tests to be done  You will be contacted by phone if any changes need to be made immediately.  Otherwise, you will receive a letter about your results with an explanation, but please check with MyChart first.  Please remember to sign up for MyChart if you have not done so, as this will be important to you in the future with finding out test results, communicating by private email, and scheduling acute appointments online when needed.  Please make an Appointment to return in 6 months, or sooner if needed

## 2022-09-03 ENCOUNTER — Encounter: Payer: Self-pay | Admitting: Internal Medicine

## 2022-09-03 NOTE — Assessment & Plan Note (Signed)
With recent wt loss - for cxr, consider LDCT

## 2022-09-03 NOTE — Assessment & Plan Note (Signed)
?   Benign vs other, given wt loss, ok for ENT referral

## 2022-09-03 NOTE — Assessment & Plan Note (Addendum)
Stable overall, Due for f/u stroke clinic, - will refer back

## 2022-09-03 NOTE — Assessment & Plan Note (Signed)
Persistent, most likely emotional related it seems, but for cxr and labs today as ordered

## 2022-09-03 NOTE — Assessment & Plan Note (Signed)
Pt to continue lipitor 40 mg qd, exercise, low chol diet

## 2022-09-03 NOTE — Assessment & Plan Note (Signed)
Lab Results  Component Value Date   LDLCALC 51 09/01/2022   Stable, pt to continue current statin lipitor 40 mg qd  

## 2022-09-03 NOTE — Assessment & Plan Note (Signed)
Lab Results  Component Value Date   VITAMINB12 166 (L) 09/01/2022   Low to start oral replacement - b12 1000 mcg qd

## 2022-09-03 NOTE — Assessment & Plan Note (Signed)
Pt counsled to quit, pt not ready °

## 2022-09-03 NOTE — Assessment & Plan Note (Signed)
Lab Results  Component Value Date   HGBA1C 5.7 01/19/2022   Stable, pt to continue current medical treatment  - diet

## 2022-09-03 NOTE — Assessment & Plan Note (Signed)
BP Readings from Last 3 Encounters:  09/01/22 126/64  07/15/22 128/66  06/10/22 (!) 172/78   Stable, pt to continue medical treatment losarta 50 gm qd, procardia xl 90 mg qd

## 2022-09-03 NOTE — Assessment & Plan Note (Addendum)
Age and sex appropriate education and counseling updated with regular exercise and diet Referrals for preventative services - none needed Immunizations addressed - for shingrix and fliu shot at the pharmacy Smoking counseling  - counsled to quit, pt not ready Evidence for depression or other mood disorder - new worsening anxiety depression - for paxil 10 mg qd Most recent labs reviewed. I have personally reviewed and have noted: 1) the patient's medical and social history 2) The patient's current medications and supplements 3) The patient's height, weight, and BMI have been recorded in the chart

## 2022-09-03 NOTE — Assessment & Plan Note (Signed)
Lab Results  Component Value Date   CREATININE 1.78 (H) 09/01/2022   Stable overall, cont to avoid nephrotoxins

## 2022-09-07 ENCOUNTER — Telehealth: Payer: Self-pay | Admitting: Internal Medicine

## 2022-09-07 NOTE — Telephone Encounter (Signed)
Patient called and said she was seen on 09/01/22 by Dr Jenny Reichmann and she said she is still not feeling any better, She said she nothing has changed and she still feels exactly the same and she wasn't sure what she should do. Call back is [phone]

## 2022-09-09 ENCOUNTER — Telehealth: Payer: Self-pay | Admitting: Internal Medicine

## 2022-09-09 ENCOUNTER — Other Ambulatory Visit: Payer: Self-pay | Admitting: Internal Medicine

## 2022-09-09 DIAGNOSIS — I10 Essential (primary) hypertension: Secondary | ICD-10-CM

## 2022-09-09 NOTE — Telephone Encounter (Signed)
Please refill as per office routine med refill policy (all routine meds to be refilled for 3 mo or monthly (per pt preference) up to one year from last visit, then month to month grace period for 3 mo, then further med refills will have to be denied) ? ?

## 2022-09-09 NOTE — Telephone Encounter (Signed)
I dont have anything specific to say based on this, except to consider immodium otc as needed and continue all other medication including the paxil

## 2022-09-09 NOTE — Telephone Encounter (Signed)
Spoke with patient and she states that she is still not well. She feels sluggish and has no energy, frequent bowel movements. Please advise.

## 2022-09-09 NOTE — Telephone Encounter (Signed)
Pt is requesting a refill of her losartan (COZAAR) 50 MG tablet RX. Pt is out of medication.   Last OV 09-01-22  Please send to [id number] Phone:  [phone] Fax:  [phone]

## 2022-09-09 NOTE — Telephone Encounter (Signed)
Ok Please refill as per office routine med refill policy (all routine meds to be refilled for 3 mo or monthly (per pt preference) up to one year from last visit, then month to month grace period for 3 mo, then further med refills will have to be denied)

## 2022-09-10 MED ORDER — LOSARTAN POTASSIUM 50 MG PO TABS: 50.0000 mg | ORAL_TABLET | Freq: Every day | ORAL | 3 refills | Status: DC

## 2022-09-10 MED ORDER — LORAZEPAM 0.5 MG PO TABS: ORAL_TABLET | ORAL | 0 refills | Status: AC

## 2022-09-10 NOTE — Addendum Note (Signed)
Addended by: Max Sane on: 09/10/2022 10:06 AM   Modules accepted: Orders

## 2022-09-14 NOTE — Telephone Encounter (Signed)
Patient updated.

## 2022-09-16 NOTE — Progress Notes (Signed)
Carelink Summary Report / Loop Recorder 

## 2022-09-28 ENCOUNTER — Ambulatory Visit (INDEPENDENT_AMBULATORY_CARE_PROVIDER_SITE_OTHER): Payer: Medicare Other

## 2022-09-28 DIAGNOSIS — I639 Cerebral infarction, unspecified: Secondary | ICD-10-CM | POA: Diagnosis not present

## 2022-09-29 LAB — CUP PACEART REMOTE DEVICE CHECK
Date Time Interrogation Session: 20231001231544
Implantable Pulse Generator Implant Date: 20200528

## 2022-10-12 NOTE — Progress Notes (Signed)
Carelink Summary Report / Loop Recorder 

## 2022-10-15 ENCOUNTER — Telehealth: Payer: Self-pay | Admitting: Internal Medicine

## 2022-10-15 NOTE — Telephone Encounter (Signed)
N/A when calling to schedule AWV-I with [facility]

## 2022-10-19 ENCOUNTER — Ambulatory Visit: Payer: Medicare Other | Admitting: Internal Medicine

## 2022-10-29 NOTE — Progress Notes (Signed)
10/30/2022 NYJA WESTBROOK [id number] 02-19-1946  Referring provider: Biagio Borg, MD Primary GI doctor: Dr. Henrene Pastor  ASSESSMENT AND PLAN:   Esophageal dysphagia to liquids Had EGD 2021, is worse with liquids, no aspiration Will proceed with barium swallow to evaluate, likely component of age/GERD  Irritable bowel syndrome with constipation Normal colon, long standing history Some incontinence with miralax only, had good result with linzess years ago but was too expensive, will retry 72 and 144mg If too expensive can do miralax with fiber, likely component of pelvis floor dysfunction  B12 deficiency Start on B12 sublingual, will recheck next OV If not better consider shots Possibly contributing to some of her symptoms  Rash At gluteal cleft, no abscess, no warmth, no signs of yeast Will treat with hydrocortisone and follow up with PCP, may need trial of nystatin versus biopsy  2-3 months OV   History of Present Illness:  76y.o. female  with a past medical history of COPD, peripheral vascular disease on Plavix, AIN 3, internal hemorrhoids,  diverticulosis, GERD with esophageal stricture and others listed below, returns to clinic today for evaluation of constipation, gas, bloating, mass/sore spot on buttocks..  10/2020 endoscopy and colonoscopy for microcytic anemia and abdominal pain Upper endoscopy revealed mild esophagitis and a small hernia.   Colonoscopy revealed pandiverticulosis and internal hemorrhoids  10/2020 CT scan diverticulosis otherwise unremarkable 07/15/2022 office visit with Dr. PHenrene Pastorfor functional constipation suggested high-fiber diet, MiraLAX and follow-up as needed. 08/2022 Labs reviewed showed normal iron, ferritin, very low B12, normal thyroid and sed rate.  She states she is sweating with eating any food.  No GERD, no nausea, vomiting, no palpitations, no chest discomfort, no SOB.  States she can drink water and get choked, no issues with pills or  food. Denies coughing while eating or drinking.  She is feeling pressure for Bm's but will not be able to go, and she can have fecal incontinence most of the time is hard bowel with lower AB pain, urgency.  She tried linzess in the past, it did work for her but was too expensive.    She  reports that she has been smoking cigarettes. She has a 62.00 pack-year smoking history. She has never used smokeless tobacco. She reports current alcohol use. She reports that she does not use drugs. Her family history includes Hypertension in her mother; Lung cancer in her father.   Current Medications:    Current Outpatient Medications (Cardiovascular):    atorvastatin (LIPITOR) 40 MG tablet, TAKE 1 TABLET BY MOUTH DAILY AT 6 PM. (Patient not taking: Reported on 10/30/2022)   furosemide (LASIX) 20 MG tablet, Take 1 tablet (20 mg total) by mouth daily. (Patient not taking: Reported on 10/30/2022)   losartan (COZAAR) 50 MG tablet, Take 1 tablet (50 mg total) by mouth daily. (Patient not taking: Reported on 10/30/2022)   NIFEdipine (PROCARDIA XL/NIFEDICAL-XL) 90 MG 24 hr tablet, TAKE 1 TABLET BY MOUTH EVERY DAY (Patient not taking: Reported on 10/30/2022)  Current Outpatient Medications (Respiratory):    albuterol (VENTOLIN HFA) 108 (90 Base) MCG/ACT inhaler, TAKE 2 PUFFS BY MOUTH EVERY 6 HOURS AS NEEDED FOR WHEEZE OR SHORTNESS OF BREATH (Patient not taking: Reported on 10/30/2022)  Current Outpatient Medications (Analgesics):    aspirin 81 MG EC tablet, Take 1 tablet (81 mg total) by mouth daily. Swallow whole. (Patient not taking: Reported on 10/30/2022)   traMADol (ULTRAM) 50 MG tablet, Take 1 tablet (50 mg total) by mouth 3 (three)  times daily as needed. (Patient not taking: Reported on 10/30/2022)  Current Outpatient Medications (Hematological):    clopidogrel (PLAVIX) 75 MG tablet, Take 1 tablet (75 mg total) by mouth daily. (Patient not taking: Reported on 10/30/2022)  Current Outpatient Medications  (Other):    hydrocortisone (ANUSOL-HC) 2.5 % rectal cream, Place 1 Application rectally 2 (two) times daily.   bisacodyl (DULCOLAX) 5 MG EC tablet, Take 5 mg by mouth daily as needed for moderate constipation. Patient states she takes 6 a day when she is constipated (Patient not taking: Reported on 10/30/2022)   LORazepam (ATIVAN) 0.5 MG tablet, 1-2 tabs 30 - 60 min prior to MRI. Do not drive with this medicine. (Patient not taking: Reported on 10/30/2022)   omeprazole (PRILOSEC) 20 MG capsule, Take 1 capsule (20 mg total) by mouth daily. (Patient not taking: Reported on 10/30/2022)   PARoxetine (PAXIL) 10 MG tablet, Take 1 tablet (10 mg total) by mouth daily. (Patient not taking: Reported on 10/30/2022)   tiZANidine (ZANAFLEX) 2 MG tablet, Take 1 tablet (2 mg total) by mouth every 6 (six) hours as needed for muscle spasms. (Patient not taking: Reported on 10/30/2022)   topiramate (TOPAMAX) 25 MG tablet, Take 1 tablet (25 mg total) by mouth 2 (two) times daily. (Patient not taking: Reported on 10/30/2022)   traZODone (DESYREL) 50 MG tablet, Take 0.5-1 tablets (25-50 mg total) by mouth at bedtime as needed for sleep. (Patient not taking: Reported on 10/30/2022)  Surgical History:  She  has a past surgical history that includes Abdominal hysterectomy; Hemorrhoid surgery (N/A, 06/26/2015); LOOP RECORDER INSERTION (N/A, 05/25/2019); and Carpal tunnel release (Right, 09/2019).  Current Medications, Allergies, Past Medical History, Past Surgical History, Family History and Social History were reviewed in [employer] record.  Physical Exam: BP 130/68   Pulse 92   Ht '5\' 2"'$  (1.575 m)   Wt 113 lb (51.3 kg)   SpO2 98%   BMI 20.67 kg/m  General:   Pleasant, well developed female in no acute distress Heart : Regular rate and rhythm; no murmurs Pulm: Clear anteriorly; no wheezing Abdomen:  Soft, Obese AB, Sluggish bowel sounds. mild tenderness in the lower abdomen. Without guarding and  Without rebound, No organomegaly appreciated. Rectal: area of concern for the patient is small scaly slightly raised 5-6 mm area on gluteal cleft, tender, no induration, no redness, warmth, swelling. Normal external rectal exam.  Extremities:  without  edema. Neurologic:  Alert and  oriented x4;  No focal deficits.  Psych:  Cooperative. Normal mood and affect.   Vladimir Crofts, PA-C 10/30/22

## 2022-10-30 ENCOUNTER — Ambulatory Visit: Payer: Medicare Other | Admitting: Physician Assistant

## 2022-10-30 ENCOUNTER — Encounter: Payer: Self-pay | Admitting: Physician Assistant

## 2022-10-30 VITALS — BP 130/68 | HR 92 | Ht 62.0 in | Wt 113.0 lb

## 2022-10-30 DIAGNOSIS — E538 Deficiency of other specified B group vitamins: Secondary | ICD-10-CM

## 2022-10-30 DIAGNOSIS — R1319 Other dysphagia: Secondary | ICD-10-CM | POA: Diagnosis not present

## 2022-10-30 DIAGNOSIS — K581 Irritable bowel syndrome with constipation: Secondary | ICD-10-CM | POA: Diagnosis not present

## 2022-10-30 DIAGNOSIS — R21 Rash and other nonspecific skin eruption: Secondary | ICD-10-CM

## 2022-10-30 MED ORDER — HYDROCORTISONE (PERIANAL) 2.5 % EX CREA: 1.0000 | TOPICAL_CREAM | Freq: Two times a day (BID) | CUTANEOUS | 2 refills | Status: AC

## 2022-10-30 NOTE — Progress Notes (Signed)
Noted  

## 2022-10-30 NOTE — Patient Instructions (Addendum)
Please follow up in 2 to 3 months. We will call you when we have January schedule out. If you do not hear from [place], Give [place] a call at [phone] to schedule an appointment in mid December.  You have been scheduled for a Barium Esophogram at [facility] (1st floor of the hospital) on 11/12/22 at 900 AM. Please arrive 30 minutes prior to your appointment for registration. Make certain not to have anything to eat or drink 3 hours prior to your test. If you need to reschedule for any reason, please contact radiology at [phone] to do so. __________________________________________________________________ A barium swallow is an examination that concentrates on views of the esophagus. This tends to be a double contrast exam (barium and two liquids which, when combined, create a gas to distend the wall of the oesophagus) or single contrast (non-ionic iodine based). The study is usually tailored to your symptoms so a good history is essential. Attention is paid during the study to the form, structure and configuration of the esophagus, looking for functional disorders (such as aspiration, dysphagia, achalasia, motility and reflux) EXAMINATION You may be asked to change into a gown, depending on the type of swallow being performed. A radiologist and radiographer will perform the procedure. The radiologist will advise you of the type of contrast selected for your procedure and direct you during the exam. You will be asked to stand, sit or lie in several different positions and to hold a small amount of fluid in your mouth before being asked to swallow while the imaging is performed .In some instances you may be asked to swallow barium coated marshmallows to assess the motility of a solid food bolus. The exam can be recorded as a digital or video fluoroscopy procedure. POST PROCEDURE It will take 1-2 days for the barium to pass through your system. To facilitate this, it is important, unless otherwise  directed, to increase your fluids for the next 24-48hrs and to resume your normal diet.  This test typically takes about 30 minutes to perform. __________________________________________________________________________________   B12 is mainly in meat, so increase meat can help this but often people have a deficiency that they are just not absorbing it well with meat or pills, so get the sublingual/melt in your mouth one.  GET ON A SUBLINGUAL OVER THE COUNTER B12, 1000-2000 MCG A DAY AT LEAST WILL RECHECK IN 1-2 MONTHS If the sublingual one dose not increase your level, we will discuss shots.   Stop the neosporin, start on hydrocortisone Follow up with PCP about sore on cleft  Linzess 72 MCG , CAN INCREASE TO 145 MCG IF NOT ENOUGH *IBS-C patients may begin to experience relief from belly pain and overall abdominal symptoms (pain, discomfort, and bloating) in about 1 week,  with symptoms typically improving over 12 weeks.  Take at least 30 minutes before the first meal of the day on an empty stomach You can have a loose stool if you eat a high-fat breakfast. Give it at least 7 days, may have more bowel movements during that time.   The diarrhea should go away and you should start having normal, complete, full bowel movements.  It may be helpful to start treatment when you can be near the comfort of your own bathroom, such as a weekend.  After you are out we can send in a prescription if you did well, there is a prescription card  IF LINZESS IS TOO EXPENSIVE DO [place]  is an osmotic laxative.  It only brings more water into the stool.  This is safe to take daily.  Can take up to 17 gram of miralax twice a day.  Mix with juice or coffee.  Start 1 capful at night for 3-4 days and reassess your response in 3-4 days.  You can increase and decrease the dose based on your response.  Remember, it can take up to 3-4 days to take effect OR for the effects to  wear off.   I often pair this with benefiber in the morning to help assure the stool is not too loose.    Vitamin B12 Deficiency Vitamin B12 deficiency occurs when the body does not have enough vitamin B12, which is an important vitamin. The body needs this vitamin: To make red blood cells. To make DNA. This is the genetic material inside cells. To help the nerves work properly so they can carry messages from the brain to the body. Vitamin B12 deficiency can cause various health problems, such as a low red blood cell count (anemia) or nerve damage. What are the causes? This condition may be caused by: Not eating enough foods that contain vitamin B12. Not having enough stomach acid and digestive fluids to properly absorb vitamin B12 from the food that you eat. Certain digestive system diseases that make it hard to absorb vitamin B12. These diseases include Crohn's disease, chronic pancreatitis, and cystic fibrosis. A condition in which the body does not make enough of a protein (intrinsic factor), resulting in too few red blood cells (pernicious anemia). Having a surgery in which part of the stomach or small intestine is removed. Taking certain medicines that make it hard for the body to absorb vitamin B12. These medicines include: Heartburn medicines (antacids and proton pump inhibitors). Certain antibiotic medicines. Some medicines that are used to treat diabetes, tuberculosis, gout, or high cholesterol. What increases the risk? The following factors may make you more likely to develop a B12 deficiency: Being older than age 68. Eating a vegetarian or vegan diet, especially while you are pregnant. Eating a poor diet while you are pregnant. Taking certain medicines. Having alcoholism. What are the signs or symptoms? In some cases, there are no symptoms of this condition. If the condition leads to anemia or nerve damage, various symptoms can occur, such as: Weakness. Fatigue. Loss of  appetite. Weight loss. Numbness or tingling in your hands and feet. Redness and burning of the tongue. Confusion or memory problems. Depression. Sensory problems, such as color blindness, ringing in the ears, or loss of taste. Diarrhea or constipation. Trouble walking. If anemia is severe, symptoms can include: Shortness of breath. Dizziness. Rapid heart rate (tachycardia). How is this diagnosed? This condition may be diagnosed with a blood test to measure the level of vitamin B12 in your blood. You may also have other tests, including: A group of tests that measure certain characteristics of blood cells (complete blood count, CBC). A blood test to measure intrinsic factor. A procedure where a thin tube with a camera on the end is used to look into your stomach or intestines (endoscopy). Other tests may be needed to discover the cause of B12 deficiency. How is this treated? Treatment for this condition depends on the cause. This condition may be treated by: Changing your eating and drinking habits, such as: Eating more foods that contain vitamin B12. Drinking less alcohol or no alcohol. Getting vitamin B12 injections. Taking vitamin B12 supplements. Your health care provider will  tell you which dosage is best for you. Follow these instructions at home: Eating and drinking  Eat lots of healthy foods that contain vitamin B12, including: Meats and poultry. This includes beef, pork, chicken, [place], and organ meats, such as liver. Seafood. This includes clams, rainbow trout, salmon, tuna, and haddock. Eggs. Cereal and dairy products that are fortified. This means that vitamin B12 has been added to the food. Check the label on the package to see if the food is fortified. The items listed above may not be a complete list of recommended foods and beverages. Contact a dietitian for more information. General instructions Get any injections that are prescribed by your health care  provider. Take supplements only as told by your health care provider. Follow the directions carefully. Do not drink alcohol if your health care provider tells you not to. In some cases, you may only be asked to limit alcohol use. Keep all follow-up visits as told by your health care provider. This is important. Contact a health care provider if: Your symptoms come back. Get help right away if you: Develop shortness of breath. Have a rapid heart rate. Have chest pain. Become dizzy or lose consciousness. Summary Vitamin B12 deficiency occurs when the body does not have enough vitamin B12. The main causes of vitamin B12 deficiency include dietary deficiency, digestive diseases, pernicious anemia, and having a surgery in which part of the stomach or small intestine is removed. In some cases, there are no symptoms of this condition. If the condition leads to anemia or nerve damage, various symptoms can occur, such as weakness, shortness of breath, and numbness. Treatment may include getting vitamin B12 injections or taking vitamin B12 supplements. Eat lots of healthy foods that contain vitamin B12. This information is not intended to replace advice given to you by your health care provider. Make sure you discuss any questions you have with your health care provider. Document Revised: 06/02/2019 Document Reviewed: 08/23/2018 Elsevier Patient Education  [place].    - Drink at least 64-80 ounces of water/liquid per day. - Establish a time to try to move your bowels every day.  For many people, this is after a cup of coffee or after a meal such as breakfast. - Sit all of the way back on the toilet keeping your back fairly straight and while sitting up, try to rest the tops of your forearms on your upper thighs.   - Raising your feet with a step stool/squatty potty can be helpful to improve the angle that allows your stool to pass through the rectum. - Relax the rectum feeling it bulge  toward the toilet water.  If you feel your rectum raising toward your body, you are contracting rather than relaxing. - Breathe in and slowly exhale. "Belly breath" by expanding your belly towards your belly button. Keep belly expanded as you gently direct pressure down and back to the anus.  A low pitched GRRR sound can assist with increasing intra-abdominal pressure.  - Repeat 3-4 times. If unsuccessful, contract the pelvic floor to restore normal tone and get off the toilet.  Avoid excessive straining. - To reduce excessive wiping by teaching your anus to normally contract, place hands on outer aspect of knees and resist knee movement outward.  Hold 5-10 second then place hands just inside of knees and resist inward movement of knees.  Hold 5 seconds.  Repeat a few times each way.  Go to the ER if unable to pass gas, severe  AB pain, unable to hold down food, any shortness of breath of chest pain.  Diverticulosis Diverticulosis is a condition that develops when small pouches (diverticula) form in the wall of the large intestine (colon). The colon is where water is absorbed and stool (feces) is formed. The pouches form when the inside layer of the colon pushes through weak spots in the outer layers of the colon. You may have a few pouches or many of them. The pouches usually do not cause problems unless they become inflamed or infected. When this happens, the condition is called diverticulitis- this is left lower quadrant pain, diarrhea, fever, chills, nausea or vomiting.  If this occurs please call the office or go to the hospital. Sometimes these patches without inflammation can also have painless bleeding associated with them, if this happens please call the office or go to the hospital. Preventing constipation and increasing fiber can help reduce diverticula and prevent complications. Even if you feel you have a high-fiber diet, suggest getting on Benefiber or Cirtracel 2 times daily.

## 2022-11-02 ENCOUNTER — Ambulatory Visit (INDEPENDENT_AMBULATORY_CARE_PROVIDER_SITE_OTHER): Payer: Medicare Other

## 2022-11-02 DIAGNOSIS — I639 Cerebral infarction, unspecified: Secondary | ICD-10-CM

## 2022-11-02 LAB — CUP PACEART REMOTE DEVICE CHECK
Date Time Interrogation Session: 20231105232528
Implantable Pulse Generator Implant Date: 20200528

## 2022-11-12 ENCOUNTER — Inpatient Hospital Stay (HOSPITAL_COMMUNITY)
Admission: RE | Admit: 2022-11-12 | Discharge: 2022-11-12 | Disposition: A | Discharge status: Home / Self Care | Payer: Medicare Other | Source: Ambulatory Visit | Attending: Physician Assistant | Admitting: Physician Assistant

## 2022-11-23 ENCOUNTER — Other Ambulatory Visit (HOSPITAL_COMMUNITY): Payer: Medicare Other

## 2022-11-30 ENCOUNTER — Ambulatory Visit: Payer: Medicare Other | Admitting: Internal Medicine

## 2022-11-30 NOTE — Progress Notes (Signed)
Carelink Summary Report / Loop Recorder 

## 2022-12-07 ENCOUNTER — Ambulatory Visit (INDEPENDENT_AMBULATORY_CARE_PROVIDER_SITE_OTHER): Payer: Medicare Other

## 2022-12-07 DIAGNOSIS — I639 Cerebral infarction, unspecified: Secondary | ICD-10-CM | POA: Diagnosis not present

## 2022-12-07 LAB — CUP PACEART REMOTE DEVICE CHECK
Date Time Interrogation Session: 20231210233029
Implantable Pulse Generator Implant Date: 20200528

## 2022-12-11 ENCOUNTER — Other Ambulatory Visit: Payer: Self-pay

## 2022-12-14 ENCOUNTER — Telehealth: Payer: Self-pay

## 2022-12-14 NOTE — Telephone Encounter (Signed)
Called patient and left message to schedule 2 months follow up appointment with Vicie Mutters, PA for dysphagia, and IBS with constipation. Will continue making efforts.

## 2022-12-23 NOTE — Telephone Encounter (Signed)
Called patient back again to schedule 2 months follow up appointment with Vicie Mutters, PA. Unable to leave VM.

## 2022-12-24 NOTE — Telephone Encounter (Signed)
Patient called back, scheduled recall appointment for 2 months follow up on 1/25 at 9:30 am.

## 2022-12-25 ENCOUNTER — Ambulatory Visit (INDEPENDENT_AMBULATORY_CARE_PROVIDER_SITE_OTHER): Payer: Medicare Other | Admitting: Internal Medicine

## 2022-12-25 ENCOUNTER — Encounter: Payer: Self-pay | Admitting: Internal Medicine

## 2022-12-25 VITALS — BP 118/64 | HR 81 | Temp 98.2°F | Ht 62.0 in | Wt 113.0 lb

## 2022-12-25 DIAGNOSIS — H5462 Unqualified visual loss, left eye, normal vision right eye: Secondary | ICD-10-CM | POA: Insufficient documentation

## 2022-12-25 DIAGNOSIS — I10 Essential (primary) hypertension: Secondary | ICD-10-CM | POA: Diagnosis not present

## 2022-12-25 DIAGNOSIS — M545 Low back pain, unspecified: Secondary | ICD-10-CM | POA: Insufficient documentation

## 2022-12-25 DIAGNOSIS — L989 Disorder of the skin and subcutaneous tissue, unspecified: Secondary | ICD-10-CM

## 2022-12-25 DIAGNOSIS — M5442 Lumbago with sciatica, left side: Secondary | ICD-10-CM

## 2022-12-25 MED ORDER — CLOBETASOL PROPIONATE 0.05 % EX CREA: 1.0000 | TOPICAL_CREAM | Freq: Two times a day (BID) | CUTANEOUS | 1 refills | Status: DC

## 2022-12-25 MED ORDER — GABAPENTIN 100 MG PO CAPS: 100.0000 mg | ORAL_CAPSULE | Freq: Three times a day (TID) | ORAL | 3 refills | Status: AC

## 2022-12-25 NOTE — Assessment & Plan Note (Signed)
Pain c/w referred pain vs sciatica - for trial gabapentin 100 tid,  to f/u any worsening symptoms or concerns

## 2022-12-25 NOTE — Progress Notes (Signed)
Patient ID: CHIQUITA HECKERT, female   DOB: [birth year], 76 y.o.   MRN: [id number]        Chief Complaint: follow up right buttock skin lesion, left sciatica, and persistent left vision partial loss, htn       HPI:  KASHMIR LEEDY is a 76 y.o. female here with c/o 2 mo onset right buttock skin lesion with tender pain after more sitting and even sleeping in the chair at home for the last several months; Has seen UC and rx triamcinolone cr which helped somewhat but still persists with some scaliness to the skin overlying, but no ulcer.  Pt denies chest pain, increased sob or doe, wheezing, orthopnea, PND, increased LE swelling, palpitations, dizziness or syncope.   Pt denies polydipsia, polyuria, or new focal neuro s/s.   Pt continues to have recurring left LBP without change in severity, bowel or bladder change, fever, wt loss,  worsening LE pain/numbness/weakness, gait change or falls, except for recurring left sided pain wrapping around to the left upper anterior leg.  Also metions hx of strok in the eye with permanent vision loss, but cannot recall the optho and due for yearly f/u exam       Wt Readings from Last 3 Encounters:  12/25/22 113 lb (51.3 kg)  10/30/22 113 lb (51.3 kg)  09/01/22 111 lb (50.3 kg)   BP Readings from Last 3 Encounters:  12/25/22 118/64  10/30/22 130/68  09/01/22 126/64         Past Medical History:  Diagnosis Date   AIN III (anal intraepithelial neoplasia III) 06/26/2015   AIN II-III of internal hemorrhoids   Aortic atherosclerosis (HCC)    Chronic diarrhea    COPD  GOLD 0 / active smoker  10/24/2015   Diverticulitis    Diverticulosis    Esophageal stricture    Heart murmur    Hemorrhoids    Hyperglycemia 05/01/2013   Hypertension    Ischemic optic neuropathy, left eye 05/25/2019   Pneumonia 05/01/2013   PVD (peripheral vascular disease) ([place])    Urine incontinence    Past Surgical History:  Procedure Laterality Date   ABDOMINAL HYSTERECTOMY     CARPAL  TUNNEL RELEASE Right 09/2019   HEMORRHOID SURGERY N/A 06/26/2015   Procedure: INTERNAL AND EXTERNAL HEMORRHOIDECTOMY;  Surgeon: Georganna Skeans, MD;  Location: [facility];  Service: General;  Laterality: N/A;   LOOP RECORDER INSERTION N/A 05/25/2019   Procedure: LOOP RECORDER INSERTION;  Surgeon: Constance Haw, MD;  Location: [facility];  Service: Cardiovascular;  Laterality: N/A;    reports that she has been smoking cigarettes. She has a 62.00 pack-year smoking history. She has never used smokeless tobacco. She reports current alcohol use. She reports that she does not use drugs. family history includes Hypertension in her mother; Lung cancer in her father. Allergies  Allergen Reactions   Ace Inhibitors Cough   Levaquin [Levofloxacin] Other (See Comments)    GI upset   Current Outpatient Medications on File Prior to Visit  Medication Sig Dispense Refill   atorvastatin (LIPITOR) 40 MG tablet TAKE 1 TABLET BY MOUTH DAILY AT 6 PM. 90 tablet 3   clopidogrel (PLAVIX) 75 MG tablet Take 1 tablet (75 mg total) by mouth daily. 90 tablet 3   furosemide (LASIX) 20 MG tablet Take 1 tablet (20 mg total) by mouth daily. 30 tablet 11   hydrocortisone (ANUSOL-HC) 2.5 % rectal cream Place 1 Application rectally 2 (two) times daily. [place]  g 2   LORazepam (ATIVAN) 0.5 MG tablet 1-2 tabs 30 - 60 min prior to MRI. Do not drive with this medicine. 4 tablet 0   losartan (COZAAR) 50 MG tablet Take 1 tablet (50 mg total) by mouth daily. 90 tablet 3   NIFEdipine (PROCARDIA XL/NIFEDICAL-XL) 90 MG 24 hr tablet TAKE 1 TABLET BY MOUTH EVERY DAY 90 tablet 3   tiZANidine (ZANAFLEX) 2 MG tablet Take 1 tablet (2 mg total) by mouth every 6 (six) hours as needed for muscle spasms. 30 tablet 2   traMADol (ULTRAM) 50 MG tablet Take 1 tablet (50 mg total) by mouth 3 (three) times daily as needed. 60 tablet 2   traZODone (DESYREL) 50 MG tablet Take 0.5-1 tablets (25-50 mg total) by mouth at bedtime as  needed for sleep. 90 tablet 1   albuterol (VENTOLIN HFA) 108 (90 Base) MCG/ACT inhaler TAKE 2 PUFFS BY MOUTH EVERY 6 HOURS AS NEEDED FOR WHEEZE OR SHORTNESS OF BREATH (Patient not taking: Reported on 10/30/2022) 6.7 each 2   aspirin 81 MG EC tablet Take 1 tablet (81 mg total) by mouth daily. Swallow whole. (Patient not taking: Reported on 10/30/2022) 30 tablet 12   bisacodyl (DULCOLAX) 5 MG EC tablet Take 5 mg by mouth daily as needed for moderate constipation. Patient states she takes 6 a day when she is constipated (Patient not taking: Reported on 10/30/2022)     omeprazole (PRILOSEC) 20 MG capsule Take 1 capsule (20 mg total) by mouth daily. (Patient not taking: Reported on 10/30/2022) 90 capsule 3   PARoxetine (PAXIL) 10 MG tablet Take 1 tablet (10 mg total) by mouth daily. (Patient not taking: Reported on 10/30/2022) 90 tablet 3   topiramate (TOPAMAX) 25 MG tablet Take 1 tablet (25 mg total) by mouth 2 (two) times daily. (Patient not taking: Reported on 10/30/2022) 180 tablet 1   [DISCONTINUED] citalopram (CELEXA) 10 MG tablet Take 1 tablet (10 mg total) by mouth daily. 90 tablet 3   [DISCONTINUED] fluticasone (FLONASE) 50 MCG/ACT nasal spray Place 2 sprays into both nostrils daily. (Patient taking differently: Place 2 sprays into both nostrils as needed for rhinitis. ) 16 g 6   No current facility-administered medications on file prior to visit.        ROS:  All others reviewed and negative.  Objective        PE:  BP 118/64 (BP Location: Right Arm, Patient Position: Sitting, Cuff Size: Large)   Pulse 81   Temp 98.2 F (36.8 C) (Oral)   Ht '5\' 2"'$  (1.575 m)   Wt 113 lb (51.3 kg)   SpO2 97%   BMI 20.67 kg/m                 Constitutional: Pt appears in NAD               HENT: Head: NCAT.                Right Ear: External ear normal.                 Left Ear: External ear normal.                Eyes: . Pupils are equal, round, and reactive to light. Conjunctivae and EOM are normal                Nose: without d/c or deformity               Neck:  Neck supple. Gross normal ROM               Cardiovascular: Normal rate and regular rhythm.                 Pulmonary/Chest: Effort normal and breath sounds without rales or wheezing.                Abd:  Soft, NT, ND, + BS, no organomegaly               Right buttock over the ischial tuberosity has 1.5 cm area linear fashion caudal to distal but with overlying scaly lesion and tenderness; no ulcer or erythema otherwise               Neurological: Pt is alert. At baseline orientation, motor grossly intact               Skin: Skin is warm. No rashes, no other new lesions, LE edema - none               Psychiatric: Pt behavior is normal without agitation   Micro: none  Cardiac tracings I have personally interpreted today:  none  Pertinent Radiological findings (summarize): none   Lab Results  Component Value Date   WBC 12.0 (H) 09/01/2022   HGB 10.4 (L) 09/01/2022   HCT 32.8 (L) 09/01/2022   PLT 405.0 (H) 09/01/2022   GLUCOSE 90 09/01/2022   CHOL 178 09/01/2022   TRIG 139.0 09/01/2022   HDL 99.30 09/01/2022   LDLCALC 51 09/01/2022   ALT 22 09/01/2022   AST 21 09/01/2022   NA 141 09/01/2022   K 3.9 09/01/2022   CL 103 09/01/2022   CREATININE 1.78 (H) 09/01/2022   BUN 34 (H) 09/01/2022   CO2 24 09/01/2022   TSH 0.42 09/01/2022   INR 1.1 05/24/2019   HGBA1C 5.7 01/19/2022   Assessment/Plan:  LAQUAN LUDDEN is a 76 y.o. Black or African American [2] female with  has a past medical history of AIN III (anal intraepithelial neoplasia III) (06/26/2015), Aortic atherosclerosis ([place]), Chronic diarrhea, COPD  GOLD 0 / active smoker  (10/24/2015), Diverticulitis, Diverticulosis, Esophageal stricture, Heart murmur, Hemorrhoids, Hyperglycemia (05/01/2013), Hypertension, Ischemic optic neuropathy, left eye (05/25/2019), Pneumonia (05/01/2013), PVD (peripheral vascular disease) ([place]), and Urine incontinence.  Hypertension BP Readings from  Last 3 Encounters:  12/25/22 118/64  10/30/22 130/68  09/01/22 126/64   Stable, pt to continue medical treatment losartan 50 mg qd, procardia xl 90 mg qd   Skin lesion Pt to sit with cushion and try not to sleep in chair at home, also for topical steroid cream prn asd, and refer dermatology in case not improving  Low back pain Pain c/w referred pain vs sciatica - for trial gabapentin 100 tid,  to f/u any worsening symptoms or concerns    Vision loss of left eye Chronic peristent, but due for optho f/u  - will refer  Followup: Return if symptoms worsen or fail to improve.  Cathlean Cower, MD 12/25/2022 1:08 PM [facility]

## 2022-12-25 NOTE — Assessment & Plan Note (Signed)
BP Readings from Last 3 Encounters:  12/25/22 118/64  10/30/22 130/68  09/01/22 126/64   Stable, pt to continue medical treatment losartan 50 mg qd, procardia xl 90 mg qd

## 2022-12-25 NOTE — Assessment & Plan Note (Signed)
Chronic peristent, but due for optho f/u  - will refer

## 2022-12-25 NOTE — Patient Instructions (Signed)
Please take all new medication as prescribed - the stronger steroid cream, and gabapentin for possible nerve pain  Please continue all other medications as before, and refills have been done if requested.  Please have the pharmacy call with any other refills you may need.  Please continue your efforts at being more active, low cholesterol diet, and weight control.  Please keep your appointments with your specialists as you may have planned  You will be contacted regarding the referral for: Dermatology, and eye doctor

## 2022-12-25 NOTE — Assessment & Plan Note (Signed)
Pt to sit with cushion and try not to sleep in chair at home, also for topical steroid cream prn asd, and refer dermatology in case not improving

## 2023-01-05 ENCOUNTER — Telehealth: Payer: Self-pay

## 2023-01-05 NOTE — Telephone Encounter (Signed)
ILR alert for tachy episode (oversensing as seen in previous reports, total 176)  RRT, triggered 01/04/23. Routing for further review. - JJB  1 False tachy report since last review.   176 total over life of device.  We are aware.  Need to contact patient regarding RRT.

## 2023-01-06 NOTE — Telephone Encounter (Signed)
ILR @ RRT   ILR reached RRT:  01/04/2023 Patient called, discussed options to leave device in or explanted.  Patient would like to think some more about her options.  She will call this nurse back when she has decided what option should would like to take.  Marked "I" in Paceart: done Enter note in Paceart:  done Canceled future remotes: done Discontinued from website: done Entered in Speciality Comments: done  Advised if further questions arise to please call the device clinic at [phone].

## 2023-01-08 NOTE — Progress Notes (Signed)
[facility] Summary Report / Loop Recorder

## 2023-01-15 NOTE — Progress Notes (Deleted)
01/15/2023 Elaine Turner [id number] [birth year]  Referring provider: Biagio Borg, MD Primary GI doctor: Dr. Henrene Pastor  ASSESSMENT AND PLAN:   Esophageal dysphagia to liquids Had EGD 2021, is worse with liquids, no aspiration Will proceed with barium swallow to evaluate, likely component of age/GERD  Irritable bowel syndrome with constipation Normal colon, long standing history Some incontinence with miralax only, had good result with linzess years ago but was too expensive, will retry 72 and 119mg If too expensive can do miralax with fiber, likely component of pelvis floor dysfunction  B12 deficiency Start on B12 sublingual, will recheck next OV If not better consider shots Possibly contributing to some of her symptoms  Rash At gluteal cleft, no abscess, no warmth, no signs of yeast Will treat with hydrocortisone and follow up with PCP, may need trial of nystatin versus biopsy  2-3 months OV   History of Present Illness:  77y.o. female  with a past medical history of COPD, peripheral vascular disease on Plavix, AIN 3, internal hemorrhoids,  diverticulosis, GERD with esophageal stricture and others listed below, returns to clinic today for evaluation of constipation, gas, bloating, mass/sore spot on buttocks..  10/2020 endoscopy and colonoscopy for microcytic anemia and abdominal pain Upper endoscopy revealed mild esophagitis and a small hernia.   Colonoscopy revealed pandiverticulosis and internal hemorrhoids  10/2020 CT scan diverticulosis otherwise unremarkable 07/15/2022 office visit with Dr. PHenrene Pastorfor functional constipation suggested high-fiber diet, MiraLAX and follow-up as needed. 08/2022 Labs reviewed showed normal iron, ferritin, very low B12, normal thyroid and sed rate. 10/30/2022 office visit with myself in regards to dysphagia with liquids, IBS with constipation, B12 deficiency and rash. Patient was started on B12, did trial of Linzess 72 and 145 send in  prescription but if too expensive was going to MiraLAX and fiber Scheduled for barium swallow however this was never done.    She  reports that she has been smoking cigarettes. She has a 62.00 pack-year smoking history. She has never used smokeless tobacco. She reports current alcohol use. She reports that she does not use drugs. Her family history includes Hypertension in her mother; Lung cancer in her father.   Current Medications:    Current Outpatient Medications (Cardiovascular):    atorvastatin (LIPITOR) 40 MG tablet, TAKE 1 TABLET BY MOUTH DAILY AT 6 PM.   furosemide (LASIX) 20 MG tablet, Take 1 tablet (20 mg total) by mouth daily.   losartan (COZAAR) 50 MG tablet, Take 1 tablet (50 mg total) by mouth daily.   NIFEdipine (PROCARDIA XL/NIFEDICAL-XL) 90 MG 24 hr tablet, TAKE 1 TABLET BY MOUTH EVERY DAY  Current Outpatient Medications (Respiratory):    albuterol (VENTOLIN HFA) 108 (90 Base) MCG/ACT inhaler, TAKE 2 PUFFS BY MOUTH EVERY 6 HOURS AS NEEDED FOR WHEEZE OR SHORTNESS OF BREATH (Patient not taking: Reported on 10/30/2022)  Current Outpatient Medications (Analgesics):    aspirin 81 MG EC tablet, Take 1 tablet (81 mg total) by mouth daily. Swallow whole. (Patient not taking: Reported on 10/30/2022)   traMADol (ULTRAM) 50 MG tablet, Take 1 tablet (50 mg total) by mouth 3 (three) times daily as needed.  Current Outpatient Medications (Hematological):    clopidogrel (PLAVIX) 75 MG tablet, Take 1 tablet (75 mg total) by mouth daily.  Current Outpatient Medications (Other):    bisacodyl (DULCOLAX) 5 MG EC tablet, Take 5 mg by mouth daily as needed for moderate constipation. Patient states she takes 6 a day when she is constipated (Patient not  taking: Reported on 10/30/2022)   clobetasol cream (TEMOVATE) AB-123456789 %, Apply 1 Application topically 2 (two) times daily.   gabapentin (NEURONTIN) 100 MG capsule, Take 1 capsule (100 mg total) by mouth 3 (three) times daily.   hydrocortisone  (ANUSOL-HC) 2.5 % rectal cream, Place 1 Application rectally 2 (two) times daily.   LORazepam (ATIVAN) 0.5 MG tablet, 1-2 tabs 30 - 60 min prior to MRI. Do not drive with this medicine.   omeprazole (PRILOSEC) 20 MG capsule, Take 1 capsule (20 mg total) by mouth daily. (Patient not taking: Reported on 10/30/2022)   PARoxetine (PAXIL) 10 MG tablet, Take 1 tablet (10 mg total) by mouth daily. (Patient not taking: Reported on 10/30/2022)   tiZANidine (ZANAFLEX) 2 MG tablet, Take 1 tablet (2 mg total) by mouth every 6 (six) hours as needed for muscle spasms.   topiramate (TOPAMAX) 25 MG tablet, Take 1 tablet (25 mg total) by mouth 2 (two) times daily. (Patient not taking: Reported on 10/30/2022)   traZODone (DESYREL) 50 MG tablet, Take 0.5-1 tablets (25-50 mg total) by mouth at bedtime as needed for sleep.  Surgical History:  She  has a past surgical history that includes Abdominal hysterectomy; Hemorrhoid surgery (N/A, 06/26/2015); LOOP RECORDER INSERTION (N/A, 05/25/2019); and Carpal tunnel release (Right, 09/2019).  Current Medications, Allergies, Past Medical History, Past Surgical History, Family History and Social History were reviewed in [employer] record.  Physical Exam: There were no vitals taken for this visit. General:   Pleasant, well developed female in no acute distress Heart : Regular rate and rhythm; no murmurs Pulm: Clear anteriorly; no wheezing Abdomen:  Soft, Obese AB, Sluggish bowel sounds. mild tenderness in the lower abdomen. Without guarding and Without rebound, No organomegaly appreciated. Rectal: area of concern for the patient is small scaly slightly raised 5-6 mm area on gluteal cleft, tender, no induration, no redness, warmth, swelling. Normal external rectal exam.  Extremities:  without  edema. Neurologic:  Alert and  oriented x4;  No focal deficits.  Psych:  Cooperative. Normal mood and affect.   Vladimir Crofts, PA-C 01/15/23

## 2023-01-21 ENCOUNTER — Ambulatory Visit: Payer: Medicare Other | Admitting: Physician Assistant

## 2023-01-21 DIAGNOSIS — M5412 Radiculopathy, cervical region: Secondary | ICD-10-CM | POA: Diagnosis not present

## 2023-02-08 ENCOUNTER — Other Ambulatory Visit: Payer: Self-pay | Admitting: Internal Medicine

## 2023-02-19 ENCOUNTER — Other Ambulatory Visit: Payer: Self-pay | Admitting: Internal Medicine

## 2023-03-01 NOTE — Progress Notes (Signed)
03/02/2023 EMERA SPATH [id number] 1946/01/08  Referring provider: Biagio Borg, MD Primary GI doctor: Dr. Henrene Pastor  ASSESSMENT AND PLAN:   Esophageal dysphagia to liquids Had EGD 2021, is worse with liquids, no aspiration Will proceed with barium swallow to evaluate, likely component of age/GERD  Irritable bowel syndrome with constipation, helping AB discomfort Normal colon 10/2020, long standing history Miralax and laxatives over the counter do not work, she has had good results with the linzess green label but it was too expensive, will give samples of linzess and prescription. If this is too expensive can try amitiza.  Likely component of pelvis floor dysfunction  B12 deficiency Has been on B12 sublingual, getting checked with PCP today Possibly contributing to some of her symptoms  2-3 months OV   History of Present Illness:  77 y.o. female  with a past medical history of COPD, peripheral vascular disease on Plavix, AIN 3, internal hemorrhoids,  diverticulosis, GERD with esophageal stricture and others listed below, returns to clinic today for evaluation of constipation, gas, bloating, mass/sore spot on buttocks..  10/2020 endoscopy and colonoscopy for microcytic anemia and abdominal pain Upper endoscopy revealed mild esophagitis and a small hernia.   Colonoscopy revealed pandiverticulosis and internal hemorrhoids  10/2020 CT scan diverticulosis otherwise unremarkable 07/15/2022 office visit with Dr. Henrene Pastor for functional constipation suggested high-fiber diet, MiraLAX and follow-up as needed. 08/2022 Labs reviewed showed normal iron, ferritin, very low B12, normal thyroid and sed rate. Pending barium swallow  No GERD, no nausea, vomiting, no palpitations, no chest discomfort, no SOB.  She does get full quickly for the last year, decreased taste of food.  Has lost about 3 lbs in last year.  States she can drink water and get choked, no issues with pills or food.  Denies  coughing while eating or drinking.  Had last BM 3-4 days ago, some AB pain that is better after a BM.  Her Bm's were much better on the linzess 145.    She  reports that she has been smoking cigarettes. She has a 62.00 pack-year smoking history. She has never used smokeless tobacco. She reports current alcohol use. She reports that she does not use drugs. Her family history includes Hypertension in her mother; Lung cancer in her father.   Current Medications:    Current Outpatient Medications (Cardiovascular):    atorvastatin (LIPITOR) 40 MG tablet, TAKE 1 TABLET BY MOUTH DAILY AT 6 PM.   furosemide (LASIX) 20 MG tablet, Take 1 tablet (20 mg total) by mouth daily.   losartan (COZAAR) 50 MG tablet, Take 1 tablet (50 mg total) by mouth daily.   NIFEdipine (PROCARDIA XL/NIFEDICAL-XL) 90 MG 24 hr tablet, TAKE 1 TABLET BY MOUTH EVERY DAY   Current Outpatient Medications (Analgesics):    traMADol (ULTRAM) 50 MG tablet, Take 1 tablet (50 mg total) by mouth 3 (three) times daily as needed.  Current Outpatient Medications (Hematological):    clopidogrel (PLAVIX) 75 MG tablet, TAKE 1 TABLET BY MOUTH EVERY DAY  Current Outpatient Medications (Other):    bisacodyl (DULCOLAX) 5 MG EC tablet, Take 5 mg by mouth daily as needed for moderate constipation. Patient states she takes 6 a day when she is constipated   clobetasol cream (TEMOVATE) AB-123456789 %, Apply 1 Application topically 2 (two) times daily.   gabapentin (NEURONTIN) 100 MG capsule, Take 1 capsule (100 mg total) by mouth 3 (three) times daily.   hydrocortisone (ANUSOL-HC) 2.5 % rectal cream, Place 1 Application rectally 2 (  two) times daily.   linaclotide (LINZESS) 145 MCG CAPS capsule, Take 1 capsule (145 mcg total) by mouth daily before breakfast.   LORazepam (ATIVAN) 0.5 MG tablet, 1-2 tabs 30 - 60 min prior to MRI. Do not drive with this medicine.   omeprazole (PRILOSEC) 20 MG capsule, Take 1 capsule (20 mg total) by mouth daily.    PARoxetine (PAXIL) 10 MG tablet, Take 1 tablet (10 mg total) by mouth daily.   tiZANidine (ZANAFLEX) 2 MG tablet, Take 1 tablet (2 mg total) by mouth every 6 (six) hours as needed for muscle spasms.   topiramate (TOPAMAX) 25 MG tablet, Take 1 tablet (25 mg total) by mouth 2 (two) times daily.   traZODone (DESYREL) 50 MG tablet, TAKE 0.5-1 TABLETS BY MOUTH AT BEDTIME AS NEEDED FOR SLEEP.  Surgical History:  She  has a past surgical history that includes Abdominal hysterectomy; Hemorrhoid surgery (N/A, 06/26/2015); LOOP RECORDER INSERTION (N/A, 05/25/2019); and Carpal tunnel release (Right, 09/2019).  Current Medications, Allergies, Past Medical History, Past Surgical History, Family History and Social History were reviewed in [employer] record.  Physical Exam: BP 130/60   Pulse 68   Ht '5\' 2"'$  (1.575 m)   Wt 110 lb (49.9 kg)   BMI 20.12 kg/m  General:   Pleasant, well developed female in no acute distress Heart : Regular rate and rhythm; no murmurs Pulm: Clear anteriorly; no wheezing Abdomen:  Soft, Obese AB, Sluggish bowel sounds. mild tenderness in the lower abdomen. Without guarding and Without rebound, No organomegaly appreciated. Rectal: area of concern for the patient is small scaly slightly raised 5-6 mm area on gluteal cleft, tender, no induration, no redness, warmth, swelling. Normal external rectal exam.  Extremities:  without  edema. Neurologic:  Alert and  oriented x4;  No focal deficits.  Psych:  Cooperative. Normal mood and affect.   Vladimir Crofts, PA-C 03/02/23

## 2023-03-02 ENCOUNTER — Encounter: Payer: Self-pay | Admitting: Internal Medicine

## 2023-03-02 ENCOUNTER — Ambulatory Visit (INDEPENDENT_AMBULATORY_CARE_PROVIDER_SITE_OTHER): Payer: Medicare Other | Admitting: Internal Medicine

## 2023-03-02 ENCOUNTER — Ambulatory Visit: Payer: Medicare Other | Admitting: Physician Assistant

## 2023-03-02 ENCOUNTER — Other Ambulatory Visit (INDEPENDENT_AMBULATORY_CARE_PROVIDER_SITE_OTHER): Payer: Medicare Other

## 2023-03-02 ENCOUNTER — Encounter: Payer: Self-pay | Admitting: Physician Assistant

## 2023-03-02 VITALS — BP 130/60 | HR 68 | Ht 62.0 in | Wt 110.0 lb

## 2023-03-02 VITALS — BP 192/96 | HR 76 | Temp 97.9°F | Ht 62.0 in | Wt 110.0 lb

## 2023-03-02 DIAGNOSIS — E785 Hyperlipidemia, unspecified: Secondary | ICD-10-CM

## 2023-03-02 DIAGNOSIS — E538 Deficiency of other specified B group vitamins: Secondary | ICD-10-CM

## 2023-03-02 DIAGNOSIS — F17200 Nicotine dependence, unspecified, uncomplicated: Secondary | ICD-10-CM

## 2023-03-02 DIAGNOSIS — R739 Hyperglycemia, unspecified: Secondary | ICD-10-CM

## 2023-03-02 DIAGNOSIS — R1319 Other dysphagia: Secondary | ICD-10-CM

## 2023-03-02 DIAGNOSIS — I700 Atherosclerosis of aorta: Secondary | ICD-10-CM | POA: Diagnosis not present

## 2023-03-02 DIAGNOSIS — E559 Vitamin D deficiency, unspecified: Secondary | ICD-10-CM

## 2023-03-02 DIAGNOSIS — I10 Essential (primary) hypertension: Secondary | ICD-10-CM

## 2023-03-02 DIAGNOSIS — K581 Irritable bowel syndrome with constipation: Secondary | ICD-10-CM | POA: Diagnosis not present

## 2023-03-02 DIAGNOSIS — N184 Chronic kidney disease, stage 4 (severe): Secondary | ICD-10-CM

## 2023-03-02 DIAGNOSIS — Z0001 Encounter for general adult medical examination with abnormal findings: Secondary | ICD-10-CM

## 2023-03-02 DIAGNOSIS — K2100 Gastro-esophageal reflux disease with esophagitis, without bleeding: Secondary | ICD-10-CM

## 2023-03-02 LAB — HEPATIC FUNCTION PANEL
ALT: 12 U/L (ref 0–35)
AST: 20 U/L (ref 0–37)
Albumin: 4.1 g/dL (ref 3.5–5.2)
Alkaline Phosphatase: 70 U/L (ref 39–117)
Bilirubin, Direct: 0.1 mg/dL (ref 0.0–0.3)
Total Bilirubin: 0.4 mg/dL (ref 0.2–1.2)
Total Protein: 7.1 g/dL (ref 6.0–8.3)

## 2023-03-02 LAB — CBC WITH DIFFERENTIAL/PLATELET
Basophils Absolute: 0.1 10*3/uL (ref 0.0–0.1)
Basophils Relative: 0.8 % (ref 0.0–3.0)
Eosinophils Absolute: 0.2 10*3/uL (ref 0.0–0.7)
Eosinophils Relative: 1.4 % (ref 0.0–5.0)
HCT: 30 % — ABNORMAL LOW (ref 36.0–46.0)
Hemoglobin: 9.6 g/dL — ABNORMAL LOW (ref 12.0–15.0)
Lymphocytes Relative: 13.5 % (ref 12.0–46.0)
Lymphs Abs: 1.6 10*3/uL (ref 0.7–4.0)
MCHC: 32 g/dL (ref 30.0–36.0)
MCV: 72 fl — ABNORMAL LOW (ref 78.0–100.0)
Monocytes Absolute: 0.6 10*3/uL (ref 0.1–1.0)
Monocytes Relative: 5.6 % (ref 3.0–12.0)
Neutro Abs: 9.1 10*3/uL — ABNORMAL HIGH (ref 1.4–7.7)
Neutrophils Relative %: 78.7 % — ABNORMAL HIGH (ref 43.0–77.0)
Platelets: 549 10*3/uL — ABNORMAL HIGH (ref 150.0–400.0)
RBC: 4.17 Mil/uL (ref 3.87–5.11)
RDW: 19.2 % — ABNORMAL HIGH (ref 11.5–15.5)
WBC: 11.6 10*3/uL — ABNORMAL HIGH (ref 4.0–10.5)

## 2023-03-02 LAB — URINALYSIS, ROUTINE W REFLEX MICROSCOPIC
Bilirubin Urine: NEGATIVE
Ketones, ur: NEGATIVE
Leukocytes,Ua: NEGATIVE
Nitrite: NEGATIVE
Specific Gravity, Urine: 1.025 (ref 1.000–1.030)
Total Protein, Urine: 100 — AB
Urine Glucose: NEGATIVE
Urobilinogen, UA: 0.2 (ref 0.0–1.0)
pH: 6 (ref 5.0–8.0)

## 2023-03-02 LAB — BASIC METABOLIC PANEL
BUN: 26 mg/dL — ABNORMAL HIGH (ref 6–23)
CO2: 25 mEq/L (ref 19–32)
Calcium: 9.4 mg/dL (ref 8.4–10.5)
Chloride: 105 mEq/L (ref 96–112)
Creatinine, Ser: 1.5 mg/dL — ABNORMAL HIGH (ref 0.40–1.20)
GFR: 33.05 mL/min — ABNORMAL LOW (ref >60.00)
Glucose, Bld: 86 mg/dL (ref 70–99)
Potassium: 4.3 mEq/L (ref 3.5–5.1)
Sodium: 141 mEq/L (ref 135–145)

## 2023-03-02 LAB — VITAMIN D 25 HYDROXY (VIT D DEFICIENCY, FRACTURES): VITD: 17.49 ng/mL — ABNORMAL LOW (ref 30.00–100.00)

## 2023-03-02 LAB — LIPID PANEL
Cholesterol: 149 mg/dL (ref 0–200)
HDL: 78.6 mg/dL (ref >39.00)
LDL Cholesterol: 53 mg/dL (ref 0–99)
NonHDL: 70.45
Total CHOL/HDL Ratio: 2
Triglycerides: 87 mg/dL (ref 0.0–149.0)
VLDL: 17.4 mg/dL (ref 0.0–40.0)

## 2023-03-02 LAB — VITAMIN B12: Vitamin B-12: 199 pg/mL — ABNORMAL LOW (ref 211–911)

## 2023-03-02 LAB — TSH: TSH: 0.37 u[IU]/mL (ref 0.35–5.50)

## 2023-03-02 LAB — HEMOGLOBIN A1C: Hgb A1c MFr Bld: 5.9 % (ref 4.6–6.5)

## 2023-03-02 MED ORDER — LINACLOTIDE 145 MCG PO CAPS: 145.0000 ug | ORAL_CAPSULE | Freq: Every day | ORAL | 3 refills | Status: DC

## 2023-03-02 NOTE — Progress Notes (Signed)
Noted  

## 2023-03-02 NOTE — Assessment & Plan Note (Signed)
Lab Results  Component Value Date   HGBA1C 5.7 01/19/2022   Stable, pt to continue current medical treatment  - diet, wt control, excercise

## 2023-03-02 NOTE — Progress Notes (Signed)
Patient ID: Elaine Turner, female   DOB: [birth year], 77 y.o.   MRN: [id number]         Chief Complaint:: wellness exam and htn, smoking, aortic atherosclerosis, , low B12 CKD4, hyperglycemia, hld, htn       HPI:  Elaine Turner is a 77 y.o. female here for wellness exam; declines tdap, flu shot or pulm referral for LDCT screening, but for shingrix at the pharmacy, o/w up to date                        Also BP has been ok at home but not checked recently,  Sees renal on regular basis.  Has GI appt later this am.  Still smoking, not ready to quit.  Pt denies chest pain, increased sob or doe, wheezing, orthopnea, PND, increased LE swelling, palpitations, dizziness or syncope.   Pt denies polydipsia, polyuria, or new focal neuro s/s.    Pt denies fever, wt loss, night sweats, loss of appetite, or other constitutional symptoms  Denies worsening depressive symptoms, suicidal ideation, or panic   Wt Readings from Last 3 Encounters:  03/02/23 110 lb (49.9 kg)  03/02/23 110 lb (49.9 kg)  12/25/22 113 lb (51.3 kg)   BP Readings from Last 3 Encounters:  03/02/23 130/60  03/02/23 (!) 192/96  12/25/22 118/64   Immunization History  Administered Date(s) Administered   Fluad Quad(high Dose 65+) 09/27/2019, 02/06/2022   Influenza, High Dose Seasonal PF 01/06/2018, 11/11/2018   Influenza-Unspecified 09/29/2020   Pneumococcal Conjugate-13 04/13/2014, 10/04/2015, 09/27/2019   Pneumococcal Polysaccharide-23 03/25/2016   Tdap 08/28/2009   Health Maintenance Due  Topic Date Due   Medicare Annual Wellness (AWV)  Never done   DTaP/Tdap/Td (2 - Td or Tdap) 08/29/2019      Past Medical History:  Diagnosis Date   AIN III (anal intraepithelial neoplasia III) 06/26/2015   AIN II-III of internal hemorrhoids   Aortic atherosclerosis (HCC)    Chronic diarrhea    COPD  GOLD 0 / active smoker  10/24/2015   Diverticulitis    Diverticulosis    Esophageal stricture    Heart murmur    Hemorrhoids     Hyperglycemia 05/01/2013   Hypertension    Ischemic optic neuropathy, left eye 05/25/2019   Pneumonia 05/01/2013   PVD (peripheral vascular disease) ([place])    Urine incontinence    Past Surgical History:  Procedure Laterality Date   ABDOMINAL HYSTERECTOMY     CARPAL TUNNEL RELEASE Right 09/2019   HEMORRHOID SURGERY N/A 06/26/2015   Procedure: INTERNAL AND EXTERNAL HEMORRHOIDECTOMY;  Surgeon: Georganna Skeans, MD;  Location: [facility];  Service: General;  Laterality: N/A;   LOOP RECORDER INSERTION N/A 05/25/2019   Procedure: LOOP RECORDER INSERTION;  Surgeon: Constance Haw, MD;  Location: [facility];  Service: Cardiovascular;  Laterality: N/A;    reports that she has been smoking cigarettes. She has a 62.00 pack-year smoking history. She has never used smokeless tobacco. She reports current alcohol use. She reports that she does not use drugs. family history includes Hypertension in her mother; Lung cancer in her father. Allergies  Allergen Reactions   Ace Inhibitors Cough   Levaquin [Levofloxacin] Other (See Comments)    GI upset   Current Outpatient Medications on File Prior to Visit  Medication Sig Dispense Refill   atorvastatin (LIPITOR) 40 MG tablet TAKE 1 TABLET BY MOUTH DAILY AT 6 PM. 90 tablet 3   clobetasol  cream (TEMOVATE) [id number] % Apply 1 Application topically 2 (two) times daily. 30 g 1   clopidogrel (PLAVIX) 75 MG tablet TAKE 1 TABLET BY MOUTH EVERY DAY 90 tablet 2   furosemide (LASIX) 20 MG tablet Take 1 tablet (20 mg total) by mouth daily. 30 tablet 11   gabapentin (NEURONTIN) 100 MG capsule Take 1 capsule (100 mg total) by mouth 3 (three) times daily. 90 capsule 3   hydrocortisone (ANUSOL-HC) 2.5 % rectal cream Place 1 Application rectally 2 (two) times daily. 30 g 2   LORazepam (ATIVAN) 0.5 MG tablet 1-2 tabs 30 - 60 min prior to MRI. Do not drive with this medicine. 4 tablet 0   losartan (COZAAR) 50 MG tablet Take 1 tablet (50 mg total) by mouth  daily. 90 tablet 3   NIFEdipine (PROCARDIA XL/NIFEDICAL-XL) 90 MG 24 hr tablet TAKE 1 TABLET BY MOUTH EVERY DAY 90 tablet 3   tiZANidine (ZANAFLEX) 2 MG tablet Take 1 tablet (2 mg total) by mouth every 6 (six) hours as needed for muscle spasms. 30 tablet 2   traMADol (ULTRAM) 50 MG tablet Take 1 tablet (50 mg total) by mouth 3 (three) times daily as needed. 60 tablet 2   traZODone (DESYREL) 50 MG tablet TAKE 0.5-1 TABLETS BY MOUTH AT BEDTIME AS NEEDED FOR SLEEP. 90 tablet 1   bisacodyl (DULCOLAX) 5 MG EC tablet Take 5 mg by mouth daily as needed for moderate constipation. Patient states she takes 6 a day when she is constipated     omeprazole (PRILOSEC) 20 MG capsule Take 1 capsule (20 mg total) by mouth daily. 90 capsule 3   PARoxetine (PAXIL) 10 MG tablet Take 1 tablet (10 mg total) by mouth daily. 90 tablet 3   topiramate (TOPAMAX) 25 MG tablet Take 1 tablet (25 mg total) by mouth 2 (two) times daily. 180 tablet 1   [DISCONTINUED] citalopram (CELEXA) 10 MG tablet Take 1 tablet (10 mg total) by mouth daily. 90 tablet 3   [DISCONTINUED] fluticasone (FLONASE) 50 MCG/ACT nasal spray Place 2 sprays into both nostrils daily. (Patient taking differently: Place 2 sprays into both nostrils as needed for rhinitis. ) 16 g 6   No current facility-administered medications on file prior to visit.        ROS:  All others reviewed and negative.  Objective        PE:  BP (!) 192/96 (BP Location: Left Arm, Patient Position: Sitting, Cuff Size: Normal)   Pulse 76   Temp 97.9 F (36.6 C) (Oral)   Ht '5\' 2"'$  (1.575 m)   Wt 110 lb (49.9 kg)   SpO2 98%   BMI 20.12 kg/m                 Constitutional: Pt appears in NAD               HENT: Head: NCAT.                Right Ear: External ear normal.                 Left Ear: External ear normal.                Eyes: . Pupils are equal, round, and reactive to light. Conjunctivae and EOM are normal               Nose: without d/c or deformity  Neck: Neck supple. Gross normal ROM               Cardiovascular: Normal rate and regular rhythm.                 Pulmonary/Chest: Effort normal and breath sounds without rales or wheezing.                Abd:  Soft, NT, ND, + BS, no organomegaly               Neurological: Pt is alert. At baseline orientation, motor grossly intact               Skin: Skin is warm. No rashes, no other new lesions, LE edema - none               Psychiatric: Pt behavior is normal without agitation   Micro: none  Cardiac tracings I have personally interpreted today:  none  Pertinent Radiological findings (summarize): none   Lab Results  Component Value Date   WBC 12.0 (H) 09/01/2022   HGB 10.4 (L) 09/01/2022   HCT 32.8 (L) 09/01/2022   PLT 405.0 (H) 09/01/2022   GLUCOSE 90 09/01/2022   CHOL 178 09/01/2022   TRIG 139.0 09/01/2022   HDL 99.30 09/01/2022   LDLCALC 51 09/01/2022   ALT 22 09/01/2022   AST 21 09/01/2022   NA 141 09/01/2022   K 3.9 09/01/2022   CL 103 09/01/2022   CREATININE 1.78 (H) 09/01/2022   BUN 34 (H) 09/01/2022   CO2 24 09/01/2022   TSH 0.42 09/01/2022   INR 1.1 05/24/2019   HGBA1C 5.7 01/19/2022   Assessment/Plan:  Elaine Turner is a 77 y.o. Black or African American [2] female with  has a past medical history of AIN III (anal intraepithelial neoplasia III) (06/26/2015), Aortic atherosclerosis ([place]), Chronic diarrhea, COPD  GOLD 0 / active smoker  (10/24/2015), Diverticulitis, Diverticulosis, Esophageal stricture, Heart murmur, Hemorrhoids, Hyperglycemia (05/01/2013), Hypertension, Ischemic optic neuropathy, left eye (05/25/2019), Pneumonia (05/01/2013), PVD (peripheral vascular disease) ([place]), and Urine incontinence.  Encounter for well adult exam with abnormal findings Age and sex appropriate education and counseling updated with regular exercise and diet Referrals for preventative services - declines LDCT screening for now Immunizations addressed - declines tdap, flu shot,  but for shingrx at the pharmacy Smoking counseling  - counsled to quit, pt not ready Evidence for depression or other mood disorder - stable anxiety depression.   Most recent labs reviewed. I have personally reviewed and have noted: 1) the patient's medical and social history 2) The patient's current medications and supplements 3) The patient's height, weight, and BMI have been recorded in the chart   Aortic atherosclerosis (HCC) Pt for exercise, low chol diet, cont lipitor 40 mg qd  B12 deficiency Lab Results  Component Value Date   VITAMINB12 166 (L) 09/01/2022   Low, reminded to start oral replacement - b12 1000 mcg qd   CKD (chronic kidney disease) stage 4, GFR 15-29 ml/min (HCC) Lab Results  Component Value Date   CREATININE 1.78 (H) 09/01/2022   Stable overall, cont to avoid nephrotoxins, for f/u lab, and renal f/u as planned  Hyperglycemia Lab Results  Component Value Date   HGBA1C 5.7 01/19/2022   Stable, pt to continue current medical treatment  - diet, wt control, excercise   Hyperlipidemia Lab Results  Component Value Date   LDLCALC 51 09/01/2022   Stable, pt to continue current statin lipitor 40 mg qd  Hypertension BP Readings from Last 3 Encounters:  03/02/23 130/60  03/02/23 (!) 192/96  12/25/22 118/64   Uncontrolled to start but f/u better controlled, pt to continue medical treatment losartan 50 mg qd, proc xl 90 mg qd   Tobacco dependence Pt counsled to quit smoking, pt not ready  Vitamin D deficiency Last vitamin D Lab Results  Component Value Date   VD25OH 18.02 (L) 09/01/2022   Low, to start oral replacement  Followup: Return in about 6 months (around 09/02/2023).  Cathlean Cower, MD 03/02/2023 12:19 PM [facility]

## 2023-03-02 NOTE — Patient Instructions (Addendum)
Your provider has requested that you go to the basement level for lab work before leaving today. Press "B" on the elevator. The lab is located at the first door on the left as you exit the elevator.   Linz[employer]  If this is too expensive than please call us and we can try amitiza  You have been scheduled for a Barium Esophogram at [facility] (1st floor of the hospital) on 03/04/2023 at 10 am. Please arrive 30 minutes prior to your appointment for registration. Make certain not to have anything to eat or drink 3 hours prior to your test. If you need to reschedule for any reason, please contact radiology at [phone] to do so. __________________________________________________________________ A barium swallow is an examination that concentrates on views of the esophagus. This tends to be a double contrast exam (barium and two liquids which, when combined, create a gas to distend the wall of the oesophagus) or single contrast (non-ionic iodine based). The study is usually tailored to your symptoms so a good history is essential. Attention is paid during the study to the form, structure and configuration of the esophagus, looking for functional disorders (such as aspiration, dysphagia, achalasia, motility and reflux) EXAMINATION You may be asked to change into a gown, depending on the type of swallow being performed. A radiologist and radiographer will perform the procedure. The radiologist will advise you of the type of contrast selected for your procedure and direct you during the exam. You will be asked to stand, sit or lie in several different positions and to hold a small amount of fluid in your mouth before being asked to swallow while the imaging is performed .In some instances you may be asked to swallow barium coated marshmallows to assess the motility of a solid food bolus. The exam can be recorded as a digital or video fluoroscopy procedure. POST PROCEDURE It will take 1-2 days for the  barium to pass through your system. To facilitate this, it is important, unless otherwise directed, to increase your fluids for the next 24-48hrs and to resume your normal diet.  This test typically takes about 30 minutes to perform. __________________________________________________________________________________   Take at least 30 minutes before the first meal of the day on an empty stomach You can have a loose stool if you eat a high-fat breakfast. Give it at least 7 days, may have more bowel movements during that time.   The diarrhea should go away and you should start having normal, complete, full bowel movements.  It may be helpful to start treatment when you can be near the comfort of your own bathroom, such as a weekend.  After you are out we can send in a prescription if you did well, there is a prescription card  Gastroparesis Please do small frequent meals like 4-6 meals a day.  Eat and drink liquids at separate times.  Avoid high fiber foods, cook your vegetables, avoid high fat food.  Suggest spreading protein throughout the day (greek yogurt, glucerna, soft meat, milk, eggs) Choose soft foods that you can mash with a fork When you are more symptomatic, change to pureed foods foods and liquids.  Consider reading "Living well with Gastroparesis" by Lambert Keto Gastroparesis is a condition in which food takes longer than normal to empty from the stomach. This condition is also known as delayed gastric emptying. It is usually a long-term (chronic) condition. There is no cure, but there are treatments and things that you can do at home  to help relieve symptoms. Treating the underlying condition that causes gastroparesis can also help relieve symptoms What are the causes? In many cases, the cause of this condition is not known. Possible causes include: A hormone (endocrine) disorder, such as hypothyroidism or diabetes. A nervous system disease, such as Parkinson's disease or  multiple sclerosis. Cancer, infection, or surgery that affects the stomach or vagus nerve. The vagus nerve runs from your chest, through your neck, and to the lower part of your brain. A connective tissue disorder, such as scleroderma. Certain medicines. What increases the risk? You are more likely to develop this condition if: You have certain disorders or diseases. These may include: An endocrine disorder. An eating disorder. Amyloidosis. Scleroderma. Parkinson's disease. Multiple sclerosis. Cancer or infection of the stomach or the vagus nerve. You have had surgery on your stomach or vagus nerve. You take certain medicines. You are female. What are the signs or symptoms? Symptoms of this condition include: Feeling full after eating very little or a loss of appetite. Nausea, vomiting, or heartburn. Bloating of your abdomen. Inconsistent blood sugar (glucose) levels on blood tests. Unexplained weight loss. Acid from the stomach coming up into the esophagus (gastroesophageal reflux). Sudden tightening (spasm) of the stomach, which can be painful. Symptoms may come and go. Some people may not notice any symptoms. How is this diagnosed? This condition is diagnosed with tests, such as: Tests that check how long it takes food to move through the stomach and intestines. These tests include: Upper gastrointestinal (GI) series. For this test, you drink a liquid that shows up well on X-rays, and then X-rays are taken of your intestines. Gastric emptying scintigraphy. For this test, you eat food that contains a small amount of radioactive material, and then scans are taken. Wireless capsule GI monitoring system. For this test, you swallow a pill (capsule) that records information about how foods and fluid move through your stomach. Gastric manometry. For this test, a tube is passed down your throat and into your stomach to measure electrical and muscular activity. Endoscopy. For this test, a  long, thin tube with a camera and light on the end is passed down your throat and into your stomach to check for problems in your stomach lining. Ultrasound. This test uses sound waves to create images of the inside of your body. This can help rule out gallbladder disease or pancreatitis as a cause of your symptoms. How is this treated? There is no cure for this condition, but treatment and home care may relieve symptoms. Treatment may include: Treating the underlying cause. Managing your symptoms by making changes to your diet and exercise habits. Taking medicines to control nausea and vomiting and to stimulate stomach muscles. Getting food through a feeding tube in the hospital. This may be done in severe cases. Having surgery to insert a device called a gastric electrical stimulator into your body. This device helps improve stomach emptying and control nausea and vomiting. Follow these instructions at home: Take over-the-counter and prescription medicines only as told by your health care provider. Follow instructions from your health care provider about eating or drinking restrictions. Your health care provider may recommend that you: Eat smaller meals more often. Eat low-fat foods. Eat low-fiber forms of high-fiber foods. For example, eat cooked vegetables instead of raw vegetables. Have only liquid foods instead of solid foods. Liquid foods are easier to digest. Drink enough fluid to keep your urine pale yellow. Exercise as often as told by your health care  provider. Keep all follow-up visits. This is important. Contact a health care provider if you: Notice that your symptoms do not improve with treatment. Have new symptoms. Get help right away if you: Have severe pain in your abdomen that does not improve with treatment. Have nausea that is severe or does not go away. Vomit every time you drink fluids. Summary Gastroparesis is a long-term (chronic) condition in which food takes  longer than normal to empty from the stomach. Symptoms include nausea, vomiting, heartburn, bloating of your abdomen, and loss of appetite. Eating smaller portions, low-fat foods, and low-fiber forms of high-fiber foods may help you manage your symptoms. Get help right away if you have severe pain in your abdomen. This information is not intended to replace advice given to you by your health care provider. Make sure you discuss any questions you have with your health care provider. Document Revised: 04/22/2020 Document Reviewed: 04/22/2020 Elsevier Patient Education  2021 [employer].

## 2023-03-02 NOTE — Assessment & Plan Note (Addendum)
Age and sex appropriate education and counseling updated with regular exercise and diet Referrals for preventative services - declines LDCT screening for now Immunizations addressed - declines tdap, flu shot, but for shingrx at the pharmacy Smoking counseling  - counsled to quit, pt not ready Evidence for depression or other mood disorder - stable anxiety depression.   Most recent labs reviewed. I have personally reviewed and have noted: 1) the patient's medical and social history 2) The patient's current medications and supplements 3) The patient's height, weight, and BMI have been recorded in the chart

## 2023-03-02 NOTE — Assessment & Plan Note (Signed)
BP Readings from Last 3 Encounters:  03/02/23 130/60  03/02/23 (!) 192/96  12/25/22 118/64   Uncontrolled to start but f/u better controlled, pt to continue medical treatment losartan 50 mg qd, proc xl 90 mg qd

## 2023-03-02 NOTE — Assessment & Plan Note (Signed)
Last vitamin D Lab Results  Component Value Date   VD25OH 18.02 (L) 09/01/2022   Low, to start oral replacement

## 2023-03-02 NOTE — Assessment & Plan Note (Signed)
Lab Results  Component Value Date   CREATININE 1.78 (H) 09/01/2022   Stable overall, cont to avoid nephrotoxins, for f/u lab, and renal f/u as planned

## 2023-03-02 NOTE — Assessment & Plan Note (Signed)
Pt for exercise, low chol diet, cont lipitor 40 mg qd

## 2023-03-02 NOTE — Patient Instructions (Addendum)
Please have your Shingrix (shingles) shots done at your local pharmacy.  Please continue all other medications as before, and refills have been done if requested.  Please have the pharmacy call with any other refills you may need.  Please continue your efforts at being more active, low cholesterol diet, and weight control.  You are otherwise up to date with prevention measures today.  Please keep your appointments with your specialists as you may have planned  Please go to the LAB at the blood drawing area for the tests to be done - at the [facility] lab after your GI appt today  You will be contacted by phone if any changes need to be made immediately.  Otherwise, you will receive a letter about your results with an explanation, but please check with MyChart first.  Please remember to sign up for MyChart if you have not done so, as this will be important to you in the future with finding out test results, communicating by private email, and scheduling acute appointments online when needed.  Please make an Appointment to return in 6 months, or sooner if needed

## 2023-03-02 NOTE — Assessment & Plan Note (Signed)
Lab Results  Component Value Date   LDLCALC 51 09/01/2022   Stable, pt to continue current statin lipitor 40 mg qd

## 2023-03-02 NOTE — Assessment & Plan Note (Signed)
Lab Results  Component Value Date   VITAMINB12 166 (L) 09/01/2022   Low, reminded to start oral replacement - b12 1000 mcg qd

## 2023-03-02 NOTE — Assessment & Plan Note (Signed)
Pt counsled to quit smoking, pt not ready

## 2023-03-04 ENCOUNTER — Other Ambulatory Visit (HOSPITAL_COMMUNITY): Payer: Medicare Other

## 2023-03-17 ENCOUNTER — Telehealth: Payer: Self-pay | Admitting: Physician Assistant

## 2023-03-17 NOTE — Telephone Encounter (Signed)
Spoke with pt and let her know there is nothing to prescribe for gas. Suggested she try extra strength gas-x and try eating foods that do not produce a lot of gas. Pt verbalized understanding.

## 2023-03-17 NOTE — Telephone Encounter (Signed)
Patient is calling wanting to know if there is something to prescribe to her for gas she is having. Please advise

## 2023-03-22 ENCOUNTER — Inpatient Hospital Stay (HOSPITAL_COMMUNITY)
Admission: RE | Admit: 2023-03-22 | Discharge: 2023-03-22 | Disposition: A | Discharge status: Home / Self Care | Payer: Medicare Other | Source: Ambulatory Visit | Attending: Physician Assistant | Admitting: Physician Assistant

## 2023-03-25 ENCOUNTER — Telehealth: Payer: Self-pay

## 2023-03-25 NOTE — Telephone Encounter (Signed)
Called patient to schedule Medicare Annual Wellness Visit (AWV). No voicemail available to leave a message.  Last date of AWV: eligible 01/28/10 for AWV-I  Please schedule an appointment at any time with [facility].   Norton Blizzard, CMA (AAMA)  [facility]

## 2023-04-07 ENCOUNTER — Other Ambulatory Visit: Payer: Self-pay | Admitting: Internal Medicine

## 2023-05-14 ENCOUNTER — Ambulatory Visit: Payer: Medicare Other | Admitting: Nurse Practitioner

## 2023-05-14 ENCOUNTER — Encounter: Payer: Self-pay | Admitting: Nurse Practitioner

## 2023-05-14 VITALS — BP 130/62 | HR 80 | Ht 62.0 in | Wt 109.0 lb

## 2023-05-14 DIAGNOSIS — K5909 Other constipation: Secondary | ICD-10-CM | POA: Diagnosis not present

## 2023-05-14 MED ORDER — LINACLOTIDE 72 MCG PO CAPS: 72.0000 ug | ORAL_CAPSULE | Freq: Every day | ORAL | 3 refills | Status: DC

## 2023-05-14 NOTE — Progress Notes (Signed)
Assessment and Plan   Primary GI: Yancey Flemings, MD   Brief Narrative:  77 y.o. yo female whose past medical history includes but is not necessarily limited to chronic anemia, GERD, chronic constipation, diverticulitis, optic stroke for which she is on plavix,   Chronic constipation.with hard stools and straining -Trial of a lower dose of Linzess (72 mcg daily on empty stomach). Advised her to try and stick with the Linzess because after a few weeks the diarrhea often subsides -If insurance doesn't cover Linzess then will try Amitiza 8 mcg twice daily.  Increase water intake at least 48 oz daily -Will hold off on fiber supplement for now since she has problems with bloating. Bloating generally improves for her when constipation is controlled.   Chronic anemia.  Unrevealing EGD / colonoscopy for iron deficiency anemia in 2021. No overt GI bleeding. Hgb stable at 9.6  GERD/ history of esophagitis.  She isn't taking any acid blockers ( prefers not to). Asymptomatic for now.     History of Present Illness   Chief complaint: constipation, bloating   Elaine Turner has chronic constipation. Over the years she has tried multiple agents  such as miralax, Chronulac, Senokot and Dulcolax. She drinks only a couple of sips of water a day. Stools are hard.   She was seen here in March by Quentin Mulling, PA for evaluation of dysphagia, constipation, abdominal pain, bloating.  She was given samples of Linzess which caused diarrhea. However, she liked the purge because if helped with the bloating.    Ruben complains of early satiety over the last year. She has lost a significant amount of weight but it has been over a several year period. Over the last year she has lost about 6 pounds. Weight has been stable for the last two months.  No N/V. TSH low normal range in March 2024. She was having problems swallowing, mainly liquids when Marchelle Folks saw her in March. Symptoms resolved spontaneously.  Didn't get the  barium swallow Marchelle Folks ordered. Not taking the Omeprazole on her home med list. She doesn't want to take reflux medication because she has no GERD symptoms.   Previous GI Endoscopies / Labs / Imaging   EGD  Nov 2021 for anemia and abdominal pain  - The esophagus revealed a mild esophagitis (erythema and edema). - The stomach was normal save it save a small hiatal hernia. - The examined duodenum was normal. - The cardia and gastric fundus were normal on retroflexion.  Colonoscopy Nov 2021 for anemia, constipation and abdominal pain - Diverticulosis in the entire examined colon. - Internal hemorrhoids. - The examination was otherwise normal on direct and retroflexion views. - No specimens collected.     Latest Ref Rng & Units 03/02/2023   11:50 AM 09/01/2022   11:43 AM 03/09/2022   10:21 AM  Hepatic Function  Total Protein 6.0 - 8.3 g/dL 7.1  7.5  6.9   Albumin 3.5 - 5.2 g/dL 4.1  4.4  4.3   AST 0 - 37 U/L 20  21  17    ALT 0 - 35 U/L 12  22  13    Alk Phosphatase 39 - 117 U/L 70  63  66   Total Bilirubin 0.2 - 1.2 mg/dL 0.4  0.3  0.4   Bilirubin, Direct 0.0 - 0.3 mg/dL 0.1  0.1  0.1        Latest Ref Rng & Units 03/02/2023   11:50 AM 09/01/2022   11:43 AM 03/09/2022  10:21 AM  CBC  WBC 4.0 - 10.5 K/uL 11.6  12.0  12.1   Hemoglobin 12.0 - 15.0 g/dL 9.6  82.9  9.9   Hematocrit 36.0 - 46.0 % 30.0  32.8  31.6   Platelets 150.0 - 400.0 K/uL 549.0  405.0  410.0      Past Medical History:  Diagnosis Date   AIN III (anal intraepithelial neoplasia III) 06/26/2015   AIN II-III of internal hemorrhoids   Aortic atherosclerosis (HCC)    Chronic diarrhea    COPD  GOLD 0 / active smoker  10/24/2015   Diverticulitis    Diverticulosis    Esophageal stricture    Heart murmur    Hemorrhoids    Hyperglycemia 05/01/2013   Hypertension    Ischemic optic neuropathy, left eye 05/25/2019   Pneumonia 05/01/2013   PVD (peripheral vascular disease) (HCC)    Urine incontinence     Past Surgical History:   Procedure Laterality Date   ABDOMINAL HYSTERECTOMY     CARPAL TUNNEL RELEASE Right 09/2019   HEMORRHOID SURGERY N/A 06/26/2015   Procedure: INTERNAL AND EXTERNAL HEMORRHOIDECTOMY;  Surgeon: Violeta Gelinas, MD;  Location: [facility];  Service: General;  Laterality: N/A;   LOOP RECORDER INSERTION N/A 05/25/2019   Procedure: LOOP RECORDER INSERTION;  Surgeon: Regan Lemming, MD;  Location: [facility];  Service: Cardiovascular;  Laterality: N/A;    Current Medications, Allergies, Family History and Social History were reviewed in [employer] record.     Current Outpatient Medications  Medication Sig Dispense Refill   atorvastatin (LIPITOR) 40 MG tablet TAKE 1 TABLET BY MOUTH DAILY AT 6 PM. 90 tablet 3   bisacodyl (DULCOLAX) 5 MG EC tablet Take 5 mg by mouth daily as needed for moderate constipation. Patient states she takes 6 a day when she is constipated     clobetasol cream (TEMOVATE) 0.05 % Apply 1 Application topically 2 (two) times daily. 30 g 1   clopidogrel (PLAVIX) 75 MG tablet TAKE 1 TABLET BY MOUTH EVERY DAY 90 tablet 2   furosemide (LASIX) 20 MG tablet TAKE 1 TABLET BY MOUTH EVERY DAY 90 tablet 3   gabapentin (NEURONTIN) 100 MG capsule Take 1 capsule (100 mg total) by mouth 3 (three) times daily. 90 capsule 3   hydrocortisone (ANUSOL-HC) 2.5 % rectal cream Place 1 Application rectally 2 (two) times daily. 30 g 2   linaclotide (LINZESS) 145 MCG CAPS capsule Take 1 capsule (145 mcg total) by mouth daily before breakfast. 90 capsule 3   LORazepam (ATIVAN) 0.5 MG tablet 1-2 tabs 30 - 60 min prior to MRI. Do not drive with this medicine. 4 tablet 0   losartan (COZAAR) 50 MG tablet Take 1 tablet (50 mg total) by mouth daily. 90 tablet 3   NIFEdipine (PROCARDIA XL/NIFEDICAL-XL) 90 MG 24 hr tablet TAKE 1 TABLET BY MOUTH EVERY DAY 90 tablet 3   omeprazole (PRILOSEC) 20 MG capsule Take 1 capsule (20 mg total) by mouth daily. 90 capsule 3    PARoxetine (PAXIL) 10 MG tablet Take 1 tablet (10 mg total) by mouth daily. 90 tablet 3   tiZANidine (ZANAFLEX) 2 MG tablet Take 1 tablet (2 mg total) by mouth every 6 (six) hours as needed for muscle spasms. 30 tablet 2   topiramate (TOPAMAX) 25 MG tablet Take 1 tablet (25 mg total) by mouth 2 (two) times daily. 180 tablet 1   traMADol (ULTRAM) 50 MG tablet Take 1 tablet (50  mg total) by mouth 3 (three) times daily as needed. 60 tablet 2   traZODone (DESYREL) 50 MG tablet TAKE 0.5-1 TABLETS BY MOUTH AT BEDTIME AS NEEDED FOR SLEEP. 90 tablet 1   No current facility-administered medications for this visit.    Review of Systems: No chest pain. No shortness of breath. No urinary complaints.    Physical Exam  Wt Readings from Last 3 Encounters:  05/14/23 109 lb (49.4 kg)  03/02/23 110 lb (49.9 kg)  03/02/23 110 lb (49.9 kg)    BP 130/62   Pulse 80   Ht 5\' 2"  (1.575 m)   Wt 109 lb (49.4 kg)   BMI 19.94 kg/m  Constitutional:  Pleasant, generally well appearing female in no acute distress. Psychiatric: Normal mood and affect. Behavior is normal. EENT: Pupils normal.  Conjunctivae are normal. No scleral icterus. Neck supple.  Cardiovascular: Normal rate, regular rhythm.  Pulmonary/chest: Effort normal and breath sounds normal. No wheezing, rales or rhonchi. Abdominal: Soft, nondistended, nontender. Bowel sounds active throughout. There are no masses palpable. No hepatomegaly. Neurological: Alert and oriented to person place and time.  Skin: Skin is warm and dry. No rashes noted.  Willette Cluster, NP  05/14/2023, 11:15 AM

## 2023-05-14 NOTE — Progress Notes (Signed)
Assessment and plans noted ?

## 2023-05-14 NOTE — Patient Instructions (Addendum)
_______________________________________________________  If your blood pressure at your visit was 140/90 or greater, please contact your primary care physician to follow up on this. _______________________________________________________  If you are age 79 or older, your body mass index should be between 23-30. Your Body mass index is 19.94 kg/m. If this is out of the aforementioned range listed, please consider follow up with your Primary Care Provider. ________________________________________________________  The [facility] providers would like to encourage you to use [facility] to communicate with providers for non-urgent requests or questions.  Due to long hold times on the telephone, sending your provider a message by [facility] may be a faster and more efficient way to get a response.  Please allow 48 business hours for a response.  Please remember that this is for non-urgent requests.  _______________________________________________________  INCREASE water to at least 48 ounces per day.  We have sent the following medications to your pharmacy for you to pick up at your convenience:  START: Linzess  take one capsule daily every morning on an empty stomach.  Linzess works best when taken once a day every day, on an empty stomach, at least 30 minutes before your first meal of the day.  When Linzess  is taken daily as directed:  *Constipation relief is typically felt in about a week *IBS-C patients may begin to experience relief from belly pain and overall abdominal symptoms (pain, discomfort, and bloating) in about 1 week,   with symptoms typically improving over 12 weeks.  Diarrhea may occur in the first 2 weeks -keep taking it.  The diarrhea should go away and you should start having normal, complete, full bowel movements. It may be helpful to start treatment when you can be near the comfort of your own bathroom, such as a weekend.   You are scheduled to follow up on 07-28-23 at  11:00am.  Thank you for entrusting me with your care and choosing [facility].  Willette Cluster, NP

## 2023-07-20 ENCOUNTER — Telehealth: Payer: Self-pay | Admitting: Internal Medicine

## 2023-07-20 MED ORDER — TRAZODONE HCL 50 MG PO TABS: 50.0000 mg | ORAL_TABLET | Freq: Every evening | ORAL | 1 refills | Status: DC | PRN

## 2023-07-20 NOTE — Telephone Encounter (Signed)
Sent trazodone to [facility]Mar[facility] Kitchen/l,mb

## 2023-07-20 NOTE — Telephone Encounter (Signed)
Prescription Request  07/20/2023  LOV: 03/02/2023  What is the name of the medication or equipment? traZODone (DESYREL) 50 MG tablet   Have you contacted your pharmacy to request a refill? Yes   Which pharmacy would you like this sent to?  [id number] [place], [place] - [address] [place] [address] Phone: [phone] Fax: [phone]    Patient notified that their request is being sent to the clinical staff for review and that they should receive a response within 2 business days.   Please advise at [facility] [phone]

## 2023-07-27 ENCOUNTER — Ambulatory Visit (INDEPENDENT_AMBULATORY_CARE_PROVIDER_SITE_OTHER): Payer: Medicare Other | Admitting: Emergency Medicine

## 2023-07-27 ENCOUNTER — Encounter: Payer: Self-pay | Admitting: Emergency Medicine

## 2023-07-27 ENCOUNTER — Ambulatory Visit (INDEPENDENT_AMBULATORY_CARE_PROVIDER_SITE_OTHER): Payer: Medicare Other

## 2023-07-27 VITALS — BP 136/66 | HR 74 | Temp 98.6°F | Ht 62.0 in | Wt 108.5 lb

## 2023-07-27 DIAGNOSIS — R051 Acute cough: Secondary | ICD-10-CM | POA: Diagnosis not present

## 2023-07-27 DIAGNOSIS — M25511 Pain in right shoulder: Secondary | ICD-10-CM | POA: Diagnosis not present

## 2023-07-27 DIAGNOSIS — G8929 Other chronic pain: Secondary | ICD-10-CM

## 2023-07-27 DIAGNOSIS — J22 Unspecified acute lower respiratory infection: Secondary | ICD-10-CM | POA: Diagnosis not present

## 2023-07-27 DIAGNOSIS — R911 Solitary pulmonary nodule: Secondary | ICD-10-CM | POA: Diagnosis not present

## 2023-07-27 DIAGNOSIS — R1033 Periumbilical pain: Secondary | ICD-10-CM

## 2023-07-27 DIAGNOSIS — M19011 Primary osteoarthritis, right shoulder: Secondary | ICD-10-CM | POA: Diagnosis not present

## 2023-07-27 DIAGNOSIS — R0989 Other specified symptoms and signs involving the circulatory and respiratory systems: Secondary | ICD-10-CM | POA: Diagnosis not present

## 2023-07-27 LAB — COMPREHENSIVE METABOLIC PANEL
ALT: 17 U/L (ref 0–35)
AST: 30 U/L (ref 0–37)
Albumin: 4.5 g/dL (ref 3.5–5.2)
Alkaline Phosphatase: 85 U/L (ref 39–117)
BUN: 18 mg/dL (ref 6–23)
CO2: 25 mEq/L (ref 19–32)
Calcium: 9.5 mg/dL (ref 8.4–10.5)
Chloride: 104 mEq/L (ref 96–112)
Creatinine, Ser: 1.43 mg/dL — ABNORMAL HIGH (ref 0.40–1.20)
GFR: 34.9 mL/min — ABNORMAL LOW (ref >60.00)
Glucose, Bld: 99 mg/dL (ref 70–99)
Potassium: 4.1 mEq/L (ref 3.5–5.1)
Sodium: 138 mEq/L (ref 135–145)
Total Bilirubin: 0.3 mg/dL (ref 0.2–1.2)
Total Protein: 7.4 g/dL (ref 6.0–8.3)

## 2023-07-27 LAB — CBC WITH DIFFERENTIAL/PLATELET
Basophils Absolute: 0.1 10*3/uL (ref 0.0–0.1)
Basophils Relative: 1.1 % (ref 0.0–3.0)
Eosinophils Absolute: 0.2 10*3/uL (ref 0.0–0.7)
Eosinophils Relative: 2.4 % (ref 0.0–5.0)
HCT: 31.6 % — ABNORMAL LOW (ref 36.0–46.0)
Hemoglobin: 9.8 g/dL — ABNORMAL LOW (ref 12.0–15.0)
Lymphocytes Relative: 7.2 % — ABNORMAL LOW (ref 12.0–46.0)
Lymphs Abs: 0.7 10*3/uL (ref 0.7–4.0)
MCHC: 31.1 g/dL (ref 30.0–36.0)
MCV: 73.1 fl — ABNORMAL LOW (ref 78.0–100.0)
Monocytes Absolute: 1 10*3/uL (ref 0.1–1.0)
Monocytes Relative: 9.4 % (ref 3.0–12.0)
Neutro Abs: 8.3 10*3/uL — ABNORMAL HIGH (ref 1.4–7.7)
Neutrophils Relative %: 79.9 % — ABNORMAL HIGH (ref 43.0–77.0)
Platelets: 539 10*3/uL — ABNORMAL HIGH (ref 150.0–400.0)
RBC: 4.33 Mil/uL (ref 3.87–5.11)
RDW: 18.8 % — ABNORMAL HIGH (ref 11.5–15.5)
WBC: 10.3 10*3/uL (ref 4.0–10.5)

## 2023-07-27 LAB — URINALYSIS, ROUTINE W REFLEX MICROSCOPIC
Bilirubin Urine: NEGATIVE
Ketones, ur: NEGATIVE
Leukocytes,Ua: NEGATIVE
Nitrite: NEGATIVE
RBC / HPF: NONE SEEN (ref 0–?)
Specific Gravity, Urine: 1.015 (ref 1.000–1.030)
Total Protein, Urine: 100 — AB
Urine Glucose: 100 — AB
Urobilinogen, UA: 0.2 (ref 0.0–1.0)
pH: 6.5 (ref 5.0–8.0)

## 2023-07-27 LAB — LIPASE: Lipase: 42 U/L (ref 11.0–59.0)

## 2023-07-27 MED ORDER — AZITHROMYCIN 250 MG PO TABS
ORAL_TABLET | ORAL | 0 refills | Status: DC
Start: 2023-07-27 — End: 2024-08-21

## 2023-07-27 NOTE — Patient Instructions (Signed)
Abdominal Pain, Adult  Many things can cause belly (abdominal) pain. In most cases, belly pain is not a serious problem and can be watched and treated at home. But in some cases, it can be serious. Your doctor will try to find the cause of your belly pain. Follow these instructions at home: Medicines Take over-the-counter and prescription medicines only as told by your doctor. Do not take medicines that help you poop (laxatives) unless told by your doctor. General instructions Watch your belly pain for any changes. Tell your doctor if the pain gets worse. Drink enough fluid to keep your pee (urine) pale yellow. Contact a doctor if: Your belly pain changes or gets worse. You have very bad cramping or bloating in your belly. You vomit. Your pain gets worse with meals, after eating, or with certain foods. You have trouble pooping or have watery poop for more than 2-3 days. You are not hungry, or you lose weight without trying. You have signs of not getting enough fluid or water (dehydration). These may include: Dark pee, very little pee, or no pee. Cracked lips or dry mouth. Feeling sleepy or weak. You have pain when you pee or poop. Your belly pain wakes you up at night. You have blood in your pee. You have a fever. Get help right away if: You cannot stop vomiting. Your pain is only in one part of your belly, like on the right side. You have bloody or black poop, or poop that looks like tar. You have trouble breathing. You have chest pain. These symptoms may be an emergency. Get help right away. Call 911. Do not wait to see if the symptoms will go away. Do not drive yourself to the hospital. This information is not intended to replace advice given to you by your health care provider. Make sure you discuss any questions you have with your health care provider. Document Revised: 09/30/2022 Document Reviewed: 09/30/2022 Elsevier Patient Education  2024 [employer]  

## 2023-07-27 NOTE — Assessment & Plan Note (Signed)
No pneumonia on chest x-ray report Clinically stable Chronic smoker Recommend daily azithromycin for 5 days

## 2023-07-27 NOTE — Assessment & Plan Note (Signed)
Cough management discussed Recommend antibiotic treatment May take over-the-counter Mucinex DM as needed

## 2023-07-27 NOTE — Assessment & Plan Note (Signed)
Degenerative changes seen on x-ray No significant physical findings No significant pain or tenderness May need orthopedic evaluation

## 2023-07-27 NOTE — Progress Notes (Signed)
Elaine Turner 77 y.o.   Chief Complaint  Patient presents with   Abdominal Pain    Having some abd pain, mostly in the center   URI    Sinus congestion , headache, x 3-4 days    Cough    HISTORY OF PRESENT ILLNESS: Acute problem visit today.  Patient of Dr. Oliver Barre This is a 77 y.o. female complaining of 3 things: 1.  By cold for 3 days.  Productive cough with headache.  Still smoking.  Denies difficulty breathing fever or chills. 2.  Right shoulder popping but not hurting for months 3.  Chronic mid abdominal pain without associated symptoms.  Able to eat and drink.  Denies nausea or vomiting No other associated symptoms.  Denies urinary symptoms. No other complaints or medical concerns today.  Abdominal Pain Pertinent negatives include no diarrhea, fever, headaches, nausea or vomiting.  URI  Associated symptoms include abdominal pain, congestion and coughing. Pertinent negatives include no chest pain, diarrhea, headaches, nausea, rash, sore throat or vomiting.  Cough Pertinent negatives include no chest pain, chills, fever, headaches, rash, sore throat or shortness of breath.     Prior to Admission medications   Medication Sig Start Date End Date Taking? Authorizing Provider  atorvastatin (LIPITOR) 40 MG tablet TAKE 1 TABLET BY MOUTH DAILY AT 6 PM. 09/01/22  Yes Corwin Levins, MD  bisacodyl (DULCOLAX) 5 MG EC tablet Take 5 mg by mouth daily as needed for moderate constipation. Patient states she takes 6 a day when she is constipated   Yes [provider]  clobetasol cream (TEMOVATE) 0.05 % Apply 1 Application topically 2 (two) times daily. 12/25/22  Yes Corwin Levins, MD  clopidogrel (PLAVIX) 75 MG tablet TAKE 1 TABLET BY MOUTH EVERY DAY 02/19/23  Yes Corwin Levins, MD  furosemide (LASIX) 20 MG tablet TAKE 1 TABLET BY MOUTH EVERY DAY 04/07/23  Yes Corwin Levins, MD  gabapentin (NEURONTIN) 100 MG capsule Take 1 capsule (100 mg total) by mouth 3 (three) times daily.  12/25/22  Yes Corwin Levins, MD  hydrocortisone (ANUSOL-HC) 2.5 % rectal cream Place 1 Application rectally 2 (two) times daily. 10/30/22  Yes Doree Albee, PA-C  linaclotide [facility]) 72 MCG capsule Take 1 capsule (72 mcg total) by mouth daily before breakfast. 05/14/23  Yes Meredith Pel, NP  LORazepam (ATIVAN) 0.5 MG tablet 1-2 tabs 30 - 60 min prior to MRI. Do not drive with this medicine. 09/10/22  Yes Corwin Levins, MD  losartan (COZAAR) 50 MG tablet Take 1 tablet (50 mg total) by mouth daily. 09/10/22  Yes Corwin Levins, MD  NIFEdipine (PROCARDIA XL/NIFEDICAL-XL) 90 MG 24 hr tablet TAKE 1 TABLET BY MOUTH EVERY DAY 09/10/22  Yes Corwin Levins, MD  PARoxetine (PAXIL) 10 MG tablet Take 1 tablet (10 mg total) by mouth daily. 09/01/22  Yes Corwin Levins, MD  tiZANidine (ZANAFLEX) 2 MG tablet Take 1 tablet (2 mg total) by mouth every 6 (six) hours as needed for muscle spasms. 06/10/22  Yes Rodolph Bong, MD  topiramate (TOPAMAX) 25 MG tablet Take 1 tablet (25 mg total) by mouth 2 (two) times daily. 01/19/22  Yes Corwin Levins, MD  traMADol (ULTRAM) 50 MG tablet Take 1 tablet (50 mg total) by mouth 3 (three) times daily as needed. 01/19/22  Yes Corwin Levins, MD  traZODone (DESYREL) 50 MG tablet Take 1 tablet (50 mg total) by mouth at bedtime as needed for  sleep. 07/20/23  Yes Corwin Levins, MD  citalopram (CELEXA) 10 MG tablet Take 1 tablet (10 mg total) by mouth daily. 01/24/20 09/01/20  Corwin Levins, MD  fluticasone (FLONASE) 50 MCG/ACT nasal spray Place 2 sprays into both nostrils daily. Patient taking differently: Place 2 sprays into both nostrils as needed for rhinitis.  07/28/18 09/01/20  Corwin Levins, MD    Allergies  Allergen Reactions   Ace Inhibitors Cough   Levaquin [Levofloxacin] Other (See Comments)    GI upset    Patient Active Problem List   Diagnosis Date Noted   Skin lesion 12/25/2022   Vision loss of left eye 12/25/2022   History of completed stroke 09/01/2022   Mass of right  side of neck 09/01/2022   Pulmonary nodule 08/09/2021   Elevated sed rate 08/05/2021   Thyromegaly 03/04/2021   Lung nodule 03/04/2021   Aortic atherosclerosis (HCC) 11/12/2020   B12 deficiency 10/10/2020   Vitamin D deficiency 10/10/2020   Fatigue 10/03/2020   Anxiety and depression 01/24/2020   Central retinal artery occlusion 05/25/2019   Ischemic optic neuropathy of left eye 05/24/2019   Tobacco dependence 05/24/2019   CKD (chronic kidney disease) stage 4, GFR 15-29 ml/min (HCC) 05/24/2019   Abdominal pain 08/18/2018   Allergic conjunctivitis 07/28/2018   Acute respiratory distress 09/30/2017   Weight loss 06/17/2017   Constipation 06/17/2017   Skin tag 03/19/2017   COPD  GOLD 0 / active smoker  10/24/2015   Solitary pulmonary nodule 05/31/2015   Anemia, unspecified 05/15/2013   Hypokalemia 05/08/2013   Hemorrhoids 05/02/2013   Hyponatremia 05/01/2013   Hyperglycemia 05/01/2013   Atherosclerosis of native arteries of the extremities with intermittent claudication 10/14/2012   OA (osteoarthritis) of hip 09/08/2012   Cigarette smoker 09/08/2012   Insomnia 09/08/2012   Hyperlipidemia 09/08/2012   Heart murmur, aortic 09/08/2012   Hypertension    PVD (peripheral vascular disease) (HCC)     Past Medical History:  Diagnosis Date   AIN III (anal intraepithelial neoplasia III) 06/26/2015   AIN II-III of internal hemorrhoids   Aortic atherosclerosis (HCC)    Chronic diarrhea    COPD  GOLD 0 / active smoker  10/24/2015   Diverticulitis    Diverticulosis    Esophageal stricture    Heart murmur    Hemorrhoids    Hyperglycemia 05/01/2013   Hypertension    Ischemic optic neuropathy, left eye 05/25/2019   Pneumonia 05/01/2013   PVD (peripheral vascular disease) (HCC)    Urine incontinence     Past Surgical History:  Procedure Laterality Date   ABDOMINAL HYSTERECTOMY     CARPAL TUNNEL RELEASE Right 09/2019   HEMORRHOID SURGERY N/A 06/26/2015   Procedure: INTERNAL AND  EXTERNAL HEMORRHOIDECTOMY;  Surgeon: Violeta Gelinas, MD;  Location: [facility];  Service: General;  Laterality: N/A;   LOOP RECORDER INSERTION N/A 05/25/2019   Procedure: LOOP RECORDER INSERTION;  Surgeon: Regan Lemming, MD;  Location: [facility];  Service: Cardiovascular;  Laterality: N/A;    Social History   Socioeconomic History   Marital status: Widowed    Spouse name: Not on file   Number of children: 1   Years of education: 75   Highest education level: Not on file  Occupational History   Occupation: retired  Tobacco Use   Smoking status: Every Day    Current packs/day: 1.00    Average packs/day: 1 pack/day for 62.0 years (62.0 ttl pk-yrs)    Types: Cigarettes  Smokeless tobacco: Never  Vaping Use   Vaping status: Never Used  Substance and Sexual Activity   Alcohol use: Yes    Comment: occ.   Drug use: No   Sexual activity: Not Currently  Other Topics Concern   Not on file  Social History Narrative   Lives alone   Right handed   Drinks 2-3 cups caffeine daily   Social Determinants of Health   Financial Resource Strain: Not on file  Food Insecurity: Not on file  Transportation Needs: Not on file  Physical Activity: Not on file  Stress: Not on file  Social Connections: Not on file  Intimate Partner Violence: Not on file    Family History  Problem Relation Age of Onset   Hypertension Mother    Lung cancer Father    Colon cancer Neg Hx    Esophageal cancer Neg Hx    Pancreatic cancer Neg Hx    Liver disease Neg Hx    Stroke Neg Hx      Review of Systems  Constitutional: Negative.  Negative for chills and fever.  HENT:  Positive for congestion. Negative for sore throat.   Respiratory:  Positive for cough and sputum production. Negative for shortness of breath.   Cardiovascular: Negative.  Negative for chest pain and palpitations.  Gastrointestinal:  Positive for abdominal pain. Negative for blood in stool, diarrhea, nausea  and vomiting.  Skin: Negative.  Negative for rash.  Neurological: Negative.  Negative for dizziness and headaches.    Vitals:   07/27/23 0916  BP: 136/66  Pulse: 74  Temp: 98.6 F (37 C)  SpO2: 95%    Physical Exam Vitals reviewed.  Constitutional:      Appearance: She is well-developed.  HENT:     Head: Normocephalic.     Mouth/Throat:     Mouth: Mucous membranes are moist.     Pharynx: Oropharynx is clear.  Eyes:     Extraocular Movements: Extraocular movements intact.     Conjunctiva/sclera: Conjunctivae normal.     Pupils: Pupils are equal, round, and reactive to light.  Cardiovascular:     Rate and Rhythm: Normal rate and regular rhythm.     Pulses: Normal pulses.     Heart sounds: Normal heart sounds.  Pulmonary:     Effort: Pulmonary effort is normal.     Breath sounds: Normal breath sounds.  Abdominal:     General: There is no distension.     Palpations: Abdomen is soft.     Tenderness: There is no abdominal tenderness.  Musculoskeletal:     Cervical back: No tenderness.     Comments: Right shoulder: Full range of motion.  No abnormal findings.  Lymphadenopathy:     Cervical: No cervical adenopathy.  Skin:    General: Skin is warm and dry.  Neurological:     General: No focal deficit present.     Mental Status: She is alert and oriented to person, place, and time.  Psychiatric:        Mood and Affect: Mood normal.        Behavior: Behavior normal.    DG Shoulder Right  Result Date: 07/27/2023 CLINICAL DATA:  Right shoulder pain. EXAM: RIGHT SHOULDER - 2+ VIEW COMPARISON:  None Available. FINDINGS: No fracture or dislocation. Degenerative changes are seen at the acromioclavicular and glenohumeral joints. No worrisome lytic or sclerotic osseous abnormality. IMPRESSION: Degenerative changes at the acromioclavicular and glenohumeral joints. No acute bony findings. Electronically Signed  By: Kennith Center M.D.   On: 07/27/2023 11:09   DG Chest 2  View  Result Date: 07/27/2023 CLINICAL DATA:  I thin cough and congestion. EXAM: CHEST - 2 VIEW COMPARISON:  09/01/2022 FINDINGS: The cardio pericardial silhouette is enlarged. There is pulmonary vascular congestion without overt pulmonary edema. Right lower lobe pulmonary nodule overlies the anterior sixth rib. No focal consolidation or pleural effusion. No acute bony abnormality. IMPRESSION: 1. Enlargement of the cardiopericardial silhouette with pulmonary vascular congestion. 2. Right lower lobe pulmonary nodule. Chest CT without contrast recommended to further evaluate. Electronically Signed   By: Kennith Center M.D.   On: 07/27/2023 11:08     ASSESSMENT & PLAN: A total of 45 minutes was spent with the patient and counseling/coordination of care regarding preparing for this visit, review of most recent office visit notes, review of chronic medical conditions under management, review of all medications, treatment of lower respiratory infection and need for antibiotics, differential diagnosis of abdominal pain, need for blood work today, ED precautions, pain and cough management, prognosis, documentation and need for follow-up if no better or worse during the next several days   Problem List Items Addressed This Visit       Respiratory   Lower respiratory infection    No pneumonia on chest x-ray report Clinically stable Chronic smoker Recommend daily azithromycin for 5 days      Relevant Medications   azithromycin (ZITHROMAX) 250 MG tablet   Other Relevant Orders   DG Chest 2 View (Completed)     Other   Acute cough    Cough management discussed Recommend antibiotic treatment May take over-the-counter Mucinex DM as needed      Abdominal pain - Primary    Clinically stable.  No red flag signs or symptoms. Benign abdominal examination Recommend blood work today Diet and nutrition discussed ED precautions given Advised to rest and stay well-hydrated       Relevant Orders    Urinalysis   Comprehensive metabolic panel   CBC with Differential/Platelet   Lipase   Chronic right shoulder pain    Degenerative changes seen on x-ray No significant physical findings No significant pain or tenderness May need orthopedic evaluation      Relevant Orders   DG Shoulder Right (Completed)   Patient Instructions  Abdominal Pain, Adult  Many things can cause belly (abdominal) pain. In most cases, belly pain is not a serious problem and can be watched and treated at home. But in some cases, it can be serious. Your doctor will try to find the cause of your belly pain. Follow these instructions at home: Medicines Take over-the-counter and prescription medicines only as told by your doctor. Do not take medicines that help you poop (laxatives) unless told by your doctor. General instructions Watch your belly pain for any changes. Tell your doctor if the pain gets worse. Drink enough fluid to keep your pee (urine) pale yellow. Contact a doctor if: Your belly pain changes or gets worse. You have very bad cramping or bloating in your belly. You vomit. Your pain gets worse with meals, after eating, or with certain foods. You have trouble pooping or have watery poop for more than 2-3 days. You are not hungry, or you lose weight without trying. You have signs of not getting enough fluid or water (dehydration). These may include: Dark pee, very little pee, or no pee. Cracked lips or dry mouth. Feeling sleepy or weak. You have pain when you pee  or poop. Your belly pain wakes you up at night. You have blood in your pee. You have a fever. Get help right away if: You cannot stop vomiting. Your pain is only in one part of your belly, like on the right side. You have bloody or black poop, or poop that looks like tar. You have trouble breathing. You have chest pain. These symptoms may be an emergency. Get help right away. Call 911. Do not wait to see if the symptoms will go  away. Do not drive yourself to the hospital. This information is not intended to replace advice given to you by your health care provider. Make sure you discuss any questions you have with your health care provider. Document Revised: 09/30/2022 Document Reviewed: 09/30/2022 Elsevier Patient Education  2024 Elsevier Inc.     Edwina Barth, MD [facility]

## 2023-07-27 NOTE — Assessment & Plan Note (Signed)
Clinically stable.  No red flag signs or symptoms. Benign abdominal examination Recommend blood work today Diet and nutrition discussed ED precautions given Advised to rest and stay well-hydrated

## 2023-07-28 ENCOUNTER — Ambulatory Visit: Payer: Medicare Other | Admitting: Nurse Practitioner

## 2023-07-28 NOTE — Progress Notes (Deleted)
Primary GI:  ASSESSMENT & PLAN   Brief Narrative:  77 y.o.  female whose past medical history includes,  but is not necessarily limited to, chronic anemia, GERD, chronic constipation, diverticulitis, optic stroke for which she is on plavix,    ***  HPI   Brief GI history Last seen here 05/14/23 for evaluation chronic constipation and early satiety.  At a previous visit she had been given samples of Linzess but it was causing diarrhea.  I asked her to continue Linzess for a while to see if the diarrhea would resolve.    ( see endoscopic reports below)   Interval History    Chief complaint :         Procedure risk assessment:  No history of CHF.  No supplemental 02 use at home.  Not a known difficult airway Anticoagulant:     Previous GI Endoscopies / Labs / Imaging   **May not include all endoscopic evaluations         Latest Ref Rng & Units 07/27/2023   10:15 AM 03/02/2023   11:50 AM 09/01/2022   11:43 AM  Hepatic Function  Total Protein 6.0 - 8.3 g/dL 7.4  7.1  7.5   Albumin 3.5 - 5.2 g/dL 4.5  4.1  4.4   AST 0 - 37 U/L 30  20  21    ALT 0 - 35 U/L 17  12  22    Alk Phosphatase 39 - 117 U/L 85  70  63   Total Bilirubin 0.2 - 1.2 mg/dL 0.3  0.4  0.3   Bilirubin, Direct 0.0 - 0.3 mg/dL  0.1  0.1        Latest Ref Rng & Units 07/27/2023   10:15 AM 03/02/2023   11:50 AM 09/01/2022   11:43 AM  CBC  WBC 4.0 - 10.5 K/uL 10.3  11.6  12.0   Hemoglobin 12.0 - 15.0 g/dL 9.8  9.6  69.6   Hematocrit 36.0 - 46.0 % 31.6  30.0  32.8   Platelets 150.0 - 400.0 K/uL 539.0  549.0  405.0      Past Medical History:  Diagnosis Date   AIN III (anal intraepithelial neoplasia III) 06/26/2015   AIN II-III of internal hemorrhoids   Aortic atherosclerosis (HCC)    Chronic diarrhea    COPD  GOLD 0 / active smoker  10/24/2015   Diverticulitis    Diverticulosis    Esophageal stricture    Heart murmur    Hemorrhoids    Hyperglycemia 05/01/2013   Hypertension    Ischemic optic  neuropathy, left eye 05/25/2019   Pneumonia 05/01/2013   PVD (peripheral vascular disease) (HCC)    Urine incontinence     Past Surgical History:  Procedure Laterality Date   ABDOMINAL HYSTERECTOMY     CARPAL TUNNEL RELEASE Right 09/2019   HEMORRHOID SURGERY N/A 06/26/2015   Procedure: INTERNAL AND EXTERNAL HEMORRHOIDECTOMY;  Surgeon: Violeta Gelinas, MD;  Location: [facility];  Service: General;  Laterality: N/A;   LOOP RECORDER INSERTION N/A 05/25/2019   Procedure: LOOP RECORDER INSERTION;  Surgeon: Regan Lemming, MD;  Location: [facility];  Service: Cardiovascular;  Laterality: N/A;    Family History  Problem Relation Age of Onset   Hypertension Mother    Lung cancer Father    Colon cancer Neg Hx    Esophageal cancer Neg Hx    Pancreatic cancer Neg Hx    Liver disease Neg Hx    Stroke  Neg Hx     Current Medications, Allergies, Family History and Social History were reviewed in [employer] record.     Current Outpatient Medications  Medication Sig Dispense Refill   atorvastatin (LIPITOR) 40 MG tablet TAKE 1 TABLET BY MOUTH DAILY AT 6 PM. 90 tablet 3   azithromycin (ZITHROMAX) 250 MG tablet Sig as indicated 6 tablet 0   bisacodyl (DULCOLAX) 5 MG EC tablet Take 5 mg by mouth daily as needed for moderate constipation. Patient states she takes 6 a day when she is constipated     clobetasol cream (TEMOVATE) 0.05 % Apply 1 Application topically 2 (two) times daily. 30 g 1   clopidogrel (PLAVIX) 75 MG tablet TAKE 1 TABLET BY MOUTH EVERY DAY 90 tablet 2   furosemide (LASIX) 20 MG tablet TAKE 1 TABLET BY MOUTH EVERY DAY 90 tablet 3   gabapentin (NEURONTIN) 100 MG capsule Take 1 capsule (100 mg total) by mouth 3 (three) times daily. 90 capsule 3   hydrocortisone (ANUSOL-HC) 2.5 % rectal cream Place 1 Application rectally 2 (two) times daily. 30 g 2   linaclotide (LINZESS) 72 MCG capsule Take 1 capsule (72 mcg total) by mouth daily  before breakfast. 30 capsule 3   LORazepam (ATIVAN) 0.5 MG tablet 1-2 tabs 30 - 60 min prior to MRI. Do not drive with this medicine. 4 tablet 0   losartan (COZAAR) 50 MG tablet Take 1 tablet (50 mg total) by mouth daily. 90 tablet 3   NIFEdipine (PROCARDIA XL/NIFEDICAL-XL) 90 MG 24 hr tablet TAKE 1 TABLET BY MOUTH EVERY DAY 90 tablet 3   PARoxetine (PAXIL) 10 MG tablet Take 1 tablet (10 mg total) by mouth daily. 90 tablet 3   tiZANidine (ZANAFLEX) 2 MG tablet Take 1 tablet (2 mg total) by mouth every 6 (six) hours as needed for muscle spasms. 30 tablet 2   topiramate (TOPAMAX) 25 MG tablet Take 1 tablet (25 mg total) by mouth 2 (two) times daily. 180 tablet 1   traMADol (ULTRAM) 50 MG tablet Take 1 tablet (50 mg total) by mouth 3 (three) times daily as needed. 60 tablet 2   traZODone (DESYREL) 50 MG tablet Take 1 tablet (50 mg total) by mouth at bedtime as needed for sleep. 90 tablet 1   No current facility-administered medications for this visit.    Review of Systems: No chest pain. No shortness of breath. No urinary complaints.    Physical Exam  Wt Readings from Last 3 Encounters:  07/27/23 108 lb 8 oz (49.2 kg)  05/14/23 109 lb (49.4 kg)  03/02/23 110 lb (49.9 kg)    There were no vitals taken for this visit. Constitutional:  Pleasant, generally well appearing ***female in no acute distress. Psychiatric: Normal mood and affect. Behavior is normal. EENT: Pupils normal.  Conjunctivae are normal. No scleral icterus. Neck supple.  Cardiovascular: Normal rate, regular rhythm.  Pulmonary/chest: Effort normal and breath sounds normal. No wheezing, rales or rhonchi. Abdominal: Soft, nondistended, nontender. Bowel sounds active throughout. There are no masses palpable. No hepatomegaly. Neurological: Alert and oriented to person place and time.  Extremities: *** edema Skin: Skin is warm and dry. No rashes noted.  Willette Cluster, NP  07/28/2023, 10:32 AM  Cc:  Corwin Levins,  MD

## 2023-07-29 ENCOUNTER — Telehealth: Payer: Self-pay | Admitting: Internal Medicine

## 2023-07-29 MED ORDER — HYDROCODONE BIT-HOMATROP MBR 5-1.5 MG/5ML PO SOLN: 5.0000 mL | Freq: Four times a day (QID) | ORAL | 0 refills | Status: AC | PRN

## 2023-07-29 NOTE — Telephone Encounter (Signed)
Ok done erx 

## 2023-07-29 NOTE — Telephone Encounter (Signed)
Patient was seen on the 30th.  Her cough is getting worse and she would like something called in for the cough.  Please send to [facility] on [id number]

## 2023-08-04 ENCOUNTER — Telehealth: Payer: Self-pay | Admitting: Internal Medicine

## 2023-08-04 NOTE — Telephone Encounter (Signed)
Patient was her on 7/31.  She wants to know if an inhaler can be sent in to her pharmacy.  States she use to be on Albuterol.   [facility] [address]  Please call patient and let her know.  [phone]

## 2023-08-05 MED ORDER — ALBUTEROL SULFATE HFA 108 (90 BASE) MCG/ACT IN AERS: 2.0000 | INHALATION_SPRAY | Freq: Four times a day (QID) | RESPIRATORY_TRACT | 0 refills | Status: DC | PRN

## 2023-08-05 NOTE — Telephone Encounter (Signed)
Tried calling pt no answer and no vm. Rx was sent to [facility].../lmb

## 2023-08-07 ENCOUNTER — Other Ambulatory Visit: Payer: Self-pay | Admitting: Internal Medicine

## 2023-08-10 ENCOUNTER — Ambulatory Visit: Payer: Medicare Other | Admitting: Internal Medicine

## 2023-08-11 ENCOUNTER — Ambulatory Visit (INDEPENDENT_AMBULATORY_CARE_PROVIDER_SITE_OTHER): Payer: Medicare Other | Admitting: Internal Medicine

## 2023-08-11 ENCOUNTER — Encounter: Payer: Self-pay | Admitting: Internal Medicine

## 2023-08-11 VITALS — BP 138/68 | HR 87 | Temp 98.6°F | Ht 62.0 in | Wt 101.4 lb

## 2023-08-11 DIAGNOSIS — R911 Solitary pulmonary nodule: Secondary | ICD-10-CM | POA: Diagnosis not present

## 2023-08-11 DIAGNOSIS — F17210 Nicotine dependence, cigarettes, uncomplicated: Secondary | ICD-10-CM

## 2023-08-11 DIAGNOSIS — J449 Chronic obstructive pulmonary disease, unspecified: Secondary | ICD-10-CM | POA: Diagnosis not present

## 2023-08-11 DIAGNOSIS — R739 Hyperglycemia, unspecified: Secondary | ICD-10-CM

## 2023-08-11 DIAGNOSIS — R509 Fever, unspecified: Secondary | ICD-10-CM | POA: Diagnosis not present

## 2023-08-11 DIAGNOSIS — N184 Chronic kidney disease, stage 4 (severe): Secondary | ICD-10-CM

## 2023-08-11 DIAGNOSIS — R059 Cough, unspecified: Secondary | ICD-10-CM

## 2023-08-11 MED ORDER — AMOXICILLIN-POT CLAVULANATE 875-125 MG PO TABS: 1.0000 | ORAL_TABLET | Freq: Two times a day (BID) | ORAL | 0 refills | Status: AC

## 2023-08-11 MED ORDER — CEFTRIAXONE SODIUM 1 G IJ SOLR
1.0000 g | Freq: Once | INTRAMUSCULAR | Status: AC
Start: 2023-08-11 — End: 2023-08-11
  Administered 2023-08-11: 1 g via INTRAMUSCULAR

## 2023-08-11 MED ORDER — HYDROCODONE BIT-HOMATROP MBR 5-1.5 MG/5ML PO SOLN: 5.0000 mL | Freq: Four times a day (QID) | ORAL | 0 refills | Status: AC | PRN

## 2023-08-11 NOTE — Assessment & Plan Note (Signed)
For ct chest f/u

## 2023-08-11 NOTE — Progress Notes (Signed)
Patient ID: Elaine Turner, female   DOB: 03-28-1946, 77 y.o.   MRN: [id number]        Chief Complaint: follow up persistent cough with fever, wt loss, feeling ill, sob, recent cxr with RLL nodule, copd       HPI:  Elaine Turner is a 77 y.o. female here with above x 2 wks despite zpack July 30, Cxr then neg for PNA but did have RLL pulm nodule with suggestion for CT chest w/o cm.  Still smoking , not ready to quit.  Also feels week, dizzy, cold at times.  Pt denies chest pain, orthopnea, PND, increased LE swelling, palpitations, dizziness or syncope.   Pt denies polydipsia, polyuria, or new focal neuro s/s.          Wt Readings from Last 3 Encounters:  08/11/23 101 lb 6.4 oz (46 kg)  07/27/23 108 lb 8 oz (49.2 kg)  05/14/23 109 lb (49.4 kg)   BP Readings from Last 3 Encounters:  08/11/23 138/68  07/27/23 136/66  05/14/23 130/62         Past Medical History:  Diagnosis Date   AIN III (anal intraepithelial neoplasia III) 06/26/2015   AIN II-III of internal hemorrhoids   Aortic atherosclerosis (HCC)    Chronic diarrhea    COPD  GOLD 0 / active smoker  10/24/2015   Diverticulitis    Diverticulosis    Esophageal stricture    Heart murmur    Hemorrhoids    Hyperglycemia 05/01/2013   Hypertension    Ischemic optic neuropathy, left eye 05/25/2019   Pneumonia 05/01/2013   PVD (peripheral vascular disease) (HCC)    Urine incontinence    Past Surgical History:  Procedure Laterality Date   ABDOMINAL HYSTERECTOMY     CARPAL TUNNEL RELEASE Right 09/2019   HEMORRHOID SURGERY N/A 06/26/2015   Procedure: INTERNAL AND EXTERNAL HEMORRHOIDECTOMY;  Surgeon: Violeta Gelinas, MD;  Location: [facility];  Service: General;  Laterality: N/A;   LOOP RECORDER INSERTION N/A 05/25/2019   Procedure: LOOP RECORDER INSERTION;  Surgeon: Regan Lemming, MD;  Location: [facility];  Service: Cardiovascular;  Laterality: N/A;    reports that she has been smoking cigarettes. She has a  62 pack-year smoking history. She has never used smokeless tobacco. She reports current alcohol use. She reports that she does not use drugs. family history includes Hypertension in her mother; Lung cancer in her father. Allergies  Allergen Reactions   Ace Inhibitors Cough   Levaquin [Levofloxacin] Other (See Comments)    GI upset   Current Outpatient Medications on File Prior to Visit  Medication Sig Dispense Refill   albuterol (VENTOLIN HFA) 108 (90 Base) MCG/ACT inhaler Inhale 2 puffs into the lungs every 6 (six) hours as needed for wheezing or shortness of breath. 6.7 each 0   atorvastatin (LIPITOR) 40 MG tablet TAKE 1 TABLET BY MOUTH DAILY AT 6 PM. 90 tablet 3   bisacodyl (DULCOLAX) 5 MG EC tablet Take 5 mg by mouth daily as needed for moderate constipation. Patient states she takes 6 a day when she is constipated     clobetasol cream (TEMOVATE) 0.05 % Apply 1 Application topically 2 (two) times daily. 30 g 1   clopidogrel (PLAVIX) 75 MG tablet TAKE 1 TABLET BY MOUTH EVERY DAY 90 tablet 2   furosemide (LASIX) 20 MG tablet TAKE 1 TABLET BY MOUTH EVERY DAY 90 tablet 3   gabapentin (NEURONTIN) 100 MG capsule Take 1 capsule (  100 mg total) by mouth 3 (three) times daily. 90 capsule 3   hydrocortisone (ANUSOL-HC) 2.5 % rectal cream Place 1 Application rectally 2 (two) times daily. 30 g 2   linaclotide (LINZESS) 72 MCG capsule Take 1 capsule (72 mcg total) by mouth daily before breakfast. 30 capsule 3   LORazepam (ATIVAN) 0.5 MG tablet 1-2 tabs 30 - 60 min prior to MRI. Do not drive with this medicine. 4 tablet 0   losartan (COZAAR) 50 MG tablet Take 1 tablet (50 mg total) by mouth daily. 90 tablet 3   NIFEdipine (PROCARDIA XL/NIFEDICAL-XL) 90 MG 24 hr tablet TAKE 1 TABLET BY MOUTH EVERY DAY 90 tablet 3   PARoxetine (PAXIL) 10 MG tablet Take 1 tablet (10 mg total) by mouth daily. 90 tablet 3   tiZANidine (ZANAFLEX) 2 MG tablet Take 1 tablet (2 mg total) by mouth every 6 (six) hours as needed for  muscle spasms. 30 tablet 2   topiramate (TOPAMAX) 25 MG tablet Take 1 tablet (25 mg total) by mouth 2 (two) times daily. 180 tablet 1   traMADol (ULTRAM) 50 MG tablet Take 1 tablet (50 mg total) by mouth 3 (three) times daily as needed. 60 tablet 2   traZODone (DESYREL) 50 MG tablet Take 1 tablet (50 mg total) by mouth at bedtime as needed for sleep. 90 tablet 1   azithromycin (ZITHROMAX) 250 MG tablet Sig as indicated (Patient not taking: Reported on 08/11/2023) 6 tablet 0   [DISCONTINUED] citalopram (CELEXA) 10 MG tablet Take 1 tablet (10 mg total) by mouth daily. 90 tablet 3   [DISCONTINUED] fluticasone (FLONASE) 50 MCG/ACT nasal spray Place 2 sprays into both nostrils daily. (Patient taking differently: Place 2 sprays into both nostrils as needed for rhinitis. ) 16 g 6   No current facility-administered medications on file prior to visit.        ROS:  All others reviewed and negative.  Objective        PE:  BP 138/68 (BP Location: Left Arm, Patient Position: Sitting, Cuff Size: Normal)   Pulse 87   Temp 98.6 F (37 C) (Oral)   Ht 5\' 2"  (1.575 m)   Wt 101 lb 6.4 oz (46 kg)   SpO2 96%   BMI 18.55 kg/m                 Constitutional: Pt appears in NAD, ill appearing               HENT: Head: NCAT.                Right Ear: External ear normal.                 Left Ear: External ear normal.                Eyes: . Pupils are equal, round, and reactive to light. Conjunctivae and EOM are normal               Nose: without d/c or deformity               Neck: Neck supple. Gross normal ROM               Cardiovascular: Normal rate and regular rhythm.                 Pulmonary/Chest: Effort normal and breath sounds decresae to left lower lung field, without rales or wheezing.  Abd:  Soft, NT, ND, + BS, no organomegaly               Neurological: Pt is alert. At baseline orientation, motor grossly intact               Skin: Skin is warm. No rashes, no other new lesions, LE  edema -none               Psychiatric: Pt behavior is normal without agitation   Micro: none  Cardiac tracings I have personally interpreted today:  none  Pertinent Radiological findings (summarize): none   Lab Results  Component Value Date   WBC 10.3 07/27/2023   HGB 9.8 (L) 07/27/2023   HCT 31.6 (L) 07/27/2023   PLT 539.0 (H) 07/27/2023   GLUCOSE 99 07/27/2023   CHOL 149 03/02/2023   TRIG 87.0 03/02/2023   HDL 78.60 03/02/2023   LDLCALC 53 03/02/2023   ALT 17 07/27/2023   AST 30 07/27/2023   NA 138 07/27/2023   K 4.1 07/27/2023   CL 104 07/27/2023   CREATININE 1.43 (H) 07/27/2023   BUN 18 07/27/2023   CO2 25 07/27/2023   TSH 0.37 03/02/2023   INR 1.1 05/24/2019   HGBA1C 5.9 03/02/2023   Assessment/Plan:  Elaine Turner is a 77 y.o. Black or African American [2] female with  has a past medical history of AIN III (anal intraepithelial neoplasia III) (06/26/2015), Aortic atherosclerosis (HCC), Chronic diarrhea, COPD  GOLD 0 / active smoker  (10/24/2015), Diverticulitis, Diverticulosis, Esophageal stricture, Heart murmur, Hemorrhoids, Hyperglycemia (05/01/2013), Hypertension, Ischemic optic neuropathy, left eye (05/25/2019), Pneumonia (05/01/2013), PVD (peripheral vascular disease) (HCC), and Urine incontinence.  Cough with fever July 30 cxr neg and wbc normal,, but appears ill despite zpack; suspect underlying pna - for rocephin 1 gm now, augmentin bid x 10 days, cough med prn, and stat CT r/o pna (also for pulm nodule eval)  Solitary pulmonary nodule For ct chest f/u   COPD  GOLD 0 / active smoker  O/w stable , no wheezing today, continue inhaler  Cigarette smoker Pt counsled to quit, pt not ready  Followup: Return if symptoms worsen or fail to improve.  Oliver Barre, MD 08/11/2023 2:06 PM [facility]

## 2023-08-11 NOTE — Patient Instructions (Signed)
You had the antibiotic shot today  Please take all new medication as prescribed - the antibiotic, and cough medicine as needed  Please continue all other medications as before, and refills have been done if requested.  Please have the pharmacy call with any other refills you may need.  Please keep your appointments with your specialists as you may have planned  You will be contacted regarding the referral for: CT scan chest

## 2023-08-11 NOTE — Assessment & Plan Note (Signed)
Pt counsled to quit, pt not ready °

## 2023-08-11 NOTE — Assessment & Plan Note (Signed)
O/w stable , no wheezing today, continue inhaler

## 2023-08-11 NOTE — Addendum Note (Signed)
Addended by: Aundra Millet on: 08/11/2023 02:26 PM   Modules accepted: Orders

## 2023-08-11 NOTE — Assessment & Plan Note (Signed)
July 30 cxr neg and wbc normal,, but appears ill despite zpack; suspect underlying pna - for rocephin 1 gm now, augmentin bid x 10 days, cough med prn, and stat CT r/o pna (also for pulm nodule eval)

## 2023-08-13 ENCOUNTER — Ambulatory Visit
Admission: RE | Admit: 2023-08-13 | Discharge: 2023-08-13 | Disposition: A | Discharge status: Home / Self Care | Payer: Medicare Other | Source: Ambulatory Visit | Attending: Internal Medicine | Admitting: Internal Medicine

## 2023-08-13 ENCOUNTER — Encounter: Payer: Self-pay | Admitting: Internal Medicine

## 2023-08-13 ENCOUNTER — Other Ambulatory Visit: Payer: Self-pay | Admitting: Internal Medicine

## 2023-08-13 DIAGNOSIS — J449 Chronic obstructive pulmonary disease, unspecified: Secondary | ICD-10-CM

## 2023-08-13 DIAGNOSIS — R509 Fever, unspecified: Secondary | ICD-10-CM

## 2023-08-13 DIAGNOSIS — I2510 Atherosclerotic heart disease of native coronary artery without angina pectoris: Secondary | ICD-10-CM | POA: Insufficient documentation

## 2023-08-13 DIAGNOSIS — R911 Solitary pulmonary nodule: Secondary | ICD-10-CM

## 2023-08-13 DIAGNOSIS — N2889 Other specified disorders of kidney and ureter: Secondary | ICD-10-CM | POA: Insufficient documentation

## 2023-08-13 DIAGNOSIS — I517 Cardiomegaly: Secondary | ICD-10-CM | POA: Diagnosis not present

## 2023-08-16 ENCOUNTER — Telehealth: Payer: Self-pay | Admitting: Internal Medicine

## 2023-08-16 ENCOUNTER — Other Ambulatory Visit: Payer: Self-pay | Admitting: Internal Medicine

## 2023-08-16 DIAGNOSIS — R911 Solitary pulmonary nodule: Secondary | ICD-10-CM

## 2023-08-16 DIAGNOSIS — N2889 Other specified disorders of kidney and ureter: Secondary | ICD-10-CM

## 2023-08-16 NOTE — Telephone Encounter (Signed)
It normally does not do this, so please also take Miralax 17 gm daily prn with this

## 2023-08-16 NOTE — Telephone Encounter (Signed)
Patient was seen by Dr. Jonny Ruiz on 08/11/23 and was prescribed amoxicillin-clavulanate (AUGMENTIN) 875-125 MG tablet. She thinks it may be causing constipation and wants to know if Dr. Jonny Ruiz would like for her to continue taking it. Patient would like a call back at [phone].

## 2023-08-17 NOTE — Telephone Encounter (Signed)
Called and let Pt know

## 2023-08-18 ENCOUNTER — Other Ambulatory Visit: Payer: Self-pay

## 2023-08-18 DIAGNOSIS — N2889 Other specified disorders of kidney and ureter: Secondary | ICD-10-CM

## 2023-08-24 ENCOUNTER — Encounter: Payer: Self-pay | Admitting: Internal Medicine

## 2023-08-24 ENCOUNTER — Ambulatory Visit (INDEPENDENT_AMBULATORY_CARE_PROVIDER_SITE_OTHER): Payer: Medicare Other | Admitting: Internal Medicine

## 2023-08-24 VITALS — BP 128/74 | HR 79 | Temp 97.9°F | Ht 62.0 in | Wt 103.0 lb

## 2023-08-24 DIAGNOSIS — N2889 Other specified disorders of kidney and ureter: Secondary | ICD-10-CM

## 2023-08-24 DIAGNOSIS — J309 Allergic rhinitis, unspecified: Secondary | ICD-10-CM | POA: Diagnosis not present

## 2023-08-24 DIAGNOSIS — F32A Depression, unspecified: Secondary | ICD-10-CM | POA: Diagnosis not present

## 2023-08-24 DIAGNOSIS — F419 Anxiety disorder, unspecified: Secondary | ICD-10-CM

## 2023-08-24 DIAGNOSIS — F5101 Primary insomnia: Secondary | ICD-10-CM | POA: Diagnosis not present

## 2023-08-24 MED ORDER — CITALOPRAM HYDROBROMIDE 10 MG PO TABS: 10.0000 mg | ORAL_TABLET | Freq: Every day | ORAL | 3 refills | Status: DC

## 2023-08-24 MED ORDER — TRAZODONE HCL 100 MG PO TABS: 100.0000 mg | ORAL_TABLET | Freq: Every day | ORAL | 1 refills | Status: DC

## 2023-08-24 NOTE — Assessment & Plan Note (Addendum)
Mild to mod, for add nasacort asd,  to f/u any worsening symptoms or concerns 

## 2023-08-24 NOTE — Assessment & Plan Note (Signed)
Mild to mod, for increased trazodone 100 at bedtime prn, to f/u any worsening symptoms or concerns

## 2023-08-24 NOTE — Progress Notes (Signed)
Patient ID: Elaine Turner, female   DOB: [birth year], 77 y.o.   MRN: [id number]        Chief Complaint: follow up worsening depression, renal lesion, allergies, insomnia       HPI:  Elaine Turner is a 77 y.o. female with mild to mod worsening depressive symptoms with tearful at times for now apparent reason to her, but no suicidal ideation or panic, but does also have worsening getting to sleep nights as well ; Pt denies chest pain, increased sob or doe, wheezing, orthopnea, PND, increased LE swelling, palpitations, dizziness or syncope.   Pt denies polydipsia, polyuria, or new focal neuro s/s.    Pt denies fever, wt loss, night sweats, loss of appetite, or other constitutional symptoms  Denies urinary symptoms such as dysuria, frequency, urgency, flank pain, hematuria or n/v, fever, chills. Does have several wks ongoing nasal allergy symptoms with clearish congestion, itch and sneezing, without fever, pain, ST, cough, swelling or wheezing.  Still smoking, not ready to quit.        Wt Readings from Last 3 Encounters:  08/24/23 103 lb (46.7 kg)  08/11/23 101 lb 6.4 oz (46 kg)  07/27/23 108 lb 8 oz (49.2 kg)   BP Readings from Last 3 Encounters:  08/24/23 128/74  08/11/23 138/68  07/27/23 136/66         Past Medical History:  Diagnosis Date   AIN III (anal intraepithelial neoplasia III) 06/26/2015   AIN II-III of internal hemorrhoids   Aortic atherosclerosis (HCC)    Chronic diarrhea    COPD  GOLD 0 / active smoker  10/24/2015   Diverticulitis    Diverticulosis    Esophageal stricture    Heart murmur    Hemorrhoids    Hyperglycemia 05/01/2013   Hypertension    Ischemic optic neuropathy, left eye 05/25/2019   Pneumonia 05/01/2013   PVD (peripheral vascular disease) (HCC)    Urine incontinence    Past Surgical History:  Procedure Laterality Date   ABDOMINAL HYSTERECTOMY     CARPAL TUNNEL RELEASE Right 09/2019   HEMORRHOID SURGERY N/A 06/26/2015   Procedure: INTERNAL AND EXTERNAL  HEMORRHOIDECTOMY;  Surgeon: Violeta Gelinas, MD;  Location: [facility];  Service: General;  Laterality: N/A;   LOOP RECORDER INSERTION N/A 05/25/2019   Procedure: LOOP RECORDER INSERTION;  Surgeon: Regan Lemming, MD;  Location: [facility];  Service: Cardiovascular;  Laterality: N/A;    reports that she has been smoking cigarettes. She has a 62 pack-year smoking history. She has never used smokeless tobacco. She reports current alcohol use. She reports that she does not use drugs. family history includes Hypertension in her mother; Lung cancer in her father. Allergies  Allergen Reactions   Ace Inhibitors Cough   Levaquin [Levofloxacin] Other (See Comments)    GI upset   Current Outpatient Medications on File Prior to Visit  Medication Sig Dispense Refill   albuterol (VENTOLIN HFA) 108 (90 Base) MCG/ACT inhaler Inhale 2 puffs into the lungs every 6 (six) hours as needed for wheezing or shortness of breath. 6.7 each 0   amoxicillin-clavulanate (AUGMENTIN) 875-125 MG tablet Take 1 tablet by mouth 2 (two) times daily. 20 tablet 0   atorvastatin (LIPITOR) 40 MG tablet TAKE 1 TABLET BY MOUTH DAILY AT 6 PM. 90 tablet 3   azithromycin (ZITHROMAX) 250 MG tablet Sig as indicated 6 tablet 0   bisacodyl (DULCOLAX) 5 MG EC tablet Take 5 mg by mouth daily as needed  for moderate constipation. Patient states she takes 6 a day when she is constipated     clobetasol cream (TEMOVATE) 0.05 % Apply 1 Application topically 2 (two) times daily. 30 g 1   clopidogrel (PLAVIX) 75 MG tablet TAKE 1 TABLET BY MOUTH EVERY DAY 90 tablet 2   furosemide (LASIX) 20 MG tablet TAKE 1 TABLET BY MOUTH EVERY DAY 90 tablet 3   gabapentin (NEURONTIN) 100 MG capsule Take 1 capsule (100 mg total) by mouth 3 (three) times daily. 90 capsule 3   hydrocortisone (ANUSOL-HC) 2.5 % rectal cream Place 1 Application rectally 2 (two) times daily. 30 g 2   linaclotide (LINZESS) 72 MCG capsule Take 1 capsule (72 mcg  total) by mouth daily before breakfast. 30 capsule 3   LORazepam (ATIVAN) 0.5 MG tablet 1-2 tabs 30 - 60 min prior to MRI. Do not drive with this medicine. 4 tablet 0   losartan (COZAAR) 50 MG tablet Take 1 tablet (50 mg total) by mouth daily. 90 tablet 3   NIFEdipine (PROCARDIA XL/NIFEDICAL-XL) 90 MG 24 hr tablet TAKE 1 TABLET BY MOUTH EVERY DAY 90 tablet 3   PARoxetine (PAXIL) 10 MG tablet Take 1 tablet (10 mg total) by mouth daily. 90 tablet 3   tiZANidine (ZANAFLEX) 2 MG tablet Take 1 tablet (2 mg total) by mouth every 6 (six) hours as needed for muscle spasms. 30 tablet 2   topiramate (TOPAMAX) 25 MG tablet Take 1 tablet (25 mg total) by mouth 2 (two) times daily. 180 tablet 1   traMADol (ULTRAM) 50 MG tablet Take 1 tablet (50 mg total) by mouth 3 (three) times daily as needed. 60 tablet 2   [DISCONTINUED] fluticasone (FLONASE) 50 MCG/ACT nasal spray Place 2 sprays into both nostrils daily. (Patient taking differently: Place 2 sprays into both nostrils as needed for rhinitis. ) 16 g 6   No current facility-administered medications on file prior to visit.        ROS:  All others reviewed and negative.  Objective        PE:  BP 128/74 (BP Location: Right Arm, Patient Position: Sitting, Cuff Size: Normal)   Pulse 79   Temp 97.9 F (36.6 C) (Oral)   Ht 5\' 2"  (1.575 m)   Wt 103 lb (46.7 kg)   SpO2 96%   BMI 18.84 kg/m                 Constitutional: Pt appears in NAD               HENT: Head: NCAT.                Right Ear: External ear normal.                 Left Ear: External ear normal. Bilat tm's with mild erythema.  Max sinus areas non tender.  Pharynx with mild erythema, no exudate               Eyes: . Pupils are equal, round, and reactive to light. Conjunctivae and EOM are normal               Nose: without d/c or deformity               Neck: Neck supple. Gross normal ROM               Cardiovascular: Normal rate and regular rhythm.  Pulmonary/Chest: Effort  normal and breath sounds without rales or wheezing.                Abd:  Soft, NT, ND, + BS, no organomegaly               Neurological: Pt is alert. At baseline orientation, motor grossly intact               Skin: Skin is warm. No rashes, no other new lesions, LE edema - none               Psychiatric: Pt behavior is normal without agitation but depressed affect  Micro: none  Cardiac tracings I have personally interpreted today:  none  Pertinent Radiological findings (summarize): none   Lab Results  Component Value Date   WBC 10.3 07/27/2023   HGB 9.8 (L) 07/27/2023   HCT 31.6 (L) 07/27/2023   PLT 539.0 (H) 07/27/2023   GLUCOSE 99 07/27/2023   CHOL 149 03/02/2023   TRIG 87.0 03/02/2023   HDL 78.60 03/02/2023   LDLCALC 53 03/02/2023   ALT 17 07/27/2023   AST 30 07/27/2023   NA 138 07/27/2023   K 4.1 07/27/2023   CL 104 07/27/2023   CREATININE 1.43 (H) 07/27/2023   BUN 18 07/27/2023   CO2 25 07/27/2023   TSH 0.37 03/02/2023   INR 1.1 05/24/2019   HGBA1C 5.9 03/02/2023   Assessment/Plan:  Elaine Turner is a 77 y.o. Black or African American [2] female with  has a past medical history of AIN III (anal intraepithelial neoplasia III) (06/26/2015), Aortic atherosclerosis (HCC), Chronic diarrhea, COPD  GOLD 0 / active smoker  (10/24/2015), Diverticulitis, Diverticulosis, Esophageal stricture, Heart murmur, Hemorrhoids, Hyperglycemia (05/01/2013), Hypertension, Ischemic optic neuropathy, left eye (05/25/2019), Pneumonia (05/01/2013), PVD (peripheral vascular disease) (HCC), and Urine incontinence.  Anxiety and depression Mild to mod, for celexa 10 qd,  to f/u any worsening symptoms or concerns  Insomnia Mild to mod, for increased trazodone 100 at bedtime prn, to f/u any worsening symptoms or concerns  Allergic rhinitis Mild to mod, for add nasacort asd, to f/u any worsening symptoms or concerns  Renal mass Etiology unclear, for renal u/s to better evaluate, may need renal  referral  Followup: Return in about 4 months (around 12/24/2023).  Oliver Barre, MD 08/24/2023 9:13 PM [facility] - [facility]

## 2023-08-24 NOTE — Assessment & Plan Note (Signed)
Mild to mod, for celexa 10 qd,  to f/u any worsening symptoms or concerns 

## 2023-08-24 NOTE — Patient Instructions (Addendum)
Please take all new medication as prescribed - the celexa 10  mg per day  Please take all new medication as directed - OTC Nasacort for congestion  Ok to increase the trazodone to 100 mg at bedtime as needed  Please continue all other medications as before, and refills have been done if requested.  Please have the pharmacy call with any other refills you may need.  Please keep your appointments with your specialists as you may have planned  You will be contacted regarding the referral for: kidney ultrasound  Please make an Appointment to return in 4 months, or sooner if needed

## 2023-08-24 NOTE — Assessment & Plan Note (Addendum)
Etiology unclear, for renal u/s to better evaluate, may need renal referral

## 2023-09-03 ENCOUNTER — Other Ambulatory Visit: Payer: Medicare Other

## 2023-09-05 ENCOUNTER — Other Ambulatory Visit: Payer: Self-pay | Admitting: Internal Medicine

## 2023-09-06 ENCOUNTER — Other Ambulatory Visit: Payer: Self-pay

## 2023-10-01 ENCOUNTER — Other Ambulatory Visit: Payer: Self-pay | Admitting: Internal Medicine

## 2023-10-01 ENCOUNTER — Other Ambulatory Visit: Payer: Self-pay

## 2023-11-02 ENCOUNTER — Telehealth: Payer: Self-pay | Admitting: Internal Medicine

## 2023-11-02 NOTE — Telephone Encounter (Signed)
Prescription Request  11/02/2023  LOV: 08/24/2023  What is the name of the medication or equipment? Meloxicam 7.5 mg  Have you contacted your pharmacy to request a refill? Yes   Which pharmacy would you like this sent to?  [id number] [place], [place] - [address] [place] [address] Phone: [phone] Fax: [phone]     Patient notified that their request is being sent to the clinical staff for review and that they should receive a response within 2 business days.   Please advise at Mobile [phone] (mobile)

## 2023-11-02 NOTE — Telephone Encounter (Signed)
Very sorry, we should no longer prescribe the mobic as the kidney function is slow, as we risk making it worse   thanks

## 2023-12-20 ENCOUNTER — Other Ambulatory Visit: Payer: Self-pay | Admitting: Internal Medicine

## 2023-12-20 ENCOUNTER — Other Ambulatory Visit: Payer: Self-pay

## 2023-12-20 DIAGNOSIS — I10 Essential (primary) hypertension: Secondary | ICD-10-CM

## 2023-12-24 ENCOUNTER — Ambulatory Visit: Payer: Medicare Other | Admitting: Internal Medicine

## 2024-03-18 ENCOUNTER — Other Ambulatory Visit: Payer: Self-pay | Admitting: Internal Medicine

## 2024-03-20 ENCOUNTER — Other Ambulatory Visit: Payer: Self-pay

## 2024-03-26 ENCOUNTER — Other Ambulatory Visit: Payer: Self-pay | Admitting: Internal Medicine

## 2024-03-27 ENCOUNTER — Other Ambulatory Visit: Payer: Self-pay

## 2024-04-30 ENCOUNTER — Other Ambulatory Visit: Payer: Self-pay | Admitting: Internal Medicine

## 2024-05-18 ENCOUNTER — Other Ambulatory Visit: Payer: Self-pay | Admitting: Internal Medicine

## 2024-08-16 ENCOUNTER — Telehealth: Payer: Self-pay | Admitting: Internal Medicine

## 2024-08-16 ENCOUNTER — Other Ambulatory Visit: Payer: Self-pay | Admitting: Internal Medicine

## 2024-08-16 MED ORDER — TRAZODONE HCL 100 MG PO TABS: ORAL_TABLET | ORAL | 0 refills | Status: DC

## 2024-08-16 NOTE — Telephone Encounter (Signed)
 Copied from CRM #[id number]. Topic: Clinical - Medication Refill >> Aug 16, 2024 10:10 AM Shereese L wrote: Medication: traZODone  (DESYREL ) 100 MG tablet  Has the patient contacted their pharmacy? Yes (Agent: If no, request that the patient contact the pharmacy for the refill. If patient does not wish to contact the pharmacy document the reason why and proceed with request.) (Agent: If yes, when and what did the pharmacy advise?)  This is the patient's preferred pharmacy:  [phone] [address], [place] - [address] [place] [address] Phone: [phone] Fax: [phone]   Is this the correct pharmacy for this prescription? Yes If no, delete pharmacy and type the correct one.   Has the prescription been filled recently? Yes  Is the patient out of the medication? Yes  Has the patient been seen for an appointment in the last year OR does the patient have an upcoming appointment? Yes  Can we respond through MyChart? Yes  Agent: Please be advised that Rx refills may take up to 3 business days. We ask that you follow-up with your pharmacy.

## 2024-08-16 NOTE — Telephone Encounter (Unsigned)
 Copied from CRM #[id number]. Topic: Clinical - Medication Refill >> Aug 16, 2024 10:10 AM Shereese L wrote: Medication: traZODone  (DESYREL ) 100 MG tablet  Has the patient contacted their pharmacy? Yes (Agent: If no, request that the patient contact the pharmacy for the refill. If patient does not wish to contact the pharmacy document the reason why and proceed with request.) (Agent: If yes, when and what did the pharmacy advise?)  This is the patient's preferred pharmacy:  [phone] [address] [facility], [place] - [address] [place] [address] Phone: [phone] Fax: [phone]   Is this the correct pharmacy for this prescription? Yes If no, delete pharmacy and type the correct one.   Has the prescription been filled recently? Yes  Is the patient out of the medication? Yes  Has the patient been seen for an appointment in the last year OR does the patient have an upcoming appointment? Yes  Can we respond through MyChart? Yes  Agent: Please be advised that Rx refills may take up to 3 business days. We ask that you follow-up with your pharmacy.

## 2024-08-17 ENCOUNTER — Other Ambulatory Visit: Payer: Self-pay | Admitting: Internal Medicine

## 2024-08-17 ENCOUNTER — Ambulatory Visit: Admitting: Internal Medicine

## 2024-08-17 MED ORDER — TRAZODONE HCL 100 MG PO TABS: ORAL_TABLET | ORAL | 0 refills | Status: DC

## 2024-08-17 NOTE — Telephone Encounter (Signed)
 I sent refill for 30 days  Please let pt know she is due for yearly follow up exam for further refills   thanks

## 2024-08-17 NOTE — Telephone Encounter (Signed)
 Called patient to relay Dr. Norleen comment. Phone continuously rang, no message was left.

## 2024-08-21 ENCOUNTER — Other Ambulatory Visit: Payer: Self-pay | Admitting: Internal Medicine

## 2024-08-21 ENCOUNTER — Ambulatory Visit: Payer: Self-pay | Admitting: Internal Medicine

## 2024-08-21 ENCOUNTER — Telehealth: Payer: Self-pay

## 2024-08-21 ENCOUNTER — Encounter: Payer: Self-pay | Admitting: Internal Medicine

## 2024-08-21 ENCOUNTER — Ambulatory Visit
Admission: RE | Admit: 2024-08-21 | Discharge: 2024-08-21 | Disposition: A | Discharge status: Home / Self Care | Source: Ambulatory Visit | Attending: Internal Medicine | Admitting: Internal Medicine

## 2024-08-21 ENCOUNTER — Ambulatory Visit (INDEPENDENT_AMBULATORY_CARE_PROVIDER_SITE_OTHER): Admitting: Internal Medicine

## 2024-08-21 VITALS — BP 178/82 | HR 69 | Temp 98.0°F | Ht 62.0 in | Wt 109.4 lb

## 2024-08-21 DIAGNOSIS — E538 Deficiency of other specified B group vitamins: Secondary | ICD-10-CM

## 2024-08-21 DIAGNOSIS — E559 Vitamin D deficiency, unspecified: Secondary | ICD-10-CM | POA: Diagnosis not present

## 2024-08-21 DIAGNOSIS — E785 Hyperlipidemia, unspecified: Secondary | ICD-10-CM | POA: Diagnosis not present

## 2024-08-21 DIAGNOSIS — Z0001 Encounter for general adult medical examination with abnormal findings: Secondary | ICD-10-CM | POA: Insufficient documentation

## 2024-08-21 DIAGNOSIS — R739 Hyperglycemia, unspecified: Secondary | ICD-10-CM | POA: Diagnosis not present

## 2024-08-21 DIAGNOSIS — K5900 Constipation, unspecified: Secondary | ICD-10-CM

## 2024-08-21 DIAGNOSIS — J069 Acute upper respiratory infection, unspecified: Secondary | ICD-10-CM | POA: Insufficient documentation

## 2024-08-21 DIAGNOSIS — R1030 Lower abdominal pain, unspecified: Secondary | ICD-10-CM

## 2024-08-21 DIAGNOSIS — F17200 Nicotine dependence, unspecified, uncomplicated: Secondary | ICD-10-CM | POA: Diagnosis not present

## 2024-08-21 DIAGNOSIS — I10 Essential (primary) hypertension: Secondary | ICD-10-CM

## 2024-08-21 DIAGNOSIS — F32A Depression, unspecified: Secondary | ICD-10-CM

## 2024-08-21 DIAGNOSIS — M1712 Unilateral primary osteoarthritis, left knee: Secondary | ICD-10-CM | POA: Insufficient documentation

## 2024-08-21 DIAGNOSIS — R350 Frequency of micturition: Secondary | ICD-10-CM | POA: Diagnosis not present

## 2024-08-21 DIAGNOSIS — F419 Anxiety disorder, unspecified: Secondary | ICD-10-CM

## 2024-08-21 DIAGNOSIS — N281 Cyst of kidney, acquired: Secondary | ICD-10-CM | POA: Diagnosis not present

## 2024-08-21 LAB — CBC WITH DIFFERENTIAL/PLATELET
Basophils Absolute: 0.1 K/uL (ref 0.0–0.1)
Basophils Relative: 1 % (ref 0.0–3.0)
Eosinophils Absolute: 0.3 K/uL (ref 0.0–0.7)
Eosinophils Relative: 3 % (ref 0.0–5.0)
HCT: 33.6 % — ABNORMAL LOW (ref 36.0–46.0)
Hemoglobin: 10.4 g/dL — ABNORMAL LOW (ref 12.0–15.0)
Lymphocytes Relative: 15.5 % (ref 12.0–46.0)
Lymphs Abs: 1.5 K/uL (ref 0.7–4.0)
MCHC: 30.9 g/dL (ref 30.0–36.0)
MCV: 74.4 fl — ABNORMAL LOW (ref 78.0–100.0)
Monocytes Absolute: 0.5 K/uL (ref 0.1–1.0)
Monocytes Relative: 5.3 % (ref 3.0–12.0)
Neutro Abs: 7.3 K/uL (ref 1.4–7.7)
Neutrophils Relative %: 75.2 % (ref 43.0–77.0)
Platelets: 435 K/uL — ABNORMAL HIGH (ref 150.0–400.0)
RBC: 4.52 Mil/uL (ref 3.87–5.11)
RDW: 17.9 % — ABNORMAL HIGH (ref 11.5–15.5)
WBC: 9.7 K/uL (ref 4.0–10.5)

## 2024-08-21 LAB — BASIC METABOLIC PANEL WITH GFR
BUN: 34 mg/dL — ABNORMAL HIGH (ref 6–23)
CO2: 24 meq/L (ref 19–32)
Calcium: 8.7 mg/dL (ref 8.4–10.5)
Chloride: 105 meq/L (ref 96–112)
Creatinine, Ser: 1.98 mg/dL — ABNORMAL HIGH (ref 0.40–1.20)
GFR: 23.44 mL/min — ABNORMAL LOW (ref >60.00)
Glucose, Bld: 87 mg/dL (ref 70–99)
Potassium: 4.2 meq/L (ref 3.5–5.1)
Sodium: 139 meq/L (ref 135–145)

## 2024-08-21 LAB — URINALYSIS, ROUTINE W REFLEX MICROSCOPIC
Bilirubin Urine: NEGATIVE
Ketones, ur: NEGATIVE
Leukocytes,Ua: NEGATIVE
Nitrite: NEGATIVE
Specific Gravity, Urine: 1.02 (ref 1.000–1.030)
Total Protein, Urine: >=300 — AB
Urine Glucose: NEGATIVE
Urobilinogen, UA: 0.2 (ref 0.0–1.0)
pH: 6 (ref 5.0–8.0)

## 2024-08-21 LAB — LIPID PANEL
Cholesterol: 188 mg/dL (ref 0–200)
HDL: 94.1 mg/dL (ref >39.00)
LDL Cholesterol: 82 mg/dL (ref 0–99)
NonHDL: 94.18
Total CHOL/HDL Ratio: 2
Triglycerides: 61 mg/dL (ref 0.0–149.0)
VLDL: 12.2 mg/dL (ref 0.0–40.0)

## 2024-08-21 LAB — HEPATIC FUNCTION PANEL
ALT: 10 U/L (ref 0–35)
AST: 16 U/L (ref 0–37)
Albumin: 4.4 g/dL (ref 3.5–5.2)
Alkaline Phosphatase: 74 U/L (ref 39–117)
Bilirubin, Direct: 0.1 mg/dL (ref 0.0–0.3)
Total Bilirubin: 0.4 mg/dL (ref 0.2–1.2)
Total Protein: 7.3 g/dL (ref 6.0–8.3)

## 2024-08-21 LAB — VITAMIN B12: Vitamin B-12: 158 pg/mL — ABNORMAL LOW (ref 211–911)

## 2024-08-21 LAB — VITAMIN D 25 HYDROXY (VIT D DEFICIENCY, FRACTURES): VITD: 15.12 ng/mL — ABNORMAL LOW (ref 30.00–100.00)

## 2024-08-21 LAB — HEMOGLOBIN A1C: Hgb A1c MFr Bld: 5.7 % (ref 4.6–6.5)

## 2024-08-21 LAB — TSH: TSH: 0.47 u[IU]/mL (ref 0.35–5.50)

## 2024-08-21 MED ORDER — SOLIFENACIN SUCCINATE 5 MG PO TABS: 5.0000 mg | ORAL_TABLET | Freq: Every day | ORAL | 3 refills | Status: DC

## 2024-08-21 MED ORDER — PAROXETINE HCL 20 MG PO TABS
20.0000 mg | ORAL_TABLET | Freq: Every day | ORAL | 3 refills | Status: AC
Start: 2024-08-21 — End: ?

## 2024-08-21 MED ORDER — AZITHROMYCIN 250 MG PO TABS: ORAL_TABLET | ORAL | 1 refills | Status: AC

## 2024-08-21 NOTE — Patient Instructions (Signed)
 Please take all new medication as prescribed - the antibiotic  Ok to increase the paxil  to 20 mg per day  Please take all new medication as prescribed- the generic vesicare  5 mg for bladder  Please continue all other medications as before, and refills have been done if requested.  Please have the pharmacy call with any other refills you may need.  Please continue your efforts at being more active, low cholesterol diet, and weight control.  You are otherwise up to date with prevention measures today.  Please keep your appointments with your specialists as you may have planned  You will be contacted regarding the referral for: CT scan abd/pelvis  Please go to the LAB at the blood drawing area for the tests to be done  You will be contacted by phone if any changes need to be made immediately.  Otherwise, you will receive a letter about your results with an explanation, but please check with MyChart first.  Please make an Appointment to return in 6 months, or sooner if needed

## 2024-08-21 NOTE — Assessment & Plan Note (Addendum)
 Age and sex appropriate education and counseling updated with regular exercise and diet Referrals for preventative services - none needed Immunizations addressed - for prevnar 20, flu and tdap at pharmacy Smoking counseling  - pt counsled to quit, pt not ready Evidence for depression or other mood disorder - none significant Most recent labs reviewed. I have personally reviewed and have noted: 1) the patient's medical and social history 2) The patient's current medications and supplements 3) The patient's height, weight, and BMI have been recorded in the chart

## 2024-08-21 NOTE — Assessment & Plan Note (Signed)
 With recent mild worsenig, for miralax 17 gm every day prn

## 2024-08-21 NOTE — Assessment & Plan Note (Signed)
 Lab Results  Component Value Date   LDLCALC 53 03/02/2023   Stable, pt to continue current statin lpitor 40 mg every day, and f/u lab today

## 2024-08-21 NOTE — Assessment & Plan Note (Signed)
 Lab Results  Component Value Date   HGBA1C 5.9 03/02/2023   Stable, pt to continue current medical treatment - diet, wt control

## 2024-08-21 NOTE — Assessment & Plan Note (Signed)
 With mild worsening, ok for increased paxil  20 mg qd

## 2024-08-21 NOTE — Assessment & Plan Note (Addendum)
 Pt with mod to severe pain involving LLQ and left flank as well, differential includes diverticulitis and renal stone vs other - for labs today including cbc, as as well CT abd pelvis

## 2024-08-21 NOTE — Assessment & Plan Note (Signed)
 BP Readings from Last 3 Encounters:  08/21/24 (!) 178/82  08/24/23 128/74  08/11/23 138/68   Uncontrolled, but pt states controlled athome, pt to continue medical treatment losartan  50 mg every day, procardia  xl 90 mg every day as declines other change

## 2024-08-21 NOTE — Progress Notes (Addendum)
 Patient ID: Elaine Turner, female   DOB: November 15, 1946, 78 y.o.   MRN: [id number]         Chief Complaint:: wellness exam and Abdominal Pain (Abdominal pain, unable to pass bowel movement without use of laxative for the past month. Also notes increased frequency in urination and back/flank pain)  , URI, htn, OAB, depression anxiety, left knee arthritis       HPI:  Elaine Turner is a 78 y.o. female here for wellness exam; for prevnar 20, flu and tdap at pharmacy, o/w up to date                        Also  Here with 2-3 days acute onset fever, facial pain, pressure, headache, general weakness and malaise, and greenish d/c, with mild ST and cough, but pt denies chest pain, wheezing, increased sob or doe, orthopnea, PND, increased LE swelling, palpitations, dizziness or syncope. BP has been controlled at home per pt.  Has GU symptoms worsening x 2-3 mo with frequency but also has LLQ pain and left flank pain today, no fever and Denies urinary symptoms such as dysuria, hematuria or n/v, fever, chills.  No hx of gu malignancy or stone. Does also have mild constipation x 1 mo mild intermittent.    . Peak wt has been about 120 in the past. Overall stable to increased recently on paxil , but pt states paxil  no longer helping as much, asks for higher dose.  Denies worsening depressive symptoms, suicidal ideation, or panic; has ongoing anxiety.  Also has left knee arthritic pain mild intermittent without effusion, giveaways or falls.    Wt Readings from Last 3 Encounters:  08/21/24 109 lb 6.4 oz (49.6 kg)  08/24/23 103 lb (46.7 kg)  08/11/23 101 lb 6.4 oz (46 kg)   BP Readings from Last 3 Encounters:  08/21/24 (!) 178/82  08/24/23 128/74  08/11/23 138/68   Immunization History  Administered Date(s) Administered   Fluad Quad(high Dose 65+) 09/27/2019, 02/06/2022   INFLUENZA, HIGH DOSE SEASONAL PF 01/06/2018, 11/11/2018   Influenza-Unspecified 09/29/2020   Pneumococcal Conjugate-13 04/13/2014,  10/04/2015, 09/27/2019   Pneumococcal Polysaccharide-23 03/25/2016   Tdap 08/28/2009   Health Maintenance Due  Topic Date Due   Medicare Annual Wellness (AWV)  Never done   Zoster Vaccines- Shingrix (1 of 2) Never done   DTaP/Tdap/Td (2 - Td or Tdap) 08/29/2019   Lung Cancer Screening  08/12/2024   INFLUENZA VACCINE  07/28/2024      Past Medical History:  Diagnosis Date   AIN III (anal intraepithelial neoplasia III) 06/26/2015   AIN II-III of internal hemorrhoids   Aortic atherosclerosis (HCC)    Chronic diarrhea    COPD  GOLD 0 / active smoker  10/24/2015   Diverticulitis    Diverticulosis    Esophageal stricture    Heart murmur    Hemorrhoids    Hyperglycemia 05/01/2013   Hypertension    Ischemic optic neuropathy, left eye 05/25/2019   Pneumonia 05/01/2013   PVD (peripheral vascular disease) (HCC)    Urine incontinence    Past Surgical History:  Procedure Laterality Date   ABDOMINAL HYSTERECTOMY     CARPAL TUNNEL RELEASE Right 09/2019   HEMORRHOID SURGERY N/A 06/26/2015   Procedure: INTERNAL AND EXTERNAL HEMORRHOIDECTOMY;  Surgeon: Dann Hummer, MD;  Location: [facility];  Service: General;  Laterality: N/A;   LOOP RECORDER INSERTION N/A 05/25/2019   Procedure: LOOP RECORDER INSERTION;  Surgeon: Inocencio,  Soyla Lunger, MD;  Location: [facility];  Service: Cardiovascular;  Laterality: N/A;    reports that she has been smoking cigarettes. She has a 62 pack-year smoking history. She has never used smokeless tobacco. She reports current alcohol use. She reports that she does not use drugs. family history includes Hypertension in her mother; Lung cancer in her father. Allergies  Allergen Reactions   Ace Inhibitors Cough   Levaquin  [Levofloxacin ] Other (See Comments)    GI upset   Current Outpatient Medications on File Prior to Visit  Medication Sig Dispense Refill   albuterol  (VENTOLIN  HFA) 108 (90 Base) MCG/ACT inhaler TAKE 2 PUFFS BY MOUTH EVERY 6  HOURS AS NEEDED FOR WHEEZE OR SHORTNESS OF BREATH 6.7 each 0   amoxicillin -clavulanate (AUGMENTIN ) 875-125 MG tablet Take 1 tablet by mouth 2 (two) times daily. 20 tablet 0   atorvastatin  (LIPITOR) 40 MG tablet TAKE 1 TABLET BY MOUTH DAILY AT 6:00PM 90 tablet 3   bisacodyl (DULCOLAX) 5 MG EC tablet Take 5 mg by mouth daily as needed for moderate constipation. Patient states she takes 6 a day when she is constipated     citalopram  (CELEXA ) 10 MG tablet Take 1 tablet (10 mg total) by mouth daily. 90 tablet 3   clobetasol  cream (TEMOVATE ) 0.05 % Apply 1 Application topically 2 (two) times daily. 30 g 1   clopidogrel  (PLAVIX ) 75 MG tablet TAKE 1 TABLET BY MOUTH EVERY DAY 90 tablet 2   furosemide  (LASIX ) 20 MG tablet TAKE 1 TABLET BY MOUTH EVERY DAY 90 tablet 3   gabapentin  (NEURONTIN ) 100 MG capsule Take 1 capsule (100 mg total) by mouth 3 (three) times daily. 90 capsule 3   hydrocortisone  (ANUSOL -HC) 2.5 % rectal cream Place 1 Application rectally 2 (two) times daily. 30 g 2   linaclotide  (LINZESS ) 72 MCG capsule Take 1 capsule (72 mcg total) by mouth daily before breakfast. 30 capsule 3   LORazepam  (ATIVAN ) 0.5 MG tablet 1-2 tabs 30 - 60 min prior to MRI. Do not drive with this medicine. 4 tablet 0   losartan  (COZAAR ) 50 MG tablet TAKE 1 TABLET BY MOUTH EVERY DAY 90 tablet 3   NIFEdipine  (PROCARDIA  XL/NIFEDICAL-XL) 90 MG 24 hr tablet TAKE 1 TABLET BY MOUTH EVERY DAY 90 tablet 3   tiZANidine  (ZANAFLEX ) 2 MG tablet Take 1 tablet (2 mg total) by mouth every 6 (six) hours as needed for muscle spasms. 30 tablet 2   topiramate  (TOPAMAX ) 25 MG tablet Take 1 tablet (25 mg total) by mouth 2 (two) times daily. 180 tablet 1   traMADol  (ULTRAM ) 50 MG tablet Take 1 tablet (50 mg total) by mouth 3 (three) times daily as needed. 60 tablet 2   traZODone  (DESYREL ) 100 MG tablet TAKE 1 TABLET BY MOUTH EVERYDAY AT BEDTIME 90 tablet 1   [DISCONTINUED] fluticasone  (FLONASE ) 50 MCG/ACT nasal spray Place 2 sprays into both  nostrils daily. (Patient taking differently: Place 2 sprays into both nostrils as needed for rhinitis. ) 16 g 6   No current facility-administered medications on file prior to visit.        ROS:  All others reviewed and negative.  Objective        PE:  BP (!) 178/82   Pulse 69   Temp 98 F (36.7 C)   Ht 5' 2 (1.575 m)   Wt 109 lb 6.4 oz (49.6 kg)   SpO2 98%   BMI 20.01 kg/m  Constitutional: Pt appears mild ill               HENT: Head: NCAT.                Right Ear: External ear normal.                 Left Ear: External ear normal. Bilat tm's with mild erythema.  Max sinus areas mild tender.  Pharynx with mild erythema, no exudate                Eyes: . Pupils are equal, round, and reactive to light. Conjunctivae and EOM are normal               Nose: without d/c or deformity               Neck: Neck supple. Gross normal ROM               Cardiovascular: Normal rate and regular rhythm.                 Pulmonary/Chest: Effort normal and breath sounds without rales or wheezing.                Abd:  Soft, mild to mod LLQ tender, ND, + BS, no organomegaly               Neurological: Pt is alert. At baseline orientation, motor grossly intact               Skin: Skin is warm. No rashes, no other new lesions, LE edema - none               Psychiatric: Pt behavior is normal without agitation , nervous today  Micro: none  Cardiac tracings I have personally interpreted today:  none  Pertinent Radiological findings (summarize): none   Lab Results  Component Value Date   WBC 10.3 07/27/2023   HGB 9.8 (L) 07/27/2023   HCT 31.6 (L) 07/27/2023   PLT 539.0 (H) 07/27/2023   GLUCOSE 99 07/27/2023   CHOL 149 03/02/2023   TRIG 87.0 03/02/2023   HDL 78.60 03/02/2023   LDLCALC 53 03/02/2023   ALT 17 07/27/2023   AST 30 07/27/2023   NA 138 07/27/2023   K 4.1 07/27/2023   CL 104 07/27/2023   CREATININE 1.43 (H) 07/27/2023   BUN 18 07/27/2023   CO2 25 07/27/2023    TSH 0.37 03/02/2023   INR 1.1 05/24/2019   HGBA1C 5.9 03/02/2023   Assessment/Plan:  Elaine Turner is a 78 y.o. Black or African American [2] female with  has a past medical history of AIN III (anal intraepithelial neoplasia III) (06/26/2015), Aortic atherosclerosis (HCC), Chronic diarrhea, COPD  GOLD 0 / active smoker  (10/24/2015), Diverticulitis, Diverticulosis, Esophageal stricture, Heart murmur, Hemorrhoids, Hyperglycemia (05/01/2013), Hypertension, Ischemic optic neuropathy, left eye (05/25/2019), Pneumonia (05/01/2013), PVD (peripheral vascular disease) (HCC), and Urine incontinence.  Encounter for well adult exam with abnormal findings Age and sex appropriate education and counseling updated with regular exercise and diet Referrals for preventative services - none needed Immunizations addressed - for prevnar 20, flu and tdap at pharmacy Smoking counseling  - pt counsled to quit, pt not ready Evidence for depression or other mood disorder - none significant Most recent labs reviewed. I have personally reviewed and have noted: 1) the patient's medical and social history 2) The patient's current medications and supplements 3) The patient's height, weight, and BMI have been recorded  in the chart   Anxiety and depression With mild worsening, ok for increased paxil  20 mg qd  B12 deficiency Lab Results  Component Value Date   VITAMINB12 199 (L) 03/02/2023   Low, tostart oral replacement - b12 1000 mcg qd2  Hyperglycemia Lab Results  Component Value Date   HGBA1C 5.9 03/02/2023   Stable, pt to continue current medical treatment - diet, wt control   Hyperlipidemia Lab Results  Component Value Date   LDLCALC 53 03/02/2023   Stable, pt to continue current statin lpitor 40 mg every day, and f/u lab today   Hypertension BP Readings from Last 3 Encounters:  08/21/24 (!) 178/82  08/24/23 128/74  08/11/23 138/68   Uncontrolled, but pt states controlled athome, pt to  continue medical treatment losartan  50 mg every day, procardia  xl 90 mg every day as declines other change   Vitamin D  deficiency Last vitamin D  Lab Results  Component Value Date   VD25OH 17.49 (L) 03/02/2023   Low, to start oral replacement'  Tobacco dependence Pt counsled to quit, pt not ready  Arthritis of left knee Mild to mod, for otc voltaren gel prn, to f/u any worsening symptoms or concerns  Abdominal pain Pt with mod to severe pain involving LLQ and left flank as well, differential includes diverticulitis and renal stone vs other - for labs today including cbc, as as well CT abd pelvis  Constipation With recent mild worsenig, for miralax 17 gm every day prn  Followup: Return in about 6 months (around 02/21/2025).  Lynwood Rush, MD 08/21/2024 12:27 PM [facility]

## 2024-08-21 NOTE — Assessment & Plan Note (Signed)
 Pt counsled to quit, pt not ready

## 2024-08-21 NOTE — Assessment & Plan Note (Signed)
Mild to mod, for otc voltaren gel prn,  to f/u any worsening symptoms or concerns

## 2024-08-21 NOTE — Assessment & Plan Note (Signed)
 Lab Results  Component Value Date   VITAMINB12 199 (L) 03/02/2023   Low, tostart oral replacement - b12 1000 mcg qd2

## 2024-08-21 NOTE — Assessment & Plan Note (Signed)
 Last vitamin D  Lab Results  Component Value Date   VD25OH 17.49 (L) 03/02/2023   Low, to start oral replacement'

## 2024-08-21 NOTE — Telephone Encounter (Signed)
 Copied from CRM #[id number]. Topic: General - Other >> Aug 21, 2024 11:06 AM Rosina BIRCH wrote: Reason for CRM: Keyara from [facility] called stating the patient need a prior authorization for a CT renal stone study  CB [phone] ext [phone]

## 2024-08-22 ENCOUNTER — Telehealth: Payer: Self-pay

## 2024-08-22 ENCOUNTER — Other Ambulatory Visit (HOSPITAL_COMMUNITY): Payer: Self-pay

## 2024-08-22 ENCOUNTER — Encounter: Payer: Self-pay | Admitting: Internal Medicine

## 2024-08-22 LAB — URINE CULTURE: Result:: NO GROWTH

## 2024-08-22 NOTE — Telephone Encounter (Signed)
 Copied from CRM [id number]. Topic: Clinical - Prescription Issue >> Aug 22, 2024 12:02 PM Franky GRADE wrote: Reason for CRM: Patient is calling because her pharmacy received a prescription for solifenacin  (VESICARE ) 5 MG tablet [[id number]] but they don't have that medication in stock, she would like to know if there is an alternative medication that could be prescribed.

## 2024-08-22 NOTE — Telephone Encounter (Signed)
 Pharmacy Patient Advocate Encounter  Insurance verification completed.   The patient is insured through [employer]   Ran test claim for solifenacin  (VESICARE ) 5mg .  The current No PA needed at this time. There is a DUR reject in place Pharmacy has to call and get override  Please be advised

## 2024-08-29 ENCOUNTER — Other Ambulatory Visit: Payer: Self-pay | Admitting: Internal Medicine

## 2024-08-29 ENCOUNTER — Telehealth: Payer: Self-pay

## 2024-08-29 NOTE — Telephone Encounter (Signed)
 Copied from CRM [id number]. Topic: Clinical - Prescription Issue >> Aug 29, 2024  8:29 AM Willma R wrote: Reason for CRM: Patient is unable to get her prescription for solifenacin  (VESICARE ) 5 MG tablet at [facility], states they do not have it. Would like to change to change prescription for be filled at: [facility] - [place], [place] - [address] [place] [address] Phone: [phone] Fax: [phone]  Patient can be reached at [phone]

## 2024-08-30 ENCOUNTER — Telehealth: Payer: Self-pay | Admitting: Radiology

## 2024-08-30 MED ORDER — FESOTERODINE FUMARATE ER 4 MG PO TB24
4.0000 mg | ORAL_TABLET | Freq: Every day | ORAL | 3 refills | Status: DC
Start: 2024-08-30 — End: 2024-09-14

## 2024-08-30 NOTE — Addendum Note (Signed)
 Addended by: NORLEEN LYNWOOD ORN on: 08/30/2024 05:01 PM   Modules accepted: Orders

## 2024-08-30 NOTE — Telephone Encounter (Signed)
 Copied from CRM [id number]. Topic: Clinical - Prescription Issue >> Aug 29, 2024  8:29 AM Willma R wrote: Reason for CRM: Patient is unable to get her prescription for solifenacin  (VESICARE ) 5 MG tablet at [facility], states they do not have it. Would like to change to change prescription for be filled at: [facility] [address] - [place], [place] - [address] [place] [address] Phone: [phone] Fax: [phone]  Patient can be reached at [phone] >> Aug 30, 2024  8:12 AM Martinique E wrote: Patient questioning if there is an alternative medication she can take as this one is too expensive out-of-pocket. Please call patient and advise.

## 2024-08-30 NOTE — Telephone Encounter (Signed)
 Ok to change the vesicare  to Toviaz   - done erx

## 2024-09-01 NOTE — Telephone Encounter (Signed)
 Attempted to call patient but and was unable to leave a voice message

## 2024-09-04 NOTE — Telephone Encounter (Signed)
 Made second attempt on both phone numbers provided to reach out. Unable to leave a voice message

## 2024-09-14 ENCOUNTER — Encounter: Payer: Self-pay | Admitting: Internal Medicine

## 2024-09-14 ENCOUNTER — Ambulatory Visit (INDEPENDENT_AMBULATORY_CARE_PROVIDER_SITE_OTHER): Admitting: Internal Medicine

## 2024-09-14 ENCOUNTER — Other Ambulatory Visit: Payer: Self-pay | Admitting: Internal Medicine

## 2024-09-14 ENCOUNTER — Telehealth: Payer: Self-pay | Admitting: Radiology

## 2024-09-14 VITALS — BP 138/68 | HR 82 | Temp 98.6°F | Ht 62.0 in | Wt 108.2 lb

## 2024-09-14 DIAGNOSIS — E559 Vitamin D deficiency, unspecified: Secondary | ICD-10-CM | POA: Diagnosis not present

## 2024-09-14 DIAGNOSIS — F17200 Nicotine dependence, unspecified, uncomplicated: Secondary | ICD-10-CM | POA: Diagnosis not present

## 2024-09-14 DIAGNOSIS — J449 Chronic obstructive pulmonary disease, unspecified: Secondary | ICD-10-CM | POA: Diagnosis not present

## 2024-09-14 DIAGNOSIS — J069 Acute upper respiratory infection, unspecified: Secondary | ICD-10-CM

## 2024-09-14 DIAGNOSIS — N3281 Overactive bladder: Secondary | ICD-10-CM

## 2024-09-14 DIAGNOSIS — I10 Essential (primary) hypertension: Secondary | ICD-10-CM | POA: Diagnosis not present

## 2024-09-14 DIAGNOSIS — M542 Cervicalgia: Secondary | ICD-10-CM | POA: Diagnosis not present

## 2024-09-14 MED ORDER — PROMETHAZINE-DM 6.25-15 MG/5ML PO SYRP: 5.0000 mL | ORAL_SOLUTION | Freq: Four times a day (QID) | ORAL | 0 refills | Status: AC | PRN

## 2024-09-14 MED ORDER — TRAMADOL HCL 50 MG PO TABS: 50.0000 mg | ORAL_TABLET | Freq: Four times a day (QID) | ORAL | 0 refills | Status: AC | PRN

## 2024-09-14 MED ORDER — MIRABEGRON ER 25 MG PO TB24: 25.0000 mg | ORAL_TABLET | Freq: Every day | ORAL | 11 refills | Status: AC

## 2024-09-14 MED ORDER — CYCLOBENZAPRINE HCL 5 MG PO TABS: 5.0000 mg | ORAL_TABLET | Freq: Three times a day (TID) | ORAL | 1 refills | Status: AC | PRN

## 2024-09-14 NOTE — Telephone Encounter (Signed)
 Copied from CRM [id number]. Topic: Clinical - Prescription Issue >> Sep 14, 2024  1:34 PM Rea ORN wrote: Reason for CRM: pt stated that cyclobenzaprine  (FLEXERIL ) 5 MG tablet is not covered by insurance and was $100 out of pocket. Pt is asking for something cheaper to be sent to pharmacy

## 2024-09-14 NOTE — Patient Instructions (Addendum)
 Please take all new medication as prescribed - the tramadol  for pain, flexeril  as needed for muscle relaxer  Please take all new medication as prescribed - the Myrbetrix for bladder  Please take all new medication as prescribed - the cough medicine as needed  Please continue all other medications as before, and refills have been done if requested.  Please have the pharmacy call with any other refills you may need.  Please keep your appointments with your specialists as you may have planned  Please make an Appointment to return in Feb 26, or sooner if needed

## 2024-09-14 NOTE — Progress Notes (Signed)
 Patient ID: Elaine Turner, female   DOB: 09-02-1946, 78 y.o.   MRN: [id number]        Chief Complaint: follow up left posterior neck strain, OAB, URI, copd, low vit d, tobacco dependence,       HPI:  Elaine Turner is a 78 y.o. female here overall doing ok,   Here with 2-3 days acute onset fever, facial pain, pressure, headache, general weakness and malaise, and clear d/c, with mild ST and cough, but pt denies chest pain, wheezing, increased sob or doe, orthopnea, PND, increased LE swelling, palpitations, dizziness or syncope..   Pt denies polydipsia, polyuria, or new focal neuro s/s.    Pt denies  wt loss, night sweats, loss of appetite, or other constitutional symptoms.  Unfortunately had urinary retention with toviaz  first does, could not take further.  Still with urinary frequency, Denies urinary symptoms such as dysuria,, urgency, flank pain, hematuria or n/v, fever, chills. She is not sure if she slept wrong, but now with mod to severe left post neck pain with radiation to left upper back only.  Still smoking, not ready to quit       Wt Readings from Last 3 Encounters:  09/14/24 108 lb 3.2 oz (49.1 kg)  08/21/24 109 lb 6.4 oz (49.6 kg)  08/24/23 103 lb (46.7 kg)   BP Readings from Last 3 Encounters:  09/14/24 138/68  08/21/24 (!) 178/82  08/24/23 128/74         Past Medical History:  Diagnosis Date   AIN III (anal intraepithelial neoplasia III) 06/26/2015   AIN II-III of internal hemorrhoids   Aortic atherosclerosis (HCC)    Chronic diarrhea    COPD  GOLD 0 / active smoker  10/24/2015   Diverticulitis    Diverticulosis    Esophageal stricture    Heart murmur    Hemorrhoids    Hyperglycemia 05/01/2013   Hypertension    Ischemic optic neuropathy, left eye 05/25/2019   Pneumonia 05/01/2013   PVD (peripheral vascular disease) (HCC)    Urine incontinence    Past Surgical History:  Procedure Laterality Date   ABDOMINAL HYSTERECTOMY     CARPAL TUNNEL RELEASE Right 09/2019    HEMORRHOID SURGERY N/A 06/26/2015   Procedure: INTERNAL AND EXTERNAL HEMORRHOIDECTOMY;  Surgeon: Dann Hummer, MD;  Location: [facility];  Service: General;  Laterality: N/A;   LOOP RECORDER INSERTION N/A 05/25/2019   Procedure: LOOP RECORDER INSERTION;  Surgeon: Inocencio Soyla Lunger, MD;  Location: [facility];  Service: Cardiovascular;  Laterality: N/A;    reports that she has been smoking cigarettes. She has a 62 pack-year smoking history. She has never used smokeless tobacco. She reports current alcohol use. She reports that she does not use drugs. family history includes Hypertension in her mother; Lung cancer in her father. Allergies  Allergen Reactions   Ace Inhibitors Cough   Levaquin  [Levofloxacin ] Other (See Comments)    GI upset   Current Outpatient Medications on File Prior to Visit  Medication Sig Dispense Refill   albuterol  (VENTOLIN  HFA) 108 (90 Base) MCG/ACT inhaler TAKE 2 PUFFS BY MOUTH EVERY 6 HOURS AS NEEDED FOR WHEEZE OR SHORTNESS OF BREATH 6.7 each 0   amoxicillin -clavulanate (AUGMENTIN ) 875-125 MG tablet Take 1 tablet by mouth 2 (two) times daily. 20 tablet 0   atorvastatin  (LIPITOR) 40 MG tablet TAKE 1 TABLET BY MOUTH DAILY AT 6:00PM 90 tablet 3   bisacodyl (DULCOLAX) 5 MG EC tablet Take 5 mg by  mouth daily as needed for moderate constipation. Patient states she takes 6 a day when she is constipated     citalopram  (CELEXA ) 10 MG tablet TAKE 1 TABLET BY MOUTH EVERY DAY 90 tablet 3   clobetasol  cream (TEMOVATE ) 0.05 % Apply 1 Application topically 2 (two) times daily. 30 g 1   clopidogrel  (PLAVIX ) 75 MG tablet TAKE 1 TABLET BY MOUTH EVERY DAY 90 tablet 2   furosemide  (LASIX ) 20 MG tablet TAKE 1 TABLET BY MOUTH EVERY DAY 90 tablet 3   gabapentin  (NEURONTIN ) 100 MG capsule Take 1 capsule (100 mg total) by mouth 3 (three) times daily. 90 capsule 3   hydrocortisone  (ANUSOL -HC) 2.5 % rectal cream Place 1 Application rectally 2 (two) times daily. 30 g 2    linaclotide  (LINZESS ) 72 MCG capsule Take 1 capsule (72 mcg total) by mouth daily before breakfast. 30 capsule 3   LORazepam  (ATIVAN ) 0.5 MG tablet 1-2 tabs 30 - 60 min prior to MRI. Do not drive with this medicine. 4 tablet 0   losartan  (COZAAR ) 50 MG tablet TAKE 1 TABLET BY MOUTH EVERY DAY 90 tablet 3   NIFEdipine  (PROCARDIA  XL/NIFEDICAL-XL) 90 MG 24 hr tablet TAKE 1 TABLET BY MOUTH EVERY DAY 90 tablet 3   PARoxetine  (PAXIL ) 20 MG tablet Take 1 tablet (20 mg total) by mouth daily. 90 tablet 3   tiZANidine  (ZANAFLEX ) 2 MG tablet Take 1 tablet (2 mg total) by mouth every 6 (six) hours as needed for muscle spasms. 30 tablet 2   topiramate  (TOPAMAX ) 25 MG tablet Take 1 tablet (25 mg total) by mouth 2 (two) times daily. 180 tablet 1   traZODone  (DESYREL ) 100 MG tablet TAKE 1 TABLET BY MOUTH EVERYDAY AT BEDTIME 90 tablet 1   [DISCONTINUED] fluticasone  (FLONASE ) 50 MCG/ACT nasal spray Place 2 sprays into both nostrils daily. (Patient taking differently: Place 2 sprays into both nostrils as needed for rhinitis. ) 16 g 6   No current facility-administered medications on file prior to visit.        ROS:  All others reviewed and negative.  Objective        PE:  BP 138/68   Pulse 82   Temp 98.6 F (37 C)   Ht 5' 2 (1.575 m)   Wt 108 lb 3.2 oz (49.1 kg)   SpO2 95%   BMI 19.79 kg/m                 Constitutional: Pt appears in NAD               HENT: Head: NCAT.                Right Ear: External ear normal.                 Left Ear: External ear normal. Bilat tm's with mild erythema.  Max sinus areas non tender.  Pharynx with mild erythema, no exudate               Eyes: . Pupils are equal, round, and reactive to light. Conjunctivae and EOM are normal               Nose: without d/c or deformity               Neck: Neck supple. Gross normal ROM               Cardiovascular: Normal rate and regular rhythm.  Pulmonary/Chest: Effort normal and breath sounds without rales or  wheezing.                Abd:  Soft, NT, ND, + BS, no organomegaly               Neurological: Pt is alert. At baseline orientation, motor grossly intact               Skin: Skin is warm. No rashes, no other new lesions, LE edema - none;  left post neck with tender musclular trapezoid area               Psychiatric: Pt behavior is normal without agitation   Micro: none  Cardiac tracings I have personally interpreted today:  none  Pertinent Radiological findings (summarize): none   Lab Results  Component Value Date   WBC 9.7 08/21/2024   HGB 10.4 (L) 08/21/2024   HCT 33.6 (L) 08/21/2024   PLT 435.0 (H) 08/21/2024   GLUCOSE 87 08/21/2024   CHOL 188 08/21/2024   TRIG 61.0 08/21/2024   HDL 94.10 08/21/2024   LDLCALC 82 08/21/2024   ALT 10 08/21/2024   AST 16 08/21/2024   NA 139 08/21/2024   K 4.2 08/21/2024   CL 105 08/21/2024   CREATININE 1.98 (H) 08/21/2024   BUN 34 (H) 08/21/2024   CO2 24 08/21/2024   TSH 0.47 08/21/2024   INR 1.1 05/24/2019   HGBA1C 5.7 08/21/2024   Assessment/Plan:  Elaine Turner is a 78 y.o. Black or African American [2] female with  has a past medical history of AIN III (anal intraepithelial neoplasia III) (06/26/2015), Aortic atherosclerosis (HCC), Chronic diarrhea, COPD  GOLD 0 / active smoker  (10/24/2015), Diverticulitis, Diverticulosis, Esophageal stricture, Heart murmur, Hemorrhoids, Hyperglycemia (05/01/2013), Hypertension, Ischemic optic neuropathy, left eye (05/25/2019), Pneumonia (05/01/2013), PVD (peripheral vascular disease) (HCC), and Urine incontinence.  COPD  GOLD 0 / active smoker  Stable ,continue inhaler prn  Vitamin D  deficiency Last vitamin D  Lab Results  Component Value Date   VD25OH 15.12 (L) 08/21/2024   Low, to start oral replacement   Tobacco dependence Pt counsled to quit, pt not ready  Hypertension BP Readings from Last 3 Encounters:  09/14/24 138/68  08/21/24 (!) 178/82  08/24/23 128/74   Stable, pt to continue  medical treatment losartan  50 mg every day, procardia  xl 90 mg qd   OAB (overactive bladder) Mild to mod worwsening, for change toviaz  to myrbeterix 25 mg every day as toviaz  not covered by insurance.  Upper respiratory infection C/w viral illness it seems, for prom DM prn cough  Posterior neck pain Left sided msk strain ist seems, for tramadol  prn, and muscle relaxer prn  Followup: No follow-ups on file.  Lynwood Rush, MD 09/16/2024 8:28 PM [facility]

## 2024-09-16 DIAGNOSIS — M542 Cervicalgia: Secondary | ICD-10-CM | POA: Insufficient documentation

## 2024-09-16 DIAGNOSIS — J069 Acute upper respiratory infection, unspecified: Secondary | ICD-10-CM | POA: Insufficient documentation

## 2024-09-16 DIAGNOSIS — N3281 Overactive bladder: Secondary | ICD-10-CM | POA: Insufficient documentation

## 2024-09-16 NOTE — Assessment & Plan Note (Signed)
 Left sided msk strain ist seems, for tramadol  prn, and muscle relaxer prn

## 2024-09-16 NOTE — Assessment & Plan Note (Signed)
 Pt counsled to quit, pt not ready

## 2024-09-16 NOTE — Assessment & Plan Note (Signed)
 BP Readings from Last 3 Encounters:  09/14/24 138/68  08/21/24 (!) 178/82  08/24/23 128/74   Stable, pt to continue medical treatment losartan  50 mg every day, procardia  xl 90 mg qd

## 2024-09-16 NOTE — Assessment & Plan Note (Signed)
 Stable, continue inhaler prn

## 2024-09-16 NOTE — Assessment & Plan Note (Signed)
 C/w viral illness it seems, for prom DM prn cough

## 2024-09-16 NOTE — Assessment & Plan Note (Signed)
 Mild to mod worwsening, for change toviaz  to myrbeterix 25 mg every day as toviaz  not covered by insurance.

## 2024-09-16 NOTE — Assessment & Plan Note (Signed)
 Last vitamin D  Lab Results  Component Value Date   VD25OH 15.12 (L) 08/21/2024   Low, to start oral replacement

## 2024-09-21 MED ORDER — METHOCARBAMOL 500 MG PO TABS: 500.0000 mg | ORAL_TABLET | Freq: Three times a day (TID) | ORAL | 1 refills | Status: AC | PRN

## 2024-09-21 NOTE — Telephone Encounter (Signed)
 Ok for change flexeril  to robaxin  - done erx

## 2024-09-21 NOTE — Telephone Encounter (Signed)
 Called and notified pt of med switch, pt verbalized understanding

## 2024-09-21 NOTE — Addendum Note (Signed)
 Addended by: NORLEEN LYNWOOD ORN on: 09/21/2024 10:06 AM   Modules accepted: Orders

## 2024-10-12 ENCOUNTER — Ambulatory Visit (INDEPENDENT_AMBULATORY_CARE_PROVIDER_SITE_OTHER): Admitting: Internal Medicine

## 2024-10-12 ENCOUNTER — Encounter: Payer: Self-pay | Admitting: Internal Medicine

## 2024-10-12 VITALS — BP 124/68 | HR 68 | Ht 62.0 in

## 2024-10-12 DIAGNOSIS — R109 Unspecified abdominal pain: Secondary | ICD-10-CM | POA: Diagnosis not present

## 2024-10-12 DIAGNOSIS — K581 Irritable bowel syndrome with constipation: Secondary | ICD-10-CM

## 2024-10-12 DIAGNOSIS — K5900 Constipation, unspecified: Secondary | ICD-10-CM

## 2024-10-12 DIAGNOSIS — R140 Abdominal distension (gaseous): Secondary | ICD-10-CM | POA: Diagnosis not present

## 2024-10-12 NOTE — Patient Instructions (Signed)
 You have been given some samples of Linzess  72mcg.  Please follow up as needed.  _______________________________________________________  If your blood pressure at your visit was 140/90 or greater, please contact your primary care physician to follow up on this.  _______________________________________________________  If you are age 80 or older, your body mass index should be between 23-30. Your Body mass index is 19.79 kg/m. If this is out of the aforementioned range listed, please consider follow up with your Primary Care Provider.  If you are age 39 or younger, your body mass index should be between 19-25. Your Body mass index is 19.79 kg/m. If this is out of the aformentioned range listed, please consider follow up with your Primary Care Provider.   ________________________________________________________  The [facility] providers would like to encourage you to use MYCHART to communicate with providers for non-urgent requests or questions.  Due to long hold times on the telephone, sending your provider a message by [facility] may be a faster and more efficient way to get a response.  Please allow 48 business hours for a response.  Please remember that this is for non-urgent requests.  _______________________________________________________  [facility] is using a team-based approach to care.  Your team is made up of your doctor and two to three APPS. Our APPS (Nurse Practitioners and Physician Assistants) work with your physician to ensure care continuity for you. They are fully qualified to address your health concerns and develop a treatment plan. They communicate directly with your gastroenterologist to care for you. Seeing the Advanced Practice Practitioners on your physician's team can help you by facilitating care more promptly, often allowing for earlier appointments, access to diagnostic testing, procedures, and other specialty referrals.

## 2024-10-12 NOTE — Progress Notes (Signed)
 HISTORY OF PRESENT ILLNESS:  Elaine Turner is a 78 y.o. female with multiple medical problems including chronic tobacco use who presents today regarding chronic constipation.  Has been evaluated for this problem many times.  Has been using MiraLAX on demand.  When she is constipated, her abdomen is bloated and uncomfortable.  Good bowel movements help.  She tells me that she took MiraLAX for 1 week.  This did not help.  She did say that Linzess  72 mcg was helpful.  However, she cannot afford the medication.  Her weight has been stable.  Blood work from August 21, 2024 revealed chronic stable anemia for many years prior CT November 2021 for abdominal pain revealed diverticulosis.  No other abnormalities.  Last colonoscopy November 2021.  Exam was negative except for diverticulosis  REVIEW OF SYSTEMS:  All non-GI ROS negative except for arthritis, back pain, muscle cramps, sleeping problems, ankle swelling, excessive urination, urinary leakage  Past Medical History:  Diagnosis Date   AIN III (anal intraepithelial neoplasia III) 06/26/2015   AIN II-III of internal hemorrhoids   Aortic atherosclerosis    Chronic diarrhea    COPD  GOLD 0 / active smoker  10/24/2015   Diverticulitis    Diverticulosis    Esophageal stricture    Heart murmur    Hemorrhoids    Hyperglycemia 05/01/2013   Hypertension    Ischemic optic neuropathy, left eye 05/25/2019   Pneumonia 05/01/2013   PVD (peripheral vascular disease)    Urine incontinence     Past Surgical History:  Procedure Laterality Date   ABDOMINAL HYSTERECTOMY     CARPAL TUNNEL RELEASE Right 09/2019   HEMORRHOID SURGERY N/A 06/26/2015   Procedure: INTERNAL AND EXTERNAL HEMORRHOIDECTOMY;  Surgeon: Dann Hummer, MD;  Location: [facility];  Service: General;  Laterality: N/A;   LOOP RECORDER INSERTION N/A 05/25/2019   Procedure: LOOP RECORDER INSERTION;  Surgeon: Inocencio Soyla Lunger, MD;  Location: [facility];  Service:  Cardiovascular;  Laterality: N/A;    Social History Purva S Guilmette  reports that she has been smoking cigarettes. She has a 62 pack-year smoking history. She has never used smokeless tobacco. She reports current alcohol use. She reports that she does not use drugs.  family history includes Hypertension in her mother; Lung cancer in her father.  Allergies  Allergen Reactions   Ace Inhibitors Cough   Levaquin  [Levofloxacin ] Other (See Comments)    GI upset       PHYSICAL EXAMINATION: Vital signs: BP 124/68   Pulse 68   Ht 5' 2 (1.575 m)   BMI 19.79 kg/m   Constitutional: generally well-appearing, no acute distress Psychiatric: alert and oriented x3, cooperative Eyes: extraocular movements intact, anicteric, conjunctiva pink Mouth: oral pharynx moist, no lesions Neck: supple no lymphadenopathy Cardiovascular: heart regular rate and rhythm, no murmur Lungs: clear to auscultation bilaterally Abdomen: soft, nontender, nondistended, no obvious ascites, no peritoneal signs, normal bowel sounds, no organomegaly Rectal: Omitted Extremities: no clubbing or cyanosis.  1+ lower extremity edema on the left Skin: no lesions on visible extremities Neuro: No focal deficits.  Cranial nerves intact  ASSESSMENT:  1.  Chronic constipation.  Linzess  helps..  Cannot afford 2.  Abdominal discomfort and bloating secondary to constipation 3.  Colonoscopy November 2021 to evaluate abdominal pain, microcytic anemia, constipation was negative except for diverticulosis 4.  Multiple general medical problems   PLAN:  1.  Patient was provided with multiple samples of Linzess  72 mcg daily.  She can take as needed.  Effects and side effects reviewed 2.  Increase water and fiber 3.  Resume care with Carol Theys Total time of 30 minutes was spent preparing to see the patient, obtaining comprehensive history, performing medically appropriate physical exam, counseling and educating the patient regarding the  above listed issues, providing medication, reviewing the side effects, and documenting clinical information in the health record

## 2024-10-24 ENCOUNTER — Telehealth: Payer: Self-pay

## 2024-10-24 ENCOUNTER — Other Ambulatory Visit: Payer: Self-pay | Admitting: Internal Medicine

## 2024-10-24 MED ORDER — CLOBETASOL PROPIONATE 0.05 % EX CREA: 1.0000 | TOPICAL_CREAM | Freq: Two times a day (BID) | CUTANEOUS | 1 refills | Status: AC

## 2024-10-24 NOTE — Telephone Encounter (Signed)
 Copied from CRM [id number]. Topic: Clinical - Medication Refill >> Oct 24, 2024  2:44 PM Roselie C wrote: Medication: clobetasol  cream (TEMOVATE ) 0.05 % [[id number]]  Has the patient contacted their pharmacy? Yes (Agent: If no, request that the patient contact the pharmacy for the refill. If patient does not wish to contact the pharmacy document the reason why and proceed with request.) (Agent: If yes, when and what did the pharmacy advise?)  This is the patient's preferred pharmacy:  [facility] [phone] [address], [place] - [address] [place] [address] Phone: [phone] Fax: [phone]  5  Is this the correct pharmacy for this prescription? Yes If no, delete pharmacy and type the correct one.   Has the prescription been filled recently? No  Is the patient out of the medication? Yes  Has the patient been seen for an appointment in the last year OR does the patient have an upcoming appointment? Yes  Can we respond through MyChart? Yes  Agent: Please be advised that Rx refills may take up to 3 business days. We ask that you follow-up with your pharmacy.

## 2024-10-24 NOTE — Telephone Encounter (Unsigned)
 Copied from CRM [id number]. Topic: Referral - Question >> Oct 24, 2024  2:36 PM Roselie BROCKS wrote: Reason for CRM: Patient called to get information on name and number of dermatologist referred to her. I show  [facility], [address], [place], [place] [address] Phone: [phone] Patient states she called that number and was told it was wrong number. Patient request a call back to confirm referral location .

## 2024-10-24 NOTE — Telephone Encounter (Signed)
 Called and unable to leave voicemail

## 2024-11-10 ENCOUNTER — Telehealth: Payer: Self-pay | Admitting: Internal Medicine

## 2024-11-10 NOTE — Telephone Encounter (Signed)
 Patient requesting prescription for linzess . Please advise.

## 2024-11-13 MED ORDER — LINACLOTIDE 72 MCG PO CAPS: 72.0000 ug | ORAL_CAPSULE | Freq: Every day | ORAL | 3 refills | Status: AC

## 2024-11-13 NOTE — Telephone Encounter (Signed)
Linzess refilled 

## 2024-11-27 ENCOUNTER — Telehealth: Payer: Self-pay | Admitting: Internal Medicine

## 2024-11-27 NOTE — Telephone Encounter (Signed)
 Patient called stating that Linzess  is too expensive. Requesting to know if there is an alternative medication she is able to take. Please advise, thank you

## 2024-11-29 NOTE — Telephone Encounter (Signed)
 No answer, no voicemail on home phone.  Voicemail full on cell phone could not leave a message.

## 2024-12-01 NOTE — Telephone Encounter (Signed)
 Okay to switch?

## 2024-12-01 NOTE — Telephone Encounter (Signed)
 Spoke with patient and suggested that she call her insurance company to see what medications they cover;  she stated she has already done that and they were not helpful.  I told her I will call her pharmacy this afternoon and see if I can figure out what might be covered and then call her back.   She agreed

## 2024-12-01 NOTE — Telephone Encounter (Signed)
 Linzess  is too expensive for patient so I did some research and it appears that Amitiza 8mcg will be more affordable.  Is it ok to switch her to this if she is agreeable?

## 2024-12-04 MED ORDER — LUBIPROSTONE 8 MCG PO CAPS: 8.0000 ug | ORAL_CAPSULE | Freq: Two times a day (BID) | ORAL | 0 refills | Status: AC

## 2024-12-04 NOTE — Telephone Encounter (Signed)
 Called patient to let her know I was going to send in Amitiza  , a more affordable option.  Patient agreed

## 2025-01-02 ENCOUNTER — Telehealth: Payer: Self-pay

## 2025-01-02 NOTE — Telephone Encounter (Signed)
 Copied from CRM [id number]. Topic: Appointments - Transfer of Care >> Jan 01, 2025 12:39 PM Zy'onna H wrote: Pt is requesting to transfer FROM: Norleen Lynwood ORN, MD Pt is requesting to transfer TO: Any Available Provider within [facility] Reason for requested transfer: current provider is leaving the practice.  It is the responsibility of the team the patient would like to transfer to (Dr. Any available PCP) to reach out to the patient if for any reason this transfer is not acceptable.

## 2025-02-22 ENCOUNTER — Ambulatory Visit: Admitting: Internal Medicine
